# Patient Record
Sex: Female | Born: 1935 | Race: White | Hispanic: No | Marital: Married | Smoking: Never smoker
Health system: Southern US, Community
[De-identification: ages and names within clinical notes are randomized; demographics above are authoritative.]

## PROBLEM LIST (undated history)

## (undated) DIAGNOSIS — R202 Paresthesia of skin: Secondary | ICD-10-CM

## (undated) DIAGNOSIS — Z8719 Personal history of other diseases of the digestive system: Secondary | ICD-10-CM

## (undated) DIAGNOSIS — D500 Iron deficiency anemia secondary to blood loss (chronic): Secondary | ICD-10-CM

## (undated) DIAGNOSIS — I10 Essential (primary) hypertension: Secondary | ICD-10-CM

## (undated) DIAGNOSIS — K31819 Angiodysplasia of stomach and duodenum without bleeding: Secondary | ICD-10-CM

## (undated) DIAGNOSIS — I2510 Atherosclerotic heart disease of native coronary artery without angina pectoris: Secondary | ICD-10-CM

## (undated) DIAGNOSIS — A0472 Enterocolitis due to Clostridium difficile, not specified as recurrent: Secondary | ICD-10-CM

## (undated) DIAGNOSIS — Z860100 Personal history of colon polyps, unspecified: Secondary | ICD-10-CM

## (undated) DIAGNOSIS — L719 Rosacea, unspecified: Secondary | ICD-10-CM

## (undated) DIAGNOSIS — R7989 Other specified abnormal findings of blood chemistry: Secondary | ICD-10-CM

## (undated) DIAGNOSIS — N182 Chronic kidney disease, stage 2 (mild): Secondary | ICD-10-CM

## (undated) DIAGNOSIS — M797 Fibromyalgia: Secondary | ICD-10-CM

## (undated) DIAGNOSIS — G8929 Other chronic pain: Secondary | ICD-10-CM

## (undated) DIAGNOSIS — N6091 Unspecified benign mammary dysplasia of right breast: Secondary | ICD-10-CM

## (undated) DIAGNOSIS — K219 Gastro-esophageal reflux disease without esophagitis: Secondary | ICD-10-CM

## (undated) DIAGNOSIS — N809 Endometriosis, unspecified: Secondary | ICD-10-CM

## (undated) DIAGNOSIS — R0600 Dyspnea, unspecified: Secondary | ICD-10-CM

## (undated) DIAGNOSIS — G4700 Insomnia, unspecified: Secondary | ICD-10-CM

## (undated) DIAGNOSIS — Z9861 Coronary angioplasty status: Secondary | ICD-10-CM

## (undated) DIAGNOSIS — I219 Acute myocardial infarction, unspecified: Secondary | ICD-10-CM

## (undated) DIAGNOSIS — M1990 Unspecified osteoarthritis, unspecified site: Secondary | ICD-10-CM

## (undated) DIAGNOSIS — M3500 Sicca syndrome, unspecified: Secondary | ICD-10-CM

## (undated) DIAGNOSIS — I498 Other specified cardiac arrhythmias: Secondary | ICD-10-CM

## (undated) DIAGNOSIS — R002 Palpitations: Secondary | ICD-10-CM

## (undated) DIAGNOSIS — E785 Hyperlipidemia, unspecified: Secondary | ICD-10-CM

## (undated) DIAGNOSIS — G629 Polyneuropathy, unspecified: Secondary | ICD-10-CM

## (undated) DIAGNOSIS — K5792 Diverticulitis of intestine, part unspecified, without perforation or abscess without bleeding: Secondary | ICD-10-CM

## (undated) DIAGNOSIS — I7300 Raynaud's syndrome without gangrene: Secondary | ICD-10-CM

## (undated) DIAGNOSIS — M549 Dorsalgia, unspecified: Secondary | ICD-10-CM

## (undated) DIAGNOSIS — R9439 Abnormal result of other cardiovascular function study: Secondary | ICD-10-CM

## (undated) DIAGNOSIS — I214 Non-ST elevation (NSTEMI) myocardial infarction: Secondary | ICD-10-CM

## (undated) DIAGNOSIS — E559 Vitamin D deficiency, unspecified: Secondary | ICD-10-CM

## (undated) HISTORY — DX: Atherosclerotic heart disease of native coronary artery without angina pectoris: I25.10

## (undated) HISTORY — DX: Angiodysplasia of stomach and duodenum without bleeding: K31.819

## (undated) HISTORY — PX: APPENDECTOMY: SHX54

## (undated) HISTORY — DX: Unspecified osteoarthritis, unspecified site: M19.90

## (undated) HISTORY — DX: Coronary angioplasty status: Z98.61

## (undated) HISTORY — DX: Endometriosis, unspecified: N80.9

## (undated) HISTORY — PX: HERNIA REPAIR: SHX51

## (undated) HISTORY — PX: EYE SURGERY: SHX253

## (undated) HISTORY — DX: Essential (primary) hypertension: I10

## (undated) HISTORY — DX: Paresthesia of skin: R20.2

## (undated) HISTORY — DX: Unspecified benign mammary dysplasia of right breast: N60.91

## (undated) HISTORY — DX: Non-ST elevation (NSTEMI) myocardial infarction: I21.4

## (undated) HISTORY — DX: Enterocolitis due to Clostridium difficile, not specified as recurrent: A04.72

## (undated) HISTORY — PX: BREAST EXCISIONAL BIOPSY: SUR124

## (undated) HISTORY — DX: Hyperlipidemia, unspecified: E78.5

## (undated) HISTORY — DX: Raynaud's syndrome without gangrene: I73.00

## (undated) HISTORY — DX: Iron deficiency anemia secondary to blood loss (chronic): D50.0

## (undated) HISTORY — DX: Sjogren syndrome, unspecified: M35.00

## (undated) HISTORY — DX: Diverticulitis of intestine, part unspecified, without perforation or abscess without bleeding: K57.92

## (undated) HISTORY — DX: Rosacea, unspecified: L71.9

## (undated) HISTORY — DX: Other specified cardiac arrhythmias: I49.8

## (undated) HISTORY — DX: Personal history of colonic polyps: Z86.010

## (undated) HISTORY — DX: Gastro-esophageal reflux disease without esophagitis: K21.9

## (undated) HISTORY — PX: ABDOMINAL HYSTERECTOMY: SHX81

## (undated) HISTORY — DX: Palpitations: R00.2

## (undated) HISTORY — PX: LAPAROSCOPIC CHOLECYSTECTOMY: SUR755

## (undated) HISTORY — DX: Polyneuropathy, unspecified: G62.9

## (undated) HISTORY — DX: Vitamin D deficiency, unspecified: E55.9

## (undated) HISTORY — DX: Personal history of colon polyps, unspecified: Z86.0100

## (undated) HISTORY — PX: TEAR DUCT PROBING: SHX793

## (undated) HISTORY — DX: Chronic kidney disease, stage 2 (mild): N18.2

---

## 1997-09-26 ENCOUNTER — Ambulatory Visit (HOSPITAL_COMMUNITY): Admission: RE | Admit: 1997-09-26 | Discharge: 1997-09-26 | Payer: Self-pay | Admitting: *Deleted

## 1999-11-21 ENCOUNTER — Encounter: Payer: Self-pay | Admitting: Family Medicine

## 1999-11-21 ENCOUNTER — Ambulatory Visit (HOSPITAL_COMMUNITY): Admission: RE | Admit: 1999-11-21 | Discharge: 1999-11-21 | Payer: Self-pay | Admitting: Family Medicine

## 1999-11-27 ENCOUNTER — Encounter: Payer: Self-pay | Admitting: Family Medicine

## 1999-11-27 ENCOUNTER — Encounter: Admission: RE | Admit: 1999-11-27 | Discharge: 1999-11-27 | Payer: Self-pay | Admitting: Family Medicine

## 2000-04-21 ENCOUNTER — Ambulatory Visit (HOSPITAL_COMMUNITY): Admission: RE | Admit: 2000-04-21 | Discharge: 2000-04-21 | Payer: Self-pay | Admitting: Family Medicine

## 2000-04-21 ENCOUNTER — Encounter: Payer: Self-pay | Admitting: Family Medicine

## 2000-10-07 ENCOUNTER — Encounter: Payer: Self-pay | Admitting: Family Medicine

## 2000-10-07 ENCOUNTER — Encounter: Admission: RE | Admit: 2000-10-07 | Discharge: 2000-10-07 | Payer: Self-pay | Admitting: Family Medicine

## 2000-10-29 ENCOUNTER — Encounter: Admission: RE | Admit: 2000-10-29 | Discharge: 2000-10-29 | Payer: Self-pay | Admitting: Family Medicine

## 2000-10-29 ENCOUNTER — Encounter: Payer: Self-pay | Admitting: Family Medicine

## 2001-05-19 ENCOUNTER — Encounter: Admission: RE | Admit: 2001-05-19 | Discharge: 2001-05-19 | Payer: Self-pay

## 2003-01-06 ENCOUNTER — Ambulatory Visit (HOSPITAL_COMMUNITY): Admission: RE | Admit: 2003-01-06 | Discharge: 2003-01-06 | Payer: Self-pay | Admitting: Gastroenterology

## 2003-03-18 HISTORY — PX: HIATAL HERNIA REPAIR: SHX195

## 2003-05-29 ENCOUNTER — Encounter: Admission: RE | Admit: 2003-05-29 | Discharge: 2003-05-29 | Payer: Self-pay | Admitting: Gastroenterology

## 2003-09-26 ENCOUNTER — Other Ambulatory Visit: Admission: RE | Admit: 2003-09-26 | Discharge: 2003-09-26 | Payer: Self-pay | Admitting: Family Medicine

## 2003-11-07 ENCOUNTER — Ambulatory Visit (HOSPITAL_COMMUNITY): Admission: RE | Admit: 2003-11-07 | Discharge: 2003-11-07 | Payer: Self-pay | Admitting: Internal Medicine

## 2004-03-17 HISTORY — PX: CARDIAC CATHETERIZATION: SHX172

## 2004-11-13 ENCOUNTER — Ambulatory Visit: Payer: Self-pay | Admitting: *Deleted

## 2004-11-21 ENCOUNTER — Ambulatory Visit: Payer: Self-pay

## 2004-11-22 ENCOUNTER — Ambulatory Visit (HOSPITAL_COMMUNITY): Admission: RE | Admit: 2004-11-22 | Discharge: 2004-11-22 | Payer: Self-pay | Admitting: Family Medicine

## 2004-12-10 ENCOUNTER — Ambulatory Visit: Payer: Self-pay | Admitting: Cardiology

## 2004-12-13 ENCOUNTER — Ambulatory Visit: Payer: Self-pay | Admitting: Cardiology

## 2004-12-26 ENCOUNTER — Ambulatory Visit (HOSPITAL_COMMUNITY): Admission: RE | Admit: 2004-12-26 | Discharge: 2004-12-26 | Payer: Self-pay | Admitting: Cardiology

## 2005-01-07 ENCOUNTER — Ambulatory Visit: Payer: Self-pay | Admitting: Cardiology

## 2005-01-07 ENCOUNTER — Ambulatory Visit (HOSPITAL_COMMUNITY): Admission: RE | Admit: 2005-01-07 | Discharge: 2005-01-07 | Payer: Self-pay | Admitting: Cardiology

## 2005-01-14 ENCOUNTER — Ambulatory Visit: Payer: Self-pay | Admitting: Cardiology

## 2005-02-03 ENCOUNTER — Ambulatory Visit: Payer: Self-pay | Admitting: Cardiology

## 2005-03-03 ENCOUNTER — Ambulatory Visit: Payer: Self-pay | Admitting: Cardiology

## 2005-03-26 ENCOUNTER — Ambulatory Visit: Payer: Self-pay | Admitting: Cardiology

## 2005-04-14 ENCOUNTER — Encounter (INDEPENDENT_AMBULATORY_CARE_PROVIDER_SITE_OTHER): Payer: Self-pay | Admitting: *Deleted

## 2005-04-14 ENCOUNTER — Ambulatory Visit (HOSPITAL_COMMUNITY): Admission: RE | Admit: 2005-04-14 | Discharge: 2005-04-15 | Payer: Self-pay | Admitting: Surgery

## 2005-05-07 ENCOUNTER — Ambulatory Visit: Payer: Self-pay | Admitting: Cardiology

## 2005-09-15 ENCOUNTER — Ambulatory Visit: Payer: Self-pay | Admitting: Cardiology

## 2005-11-12 ENCOUNTER — Ambulatory Visit: Payer: Self-pay | Admitting: Cardiology

## 2005-11-20 ENCOUNTER — Ambulatory Visit: Payer: Self-pay | Admitting: Cardiology

## 2005-12-11 ENCOUNTER — Ambulatory Visit: Payer: Self-pay | Admitting: Cardiology

## 2005-12-13 ENCOUNTER — Ambulatory Visit: Payer: Self-pay | Admitting: Cardiology

## 2005-12-13 ENCOUNTER — Inpatient Hospital Stay (HOSPITAL_COMMUNITY): Admission: EM | Admit: 2005-12-13 | Discharge: 2005-12-14 | Payer: Self-pay | Admitting: Emergency Medicine

## 2006-02-13 ENCOUNTER — Ambulatory Visit: Payer: Self-pay | Admitting: Cardiology

## 2006-04-17 ENCOUNTER — Ambulatory Visit: Payer: Self-pay | Admitting: Cardiology

## 2006-07-21 ENCOUNTER — Ambulatory Visit: Payer: Self-pay | Admitting: Cardiology

## 2006-07-21 LAB — CONVERTED CEMR LAB
Basophils Absolute: 0 10*3/uL (ref 0.0–0.1)
Basophils Relative: 0 % (ref 0.0–1.0)
Eosinophils Absolute: 0.3 10*3/uL (ref 0.0–0.6)
Eosinophils Relative: 4.1 % (ref 0.0–5.0)
HCT: 30.9 % — ABNORMAL LOW (ref 36.0–46.0)
Hemoglobin: 10.4 g/dL — ABNORMAL LOW (ref 12.0–15.0)
Lymphocytes Relative: 30.3 % (ref 12.0–46.0)
MCHC: 33.5 g/dL (ref 30.0–36.0)
MCV: 77.6 fL — ABNORMAL LOW (ref 78.0–100.0)
Monocytes Absolute: 0.3 10*3/uL (ref 0.2–0.7)
Monocytes Relative: 4.3 % (ref 3.0–11.0)
Neutro Abs: 4 10*3/uL (ref 1.4–7.7)
Neutrophils Relative %: 61.3 % (ref 43.0–77.0)
Platelets: 230 10*3/uL (ref 150–400)
Pro B Natriuretic peptide (BNP): 45 pg/mL (ref 0.0–100.0)
RBC: 3.98 M/uL (ref 3.87–5.11)
RDW: 15.6 % — ABNORMAL HIGH (ref 11.5–14.6)
WBC: 6.6 10*3/uL (ref 4.5–10.5)

## 2006-08-11 ENCOUNTER — Ambulatory Visit: Payer: Self-pay

## 2006-09-10 ENCOUNTER — Ambulatory Visit: Payer: Self-pay | Admitting: Cardiology

## 2006-09-22 ENCOUNTER — Encounter: Admission: RE | Admit: 2006-09-22 | Discharge: 2006-09-22 | Payer: Self-pay | Admitting: Family Medicine

## 2006-11-02 ENCOUNTER — Ambulatory Visit: Payer: Self-pay | Admitting: Cardiology

## 2006-11-09 ENCOUNTER — Ambulatory Visit: Payer: Self-pay | Admitting: Internal Medicine

## 2006-11-09 LAB — CONVERTED CEMR LAB
Basophils Absolute: 0.1 10*3/uL (ref 0.0–0.1)
Basophils Relative: 0.9 % (ref 0.0–1.0)
Eosinophils Absolute: 0.2 10*3/uL (ref 0.0–0.6)
Eosinophils Relative: 4.2 % (ref 0.0–5.0)
Ferritin: 13.7 ng/mL (ref 10.0–291.0)
HCT: 34.8 % — ABNORMAL LOW (ref 36.0–46.0)
Hemoglobin: 11.3 g/dL — ABNORMAL LOW (ref 12.0–15.0)
IgA: 186 mg/dL (ref 68–378)
Lymphocytes Relative: 22 % (ref 12.0–46.0)
MCHC: 32.5 g/dL (ref 30.0–36.0)
MCV: 79.1 fL (ref 78.0–100.0)
Monocytes Absolute: 0.6 10*3/uL (ref 0.2–0.7)
Monocytes Relative: 9.9 % (ref 3.0–11.0)
Neutro Abs: 3.7 10*3/uL (ref 1.4–7.7)
Neutrophils Relative %: 63 % (ref 43.0–77.0)
Platelets: 214 10*3/uL (ref 150–400)
RBC: 4.39 M/uL (ref 3.87–5.11)
RDW: 16.2 % — ABNORMAL HIGH (ref 11.5–14.6)
Tissue Transglutaminase Ab, IgA: 0.1 units (ref <7)
WBC: 5.9 10*3/uL (ref 4.5–10.5)

## 2006-12-17 ENCOUNTER — Encounter: Payer: Self-pay | Admitting: Internal Medicine

## 2006-12-17 ENCOUNTER — Ambulatory Visit: Payer: Self-pay | Admitting: Internal Medicine

## 2006-12-17 HISTORY — PX: COLONOSCOPY W/ BIOPSIES AND POLYPECTOMY: SHX1376

## 2007-02-02 ENCOUNTER — Ambulatory Visit: Payer: Self-pay | Admitting: Internal Medicine

## 2007-02-02 LAB — CONVERTED CEMR LAB
Basophils Absolute: 0 10*3/uL (ref 0.0–0.1)
Basophils Relative: 0.8 % (ref 0.0–1.0)
Eosinophils Absolute: 0.2 10*3/uL (ref 0.0–0.6)
Eosinophils Relative: 2.9 % (ref 0.0–5.0)
Ferritin: 15.9 ng/mL (ref 10.0–291.0)
HCT: 33 % — ABNORMAL LOW (ref 36.0–46.0)
Hemoglobin: 10.7 g/dL — ABNORMAL LOW (ref 12.0–15.0)
Lymphocytes Relative: 23.3 % (ref 12.0–46.0)
MCHC: 32.5 g/dL (ref 30.0–36.0)
MCV: 82.3 fL (ref 78.0–100.0)
Monocytes Absolute: 0.7 10*3/uL (ref 0.2–0.7)
Monocytes Relative: 12 % — ABNORMAL HIGH (ref 3.0–11.0)
Neutro Abs: 3.3 10*3/uL (ref 1.4–7.7)
Neutrophils Relative %: 61 % (ref 43.0–77.0)
Platelets: 212 10*3/uL (ref 150–400)
RBC: 4.01 M/uL (ref 3.87–5.11)
RDW: 15.2 % — ABNORMAL HIGH (ref 11.5–14.6)
WBC: 5.5 10*3/uL (ref 4.5–10.5)

## 2007-02-23 ENCOUNTER — Ambulatory Visit: Payer: Self-pay | Admitting: Cardiology

## 2007-04-20 ENCOUNTER — Ambulatory Visit: Payer: Self-pay | Admitting: Internal Medicine

## 2007-04-20 DIAGNOSIS — M1990 Unspecified osteoarthritis, unspecified site: Secondary | ICD-10-CM | POA: Insufficient documentation

## 2007-04-20 DIAGNOSIS — Z860100 Personal history of colon polyps, unspecified: Secondary | ICD-10-CM | POA: Insufficient documentation

## 2007-04-20 DIAGNOSIS — K219 Gastro-esophageal reflux disease without esophagitis: Secondary | ICD-10-CM | POA: Insufficient documentation

## 2007-04-20 DIAGNOSIS — K31819 Angiodysplasia of stomach and duodenum without bleeding: Secondary | ICD-10-CM | POA: Insufficient documentation

## 2007-04-20 DIAGNOSIS — D649 Anemia, unspecified: Secondary | ICD-10-CM

## 2007-04-20 DIAGNOSIS — L719 Rosacea, unspecified: Secondary | ICD-10-CM

## 2007-04-20 DIAGNOSIS — D509 Iron deficiency anemia, unspecified: Secondary | ICD-10-CM | POA: Insufficient documentation

## 2007-04-20 DIAGNOSIS — E785 Hyperlipidemia, unspecified: Secondary | ICD-10-CM

## 2007-04-20 DIAGNOSIS — Z9861 Coronary angioplasty status: Secondary | ICD-10-CM | POA: Insufficient documentation

## 2007-04-20 DIAGNOSIS — Z9189 Other specified personal risk factors, not elsewhere classified: Secondary | ICD-10-CM | POA: Insufficient documentation

## 2007-04-20 DIAGNOSIS — I4510 Unspecified right bundle-branch block: Secondary | ICD-10-CM | POA: Insufficient documentation

## 2007-04-20 DIAGNOSIS — I2510 Atherosclerotic heart disease of native coronary artery without angina pectoris: Secondary | ICD-10-CM

## 2007-04-20 DIAGNOSIS — I7300 Raynaud's syndrome without gangrene: Secondary | ICD-10-CM | POA: Insufficient documentation

## 2007-04-20 DIAGNOSIS — N809 Endometriosis, unspecified: Secondary | ICD-10-CM | POA: Insufficient documentation

## 2007-04-20 HISTORY — DX: Atherosclerotic heart disease of native coronary artery without angina pectoris: I25.10

## 2007-04-20 HISTORY — DX: Atherosclerotic heart disease of native coronary artery without angina pectoris: Z98.61

## 2007-04-20 LAB — CONVERTED CEMR LAB
Basophils Absolute: 0.1 10*3/uL (ref 0.0–0.1)
Basophils Relative: 1.1 % — ABNORMAL HIGH (ref 0.0–1.0)
Eosinophils Absolute: 0.2 10*3/uL (ref 0.0–0.6)
Eosinophils Relative: 3 % (ref 0.0–5.0)
Ferritin: 23.7 ng/mL (ref 10.0–291.0)
HCT: 36.5 % (ref 36.0–46.0)
Hemoglobin: 11.9 g/dL — ABNORMAL LOW (ref 12.0–15.0)
Lymphocytes Relative: 22.4 % (ref 12.0–46.0)
MCHC: 32.7 g/dL (ref 30.0–36.0)
MCV: 82.4 fL (ref 78.0–100.0)
Monocytes Absolute: 0.8 10*3/uL — ABNORMAL HIGH (ref 0.2–0.7)
Monocytes Relative: 11.6 % — ABNORMAL HIGH (ref 3.0–11.0)
Neutro Abs: 4.1 10*3/uL (ref 1.4–7.7)
Neutrophils Relative %: 61.9 % (ref 43.0–77.0)
Platelets: 209 10*3/uL (ref 150–400)
RBC: 4.43 M/uL (ref 3.87–5.11)
RDW: 14.8 % — ABNORMAL HIGH (ref 11.5–14.6)
WBC: 6.7 10*3/uL (ref 4.5–10.5)

## 2007-04-29 ENCOUNTER — Ambulatory Visit: Payer: Self-pay | Admitting: Cardiology

## 2007-07-14 ENCOUNTER — Ambulatory Visit: Payer: Self-pay | Admitting: Cardiology

## 2007-08-10 ENCOUNTER — Ambulatory Visit: Payer: Self-pay | Admitting: Cardiology

## 2007-11-02 ENCOUNTER — Encounter: Payer: Self-pay | Admitting: Cardiology

## 2007-11-02 ENCOUNTER — Encounter: Payer: Self-pay | Admitting: Internal Medicine

## 2007-11-10 ENCOUNTER — Ambulatory Visit: Payer: Self-pay | Admitting: Cardiology

## 2007-11-10 LAB — CONVERTED CEMR LAB
Basophils Absolute: 0 10*3/uL (ref 0.0–0.1)
Basophils Relative: 0.7 % (ref 0.0–3.0)
Eosinophils Absolute: 0.1 10*3/uL (ref 0.0–0.7)
Eosinophils Relative: 2.3 % (ref 0.0–5.0)
HCT: 36.8 % (ref 36.0–46.0)
Hemoglobin: 12.2 g/dL (ref 12.0–15.0)
Lymphocytes Relative: 23 % (ref 12.0–46.0)
MCHC: 33.3 g/dL (ref 30.0–36.0)
MCV: 85.5 fL (ref 78.0–100.0)
Monocytes Absolute: 0.7 10*3/uL (ref 0.1–1.0)
Monocytes Relative: 11.2 % (ref 3.0–12.0)
Neutro Abs: 4.2 10*3/uL (ref 1.4–7.7)
Neutrophils Relative %: 62.8 % (ref 43.0–77.0)
Platelets: 237 10*3/uL (ref 150–400)
RBC: 4.31 M/uL (ref 3.87–5.11)
RDW: 16 % — ABNORMAL HIGH (ref 11.5–14.6)
Sed Rate: 13 mm/hr (ref 0–22)
TSH: 1.26 microintl units/mL (ref 0.35–5.50)
WBC: 6.5 10*3/uL (ref 4.5–10.5)

## 2007-12-13 ENCOUNTER — Ambulatory Visit: Payer: Self-pay | Admitting: Cardiology

## 2007-12-22 ENCOUNTER — Encounter: Admission: RE | Admit: 2007-12-22 | Discharge: 2007-12-22 | Payer: Self-pay | Admitting: Family Medicine

## 2008-01-12 ENCOUNTER — Ambulatory Visit: Payer: Self-pay | Admitting: Cardiology

## 2008-02-29 ENCOUNTER — Encounter: Payer: Self-pay | Admitting: Cardiology

## 2008-03-08 ENCOUNTER — Ambulatory Visit: Payer: Self-pay | Admitting: Cardiology

## 2008-05-17 ENCOUNTER — Encounter (HOSPITAL_COMMUNITY): Admission: RE | Admit: 2008-05-17 | Discharge: 2008-08-15 | Payer: Self-pay | Admitting: Cardiology

## 2008-06-24 DIAGNOSIS — I498 Other specified cardiac arrhythmias: Secondary | ICD-10-CM

## 2008-06-27 ENCOUNTER — Encounter: Payer: Self-pay | Admitting: Cardiology

## 2008-06-27 ENCOUNTER — Ambulatory Visit: Payer: Self-pay | Admitting: Cardiology

## 2008-07-04 ENCOUNTER — Encounter: Admission: RE | Admit: 2008-07-04 | Discharge: 2008-07-04 | Payer: Self-pay | Admitting: Family Medicine

## 2008-07-11 ENCOUNTER — Ambulatory Visit: Payer: Self-pay | Admitting: Internal Medicine

## 2008-07-14 ENCOUNTER — Encounter: Payer: Self-pay | Admitting: Cardiology

## 2008-07-19 ENCOUNTER — Encounter: Payer: Self-pay | Admitting: Cardiology

## 2008-07-23 ENCOUNTER — Encounter: Payer: Self-pay | Admitting: Cardiology

## 2008-07-25 ENCOUNTER — Encounter: Payer: Self-pay | Admitting: Cardiology

## 2008-11-28 ENCOUNTER — Ambulatory Visit: Payer: Self-pay | Admitting: Cardiology

## 2008-12-04 ENCOUNTER — Ambulatory Visit: Payer: Self-pay | Admitting: Cardiology

## 2008-12-13 LAB — CONVERTED CEMR LAB: Total CK: 125 units/L (ref 7–177)

## 2009-02-01 ENCOUNTER — Encounter: Admission: RE | Admit: 2009-02-01 | Discharge: 2009-02-01 | Payer: Self-pay | Admitting: Family Medicine

## 2009-02-25 ENCOUNTER — Telehealth: Payer: Self-pay | Admitting: Internal Medicine

## 2009-03-15 ENCOUNTER — Ambulatory Visit: Payer: Self-pay | Admitting: Cardiology

## 2009-03-15 ENCOUNTER — Inpatient Hospital Stay (HOSPITAL_COMMUNITY): Admission: EM | Admit: 2009-03-15 | Discharge: 2009-03-20 | Payer: Self-pay | Admitting: Emergency Medicine

## 2009-03-19 ENCOUNTER — Encounter: Payer: Self-pay | Admitting: Cardiology

## 2009-03-20 ENCOUNTER — Encounter: Payer: Self-pay | Admitting: Cardiology

## 2009-03-26 ENCOUNTER — Telehealth: Payer: Self-pay | Admitting: Cardiology

## 2009-03-26 ENCOUNTER — Ambulatory Visit: Payer: Self-pay | Admitting: Cardiology

## 2009-03-28 ENCOUNTER — Telehealth (INDEPENDENT_AMBULATORY_CARE_PROVIDER_SITE_OTHER): Payer: Self-pay | Admitting: *Deleted

## 2009-03-28 LAB — CONVERTED CEMR LAB
BUN: 14 mg/dL (ref 6–23)
Basophils Absolute: 0 10*3/uL (ref 0.0–0.1)
Basophils Relative: 0.7 % (ref 0.0–3.0)
CO2: 27 meq/L (ref 19–32)
Calcium: 9.3 mg/dL (ref 8.4–10.5)
Chloride: 107 meq/L (ref 96–112)
Creatinine, Ser: 0.9 mg/dL (ref 0.4–1.2)
Eosinophils Absolute: 0.1 10*3/uL (ref 0.0–0.7)
Eosinophils Relative: 2.1 % (ref 0.0–5.0)
GFR calc non Af Amer: 65.58 mL/min (ref >60)
Glucose, Bld: 89 mg/dL (ref 70–99)
HCT: 34.2 % — ABNORMAL LOW (ref 36.0–46.0)
Hemoglobin: 10.9 g/dL — ABNORMAL LOW (ref 12.0–15.0)
Lymphocytes Relative: 23 % (ref 12.0–46.0)
Lymphs Abs: 1.3 10*3/uL (ref 0.7–4.0)
MCHC: 32 g/dL (ref 30.0–36.0)
MCV: 87.5 fL (ref 78.0–100.0)
Monocytes Absolute: 0.8 10*3/uL (ref 0.1–1.0)
Monocytes Relative: 13.6 % — ABNORMAL HIGH (ref 3.0–12.0)
Neutro Abs: 3.4 10*3/uL (ref 1.4–7.7)
Neutrophils Relative %: 60.6 % (ref 43.0–77.0)
Platelets: 231 10*3/uL (ref 150.0–400.0)
Potassium: 4.7 meq/L (ref 3.5–5.1)
RBC: 3.9 M/uL (ref 3.87–5.11)
RDW: 14.7 % — ABNORMAL HIGH (ref 11.5–14.6)
Sodium: 140 meq/L (ref 135–145)
WBC: 5.6 10*3/uL (ref 4.5–10.5)

## 2009-03-29 ENCOUNTER — Ambulatory Visit: Payer: Self-pay | Admitting: Cardiology

## 2009-03-29 ENCOUNTER — Encounter (HOSPITAL_COMMUNITY): Admission: RE | Admit: 2009-03-29 | Discharge: 2009-05-21 | Payer: Self-pay | Admitting: Cardiology

## 2009-03-29 ENCOUNTER — Ambulatory Visit: Payer: Self-pay

## 2009-04-02 ENCOUNTER — Ambulatory Visit: Payer: Self-pay | Admitting: Cardiology

## 2009-04-10 ENCOUNTER — Encounter: Admission: RE | Admit: 2009-04-10 | Discharge: 2009-04-10 | Payer: Self-pay | Admitting: Family Medicine

## 2009-04-16 ENCOUNTER — Encounter (INDEPENDENT_AMBULATORY_CARE_PROVIDER_SITE_OTHER): Payer: Self-pay | Admitting: *Deleted

## 2009-04-16 ENCOUNTER — Ambulatory Visit: Payer: Self-pay | Admitting: Internal Medicine

## 2009-04-16 DIAGNOSIS — M3500 Sicca syndrome, unspecified: Secondary | ICD-10-CM | POA: Insufficient documentation

## 2009-04-24 ENCOUNTER — Ambulatory Visit: Payer: Self-pay | Admitting: Cardiology

## 2009-04-27 ENCOUNTER — Telehealth: Payer: Self-pay | Admitting: Internal Medicine

## 2009-04-30 LAB — CONVERTED CEMR LAB
Basophils Absolute: 0 10*3/uL (ref 0.0–0.1)
Basophils Relative: 0.7 % (ref 0.0–3.0)
Eosinophils Absolute: 0.2 10*3/uL (ref 0.0–0.7)
Eosinophils Relative: 2.6 % (ref 0.0–5.0)
HCT: 33.5 % — ABNORMAL LOW (ref 36.0–46.0)
Hemoglobin: 11 g/dL — ABNORMAL LOW (ref 12.0–15.0)
Lymphocytes Relative: 23 % (ref 12.0–46.0)
Lymphs Abs: 1.3 10*3/uL (ref 0.7–4.0)
MCHC: 32.7 g/dL (ref 30.0–36.0)
MCV: 85.2 fL (ref 78.0–100.0)
Monocytes Absolute: 0.6 10*3/uL (ref 0.1–1.0)
Monocytes Relative: 11 % (ref 3.0–12.0)
Neutro Abs: 3.7 10*3/uL (ref 1.4–7.7)
Neutrophils Relative %: 62.7 % (ref 43.0–77.0)
Platelets: 244 10*3/uL (ref 150.0–400.0)
RBC: 3.94 M/uL (ref 3.87–5.11)
RDW: 14.8 % — ABNORMAL HIGH (ref 11.5–14.6)
WBC: 5.8 10*3/uL (ref 4.5–10.5)

## 2009-05-01 ENCOUNTER — Ambulatory Visit: Payer: Self-pay | Admitting: Internal Medicine

## 2009-05-01 ENCOUNTER — Ambulatory Visit (HOSPITAL_COMMUNITY): Admission: RE | Admit: 2009-05-01 | Discharge: 2009-05-01 | Payer: Self-pay | Admitting: Internal Medicine

## 2009-05-02 ENCOUNTER — Encounter (INDEPENDENT_AMBULATORY_CARE_PROVIDER_SITE_OTHER): Payer: Self-pay

## 2009-05-07 ENCOUNTER — Telehealth: Payer: Self-pay | Admitting: Internal Medicine

## 2009-05-11 ENCOUNTER — Telehealth: Payer: Self-pay | Admitting: Internal Medicine

## 2009-05-24 ENCOUNTER — Ambulatory Visit: Payer: Self-pay | Admitting: Internal Medicine

## 2009-05-29 ENCOUNTER — Ambulatory Visit (HOSPITAL_COMMUNITY): Admission: RE | Admit: 2009-05-29 | Discharge: 2009-05-29 | Payer: Self-pay | Admitting: Internal Medicine

## 2009-05-29 ENCOUNTER — Ambulatory Visit: Payer: Self-pay | Admitting: Internal Medicine

## 2009-05-31 ENCOUNTER — Ambulatory Visit: Payer: Self-pay | Admitting: Internal Medicine

## 2009-05-31 ENCOUNTER — Telehealth: Payer: Self-pay | Admitting: Internal Medicine

## 2009-05-31 LAB — CONVERTED CEMR LAB
Basophils Absolute: 0 10*3/uL (ref 0.0–0.1)
Basophils Relative: 0.6 % (ref 0.0–3.0)
Eosinophils Absolute: 0.1 10*3/uL (ref 0.0–0.7)
Eosinophils Relative: 1.8 % (ref 0.0–5.0)
HCT: 36.1 % (ref 36.0–46.0)
Hemoglobin: 11.7 g/dL — ABNORMAL LOW (ref 12.0–15.0)
Lymphocytes Relative: 18.5 % (ref 12.0–46.0)
Lymphs Abs: 1.5 10*3/uL (ref 0.7–4.0)
MCHC: 32.4 g/dL (ref 30.0–36.0)
MCV: 85.3 fL (ref 78.0–100.0)
Monocytes Absolute: 0.7 10*3/uL (ref 0.1–1.0)
Monocytes Relative: 8.3 % (ref 3.0–12.0)
Neutro Abs: 5.7 10*3/uL (ref 1.4–7.7)
Neutrophils Relative %: 70.8 % (ref 43.0–77.0)
Platelets: 215 10*3/uL (ref 150.0–400.0)
RBC: 4.23 M/uL (ref 3.87–5.11)
RDW: 14 % (ref 11.5–14.6)
WBC: 8 10*3/uL (ref 4.5–10.5)

## 2009-06-13 ENCOUNTER — Ambulatory Visit: Payer: Self-pay | Admitting: Internal Medicine

## 2009-06-25 ENCOUNTER — Ambulatory Visit: Payer: Self-pay | Admitting: Cardiology

## 2009-07-23 ENCOUNTER — Telehealth: Payer: Self-pay | Admitting: Internal Medicine

## 2009-07-30 ENCOUNTER — Ambulatory Visit: Payer: Self-pay | Admitting: Internal Medicine

## 2009-07-31 LAB — CONVERTED CEMR LAB
Basophils Absolute: 0 10*3/uL (ref 0.0–0.1)
Basophils Relative: 0.6 % (ref 0.0–3.0)
Eosinophils Absolute: 0.2 10*3/uL (ref 0.0–0.7)
Eosinophils Relative: 2.5 % (ref 0.0–5.0)
HCT: 36.6 % (ref 36.0–46.0)
Hemoglobin: 12.1 g/dL (ref 12.0–15.0)
Lymphocytes Relative: 23 % (ref 12.0–46.0)
Lymphs Abs: 1.4 10*3/uL (ref 0.7–4.0)
MCHC: 33.2 g/dL (ref 30.0–36.0)
MCV: 83.5 fL (ref 78.0–100.0)
Monocytes Absolute: 0.7 10*3/uL (ref 0.1–1.0)
Monocytes Relative: 11.8 % (ref 3.0–12.0)
Neutro Abs: 3.8 10*3/uL (ref 1.4–7.7)
Neutrophils Relative %: 62.1 % (ref 43.0–77.0)
Platelets: 206 10*3/uL (ref 150.0–400.0)
RBC: 4.38 M/uL (ref 3.87–5.11)
RDW: 16.4 % — ABNORMAL HIGH (ref 11.5–14.6)
WBC: 6.1 10*3/uL (ref 4.5–10.5)

## 2009-08-03 ENCOUNTER — Encounter (INDEPENDENT_AMBULATORY_CARE_PROVIDER_SITE_OTHER): Payer: Self-pay | Admitting: *Deleted

## 2009-08-23 ENCOUNTER — Encounter: Payer: Self-pay | Admitting: Internal Medicine

## 2009-09-20 ENCOUNTER — Telehealth: Payer: Self-pay | Admitting: Cardiology

## 2009-09-21 ENCOUNTER — Encounter: Payer: Self-pay | Admitting: Internal Medicine

## 2009-09-27 ENCOUNTER — Encounter: Payer: Self-pay | Admitting: Internal Medicine

## 2009-11-14 ENCOUNTER — Ambulatory Visit: Payer: Self-pay | Admitting: Cardiology

## 2009-11-27 ENCOUNTER — Encounter: Payer: Self-pay | Admitting: Internal Medicine

## 2009-11-27 HISTORY — PX: ESOPHAGOGASTRODUODENOSCOPY: SHX1529

## 2010-02-04 ENCOUNTER — Encounter: Payer: Self-pay | Admitting: Cardiology

## 2010-02-04 ENCOUNTER — Encounter: Payer: Self-pay | Admitting: Internal Medicine

## 2010-03-14 ENCOUNTER — Telehealth: Payer: Self-pay | Admitting: Internal Medicine

## 2010-03-22 ENCOUNTER — Ambulatory Visit: Admit: 2010-03-22 | Payer: Self-pay | Admitting: Cardiology

## 2010-04-06 ENCOUNTER — Encounter: Payer: Self-pay | Admitting: Family Medicine

## 2010-04-06 ENCOUNTER — Encounter: Payer: Self-pay | Admitting: Gastroenterology

## 2010-04-07 ENCOUNTER — Encounter: Payer: Self-pay | Admitting: Family Medicine

## 2010-04-14 LAB — CONVERTED CEMR LAB
Basophils Absolute: 0 10*3/uL (ref 0.0–0.1)
Basophils Relative: 0.7 % (ref 0.0–3.0)
Eosinophils Absolute: 0.1 10*3/uL (ref 0.0–0.7)
Eosinophils Relative: 1.9 % (ref 0.0–5.0)
HCT: 39 % (ref 36.0–46.0)
Hemoglobin: 12.8 g/dL (ref 12.0–15.0)
Lymphocytes Relative: 25.7 % (ref 12.0–46.0)
Lymphs Abs: 1.4 10*3/uL (ref 0.7–4.0)
MCHC: 32.8 g/dL (ref 30.0–36.0)
MCV: 85.4 fL (ref 78.0–100.0)
Monocytes Absolute: 0.7 10*3/uL (ref 0.1–1.0)
Monocytes Relative: 12 % (ref 3.0–12.0)
Neutro Abs: 3.4 10*3/uL (ref 1.4–7.7)
Neutrophils Relative %: 59.7 % (ref 43.0–77.0)
Platelets: 209 10*3/uL (ref 150.0–400.0)
RBC: 4.57 M/uL (ref 3.87–5.11)
RDW: 14.2 % (ref 11.5–14.6)
WBC: 5.6 10*3/uL (ref 4.5–10.5)

## 2010-04-18 NOTE — Letter (Signed)
Summary: GI/[facility]  GI/[facility]   Imported By: Lester Sanford 09/18/2009 12:17:08  _____________________________________________________________________  External Attachment:    Type:   Image     Comment:   External Document

## 2010-04-18 NOTE — Miscellaneous (Signed)
Summary: schedule EGD  Clinical Lists Changes  Orders: Added new Test order of ZEGD/APC (ZEGD/APC) - Signed

## 2010-04-18 NOTE — Assessment & Plan Note (Signed)
Summary: gi bleed...em   History of Present Illness Visit Type: Follow-up Visit Primary GI MD: Stan Head MD [facility] Primary Provider: Ollen Gross Chief Complaint: GI bleeding History of Present Illness:   ZE75-year-old white woman with chronic recurrent bleeding from the gastrointestinal tract. She has gastric antral vascular ectasia. She was recently admitted to the hospital with chest pain, and initial hemoglobin was 12. It fell into the 9 range and even IE range. She was given heparin and didn't see melena. She was managed conservatively. She is here now to discuss further evaluation and treatment. I have previously recommended ablation of her gastric antral vascular Aphagia. Her cardiologist, Dr. Riley Kill, hasindicated that it intervention with PTCA, would be problematic given her propensity to bleed. She says she is now ready to proceed with ablation of this problem. During the admit she had some visual changes and Dr. Sharene Skeans ? variant migraies. She was recently diagnosed with  Sjogren's by Dr. Tawni Millers. She still has dysphagia, dry mouth, etc.   GI Review of Systems      Denies abdominal pain, acid reflux, belching, bloating, chest pain, dysphagia with liquids, dysphagia with solids, heartburn, loss of appetite, nausea, vomiting, vomiting blood, weight loss, and  weight gain.      Reports constipation, diarrhea, and  rectal bleeding.     Denies anal fissure, black tarry stools, change in bowel habit, diverticulosis, fecal incontinence, heme positive stool, hemorrhoids, irritable bowel syndrome, jaundice, light color stool, liver problems, and  rectal pain.    Current Medications (verified): 1)  Norvasc 5 Mg Tabs (Amlodipine Besylate) .... Take 1 Tablet By Mouth Once A Day 2)  Omeprazole 20 Mg Cpdr (Omeprazole) .... Take One Tablet By Mouth Once Daily. 3)  Glucosamine Chondr 1500 Complx  Caps (Glucosamine-Chondroit-Vit C-Mn) .... Take 1/2 Cap Daily 4)  Mone Vie Juice .[name].. 2 Oz.  Daily 5)  Tylenol Extra Strength 500 Mg Tabs (Acetaminophen) .... As Needed 6)  Temazepam 15 Mg Caps (Temazepam) .... As Needed 7)  Claritin 10 Mg Tabs (Loratadine) .... As Needed 8)  Cvs Iron 325 (65 Fe) Mg Tabs (Ferrous Sulfate) .[name].. 1-2 By Mouth Once Daily 9)  Grape Seed Extract 100 Mg Caps (Grape Seed) .... Take One Tablet By Mouth Once Daily. 10)  Nitrostat 0.4 Mg Subl (Nitroglycerin) .[name].. 1 Tablet Under Tongue At Onset of Chest Pain; You May Repeat Every 5 Minutes For Up To 3 Doses. 11)  Metoprolol Tartrate 25 Mg Tabs (Metoprolol Tartrate) .... Take One Tablet By Mouth Twice A Day 12)  Nystatin 100000 Unit/ml Susp (Nystatin) .... As Needed 13)  Hydrocodone-Homatropine 5-1.5 Mg/13ml Syrp (Hydrocodone-Homatropine) .... Take 1 Teaspoon At Bedtime 14)  Lopressor 50 Mg Tabs (Metoprolol Tartrate) .... Two Times A Day  Allergies (verified): 1)  ! Crestor (Rosuvastatin Calcium) 2)  ! Vytorin (Ezetimibe-Simvastatin) 3)  ! Zetia (Ezetimibe) 4)  ! 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 Cholecystectomy for gallbladder dyskinesia, 2007([facility]) Bilateral oopherectomy Left breast lumpectomy Tear duct surgery Total Abdominal Hysterectomy Paraesophageal and hiatal hernia repair 2005 ([facility])  Family History: Reviewed history from  07/11/2008 and no changes required. Notable  in that both parents are deceased. Her mother died with heart failure. She had diabetes mellitus and renal abnormalities. Her father died from heart problems. She has three brothers and there were four sisters, one sister is diceased secondary tp breast cancer, and another sister has breast cancer status- post mastectomy. No FH of Colon Cancer:  Social History: Reviewed history from 07/11/2008 and no changes required.  Patient does not smoke or drink.  She lives in [place]  with her husband. Illicit Drug Use - no Daily Caffeine Use  soft drink  Review of Systems       cough, sinus problems x 2-3 weeks, PCP Txed with Amoxiciliin (White)  Vital Signs:  Patient profile:   75 year old female Height:      64 inches Weight:      158 pounds BMI:     27.22 Pulse rate:   68 / minute Pulse rhythm:   regular BP sitting:   100 / 62  (left arm) Cuff size:   regular  Vitals Entered By: June McMurray CMA Duncan Dull) (April 16, 2009 4:21 PM)   Impression & Recommendations:  Problem # 1:  IRON DEFICIENCY ANEMIA SECONDARY TO BLOOD LOSS (ICD-280.0) Assessment Deteriorated she had more bleeding when recently on heparin. This is almost certainly from her GAVE. Plan for upper endoscopy and ablation of her GAVE. Risks, benefits,and indications of endoscopic procedure(s) were reviewed with the patient and all questions answered.  Orders: EGD (EGD)  Problem # 2:  GASTRIC ANTRAL VASCULAR ECTASIA (ICD-537.82) Assessment: Deteriorated She recently bled from this, or least I think. Await EGD. I suspect this is in some way related to her autoimmune disease which has now been diagnosed as Sjogren's.  Problem # 3:  ? of ESOPHAGEAL MOTILITY DISORDER (ICD-530.5) Assessment: Comment Only chest pain could come from this  Patient Instructions: 1)  We will see you at your procedure on 05/01/09. 2)  Copy sent to : Drs. Laurann Montana, Tom Stapleton, [place] 3)  The medication list was reviewed and  reconciled.  All changed / newly prescribed medications were explained.  A complete medication list was provided to the patient / caregiver.

## 2010-04-18 NOTE — Miscellaneous (Signed)
Summary: EGD/ablation [facility]  Clinical Lists Changes  Observations: Added new observation of EGD: GAVE ablated with RFA. otherwise normal  to repeat in 2 mos Dr. Harlen Labs (09/21/2009 20:16)      EGD  Procedure date:  09/21/2009  Findings:      GAVE ablated with RFA. otherwise normal  to repeat in 2 mos Dr. Harlen Labs

## 2010-04-18 NOTE — Procedures (Signed)
Summary: Endoscopy / [facility]  Endoscopy / [facility]   Imported By: Lennie Odor 12/17/2009 09:32:24  _____________________________________________________________________  External Attachment:    Type:   Image     Comment:   External Document

## 2010-04-18 NOTE — Progress Notes (Signed)
Summary: triage  Phone Note Call from Patient Call back at Home Phone [phone] Call back at [phone] (cell)   Caller: Patient Call For: Dr. Leone Payor Reason for Call: Talk to Nurse Summary of Call: pt reports black, tarry stools, chest pain, abd cramping after having an ablasion Initial call taken by: Vallarie Mare,  May 31, 2009 11:29 AM  Follow-up for Phone Call        Patient feeling better today. Patient with dark tarry stool yesterday and chest pains.  She is feeling much better today.  Some minimal stomach cramping, but no further chest pains today .  Doesn't feel she can repeat this in 1 month.   She is able to tolerate a diet.  She age bacon and eggs this am.  Denies fever and vomiting, but c/o nausea.  "I don't feel good".  She is going to run some errands and wants a return call on her cell #.  Dr Leone Payor please advise Follow-up by: Darcey Nora RN, CGRN,  May 31, 2009 11:47 AM  Additional Follow-up for Phone Call Additional follow up Details #1::        I think she should be seen and have  hgb checked today, sounds like she is going to be fine I think this is best and we can then schedule an REV next month before scheduling again. I called her and she agrees so please contact her about getting checked today  Additional Follow-up by: Iva Boop MD, Clementeen Graham,  May 31, 2009 12:01 PM    Additional Follow-up for Phone Call Additional follow up Details #2::    Patient  coming in to see Willette Cluster RNP today at 3:30 Follow-up by: Darcey Nora RN, CGRN,  May 31, 2009 1:19 PM

## 2010-04-18 NOTE — Progress Notes (Signed)
Summary: Recall Office Visit  ---- Converted from flag ---- ---- 03/14/2010 11:51 AM, Harlow Mares CMA (AAMA) wrote: look like this patient is going to [facility] for her GI care we sent her a recall office visit letter, do you want to see her still or should she continue her GI follow up at [facility]? ------------------------------  Phone Note Outgoing Call Call back at [facility] Phone [phone]   Call placed by: Harlow Mares CMA Duncan Dull),  March 14, 2010 2:48 PM Call placed to: Patient Summary of Call: she still needs f/u here for anemia - if she has had CBC done elsewhere ok so ask her who is following up the anemia and if she is having any bleeding she may need an REV with me Initial call taken by: Iva Boop MD, [facility]  March 14, 2010 12:42 PM  Follow-up for Phone Call        patient states that Laurann Montana, MD just did a CBC, I advised pt I would call and get a copy of the most recent labs. She denies any bleeding, or any GI complaints and she said she is doing great and does not feel she needs follow up she is still seeing [facility] when needed. I advised her that I will get the labs for Dr. Teresita Madura review and call her back after he reviews if needed. She verablized understanding.  I called Dr. Foye Spurling office and requested a copy of her labs. Medical records will fax labs today. labs n Dr. Teresita Madura desk for review. Follow-up by: Harlow Mares CMA Duncan Dull),  March 14, 2010 2:53 PM  Additional Follow-up for Phone Call Additional follow up Details #1::        ok Additional Follow-up by: Iva Boop MD, Clementeen Graham,  March 14, 2010 4:02 PM

## 2010-04-18 NOTE — Letter (Signed)
Summary: EGD Instructions  [name]  [address] [place], [place] [address]   Phone: [phone]  Fax: [phone]       [name]    [birth year]    MRN: [id number]       Procedure Day [name]: Jake Shark, 05/01/09     Arrival Time: 8:00 AM     Procedure Time: 9:00 AM     Location of Procedure:                    _X_ [facility] ( Outpatient Registration)    PREPARATION FOR ENDOSCOPY   On TUESDAY, 05/01/09, THE DAY OF THE PROCEDURE:  1.   No solid foods, milk or milk products are allowed after midnight the night before your procedure.  2.   Do not drink anything colored red or purple.  Avoid juices with pulp.  No orange juice.  3.  You may drink clear liquids until 5:00AM, which is 4 hours before your procedure.                                                                                                CLEAR LIQUIDS INCLUDE: Water Jello Ice Popsicles Tea (sugar ok, no milk/cream) Powdered fruit flavored drinks Coffee (sugar ok, no milk/cream) Gatorade Juice: apple, white grape, white cranberry  Lemonade Clear bullion, consomm, broth Carbonated beverages (any kind) Strained chicken noodle soup Hard Candy   MEDICATION INSTRUCTIONS  Unless otherwise instructed, you should take regular prescription medications with a small sip of water as early as possible the morning of your procedure.  Additional medication instructions: NONE             OTHER INSTRUCTIONS  You will need a responsible adult at least 73 years of age to accompany you and drive you home.   This person must remain in the waiting room during your procedure.  Wear loose fitting clothing that is easily removed.  Leave jewelry and other valuables at home.  However, you may wish to bring a book to read or an iPod/MP3 player to listen to music as you wait for your procedure to start.  Remove all body piercing jewelry and leave at home.  Total time from sign-in until  discharge is approximately 2-3 hours.  You should go home directly after your procedure and rest.  You can resume normal activities the day after your procedure.  The day of your procedure you should not:   Drive   Make legal decisions   Operate machinery   Drink alcohol   Return to work  You will receive specific instructions about eating, activities and medications before you leave.    The above instructions have been reviewed and explained to me by   _______________________    I fully understand and can verbalize these instructions _____________________________ Date _________

## 2010-04-18 NOTE — Progress Notes (Signed)
Summary: Nuclear Pre-Procedure  Phone Note Outgoing Call   Call placed by: Milana Na, EMT-P,  March 28, 2009 3:22 PM Summary of Call: Left message with information on Myoview Information Sheet (see scanned document for details).      Nuclear Med Background Indications for Stress Test: Evaluation for Ischemia, Post Hospital  Indications Comments: 03/15/09 to 03/20/09 CP/SOB (-) ischemia mild <troponins  History: Echo, Heart Catheterization, Myocardial Perfusion Study  History Comments: '06 Heart Cath N/O CAD EF65% PDA 70% 09/06 MPS (-) ischemia EF 70% '08 ECHO EF 55-60%  Symptoms: Chest Pain, Dizziness, SOB    Nuclear Pre-Procedure Cardiac Risk Factors: Family History - CAD, Hypertension, Lipids Height (in): 64  Nuclear Med Study Referring MD:  T.Stuckey

## 2010-04-18 NOTE — Assessment & Plan Note (Signed)
Summary: Cardiology Nuclear Study  Nuclear Med Background Indications for Stress Test: Evaluation for Ischemia, Post Hospital  Indications Comments: 03/15/09 to 03/20/09 CP/SOB (-) ischemia mild <troponins  History: Echo, Heart Catheterization, Myocardial Perfusion Study  History Comments: '06 Heart Cath N/O CAD EF65% PDA 70% 09/06 MPS (-) ischemia EF 70% '08 ECHO EF 55-60%  Symptoms: Chest Pain, Dizziness, SOB    Nuclear Pre-Procedure Cardiac Risk Factors: Family History - CAD, Hypertension, Lipids Caffeine/Decaff Intake: None NPO After: 8:00 PM Lungs: clear IV 0.9% NS with Angio Cath: 22g     IV Site: (R) AC IV Started by: Irean Hong RN Chest Size (in) 38     Cup Size C     Height (in): 64 Weight (lb): 157 BMI: 27.05  Nuclear Med Study 1 or 2 day study:  1 day     Stress Test Type:  Eugenie Birks Reading MD:  Marca Ancona, MD     Referring MD:  T.Stuckey Resting Radionuclide:  Technetium 49m Tetrofosmin     Resting Radionuclide Dose:  11.0 mCi  Stress Radionuclide:  Technetium 77m Tetrofosmin     Stress Radionuclide Dose:  33.0 mCi   Stress Protocol   Lexiscan: 0.4 mg   Stress Test Technologist:  Milana Na EMT-P     Nuclear Technologist:  Burna Mortimer Deal RT-N  Rest Procedure  Myocardial perfusion imaging was performed at rest 45 minutes following the intravenous administration of Myoview Technetium 4m Tetrofosmin.  Stress Procedure  The patient received IV Lexiscan 0.4 mg over 15-seconds.  Myoview injected at 30-seconds.  There were no significant changes with infusion.  Quantitative spect images were obtained after a 45 minute delay.  QPS Raw Data Images:  Normal; no motion artifact; normal heart/lung ratio. Stress Images:  Mild mid-anterior perfusion defect.  Rest Images:  Mild mid-anterior perfusion defect.  Subtraction (SDS):  Mild mid-anterior perfusion defect, slightly worse with stress.  Transient Ischemic Dilatation:  .93  (Normal <1.22)  Lung/Heart  Ratio:  .29  (Normal <0.45)  Quantitative Gated Spect Images QGS EDV:  62 ml QGS ESV:  14 ml QGS EF:  78 % QGS cine images:  Normal wall motion.    Overall Impression  Exercise Capacity: Lexiscan study.  BP Response: Normal blood pressure response. Clinical Symptoms: Nausea.  ECG Impression: No significant ST segment change suggestive of ischemia. Overall Impression: Mild mid-anterior defect, slightly worse with stress.  Suspect that this is breast attenuation.  Normal systolic function.  Overall low risk study.   Appended Document: Cardiology Nuclear Study Reviewed with patient in detail in the office.  Continue conservative management.  See OV note.

## 2010-04-18 NOTE — Procedures (Signed)
Summary: Endo Report / [facility]  Endo Report / [facility]   Imported By: Lennie Odor 10/01/2009 14:11:42  _____________________________________________________________________  External Attachment:    Type:   Image     Comment:   External Document

## 2010-04-18 NOTE — Assessment & Plan Note (Signed)
Summary: PER CHECK OUT/SAF   Visit Type:  Follow-up Referring Provider:  n/a Primary Provider:  Laurann Montana, MD   CC:  no complaints.  History of Present Illness: Patient went to [facility] and saw Dr. Anna Genre.  She went there for an ablation, and she had a problem at [facility] had an ablation with 14 different ablations.  Was seen by Dr. Lendon Colonel.  Now has a diagnosis of Sjogrens.  No cardiac symptoms.   Current Medications (verified): 1)  Norvasc 5 Mg Tabs (Amlodipine Besylate) .... Take 1 Tablet By Mouth Two Times A Day 2)  Omeprazole 20 Mg Cpdr (Omeprazole) .... Take One Tablet By Mouth Once Daily. 3)  Glucosamine Chondr 1500 Complx  Caps (Glucosamine-Chondroit-Vit C-Mn) .... Take 1/2 Cap Daily 4)  Mone Vie Juice .[name].. 2 Oz. Daily 5)  Tylenol Extra Strength 500 Mg Tabs (Acetaminophen) .... As Needed 6)  Temazepam 15 Mg Caps (Temazepam) .... As Needed 7)  Claritin 10 Mg Tabs (Loratadine) .... As Needed 8)  Cvs Iron 325 (65 Fe) Mg Tabs (Ferrous Sulfate) .... Take 1 Tablet By Mouth Once A Day 9)  Grape Seed Extract 100 Mg Caps (Grape Seed) .... Take One Tablet By Mouth Once Daily. 10)  Nitrostat 0.4 Mg Subl (Nitroglycerin) .[name].. 1 Tablet Under Tongue At Onset of Chest Pain; You May Repeat Every 5 Minutes For Up To 3 Doses. 11)  Metoprolol Tartrate 25 Mg Tabs (Metoprolol Tartrate) .... Take One Tablet By Mouth Twice A Day 12)  Nystatin 100000 Unit/ml Susp (Nystatin) .... As Needed 13)  Zinc 50 Mg Tabs (Zinc) .... Once Daily  Allergies (verified): 1)  ! Crestor (Rosuvastatin Calcium) 2)  ! Vytorin (Ezetimibe-Simvastatin) 3)  ! Zetia (Ezetimibe) 4)  ! Morphine  Vital Signs:  Patient profile:   75 year old female Height:      64 inches Weight:      155 pounds BMI:     26.70 Pulse rate:   57 / minute BP sitting:   126 / 74  (left arm) Cuff size:   regular  Vitals Entered By: Hardin Negus, RMA (November 14, 2009 4:58 PM)  Physical Exam  General:  Well developed, well  nourished, in no acute distress. Head:  normocephalic and atraumatic Eyes:  PERRLA/EOM intact; conjunctiva and lids normal. Lungs:  Clear bilaterally to auscultation and percussion. Heart:  Non-displaced PMI, chest non-tender; regular rate and rhythm, S1, S2 without murmurs, rubs or gallops. Msk:  Back normal, normal gait. Muscle strength and tone normal. Pulses:  pulses normal in all 4 extremities Extremities:  No clubbing or cyanosis. Neurologic:  Alert and oriented x 3.   EKG  Procedure date:  11/14/2009  Findings:      SB.  Otherwise, normal ECG.   Impression & Recommendations:  Problem # 1:  CORONARY ARTERY DISEASE (ICD-414.00) Currently stable.   Her updated medication list for this problem includes:    Norvasc 5 Mg Tabs (Amlodipine besylate) .[name]... Take 1 tablet by mouth two times a day    Nitrostat 0.4 Mg Subl (Nitroglycerin) .[name]... 1 tablet under tongue at onset of chest pain; you may repeat every 5 minutes for up to 3 doses.    Metoprolol Tartrate 25 Mg Tabs (Metoprolol tartrate) .[name]... Take one tablet by mouth twice a day  Problem # 2:  DYSLIPIDEMIA (ICD-272.4) statin intolerant.   Problem # 3:  SJOGREN'S SYNDROME (ICD-710.2) new diagnosis.  They have talked about Plaquenil.    Other Orders: EKG w/ Interpretation (  93000)  Patient Instructions: 1)  Your physician recommends that you schedule a follow-up appointment in: 4 months 2)  Your physician recommends that you continue on your current medications as directed. Please refer to the Current Medication list given to you today.

## 2010-04-18 NOTE — Progress Notes (Signed)
Summary: Set up labs/ nuclear test   Phone Note Call from Patient Call back at [phone]   Caller: Patient Summary of Call: The pt called in on my direct line- per the pt, she states Dr. Riley Kill called her on thursday last week about 9pm and stated she needed to come in tues 1/11 for labs and for a nuclear stress test on thursday 1/13. I explained to the pt that I see no documentation of that in her EMR, but I will try to find out what is going on and call her back. She may come today for labs due to the potential for bad weather later today and tomorrow. The pt states she has had some internal bleeding. I will try to find out what is going on and call her back. Sherri Rad, RN, BSN  March 26, 2009 9:59 AM  I have reviewed Lauren's paperwork and flags and see no documentation on this pt from Dr. Riley Kill. I reviewed the pt's labs from the hospital and she did have an Hgb around 9.6 with heme + stools. She had a llttle bump in her creatinine. We will have the pt come today for a CBC and BMET. I will forward this message to Maryann Alar for Dr. Riley Kill to see if she knows exactlly what Dr. Riley Kill wants for the pt. I will have Lauren call the pt back. The pt is agreeable with this plan. Sherri Rad, RN, BSN  March 26, 2009 10:15 AM   Follow-up for Phone Call        I called the patient and wanted to repeat her CBC, and get a BMET. Also wanted to schedule for a adenosine myoview for Thursday. I will be in town that day. Follow-up by: Ronaldo Miyamoto, MD, [facility],  March 26, 2009 10:52 AM

## 2010-04-18 NOTE — Assessment & Plan Note (Signed)
Summary: DISCUSS EGD/APC/GAVE/SP   History of Present Illness Visit Type: Follow-up Visit Primary GI MD: Stan Head MD [facility] Primary Provider: Laurann [place], MD  Requesting Provider: n/a Chief Complaint: Discuss ablation  History of Present Illness:   ?'s today 1) Is there any pain medication she can take for Sjogren's and osteoarthrits. "I just hurt all the time" - winter has been rough. Mobic was great for that.  Has tried a topical agent for thumbs (Voltaren gel) did not help and NTG ointment was for Raynaud's 2) How many times will I have to do this (EGD)?    GI Review of Systems      Denies abdominal pain, acid reflux, belching, bloating, chest pain, dysphagia with liquids, dysphagia with solids, heartburn, loss of appetite, nausea, vomiting, vomiting blood, weight loss, and  weight gain.        Denies anal fissure, black tarry stools, change in bowel habit, constipation, diarrhea, diverticulosis, fecal incontinence, heme positive stool, hemorrhoids, irritable bowel syndrome, jaundice, light color stool, liver problems, rectal bleeding, and  rectal pain.    Current Medications (verified): 1)  Norvasc 5 Mg Tabs (Amlodipine Besylate) .... Take 1 Tablet By Mouth Two Times A Day 2)  Omeprazole 20 Mg Cpdr (Omeprazole) .... Take One Tablet By Mouth Once Daily. 3)  Glucosamine Chondr 1500 Complx  Caps (Glucosamine-Chondroit-Vit C-Mn) .... Take 1/2 Cap Daily 4)  Mone Vie Juice .[name].. 2 Oz. Daily 5)  Tylenol Extra Strength 500 Mg Tabs (Acetaminophen) .... As Needed 6)  Temazepam 15 Mg Caps (Temazepam) .... As Needed 7)  Claritin 10 Mg Tabs (Loratadine) .... As Needed 8)  Cvs Iron 325 (65 Fe) Mg Tabs (Ferrous Sulfate) .... Take 1 Tablet By Mouth Once A Day 9)  Grape Seed Extract 100 Mg Caps (Grape Seed) .... Take One Tablet By Mouth Once Daily. 10)  Nitrostat 0.4 Mg Subl (Nitroglycerin) .[name].. 1 Tablet Under Tongue At Onset of Chest Pain; You May Repeat Every 5 Minutes For Up To 3  Doses. 11)  Metoprolol Tartrate 25 Mg Tabs (Metoprolol Tartrate) .... Take One Tablet By Mouth Twice A Day 12)  Nystatin 100000 Unit/ml Susp (Nystatin) .... As Needed 13)  Hydrocodone-Homatropine 5-1.5 Mg/31ml Syrp (Hydrocodone-Homatropine) .... As Needed  Allergies (verified): 1)  ! Crestor (Rosuvastatin Calcium) 2)  ! Vytorin (Ezetimibe-Simvastatin) 3)  ! Zetia (Ezetimibe) 4)  ! Morphine  Past History:  Past Medical History: Reviewed history from 04/16/2009 and no changes required. 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 Cholecystectomy for gallbladder dyskinesia, 2007([facility]) Bilateral oopherectomy Left breast lumpectomy Tear duct surgery Total Abdominal Hysterectomy Paraesophageal and hiatal hernia repair 2005 ([facility])  Family History: Reviewed history from 07/11/2008 and no changes required. Notable in that both parents are deceased. Her mother died with heart failure. She had diabetes mellitus and renal abnormalities. Her father died from heart problems. She has three brothers and there were four sisters, one sister is diceased secondary tp breast cancer, and another sister has breast cancer status- post mastectomy. No FH of Colon Cancer:  Social History: Reviewed history from 07/11/2008 and no changes required.  Patient does not smoke  or drink.  She lives in [place]  with her husband. Illicit Drug  Use - no Daily Caffeine Use  soft drink  Vital Signs:  Patient profile:   75 year old female Height:      64 inches Weight:      161 pounds BMI:     27.74 BSA:     1.79 Pulse rate:   60 / minute Pulse rhythm:   regular BP sitting:   120 / 68  (left arm) Cuff size:   large  Vitals Entered By: Ok Anis CMA (May 24, 2009 2:02 PM)  Physical Exam  General:  Well developed, well nourished, in no acute distress.   Impression & Recommendations:  Problem # 1:  GASTRIC ANTRAL VASCULAR ECTASIA (ICD-537.82) Assessment Unchanged for EGD and repeat antral APC ablation March 15. ?'s answered today. She will likley need at least 3 EGD's  Problem # 2:  SJOGREN'S SYNDROME (ICD-710.2) Assessment: Unchanged ? if pain due to this I encouraged her to discuss other analgesia options with Dr. Nickola Major - e.g. ? TCA, ? Ultram She will not be a Celebrex candidate due to CAD issues but may eventually be able to [place] NSAID and PPI once GAVE ablated  20 minutes tie spent with patient and husband discussing her problems.  Patient Instructions: 1)  Will see you at your endoscopy March 15. 2)  Please continue current medications.  3)  The medication list was reviewed and reconciled.  All changed / newly prescribed medications were explained.  A complete medication list was provided to the patient / caregiver. cc: Drs. Zenovia Jordan, Warren Danes [place]

## 2010-04-18 NOTE — Progress Notes (Signed)
Summary: Questions about procedure  Phone Note Call from Patient Call back at Home Phone [phone]   Caller: Patient Call For: Dr. Leone Payor Reason for Call: Talk to Nurse Summary of Call: Pt has some questions about her EGD that is scheduled next Tues. Initial call taken by: Karna Christmas,  April 27, 2009 11:39 AM  Follow-up for Phone Call        all questions answered  Follow-up by: Darcey Nora RN, CGRN,  April 27, 2009 1:07 PM

## 2010-04-18 NOTE — Miscellaneous (Signed)
Summary: Orders Update  Clinical Lists Changes  Problems: Added new problem of CHEST PAIN UNSPECIFIED (ICD-786.50) Orders: Added new Referral order of Nuclear Stress Test (Nuc Stress Test) - Signed

## 2010-04-18 NOTE — Progress Notes (Signed)
Summary: Adenosine myoview  Phone Note Outgoing Call   Call placed by: Sherri Rad, RN, BSN,  March 26, 2009 11:04 AM Summary of Call: I recieved a call from Dr. Riley Kill. He states the pt does need a CBC and BMET and then an adenosine myoview on thursday. I have left the pt a message on the pt's home and cell #'s that we will be contacting her to schedule this for her. I will forward to the PCC's.  Initial call taken by: Sherri Rad, RN, BSN,  March 26, 2009 11:06 AM

## 2010-04-18 NOTE — Assessment & Plan Note (Signed)
Summary: post EGD/ablation for GAVE/abdominal pain   History of Present Illness Visit Type: Follow-up Visit Primary GI MD: Stan Head MD [facility] Primary Provider: Laurann [place], MD  Requesting Provider: n/a Chief Complaint: Pt had EGD with ablation. Pt states that she called c/o black tarry stools and abd cramping but she feels much better and denies any GI complaints  History of Present Illness:   Patient had EGD with ablation yesterday (GAVE). Post-procedure had several hours of intermittent upper abdomen pain radiating into chest. Stools dark yesterday, normal color today. No SOB. We asked her to come in for recheck.   GI Review of Systems    Reports abdominal pain.     Location of  Abdominal pain: upper abdomen.    Denies acid reflux, belching, bloating, chest pain, dysphagia with liquids, dysphagia with solids, heartburn, loss of appetite, nausea, vomiting, vomiting blood, weight loss, and  weight gain.        Denies anal fissure, black tarry stools, change in bowel habit, constipation, diarrhea, diverticulosis, fecal incontinence, heme positive stool, hemorrhoids, irritable bowel syndrome, jaundice, light color stool, liver problems, rectal bleeding, and  rectal pain.    Current Medications (verified): 1)  Norvasc 5 Mg Tabs (Amlodipine Besylate) .... Take 1 Tablet By Mouth Two Times A Day 2)  Omeprazole 20 Mg Cpdr (Omeprazole) .... Take One Tablet By Mouth Once Daily. 3)  Glucosamine Chondr 1500 Complx  Caps (Glucosamine-Chondroit-Vit C-Mn) .... Take 1/2 Cap Daily 4)  Mone Vie Juice .[name].. 2 Oz. Daily 5)  Tylenol Extra Strength 500 Mg Tabs (Acetaminophen) .... As Needed 6)  Temazepam 15 Mg Caps (Temazepam) .... As Needed 7)  Claritin 10 Mg Tabs (Loratadine) .... As Needed 8)  Cvs Iron 325 (65 Fe) Mg Tabs (Ferrous Sulfate) .... Take 1 Tablet By Mouth Once A Day 9)  Grape Seed Extract 100 Mg Caps (Grape Seed) .... Take One Tablet By Mouth Once Daily. 10)  Nitrostat 0.4 Mg Subl  (Nitroglycerin) .[name].. 1 Tablet Under Tongue At Onset of Chest Pain; You May Repeat Every 5 Minutes For Up To 3 Doses. 11)  Metoprolol Tartrate 25 Mg Tabs (Metoprolol Tartrate) .... Take One Tablet By Mouth Twice A Day 12)  Nystatin 100000 Unit/ml Susp (Nystatin) .... As Needed 13)  Hydrocodone-Homatropine 5-1.5 Mg/20ml Syrp (Hydrocodone-Homatropine) .... As Needed  Allergies (verified): 1)  ! Crestor (Rosuvastatin Calcium) 2)  ! Vytorin (Ezetimibe-Simvastatin) 3)  ! Zetia (Ezetimibe) 4)  ! Morphine  Past History:  Past Medical History: Reviewed history from 04/16/2009 and no changes required. 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 Cholecystectomy for gallbladder dyskinesia, 2007([facility]) Bilateral oopherectomy Left breast lumpectomy Tear duct surgery Total Abdominal Hysterectomy Paraesophageal and hiatal hernia repair 2005 ([facility])  Family History: Reviewed history from 07/11/2008 and no changes required. Notable in that both parents are deceased. Her mother died with heart failure. She had diabetes mellitus and renal abnormalities. Her father died from heart problems. She has three brothers and there were four sisters, one sister is diceased secondary tp breast cancer, and another sister has breast cancer status- post mastectomy. No FH of Colon Cancer:  Social  History: Reviewed history from 07/11/2008 and no  changes required.  Patient does not smoke or drink.  She lives in [place]  with her husband. Illicit Drug Use - no Daily Caffeine Use  soft drink  Review of Systems  The patient denies allergy/sinus, anemia, anxiety-new, arthritis/joint pain, back pain, blood in urine, breast changes/lumps, change in vision, confusion, cough, coughing up blood, depression-new, fainting, fatigue, fever, headaches-new, hearing problems, heart murmur, heart rhythm changes, itching, menstrual pain, muscle pains/cramps, night sweats, nosebleeds, pregnancy symptoms, shortness of breath, skin rash, sleeping problems, sore throat, swelling of feet/legs, swollen lymph glands, thirst - excessive , urination - excessive , urination changes/pain, urine leakage, vision changes, and voice change.    Vital Signs:  Patient profile:   75 year old female Height:      64 inches Weight:      162 pounds BMI:     27.91 BSA:     1.79 Pulse rate:   60 / minute Pulse rhythm:   regular BP sitting:   122 / 64  (left arm) Cuff size:   regular  Vitals Entered By: Ok Anis CMA (May 31, 2009 3:21 PM)  Physical Exam  General:  Well developed, well nourished, no acute distress. Eyes:  Conjunctiva pink, no icterus.  Mouth:  No oral lesions. Tongue moist.  Lungs:  Clear throughout to auscultation. Heart:  Regular rate and rhythm Abdomen:  Abdomen soft, nontender, nondistended. No obvious masses or hepatomegaly.Normal bowel sounds.  Neurologic:  Alert and  oriented x4;  grossly normal neurologically. Psych:  Alert and cooperative. Normal mood and affect.   Impression & Recommendations:  Problem # 1:  GASTRIC ANTRAL VASCULAR ECTASIA (ICD-537.82) Assessment Unchanged S/P EGD with ablation yesterday. Post-procedure she had black stools and several hours of upper abdominal pain. No abdominal pain today, stools now normal in color. No dizziness or SOB. No pallor. She looks and feels okay. Will check H&H. She will need  repeat EGD for GAVE, she doesn't want to schedule that yet.  Other Orders: TLB-CBC Platelet - w/Differential ([id number])  Patient Instructions: 1)  Your physician has requested that you have the following labwork done today: 2)  Please go to our basement level.  3)  The medication list was reviewed and reconciled.  All changed / newly prescribed medications were explained.  A complete medication list was provided to the patient / caregiver.

## 2010-04-18 NOTE — Progress Notes (Signed)
Summary: Sooner appt.  Phone Note Call from Patient Call back at Home Phone [phone]   Caller: Patient Call For: Dr. Leone Payor Reason for Call: Talk to Nurse Summary of Call: Pt. was bumped from her 05-22-09 appt. until 06-13-09. She has her ablation on 05-29-09 at [facility]. and would like to be seen before her 3-15 appt. If not then she wants to r/s her appt. for the procedure Initial call taken by: Karna Christmas,  May 11, 2009 3:57 PM  Follow-up for Phone Call        Patient has multiple questions about GAVE and the frequency of repeat EGD with APC.  I had her scheduled for 05/22/09 to discuss prior to her appointment for EGD on 05/29/09, they have bumped your schedule for and EPIC meeting.  Do you want to call her and discuss on the phone or over book your schedule on another day prior to the appointment.  Please advise.  She is aware you are out of the office until next week. Follow-up by: Darcey Nora RN, CGRN,  May 11, 2009 4:03 PM  Additional Follow-up for Phone Call Additional follow up Details #1::        Please accomodate her or I could call her if desired. I do not recall recommending an REV before and unless there are problems should not be absolutely necessary. we can accomodate her wishes, though. Additional Follow-up by: Iva Boop MD, Clementeen Graham,  May 13, 2009 10:00 AM    Additional Follow-up for Phone Call Additional follow up Details #2::    Patient  wants to discuss all the above with Dr Leone Payor prior to proceeding on the 15th.  REV rescheduled to 05-17-09 2:15 Follow-up by: Darcey Nora RN, CGRN,  May 14, 2009 10:01 AM

## 2010-04-18 NOTE — Progress Notes (Signed)
Summary: follow-up call to pt  Phone Note Outgoing Call   Call placed by: Iva Boop MD, Clementeen Graham,  Jul 23, 2009 9:37 AM Summary of Call: left message that I was trying to reach her  Follow-up for Phone Call        I have recommended a referral to [facility] re: ablation of GAVE she also needs a CBC and could come today or next week please order I need to ask Dr. Margaretha Glassing before we set up though Iva Boop MD, [facility]  Jul 27, 2009 11:07 AM   Additional Follow-up for Phone Call Additional follow up Details #1::        labs entered for today, patient will try to come today or Monday. Darcey Nora RN, CGRN  Jul 27, 2009 11:22 AM

## 2010-04-18 NOTE — Assessment & Plan Note (Signed)
Summary: discuss GAVE/ablation therapy scheduled for 05/29/09/sheri   History of Present Illness Visit Type: Follow-up Visit Primary GI MD: Stan Head MD [facility] Primary Provider: Laurann [place], MD  Requesting Provider: n/a Chief Complaint: Discuss GAVE disease  History of Present Illness:   26 o white woman followed for GAVE (gastric antral vascular ectasia). She recently underwent second EGD and antral ablation. She had abdominal pain and some transient melena after that, Hgb was actually better (11.9). "My stomach is better but not feeling great." Not sleeping well. Family stress but does not elaborate. No abdominla pain with eating. She is having great difficult with lack of taste due to Sjogren's.     GI Review of Systems      Denies abdominal pain, acid reflux, belching, bloating, chest pain, dysphagia with liquids, dysphagia with solids, heartburn, loss of appetite, nausea, vomiting, vomiting blood, weight loss, and  weight gain.        Denies anal fissure, black tarry stools, change in bowel habit, constipation, diarrhea, diverticulosis, fecal incontinence, heme positive stool, hemorrhoids, irritable bowel syndrome, jaundice, light color stool, liver problems, rectal bleeding, and  rectal pain. EGD  Procedure date:  05/29/2009  Findings:      1) GAVE (gastric antral vascular ec in the antrum) - ablated     with setting of 60 today 2) Otherwise normal examination     Current Medications (verified): 1)  Norvasc 5 Mg Tabs (Amlodipine Besylate) .... Take 1 Tablet By Mouth Two Times A Day 2)  Omeprazole 20 Mg Cpdr (Omeprazole) .... Take One Tablet By Mouth Once Daily. 3)  Glucosamine Chondr 1500 Complx  Caps (Glucosamine-Chondroit-Vit C-Mn) .... Take 1/2 Cap Daily 4)  Mone Vie Juice .[name].. 2 Oz. Daily 5)  Tylenol Extra Strength 500 Mg Tabs (Acetaminophen) .... As Needed 6)  Temazepam 15 Mg Caps (Temazepam) .... As Needed 7)  Claritin 10 Mg Tabs (Loratadine) .... As  Needed 8)  Cvs Iron 325 (65 Fe) Mg Tabs (Ferrous Sulfate) .... Take 1 Tablet By Mouth Once A Day 9)  Grape Seed Extract 100 Mg Caps (Grape Seed) .... Take One Tablet By Mouth Once Daily. 10)  Nitrostat 0.4 Mg Subl (Nitroglycerin) .[name].. 1 Tablet Under Tongue At Onset of Chest Pain; You May Repeat Every 5 Minutes For Up To 3 Doses. 11)  Metoprolol Tartrate 25 Mg Tabs (Metoprolol Tartrate) .... Take One Tablet By Mouth Twice A Day 12)  Nystatin 100000 Unit/ml Susp (Nystatin) .... As Needed 13)  Hydrocodone-Homatropine 5-1.5 Mg/2ml Syrp (Hydrocodone-Homatropine) .... As Needed  Allergies (verified): 1)  ! Crestor (Rosuvastatin Calcium) 2)  ! Vytorin (Ezetimibe-Simvastatin) 3)  ! Zetia (Ezetimibe) 4)  ! Morphine  Past History:  Past Medical History: SUPRAVENTRICULAR TACHYCARDIA (ICD-427.89) CORONARY ARTERY DISEASE (ICD-414.00) DYSLIPIDEMIA (ICD-272.4)) Hx of ENDOMETRIOSIS (ICD-617.9) GERD (ICD-530.81) COLONIC POLYPS, ADENOMATOUS, HX OF (ICD-V12.72) RIGHT BUNDLE BRANCH BLOCK (ICD-426.4) IRON DEFICIENCY ANEMIA SECONDARY TO BLOOD LOSS (ICD-280.0) OSTEOARTHRITIS (ICD-715.90) RAYNAUD'S SYNDROME (ICD-443.0) GASTRIC ANTRAL VASCULAR ECTASIA  ? 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 Cholecystectomy for gallbladder dyskinesia, 2007([facility]) Bilateral oopherectomy Left breast lumpectomy Tear duct surgery Total Abdominal Hysterectomy Paraesophageal and hiatal hernia repair 2005 ([facility])  Family History: Reviewed history from 07/11/2008 and no changes required. Notable in that both parents are deceased. Her mother died with heart failure. She had diabetes mellitus and renal abnormalities. Her father died from heart problems. She has three brothers  and there were four sisters, one sister is diceased secondary tp breast cancer,  and another sister has breast cancer status- post mastectomy. No FH of Colon Cancer:  Social History: Reviewed history from 07/11/2008 and no changes required.  Patient does not smoke or drink.  She lives in [place]  with her husband. Illicit Drug Use - no Daily Caffeine Use  soft drink  Review of Systems       insomnia, especially last few nights stressed about family  Vital Signs:  Patient profile:   75 year old female Height:      64 inches Weight:      158 pounds BMI:     27.22 BSA:     1.77 Pulse rate:   60 / minute Pulse rhythm:   regular BP sitting:   122 / 68  (left arm) Cuff size:   regular  Vitals Entered By: Ok Anis CMA (June 13, 2009 11:22 AM)  Physical Exam  General:  Well developed, well nourished, no acute distress. Eyes:   no icterus.  Abdomen:  soft and nontender   Impression & Recommendations:  Problem # 1:  IRON DEFICIENCY ANEMIA SECONDARY TO BLOOD LOSS (ICD-280.0) Assessment Improved Continue iron  Problem # 2:  GASTRIC ANTRAL VASCULAR ECTASIA (ICD-537.82) Assessment: Unchanged She had some abdominal pain and transient melena after 3/15 ablation. I had increased the APC setting from 40 to 60 and that may have been the cause. Hopefully that will have acheived a good effect. She will need a reassessment at least but she is a bit gunshy at this time. Plan to at least reassess with EGD in May - will call her about this. Have discussed possible Barryx treatment but would nee to go elsewhere for that.  Patient Instructions: 1)  Dr. Leone Payor (or staff) will call you in about 3 weeks to discuss arranging an upper endoscopy again for May. 2)  Please schedule a follow-up appointment as needed in the inerim. 3)    4)  Please continue current medications.  5)  Copy sent to Drs. Bonnee Quin, Zenovia Jordan and [place] 6)  The medication list was reviewed and reconciled.  All changed / newly prescribed medications were explained.  A complete  medication list was provided to the patient / caregiver.

## 2010-04-18 NOTE — Assessment & Plan Note (Signed)
Summary: 1 MO F/U   Visit Type:  Follow-up Referring Provider:  n/a Primary Provider:  Ollen Gross  CC:  No cardiac complains.  History of Present Illness: Has had head congestion and has one round of antibiotics.  Arthritis is bothering her.  Has had no further chest pain.  Does not feel all that well.  Is about to have repeat endoscopy.    Current Medications (verified): 1)  Norvasc 5 Mg Tabs (Amlodipine Besylate) .... Take 1 Tablet By Mouth Once A Day 2)  Omeprazole 20 Mg Cpdr (Omeprazole) .... Take One Tablet By Mouth Once Daily. 3)  Glucosamine Chondr 1500 Complx  Caps (Glucosamine-Chondroit-Vit C-Mn) .... Take 1/2 Cap Daily 4)  Mone Vie Juice .[name].. 2 Oz. Daily 5)  Tylenol Extra Strength 500 Mg Tabs (Acetaminophen) .... As Needed 6)  Temazepam 15 Mg Caps (Temazepam) .... As Needed 7)  Claritin 10 Mg Tabs (Loratadine) .... As Needed 8)  Cvs Iron 325 (65 Fe) Mg Tabs (Ferrous Sulfate) .... Take 1 Tablet By Mouth Once A Day 9)  Grape Seed Extract 100 Mg Caps (Grape Seed) .... Take One Tablet By Mouth Once Daily. 10)  Nitrostat 0.4 Mg Subl (Nitroglycerin) .[name].. 1 Tablet Under Tongue At Onset of Chest Pain; You May Repeat Every 5 Minutes For Up To 3 Doses. 11)  Metoprolol Tartrate 25 Mg Tabs (Metoprolol Tartrate) .... Take One Tablet By Mouth Twice A Day 12)  Nystatin 100000 Unit/ml Susp (Nystatin) .... As Needed 13)  Hydrocodone-Homatropine 5-1.5 Mg/43ml Syrp (Hydrocodone-Homatropine) .... Take 1 Teaspoon At Bedtime  Allergies: 1)  ! Crestor (Rosuvastatin Calcium) 2)  ! Vytorin (Ezetimibe-Simvastatin) 3)  ! Zetia (Ezetimibe) 4)  ! Morphine  Vital Signs:  Patient profile:   75 year old female Height:      64 inches Weight:      161 pounds BMI:     27.74 Pulse rate:   56 / minute Pulse rhythm:   regular Resp:     18 per minute BP sitting:   104 / 70  (left arm) Cuff size:   large  Vitals Entered By: Vikki Ports (April 24, 2009 9:47 AM)  Physical Exam  General:   Well developed, well nourished, in no acute distress. Head:  normocephalic and atraumatic Eyes:  PERRLA/EOM intact; conjunctiva and lids normal. Ears:  Mild erythem R drum. Nose:  no deformity, discharge, inflammation, or lesions Mouth:  Teeth, gums and palate normal. Oral mucosa normal. Lungs:  Clear bilaterally to auscultation and percussion. Heart:  Non-displaced PMI, chest non-tender; regular rate and rhythm, S1, S2 without murmurs, rubs or gallops. Carotid upstroke normal, no bruit. Normal abdominal aortic size, no bruits. Femorals normal pulses, no bruits. Pedals normal pulses. No edema, no varicosities.   Impression & Recommendations:  Problem # 1:  CORONARY ARTERY DISEASE (ICD-414.00) She remains stable and without chest pain.  Myoview is not high risk.  Currently not an intervetional candidate because of GAVE, with chronic GI bleeding.  She is scheduled for repeat endo with Dr. Leone Payor to address.  Difficult issue at present.   The following medications were removed from the medication list:    Lopressor 50 Mg Tabs (Metoprolol tartrate) .[name]..[name] Two times a day Her updated medication list for this problem includes:    Norvasc 5 Mg Tabs (Amlodipine besylate) .[name]... Take 1 tablet by mouth once a day    Nitrostat 0.4 Mg Subl (Nitroglycerin) .[name]... 1 tablet under tongue at onset of chest pain; you may repeat every  5 minutes for up to 3 doses.    Metoprolol Tartrate 25 Mg Tabs (Metoprolol tartrate) .[name]... Take one tablet by mouth twice a day  Problem # 2:  OSTEOARTHRITIS (ICD-715.90) Very smptomatic.  Wants to consider a NSAID, but not a good candidate for this.  Celebrex would be best option, but she understands risks.  Problem # 3:  IRON DEFICIENCY ANEMIA SECONDARY TO BLOOD LOSS (ICD-280.0)  Has GAVE and endo planned.  Check CBC.  Orders: TLB-CBC Platelet - w/Differential ([id number])  Patient Instructions: 1)  Your physician recommends that you schedule a follow-up appointment in: 2  months with Dr. Riley Kill 2)  Your physician recommends that you getr lab work today. 3)  Your physician recommends that you continue on your current medications as directed. Please refer to the Current Medication list given to you today.

## 2010-04-18 NOTE — Progress Notes (Signed)
Summary: questions re cardiac history  Phone Note Call from Patient   Caller: Patient Reason for Call: Talk to Nurse Summary of Call: pt going to [facility] tomorrow-will ask questions re her cardiac history and she would like to talk to a nurse about what to tell them-pls call [phone] Initial call taken by: Glynda Jaeger,  September 20, 2009 11:55 AM  Follow-up for Phone Call        Left message to call back. Julieta Gutting, RN, BSN  September 20, 2009 3:30 PM  I spoke with the pt and told her she has HTN, non-obstructive CAD and a normal EF.  Follow-up by: Julieta Gutting, RN, BSN,  September 20, 2009 3:54 PM

## 2010-04-18 NOTE — Letter (Signed)
Summary: [facility]  [facility]   Imported By: Marylou Mccoy 03/14/2010 16:58:27  _____________________________________________________________________  External Attachment:    Type:   Image     Comment:   External Document

## 2010-04-18 NOTE — Progress Notes (Signed)
Summary: ablation questions  Phone Note Call from Patient Call back at Home Phone [phone] Call back at [phone] (cell)   Caller: Patient Call For: Dr. Leone Payor Reason for Call: Talk to Nurse Summary of Call: would like to speak with Dr. Leone Payor before hospital ablation 05/29/2009... doesnt feel like she "knows what's going on" Initial call taken by: Vallarie Mare,  May 07, 2009 10:04 AM  Follow-up for Phone Call        Patient  wants to discuss GAVE syndrome and future care prior to her procedure on 05/29/09.  Patient is scheduled for rev with Dr Leone Payor for 05/22/09 8:45 Follow-up by: Darcey Nora RN, CGRN,  May 07, 2009 11:19 AM

## 2010-04-18 NOTE — Letter (Signed)
Summary: [facility]  [facility]   Imported By: Marylou Mccoy 03/13/2010 14:54:08  _____________________________________________________________________  External Attachment:    Type:   Image     Comment:   External Document

## 2010-04-18 NOTE — Procedures (Signed)
Summary: Instructions for procedure/[facility] (out pt)  Instructions for procedure/[facility] (out pt)   Imported By: Sherian Rein 04/19/2009 07:07:34  _____________________________________________________________________  External Attachment:    Type:   Image     Comment:   External Document

## 2010-04-18 NOTE — Assessment & Plan Note (Signed)
Summary: EPH   Visit Type:  Follow-up Referring Provider:  n/a Primary Provider:  Ollen Gross   History of Present Illness: No recurrent  chest pain.  No further symptoms.  Nuclear study reviewed.  Hospitalization reviewed in detail.  Her left eye gets unusual.  She had pain over the left eye.  BP was low.  Left eye becomes very blurred.  She cannot stand up when this happens.  Then it goes away.  She had mild back pain.  The eye lasted less than two minutes.  Currently feels fine and has no further symptoms.    Current Medications (verified): 1)  Norvasc 5 Mg Tabs (Amlodipine Besylate) .... Take 1 Tablet By Mouth Once A Day 2)  Omeprazole 20 Mg Cpdr (Omeprazole) .... Take One Tablet By Mouth Once Daily. 3)  Glucosamine Chondr 1500 Complx  Caps (Glucosamine-Chondroit-Vit C-Mn) .... Take 1/2 Cap Daily 4)  Mone Vie Juice .[name].. 2 Oz. Daily 5)  Tylenol Extra Strength 500 Mg Tabs (Acetaminophen) .... As Needed 6)  Temazepam 15 Mg Caps (Temazepam) .... As Needed 7)  Claritin 10 Mg Tabs (Loratadine) .... As Needed 8)  Cvs Iron 325 (65 Fe) Mg Tabs (Ferrous Sulfate) .[name].. 1-2 By Mouth Once Daily 9)  Aspirin 81 Mg Tbec (Aspirin) .... Take One Tablet By Mouth Daily 10)  Grape Seed Extract 100 Mg Caps (Grape Seed) .... Take One Tablet By Mouth Once Daily. 11)  Nitrostat 0.4 Mg Subl (Nitroglycerin) .[name].. 1 Tablet Under Tongue At Onset of Chest Pain; You May Repeat Every 5 Minutes For Up To 3 Doses. 12)  Metoprolol Tartrate 25 Mg Tabs (Metoprolol Tartrate) .... Take One Tablet By Mouth Twice A Day  Allergies (verified): 1)  ! Crestor (Rosuvastatin Calcium) 2)  ! Vytorin (Ezetimibe-Simvastatin) 3)  ! Zetia (Ezetimibe) 4)  ! Morphine  Past History:  Past Medical History: Last updated: 07/11/2008 SUPRAVENTRICULAR TACHYCARDIA (ICD-427.89) CORONARY ARTERY DISEASE (ICD-414.00) DYSLIPIDEMIA (ICD-272.4)) Hx of ENDOMETRIOSIS (ICD-617.9) GERD (ICD-530.81) COLONIC POLYPS, ADENOMATOUS, HX OF  (ICD-V12.72) RIGHT BUNDLE BRANCH BLOCK (ICD-426.4) IRON DEFICIENCY ANEMIA SECONDARY TO BLOOD LOSS (ICD-280.0) OSTEOARTHRITIS (ICD-715.90) RAYNAUD'S SYNDROME (ICD-443.0) GASTRIC ANTRAL VASCULAR ECTASIA (SUSPECTED) ? 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 Head:  normocephalic and atraumatic Lungs:  Clear bilaterally to auscultation and percussion. Heart:  Non-displaced PMI, chest non-tender; regular rate and rhythm, S1, S2 without murmurs, rubs or gallops. Carotid upstroke normal, no bruit. Normal abdominal aortic size, no bruits. Femorals normal pulses, no bruits. Pedals normal pulses. No edema, no varicosities. Extremities:  No clubbing or cyanosis. Neurologic:  Alert and oriented x 3.   Nuclear ETT  Procedure date:  03/29/2009  Findings:      Overall Impression   Exercise Capacity: Lexiscan study.  BP Response: Normal blood pressure response. Clinical Symptoms: Nausea.  ECG Impression: No significant ST segment change suggestive of ischemia. Overall Impression: Mild mid-anterior defect, slightly worse with stress.  Suspect that this is breast attenuation.  Normal systolic function.  Overall low risk study.  Signed by Marca Ancona, MD on 03/29/2009 at 9:54 PM  Impression & Recommendations:  Problem # 1:  CORONARY ARTERY DISEASE (ICD-414.00) Has known CAD and present with chest pain.  We deferred cath procedure largely because of neurologic symptoms which proved likely to be from migraine per neurology evaluation.  Coronary anatomy reviewed.  During the delay in the hospital, she had no further symptoms.  She was able to ambulate without any difficulty.  Her nuclear scan looks largely unchanged, and certainly is not a high risk scan.  Small defect likely is breast attenuation.  I would favor contiuing to monitor her.  She is not a good candidate for PCI in particular because of her anemia, and  GLAVE.  She would not tolerated clopidogrel and ASA. Her updated medication list for this problem includes:    Norvasc 5 Mg Tabs (Amlodipine besylate) .[name]... Take 1 tablet by mouth once a day    Aspirin 81 Mg Tbec (Aspirin) .[name]... Take one tablet by mouth daily    Nitrostat 0.4 Mg Subl (Nitroglycerin) .[name]... 1 tablet under tongue at onset of chest pain; you may repeat every 5 minutes for up to 3 doses.    Metoprolol Tartrate 25 Mg Tabs (Metoprolol tartrate) .[name]... Take one tablet by mouth twice a day  Problem # 2:  ? of GASTRIC ANTRAL VASCULAR ECTASIA (ICD-537.82) Have encouraged her to follow up with Dr. Leone Payor.    Problem # 3:  DYSLIPIDEMIA (ICD-272.4) Currently on no meds, as poor tolerance of reasonable alternatives.  Problem # 4:  HEADACHE, CHRONIC, HX OF (ICD-V15.9) Thoought to have migraine.  Continue to follow with Dr. Vickey Huger.  Patient Instructions: 1)  Your physician recommends that you schedule a follow-up appointment in: 1 MONTH 2)  Please schedule a follow-up appointment with Dr Vickey Huger 3)  Your physician recommends that you continue on your current medications as directed. Please refer to the Current Medication list given to you today.

## 2010-04-18 NOTE — Procedures (Signed)
Summary: Upper Endoscopy  Patient: Ceriah [name] Note: All result statuses are Final unless otherwise noted.  Tests: (1) Upper Endoscopy (EGD)   EGD Upper Endoscopy       DONE     [facility]     [address] [place], [place]  [address]           ENDOSCOPY PROCEDURE REPORT           PATIENT:  [name], [name]  MR#:  [id number]     BIRTHDATE:  12/05/1935, 73 yrs. old  GENDER:  female           ENDOSCOPIST:  Iva Boop, MD, [facility]           PROCEDURE DATE:  05/01/2009     PROCEDURE:  EGD with lesion ablation     ASA CLASS:  Class III     INDICATIONS:  anemia - she has a chronic blood loss anemia from     known Gastric Antral Vascular Ectasia and this limits ability to     use anti-platelet agents and anti-coagulants with her CAD     For ablation of GAVE           MEDICATIONS:   Fentanyl 60 mcg IV, Versed 6 mg     TOPICAL ANESTHETIC:  Cetacaine Spray           DESCRIPTION OF PROCEDURE:   After the risks benefits and     alternatives of the procedure were thoroughly explained, informed     consent was obtained.  The EG-[id number] ([id number]) endoscope was     introduced through the mouth and advanced to the second portion of     the duodenum, without limitations.  The instrument was slowly     withdrawn as the mucosa was fully examined.     <<PROCEDUREIMAGES>>           GAVE (gastric antral vascular ectasia) in the antrum. Argon plasma     coagulation ablation was performed. setting of 40, 1L flow used     with circumferential probe to cauterize and ablate visible     vascular ectasia  Otherwise the examination was normal.     Retroflexed views revealed no abnormalities.    The scope was then     withdrawn from the patient and the procedure completed.           COMPLICATIONS:  None           ENDOSCOPIC IMPRESSION:     1) GAVE (gastric antral vascular ectasia) in the antrum -     ablated today - first attempt     2) Otherwise normal examination     RECOMMENDATIONS:     We will contact her to schedule another EGD with reassessment     and and repeat ablation in 3-4 weeks. She will stay on her     omeprazole.           REPEAT EXAM:  In 3 - 4 week(s) for EGD.           Iva Boop, MD, [facility]           CC:  Laurann Montana, MD     Herby Abraham, MD     Zenovia Jordan, MD     The Patient           n.     eSIGNED:   Iva Boop at 05/01/2009 09:27 AM  [name], [name], [id number]  Note: An exclamation mark (!) indicates a result that was not dispersed into the flowsheet. Document Creation Date: 05/01/2009 9:28 AM _______________________________________________________________________  (1) Order result status: Final Collection or observation date-time: 05/01/2009 09:19 Requested date-time:  Receipt date-time:  Reported date-time:  Referring Physician:   Ordering Physician: Stan Head [phone]) Specimen Source:  Source: Launa Grill Order Number: [id number] Lab site:   Appended Document: Upper Endoscopy scheduled for 05-29-09 8:30 with patient .  All verbal instructions reviewed with pt  Appended Document: Upper Endoscopy reviewed.  TS

## 2010-04-18 NOTE — Letter (Signed)
Summary: Office Visit Letter  [facility]  [address] [place], [place] [address]   Phone: [phone]  Fax: [phone]      Aug 03, 2009 MRN: [id number]   [name] [address] [place], [place]  [address]   Dear Ms. Yackley,   According to our records, it is time for you to schedule a follow-up office visit with [place].   At your convenience, please call [phone] (option #2)to schedule an office visit. If you have any questions, concerns, or feel that this letter is in error, we would appreciate your call.   Sincerely,  Iva Boop, M.D.  [facility] [phone]

## 2010-04-18 NOTE — Procedures (Signed)
Summary: Upper Endoscopy  Patient: [name] Note: All result statuses are Final unless otherwise noted.  Tests: (1) Upper Endoscopy (EGD)   EGD Upper Endoscopy       DONE     [facility]     [address] [place], [place]  [address]           ENDOSCOPY PROCEDURE REPORT           PATIENT:  [name], [name]  MR#:  [id number]     BIRTHDATE:  Aug 06, 1935, 73 yrs. old  GENDER:  female           ENDOSCOPIST:  Iva Boop, MD, [facility]           PROCEDURE DATE:  05/29/2009     PROCEDURE:  EGD with lesion ablation     ASA CLASS:  Class III     INDICATIONS:  anemia from chronic blood loss due to GAVE - for     repeat ablation of GAVE           MEDICATIONS:   Fentanyl 50 mcg, Versed 6 mg     TOPICAL ANESTHETIC:  Cetacaine Spray           DESCRIPTION OF PROCEDURE:   After the risks benefits and     alternatives of the procedure were thoroughly explained, informed     consent was obtained.  The  endoscope was introduced through the     mouth and advanced to the second portion of the duodenum, without     limitations.  The instrument was slowly withdrawn as the mucosa     was fully examined.     <<PROCEDUREIMAGES>>           GAVE (gastric antral vascular ec in the antrum. Suspect somewhat     less involved than 1 month ago but still with significant     involvement. Argon plasma coagulation ablation was performed.     setting of 60 and 1L flow  Otherwise the examination was normal.     Retroflexed views revealed no abnormalities.    The scope was then     withdrawn from the patient and the procedure completed.           COMPLICATIONS:  None           ENDOSCOPIC IMPRESSION:     1) GAVE (gastric antral vascular ec in the antrum) - ablated     with setting of 60 today     2) Otherwise normal examination     RECOMMENDATIONS:     1) continue current medications     2) continue PPI     repeat ablation in April, my office will call to arrange           REPEAT EXAM:  In 1  month(s) for EGD.           Iva Boop, MD, [facility]           CC:  The Patient, Herby Abraham, MD, Laurann Montana, MD           n.     Rosalie Doctor:   Iva Boop at 05/29/2009 09:36 AM           Pearline Cables, [id number]  Note: An exclamation mark (!) indicates a result that was not dispersed into the flowsheet. Document Creation Date: 05/29/2009 9:37 AM _______________________________________________________________________  (1) Order result status: Final Collection or observation date-time: 05/29/2009 09:13  Requested date-time:  Receipt date-time:  Reported date-time:  Referring Physician:   Ordering Physician: Stan Head [phone]) Specimen Source:  Source: Launa Grill Order Number: [id number] Lab site:

## 2010-04-18 NOTE — Assessment & Plan Note (Signed)
Summary: 2 month rov/sl   Visit Type:  2 months follow up Referring Provider:  n/a Primary Provider:  Laurann Montana, MD    History of Present Illness: She saw Dr. Leone Payor. She had second procedure.  She developed nausea.  He suggested repeat in April, and she is not interested at present in doing it.  Dr. Leone Payor told them she went a little deeper, and she had black stools.  She had a repeated blood count.  She had chest and stomach pain.  That is all improved at this point.    Current Medications (verified): 1)  Norvasc 5 Mg Tabs (Amlodipine Besylate) .... Take 1 Tablet By Mouth Two Times A Day 2)  Omeprazole 20 Mg Cpdr (Omeprazole) .... Take One Tablet By Mouth Once Daily. 3)  Glucosamine Chondr 1500 Complx  Caps (Glucosamine-Chondroit-Vit C-Mn) .... Take 1/2 Cap Daily 4)  Mone Vie Juice .[name].. 2 Oz. Daily 5)  Tylenol Extra Strength 500 Mg Tabs (Acetaminophen) .... As Needed 6)  Temazepam 15 Mg Caps (Temazepam) .... As Needed 7)  Claritin 10 Mg Tabs (Loratadine) .... As Needed 8)  Cvs Iron 325 (65 Fe) Mg Tabs (Ferrous Sulfate) .... Take 1 Tablet By Mouth Once A Day 9)  Grape Seed Extract 100 Mg Caps (Grape Seed) .... Take One Tablet By Mouth Once Daily. 10)  Nitrostat 0.4 Mg Subl (Nitroglycerin) .[name].. 1 Tablet Under Tongue At Onset of Chest Pain; You May Repeat Every 5 Minutes For Up To 3 Doses. 11)  Metoprolol Tartrate 25 Mg Tabs (Metoprolol Tartrate) .... Take One Tablet By Mouth Twice A Day 12)  Nystatin 100000 Unit/ml Susp (Nystatin) .... As Needed 13)  Hydrocodone-Homatropine 5-1.5 Mg/45ml Syrp (Hydrocodone-Homatropine) .... As Needed  Allergies: 1)  ! Crestor (Rosuvastatin Calcium) 2)  ! Vytorin (Ezetimibe-Simvastatin) 3)  ! Zetia (Ezetimibe) 4)  ! Morphine  Past History:  Past Medical History: Last updated: 06/13/2009 SUPRAVENTRICULAR TACHYCARDIA (ICD-427.89) CORONARY ARTERY DISEASE (ICD-414.00) DYSLIPIDEMIA (ICD-272.4)) Hx of ENDOMETRIOSIS (ICD-617.9) GERD  (ICD-530.81) COLONIC POLYPS, ADENOMATOUS, HX OF (ICD-V12.72) RIGHT BUNDLE BRANCH BLOCK (ICD-426.4) IRON DEFICIENCY ANEMIA SECONDARY TO BLOOD LOSS (ICD-280.0) OSTEOARTHRITIS (ICD-715.90) RAYNAUD'S SYNDROME (ICD-443.0) GASTRIC ANTRAL VASCULAR ECTASIA  ? 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 Her mother died with heart failure. She had diabetes mellitus and renal abnormalities. Her father died from heart problems. She has three brothers and there were four sisters, one sister is diceased secondary tp breast cancer, and another sister has breast cancer status- post mastectomy. No FH of Colon Cancer:  Social History: Last updated: 07/11/2008  Patient does not smoke or drink.  She lives in [place]  with her husband. Illicit Drug Use - no Daily Caffeine Use  soft drink  Vital Signs:  Patient profile:   75 year old female Height:      64 inches Weight:      158 pounds BMI:     27.22 Pulse rate:   60 / minute Pulse rhythm:   regular Resp:     18 per minute BP sitting:   112 / 78  (left arm) Cuff size:   large  Vitals Entered By: Vikki Ports (June 25, 2009 10:42 AM)  Physical Exam  General:  Well  developed, well nourished, in no acute distress. Head:  normocephalic and atraumatic Eyes:  PERRLA/EOM intact; conjunctiva and lids normal. Neck:  Neck supple, no JVD. No masses, thyromegaly or abnormal cervical nodes. Lungs:  Clear bilaterally to auscultation and percussion. Heart:  PMI non displaced.  Normal S1 and S2.   Abdomen:  Bowel sounds positive;  abdomen soft and non-tender without masses, organomegaly, or hernias noted. No hepatosplenomegaly. Pulses:  pulses normal in all 4 extremities Extremities:  No clubbing or cyanosis. Neurologic:  Alert and oriented x 3.   EKG  Procedure date:  06/25/2009  Findings:      SB.  WNL.  Impression & Recommendations:  Problem # 1:  CORONARY ARTERY DISEASE (ICD-414.00)  Had chest pain after endo, but better and no further.  Not sure this is ischemic.   Her updated medication list for this problem includes:    Norvasc 5 Mg Tabs (Amlodipine besylate) .[name]... Take 1 tablet by mouth two times a day    Nitrostat 0.4 Mg Subl (Nitroglycerin) .[name]... 1 tablet under tongue at onset of chest pain; you may repeat every 5 minutes for up to 3 doses.    Metoprolol Tartrate 25 Mg Tabs (Metoprolol tartrate) .[name]... Take one tablet by mouth twice a day  Orders: EKG w/ Interpretation (93000)  Problem # 2:  DYSLIPIDEMIA (ICD-272.4) Unable to tolerate  Problem # 3:  IRON DEFICIENCY ANEMIA SECONDARY TO BLOOD LOSS (ICD-280.0) probably secondary to GAVE.  Still following with Dr. Leone Payor.  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  Problem # 5:  GERD (ICD-530.81)  Her updated medication list for this problem includes:    Omeprazole 20 Mg Cpdr (Omeprazole) .[name]... Take one tablet by mouth once daily.  Patient Instructions: 1)  Your physician recommends that you schedule a follow-up appointment in: 3 months 2)  Your physician recommends that you continue on your current medications as directed. Please refer to the Current Medication list given to you today.

## 2010-05-27 ENCOUNTER — Encounter: Payer: Self-pay | Admitting: Cardiology

## 2010-05-27 ENCOUNTER — Ambulatory Visit (INDEPENDENT_AMBULATORY_CARE_PROVIDER_SITE_OTHER): Payer: Medicare Other | Admitting: Cardiology

## 2010-05-27 DIAGNOSIS — I2510 Atherosclerotic heart disease of native coronary artery without angina pectoris: Secondary | ICD-10-CM

## 2010-06-02 LAB — CBC
HCT: 28.7 % — ABNORMAL LOW (ref 36.0–46.0)
HCT: 29.3 % — ABNORMAL LOW (ref 36.0–46.0)
HCT: 29.8 % — ABNORMAL LOW (ref 36.0–46.0)
Hemoglobin: 9.6 g/dL — ABNORMAL LOW (ref 12.0–15.0)
Hemoglobin: 9.8 g/dL — ABNORMAL LOW (ref 12.0–15.0)
Hemoglobin: 9.8 g/dL — ABNORMAL LOW (ref 12.0–15.0)
MCHC: 32.9 g/dL (ref 30.0–36.0)
MCHC: 33.2 g/dL (ref 30.0–36.0)
MCHC: 33.3 g/dL (ref 30.0–36.0)
MCV: 85.7 fL (ref 78.0–100.0)
MCV: 85.7 fL (ref 78.0–100.0)
Platelets: 186 10*3/uL (ref 150–400)
RBC: 3.42 MIL/uL — ABNORMAL LOW (ref 3.87–5.11)
RBC: 3.47 MIL/uL — ABNORMAL LOW (ref 3.87–5.11)
RDW: 15 % (ref 11.5–15.5)
RDW: 15.7 % — ABNORMAL HIGH (ref 11.5–15.5)
WBC: 4.7 10*3/uL (ref 4.0–10.5)
WBC: 5.2 10*3/uL (ref 4.0–10.5)

## 2010-06-02 LAB — BASIC METABOLIC PANEL
CO2: 27 mEq/L (ref 19–32)
GFR calc non Af Amer: 55 mL/min — ABNORMAL LOW (ref >60)
Glucose, Bld: 94 mg/dL (ref 70–99)
Potassium: 4.3 mEq/L (ref 3.5–5.1)
Potassium: 4.7 mEq/L (ref 3.5–5.1)
Sodium: 141 mEq/L (ref 135–145)
Sodium: 142 mEq/L (ref 135–145)

## 2010-06-02 LAB — HEMOCCULT GUIAC POC 1CARD (OFFICE)
Fecal Occult Bld: POSITIVE
Fecal Occult Bld: POSITIVE

## 2010-06-07 ENCOUNTER — Other Ambulatory Visit (HOSPITAL_COMMUNITY): Payer: Self-pay | Admitting: Family Medicine

## 2010-06-07 DIAGNOSIS — I2510 Atherosclerotic heart disease of native coronary artery without angina pectoris: Secondary | ICD-10-CM

## 2010-06-10 ENCOUNTER — Ambulatory Visit (HOSPITAL_COMMUNITY): Payer: Medicare Other | Attending: Family Medicine | Admitting: Radiology

## 2010-06-10 ENCOUNTER — Other Ambulatory Visit (HOSPITAL_COMMUNITY): Payer: Self-pay | Admitting: Cardiology

## 2010-06-10 DIAGNOSIS — I4510 Unspecified right bundle-branch block: Secondary | ICD-10-CM | POA: Insufficient documentation

## 2010-06-10 DIAGNOSIS — I7300 Raynaud's syndrome without gangrene: Secondary | ICD-10-CM | POA: Insufficient documentation

## 2010-06-10 DIAGNOSIS — M3500 Sicca syndrome, unspecified: Secondary | ICD-10-CM | POA: Insufficient documentation

## 2010-06-10 DIAGNOSIS — I080 Rheumatic disorders of both mitral and aortic valves: Secondary | ICD-10-CM | POA: Insufficient documentation

## 2010-06-10 DIAGNOSIS — I2510 Atherosclerotic heart disease of native coronary artery without angina pectoris: Secondary | ICD-10-CM | POA: Insufficient documentation

## 2010-06-10 DIAGNOSIS — I959 Hypotension, unspecified: Secondary | ICD-10-CM | POA: Insufficient documentation

## 2010-06-13 NOTE — Assessment & Plan Note (Signed)
Summary: f89m  r/s from 03-22-10--MD  conflict/lwb   Visit Type:  Follow-up Referring Provider:  n/a Primary Provider:  Laurann Montana, MD   CC:  Low blood pressure.  History of Present Illness: Her meds have been cut back.  She stopped taking her iron.  She continues to remain tired.  Denies chest pain.  Had two or three treatments at [facility].    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 of Esophageal Motility Disorder  (ICD-530.5) 16)  Headache, Chronic, Hx of  (ICD-V15.9) 17)  Acne Rosacea  (ICD-695.3)  Current Medications (verified): 1)  Norvasc 5 Mg Tabs (Amlodipine Besylate) .... Take 1 Tablet By Mouth Once A Day 2)  Omeprazole 20 Mg Cpdr (Omeprazole) .... Take One Tablet By Mouth Once Daily. 3)  Glucosamine Chondr 1500 Complx  Caps (Glucosamine-Chondroit-Vit C-Mn) .... Take 1 Capsule By Mouth Once A Day 4)  Tylenol Extra Strength 500 Mg Tabs (Acetaminophen) .... As Needed 5)  Temazepam 15 Mg Caps (Temazepam) .... As Needed 6)  Claritin 10 Mg Tabs (Loratadine) .... As Needed 7)  Grape Seed Extract 100 Mg Caps (Grape Seed) .... Take One Tablet By Mouth Once Daily. 8)  Nitrostat 0.4 Mg Subl (Nitroglycerin) .[name].. 1 Tablet Under Tongue At Onset of Chest Pain; You May Repeat Every 5 Minutes For Up To 3 Doses. 9)  Metoprolol Tartrate 25 Mg Tabs (Metoprolol Tartrate) .... Take 1/2 Tablet Two Times A Day 10)  Nystatin 100000 Unit/ml Susp  (Nystatin) .... As Needed 11)  Multivitamin Chewbable .... Two Times A Day 12)  Flax Seed Oil 1000 Mg Caps (Flaxseed (Linseed)) .... Take 1 Capsule By Mouth Once A Day  Allergies: 1)  ! Crestor (Rosuvastatin Calcium) 2)  ! Vytorin (Ezetimibe-Simvastatin) 3)  ! Zetia (Ezetimibe) 4)  ! Morphine  Vital Signs:  Patient profile:   75 year old female Height:      153 inches Weight:      153.25 pounds BMI:     4.62 Pulse rate:   62 / minute Pulse rhythm:   regular Resp:     18 per minute BP sitting:   114 / 72  (left arm) Cuff size:   large  Vitals Entered By: Vikki Ports (May 27, 2010 12:51 PM)  Physical Exam  General:  Well developed, well nourished, in no acute distress. Head:  normocephalic and atraumatic Eyes:  PERRLA/EOM intact; conjunctiva and lids normal. Lungs:  Clear bilaterally to auscultation and percussion. Heart:  PMI non displaced.  Normal S1 and S2.  Early diastolic murmur, miinimal.  Abdomen:  Bowel sounds positive; abdomen soft and non-tender without masses, organomegaly, or hernias noted. No hepatosplenomegaly. Pulses:  pulses normal in all 4 extremities Extremities:  No clubbing or cyanosis. Neurologic:  Alert and oriented x 3.   Echocardiogram  Procedure date:  08/11/2006  Findings:      SUMMARY   -  Overall left ventricular systolic function was normal. Left  ventricular ejection fraction was estimated , range being 55         % to 60 %. There was no diagnostic evidence of left         ventricular regional wall motion abnormalities.   -  Aortic valve thickness was mildly increased. There was mild         aortic valvular regurgitation.   -  There was mild mitral valvular regurgitation.   -  The left atrium was mildly dilated.  EKG  Procedure date:  05/27/2010  Findings:      NSR.  WNL.  Impression & Recommendations:  Problem # 1:  CORONARY ARTERY DISEASE (ICD-414.00) appears to be stable at the present time.  No chest pain .  She  seems stable, so in light of fatigue will back away from her beta blocker as a possible culprit.  Should she have any more symptoms she is to let us know.   Her updated medication list for this problem includes:    Norvasc 5 Mg Tabs (Amlodipine besylate) .[name]... Take 1 tablet by mouth once a day    Nitrostat 0.4 Mg Subl (Nitroglycerin) .[name]... 1 tablet under tongue at onset of chest pain; you may repeat every 5 minutes for up to 3 doses.    Metoprolol Tartrate 25 Mg Tabs (Metoprolol tartrate) .[name]... Take 1/2 tablet two times a day  Problem # 2:  DYSLIPIDEMIA (ICD-272.4) Cannot tolerate statins.    Problem # 3:  IRON DEFICIENCY ANEMIA SECONDARY TO BLOOD LOSS (ICD-280.0) Had GAVE and is being followed by GI.    Other Orders: EKG w/ Interpretation (93000) Echocardiogram (Echo)  Patient Instructions: 1)  Decrease Metoprolol to 1/2 tab for 1 week then stop 2)  Your physician has requested that you have an echocardiogram.  Echocardiography is a painless test that uses sound waves to create images of your heart. It provides your doctor with information about the size and shape of your heart and how well your heart's chambers and valves are working.  This procedure takes approximately one hour. There are no restrictions for this procedure. 3)  Your physician wants you to follow-up in:  6 months.  You will receive a reminder letter in the mail two months in advance. If you don't receive a letter, please call our office to schedule the follow-up appointment.

## 2010-06-14 ENCOUNTER — Other Ambulatory Visit: Payer: Self-pay | Admitting: Family Medicine

## 2010-06-14 DIAGNOSIS — N644 Mastodynia: Secondary | ICD-10-CM

## 2010-06-17 LAB — CK TOTAL AND CKMB (NOT AT ARMC)
CK, MB: 2.1 ng/mL (ref 0.3–4.0)
Relative Index: INVALID (ref 0.0–2.5)
Relative Index: INVALID (ref 0.0–2.5)
Total CK: 45 U/L (ref 7–177)

## 2010-06-17 LAB — CBC
HCT: 30.1 % — ABNORMAL LOW (ref 36.0–46.0)
HCT: 31.6 % — ABNORMAL LOW (ref 36.0–46.0)
HCT: 36.1 % (ref 36.0–46.0)
Hemoglobin: 12 g/dL (ref 12.0–15.0)
Hemoglobin: 9.9 g/dL — ABNORMAL LOW (ref 12.0–15.0)
MCHC: 32.5 g/dL (ref 30.0–36.0)
MCHC: 33 g/dL (ref 30.0–36.0)
MCHC: 33.2 g/dL (ref 30.0–36.0)
MCV: 84.8 fL (ref 78.0–100.0)
MCV: 85.1 fL (ref 78.0–100.0)
MCV: 85.6 fL (ref 78.0–100.0)
MCV: 86 fL (ref 78.0–100.0)
Platelets: 187 10*3/uL (ref 150–400)
Platelets: 188 10*3/uL (ref 150–400)
Platelets: 257 10*3/uL (ref 150–400)
RBC: 3.52 MIL/uL — ABNORMAL LOW (ref 3.87–5.11)
RBC: 3.58 MIL/uL — ABNORMAL LOW (ref 3.87–5.11)
RBC: 3.72 MIL/uL — ABNORMAL LOW (ref 3.87–5.11)
RBC: 4.24 MIL/uL (ref 3.87–5.11)
RDW: 15.1 % (ref 11.5–15.5)
RDW: 15.7 % — ABNORMAL HIGH (ref 11.5–15.5)
WBC: 4.8 10*3/uL (ref 4.0–10.5)
WBC: 5.8 10*3/uL (ref 4.0–10.5)
WBC: 6.2 10*3/uL (ref 4.0–10.5)

## 2010-06-17 LAB — URINALYSIS, ROUTINE W REFLEX MICROSCOPIC
Hgb urine dipstick: NEGATIVE
Nitrite: NEGATIVE
Protein, ur: NEGATIVE mg/dL
Specific Gravity, Urine: 1.013 (ref 1.005–1.030)
Urobilinogen, UA: 0.2 mg/dL (ref 0.0–1.0)

## 2010-06-17 LAB — DIFFERENTIAL
Basophils Absolute: 0 10*3/uL (ref 0.0–0.1)
Basophils Absolute: 0 10*3/uL (ref 0.0–0.1)
Basophils Relative: 0 % (ref 0–1)
Basophils Relative: 1 % (ref 0–1)
Eosinophils Absolute: 0.1 10*3/uL (ref 0.0–0.7)
Eosinophils Absolute: 0.1 10*3/uL (ref 0.0–0.7)
Monocytes Absolute: 0.6 10*3/uL (ref 0.1–1.0)
Monocytes Relative: 11 % (ref 3–12)
Monocytes Relative: 8 % (ref 3–12)
Neutro Abs: 3.6 10*3/uL (ref 1.7–7.7)
Neutro Abs: 4.7 10*3/uL (ref 1.7–7.7)
Neutrophils Relative %: 62 % (ref 43–77)
Neutrophils Relative %: 76 % (ref 43–77)

## 2010-06-17 LAB — HEPARIN LEVEL (UNFRACTIONATED): Heparin Unfractionated: 0.45 IU/mL (ref 0.30–0.70)

## 2010-06-17 LAB — TROPONIN I
Troponin I: 0.05 ng/mL (ref 0.00–0.06)
Troponin I: 0.07 ng/mL — ABNORMAL HIGH (ref 0.00–0.06)

## 2010-06-17 LAB — LIPID PANEL
Cholesterol: 172 mg/dL (ref 0–200)
HDL: 59 mg/dL (ref >39)
LDL Cholesterol: 91 mg/dL (ref 0–99)
Total CHOL/HDL Ratio: 2.9 RATIO
Triglycerides: 108 mg/dL (ref <150)
VLDL: 22 mg/dL (ref 0–40)

## 2010-06-17 LAB — COMPREHENSIVE METABOLIC PANEL
ALT: 15 U/L (ref 0–35)
AST: 18 U/L (ref 0–37)
Albumin: 3.1 g/dL — ABNORMAL LOW (ref 3.5–5.2)
Alkaline Phosphatase: 50 U/L (ref 39–117)
BUN: 13 mg/dL (ref 6–23)
CO2: 23 mEq/L (ref 19–32)
Calcium: 8.8 mg/dL (ref 8.4–10.5)
Chloride: 112 mEq/L (ref 96–112)
Creatinine, Ser: 0.82 mg/dL (ref 0.4–1.2)
GFR calc Af Amer: >60 mL/min (ref >60)
GFR calc non Af Amer: >60 mL/min (ref >60)
Glucose, Bld: 88 mg/dL (ref 70–99)
Potassium: 3.9 mEq/L (ref 3.5–5.1)
Sodium: 139 mEq/L (ref 135–145)
Total Bilirubin: 0.3 mg/dL (ref 0.3–1.2)
Total Protein: 5.4 g/dL — ABNORMAL LOW (ref 6.0–8.3)

## 2010-06-17 LAB — BASIC METABOLIC PANEL
BUN: 11 mg/dL (ref 6–23)
CO2: 26 mEq/L (ref 19–32)
Calcium: 9.3 mg/dL (ref 8.4–10.5)
Creatinine, Ser: 0.81 mg/dL (ref 0.4–1.2)
GFR calc Af Amer: >60 mL/min (ref >60)

## 2010-06-17 LAB — URINE MICROSCOPIC-ADD ON

## 2010-06-17 LAB — POCT CARDIAC MARKERS
CKMB, poc: 1.9 ng/mL (ref 1.0–8.0)
Myoglobin, poc: 134 ng/mL (ref 12–200)
Troponin i, poc: <0.05 ng/mL (ref 0.00–0.09)

## 2010-06-17 LAB — TSH: TSH: 1.898 u[IU]/mL (ref 0.350–4.500)

## 2010-06-17 LAB — PROTIME-INR
INR: 1.12 (ref 0.00–1.49)
Prothrombin Time: 14.3 seconds (ref 11.6–15.2)

## 2010-06-18 ENCOUNTER — Ambulatory Visit
Admission: RE | Admit: 2010-06-18 | Discharge: 2010-06-18 | Disposition: A | Discharge status: Home / Self Care | Payer: Medicare Other | Source: Ambulatory Visit | Attending: Family Medicine | Admitting: Family Medicine

## 2010-06-18 ENCOUNTER — Other Ambulatory Visit: Payer: Self-pay | Admitting: Family Medicine

## 2010-06-18 DIAGNOSIS — Z0000 Encounter for general adult medical examination without abnormal findings: Secondary | ICD-10-CM

## 2010-06-18 DIAGNOSIS — N644 Mastodynia: Secondary | ICD-10-CM

## 2010-06-19 ENCOUNTER — Telehealth: Payer: Self-pay | Admitting: Cardiology

## 2010-06-19 NOTE — Telephone Encounter (Signed)
I called patient regarding the results.  She is aware of the findings. Repeat echo in one year was suggested due to the valvular regurgitation.  I have encouraged her to remain active.  TS

## 2010-06-19 NOTE — Telephone Encounter (Signed)
Dr Riley Kill told pt he would call her today and wanted to have him call on her cell

## 2010-07-02 ENCOUNTER — Other Ambulatory Visit: Payer: Self-pay | Admitting: Internal Medicine

## 2010-07-02 DIAGNOSIS — E049 Nontoxic goiter, unspecified: Secondary | ICD-10-CM

## 2010-07-02 DIAGNOSIS — Z8349 Family history of other endocrine, nutritional and metabolic diseases: Secondary | ICD-10-CM

## 2010-07-11 ENCOUNTER — Ambulatory Visit
Admission: RE | Admit: 2010-07-11 | Discharge: 2010-07-11 | Disposition: A | Discharge status: Home / Self Care | Payer: Medicare Other | Source: Ambulatory Visit | Attending: Internal Medicine | Admitting: Internal Medicine

## 2010-07-11 DIAGNOSIS — Z8349 Family history of other endocrine, nutritional and metabolic diseases: Secondary | ICD-10-CM

## 2010-07-11 DIAGNOSIS — E049 Nontoxic goiter, unspecified: Secondary | ICD-10-CM

## 2010-07-30 NOTE — Assessment & Plan Note (Signed)
[name]                         GASTROENTEROLOGY OFFICE NOTE   [name], [name]                        MRN:          [id number]  DATE:02/02/2007                            DOB:          [birth year]    CHIEF COMPLAINT:  Follow-up after colonoscopy and EGD.   [name] had two tubular adenomas and needs a colonoscopy in 5 years.  She has had iron-deficiency anemia due to chronic occult blood loss and  the endoscopic evaluation with biopsies indicates gastric antral  vascular ectasia.  I explained this to her today.  I think she is  chronically leaking blood from this.  She had a borderline ferritin and  a hemoglobin of 11 in August.  She has had some melena in the past.  She  is not on any NSAIDs at this time but she does take an aspirin once a  day.  See my medical history from November 09, 2006, for other past  medical history.   Her medications are listed and reviewed in the chart.   ASSESSMENT:  1. Gastric antral vascular ectasia, which has led to chronic occult      blood loss and some melena at times.  2. Benign tubular adenomas of the colon.   PLAN:  I think it is appropriate to treat this with argon plasma  coagulation ablation.  I explained the rationale and risks and benefits  of this.  It would take 3-5 procedures over a year, I think, hopefully  over 6-8 months keeping the interval 1-2 months.  She is going to think  about this.  I will give her some literature on this.  We will check a  CBC and a ferritin today and she still stay on iron.  She could  potentially need an iron infusion, though we will see how she does with  the supplementation.  Her hemoglobin is not that low.   I did explain that she may maintain on iron alone but my concern is if  she needs anticoagulants in the future or if she needs another  antiplatelet drug or if she has a major GI bleed from this, things could  get much worse.     Iva Boop,  MD,FACG  Electronically Signed    CEG/MedQ  DD: 02/02/2007  DT: 02/02/2007  Job #: [id number]   cc:   Stacie Acres. Cliffton Asters, M.D.  Arturo Morton. Riley Kill, MD, [facility]  Demetria Pore. Coral Spikes, M.D.  Velora Heckler, MD

## 2010-07-30 NOTE — Assessment & Plan Note (Signed)
[facility]                            CARDIOLOGY OFFICE NOTE   JACYNDA, BRUNKE                        MRN:          [id number]  DATE:12/28/2007                            DOB:          [birth year]    Kody is in for followup.  In general, her major complaint has been that  of rather continued fatigue.  She has had a little bit of what she  describes as some vertigo.  She reduced her dose today.  She just feels  fairly wiped out much of the time.  She denies any chest pain and rarely  has any skips.  She does not have any presyncope.   Most recent hemoglobin was 12.2 fortunately.  Her fasting TSH also was  normal at 1.26.   MEDICATIONS:  1. Citracal.  2. Iron 65 mg t.i.d.  3. Norvasc five daily.  4. Prilosec 20 mg one-half tablet daily.  5. Glucosamine half tablet daily.  6. Monavie juice.   PHYSICAL EXAMINATION:  GENERAL:  Today on physical, she appears well.  We had a nice discussion.  VITAL SIGNS:  The weight is 179 pounds, blood pressure 116/80, the pulse  is 64.  LUNGS:  The lung fields are really quite clear.  CARDIAC:  Rhythm is regular.  EXTREMITIES:  Trace edema.   Recent laboratory studies done by Dr. Demetria Pore. Levitin revealed a BUN of  14, creatinine of 1.9, potassium of 4.8, hemoglobin of 12.0, hematocrit  37.  Liver function studies are within normal limits.  Urinalysis was  negative.   I am not quite sure at the present time what is creating her symptoms.  She does have known coronary artery disease at some point, this may need  to be reevaluated.  She was last studied in October 2006.  She denies,  however, current chest pain.  We will continue to monitor Keshawn fairly  closely.     Arturo Morton. Riley Kill, MD, [facility]  Electronically Signed    TDS/MedQ  DD: 12/28/2007  DT: 12/28/2007  Job #: [id number]

## 2010-07-30 NOTE — Assessment & Plan Note (Signed)
[facility]                            CARDIOLOGY OFFICE NOTE   JAILEN, LUNG                        MRN:          [id number]  DATE:03/08/2008                            DOB:          Apr 10, 1935    [name] is here for followup.  She continues to have some shortness of  breath walking up steps.  We have talked about the possibility of  considering cardiac rehabilitation.  In general, she has been relatively  stable, but because of all the activities related to her family, she has  had difficulty going ahead and getting this done.  I had previously  spoken with Dr. Stan Head, but I think the patient has decided not to  have anything done with regard to her GI situation.  Her hemoglobins  have been staying up reasonably well.   CURRENT MEDICATIONS:  1. Norvasc 5 mg daily.  2. Prilosec 20 mg one-half daily.  3. Glucosamine one-half daily.  4. MonaVie juice 20 ounces daily.  5. Citrucel.  6. Vitamin D daily.   PHYSICAL EXAMINATION:  VITAL SIGNS:  Blood pressure 120/70 and pulse 80.  LUNGS:  Lung fields are clear.  CARDIAC:  Rhythm is regular.  EXTREMITIES:  There is no extremity edema.   The patient's last echocardiogram was in May 2008.  At that time, her  overall ejection fraction was 55-60%.  She has had no interval change  since then.  She had mild mitral regurgitation as well.   Last laboratories from Dr. Corky Sing office revealed a hemoglobin of  11.4, hematocrit of 34.  Her BUN and creatinine normal.  Her glucose was  84.  Urinalysis was negative.   I would like to try Derrika on a trial of cardiac rehabilitation to see if  she really is that limited.  She says she has tried some, but a regular  dose of cardiac rehabilitation would help [place] from both a diagnostic as  well as a therapeutic standpoint.  Her last catheterization in 2006  demonstrated some diffuse disease, but none of this was critical.  She  has not been able to take  statins.  We will try to get her into the  rehab program and see how things progress.     Arturo Morton. Riley Kill, MD, [facility]  Electronically Signed    TDS/MedQ  DD: 05/05/2008  DT: 05/06/2008  Job #: [id number]

## 2010-07-30 NOTE — Assessment & Plan Note (Signed)
[facility]                            CARDIOLOGY OFFICE NOTE   [name], [name]                        MRN:          [id number]  DATE:09/10/2006                            DOB:          01/12/1936    HISTORY OF PRESENT ILLNESS:  [name] is in for followup. When we last saw  her, she was complaining of increasing fatigue. She had had a borderline  anemia in the past, but in fact her MCV was normal and she has a history  of Raynaud's. She says that she has been tired all of the time and  recent lab studies that we obtained revealed a significant drop in her  hemoglobin since her last admission with an MCV that is now low. I had  her followup with Dr. Cliffton Asters, and a serum ferritin level has been low.  Her repeat hemoglobin was 10.4 and she has been referred now to Dr.  Madilyn Fireman for followup. She had a stool sample done and that was negative.  She is now on iron supplement. She has not had periods. She continue to  remain somewhat tired and sleepy at time.   PHYSICAL EXAMINATION:  VITAL SIGNS:  Weight 184 pounds. Blood pressure  120/80, pulse 72.  LUNGS:  Fields are clear.  CARDIOVASCULAR:  Rhythm is regular.   PLAN:  The patient is continuing followup with Dr. Cliffton Asters at the present  time. I think she needs continued workup and probably an endoscopy. She  may need to come off of her aspirin, although she does have well defined  underlying coronary disease. We will need to see how this turns out.     Arturo Morton. Riley Kill, MD, [facility]  Electronically Signed    TDS/MedQ  DD: 10/17/2006  DT: 10/17/2006  Job #: [id number]

## 2010-07-30 NOTE — Assessment & Plan Note (Signed)
[name]                            CARDIOLOGY OFFICE NOTE   [name], [name]                        MRN:          [id number]  DATE:02/23/2007                            DOB:          06-08-1935    SUBJECTIVE:  [name] is in for followup.  She has seen Dr. Iva Boop.  In general, she has been noted to have a antral vascular  ectasia with what is thought to be the source of bleeding.  She does  remain on aspirin at the present time and she has been taking iron  supplementation.  She denies any chest pain.  Unfortunately the patient  also cannot take statins.   CURRENT MEDICATIONS:  1. Citrucel.  2. Vitamin D.  3. Glucosamine.  4. Norvasc 5 mg daily.  5. Ferrous sulfate 325 mg daily.  6. Atenolol 25 mg daily.  7. Prilosec 20 mg daily.  8. A Bayer aspirin.   PHYSICAL EXAMINATION:  VITAL SIGNS:  Blood pressure 120/70, pulse 54.  LUNGS:  Fields are clear.  HEART:  Rhythm is regular.   Her electrocardiogram shows sinus bradycardia, otherwise unremarkable.   IMPRESSION:  1. Coronary artery disease.  2. Statin intolerance.  3. Gastric ectasia with microcytic anemia, of significant degree.  4. Dyslipidemia with statin intolerance.  5. Prior hysterectomy.  6. History of other problems, as noted.   PLAN:  We had a long discussion today.  From a cardiac standpoint,  obviously I would like to keep her on aspirin, but this is going to be  impossible.  She has been significantly anemic, and I think we are going  to need to go ahead and stop her aspirin.  She understands this.  We  have talked about it at length.   FOLLOWUP:  1. I will see her back in followup in three months.  2. She will continue followup with Dr. Stacie Acres. [name].     Arturo Morton. Riley Kill, MD, [facility]  Electronically Signed    TDS/MedQ  DD: 02/23/2007  DT: 02/23/2007  Job #: [id number]   cc:   [name] Acres. Cliffton Asters, M.D.  Iva Boop, MD,FACG

## 2010-07-30 NOTE — Assessment & Plan Note (Signed)
[facility]                            CARDIOLOGY OFFICE NOTE   REGANA, KEMPLE                        MRN:          [id number]  DATE:08/10/2007                            DOB:          [birth year]    Ms.  Polzin is in for followup.  She has not had any further surgery.  Dr. Leone Payor has increased her iron to three times a day.  He is going to  go by Dr. Lucilla Lame office and try to get a blood count.  Cardiac-wise,  she says she feels much better.  Importantly, a lot of the stress and  she was under previously has also abated at the same time. Whether or  not her hemoglobin is up based on this is unclear.  She has not had  recurrent chest pain.   PHYSICAL EXAMINATION:  VITAL SIGNS:  On exam today, blood pressure is  126/64. The pulse is 61.  LUNGS:  The lung fields are clear.  CARDIAC:  Rhythm is regular.   She will continue on the same current medical regimen.  I have  encouraged her to get her blood count done.  We will see her back in  three months' time.  Should there be any change in her status, she is to  contact us.     Arturo Morton. Riley Kill, MD, [facility]  Electronically Signed    TDS/MedQ  DD: 08/10/2007  DT: 08/10/2007  Job #: [id number]   cc:   Stacie Acres. Cliffton Asters, M.D.  Iva Boop, MD,FACG

## 2010-07-30 NOTE — Assessment & Plan Note (Signed)
 HEALTHCARE                            CARDIOLOGY OFFICE NOTE   RONIT, CRANFIELD                        MRN:          [id number]  DATE:11/10/2007                            DOB:          June 08, 1935    [name] is in for followup.  In general, she is stable.  However, she does  complaints pretty significant fatigue.  She said she has fatigue all of  the time.  She has had a recent hemoglobin that has been about 12.  Otherwise, she has no major symptoms and she has not had pressure in the  chest.   CURRENT MEDICATIONS:  1. Citrucel daily.  2. Prilosec 20 mg daily.  3. Iron 65 mg.  4. Glucosamine daily.  5. Atenolol 25 mg one half tablet daily.  6. Her Norvasc has been discontinued.   On physical today; the blood pressure is 146/100, higher than this has  been in the past; and pulse is 57.  The lung fields are clear.  The  cardiac rhythm is regular.  There is no extremity edema.  There is no  jugular venous distention.   EKG reveals sinus bradycardia and is otherwise unremarkable.   I spoke with [name] in some detail.  We will get a CBC, sed rate, and we  will also check a TSH, because she has had some weight gain.  In  addition, we will go ahead and have her stop her atenolol since she is  only on 12.5 mg a day and I will have her increase her Norvasc and  resume this at 5 mg daily.  She will return to the clinic in about 2  months in followup and let me know how she is getting along.     Arturo Morton. Riley Kill, MD, [facility]  Electronically Signed    TDS/MedQ  DD: 11/10/2007  DT: 11/11/2007  Job #: [id number]

## 2010-07-30 NOTE — Assessment & Plan Note (Signed)
[facility]                            CARDIOLOGY OFFICE NOTE   BERNISHA, VERMA                        MRN:          [id number]  DATE:04/29/2007                            DOB:          04/11/1935    [name] is in for follow-up.  She came in to talk about a situation with  regard to her most recent findings.  She has seen Dr. Leone Payor, and she  is thought to have a gastric antral vascular ectasia.  He has talked  about the possibility of treating this.  They have talked about various  things.  She has been on iron supplementation.  Her hemoglobin is now up  to 11.9.  Her ferritin level is borderline.  He is told her she could  possibly go back on aspirin.  She has underlying coronary artery disease  of moderate degree, and also has a statin intolerance.  She has not been  on antiplatelet agents recently.   Her current medications include Citrucel with vitamin D daily,  glucosamine daily, Norvasc 5 mg daily, atenolol 25 mg daily, Prilosec 20  mg daily and ferrous sulfate 324 mg b.i.d.   PHYSICAL:  She is alert and oriented in no acute distress.  The blood pressure today is 120/70, the pulse 60.  The lung fields are clear.  The cardiac rhythm is regular.  There is no extremity edema.   IMPRESSION:  1. Scattered coronary artery disease.  2. Statin intolerance.  3. Gastric antral vascular ectasia with microcytic anemia due to      bleeding.  4. Dyslipidemia on statin intolerance.  5. Hysterectomy.   PLAN:  At the present time she is to stay off aspirin.  She wants to  speak with Dr. Leone Payor.  She is asking for guidance.  I am not quite  sure what to tell her with regard to the possibilities with regard to  treatment.  The pictures are obvious.  I have talked asker speak more  with Dr. Leone Payor again and continue to review this.     Arturo Morton. Riley Kill, MD, [facility]  Electronically Signed    TDS/MedQ  DD: 04/29/2007  DT: 04/30/2007  Job #:  [id number]   cc:   Stacie Acres. Cliffton Asters, M.D.  Demetria Pore. Coral Spikes, M.D.  Iva Boop, MD,FACG

## 2010-07-30 NOTE — Assessment & Plan Note (Signed)
[facility]                            CARDIOLOGY OFFICE NOTE   ATHALEE, ESTERLINE                        MRN:          [id number]  DATE:11/02/2006                            DOB:          [birth year]    Sabra is in for a followup visit.  In general,  she is doing reasonably  well, the best she has done in some time.  She has been stressed by the  divorce of her daughter. She is also scheduled to see Dr. Stan Head  later this week.  She was noted to have a microcytic anemia and is iron  deficient.  She apparently had colonoscopy about 2 years ago, and Dr.  Cliffton Asters scheduled her to see Dr. Leone Payor.   Also, she had fasciitis. Apparently they want to put her on a  nonsteroidal, but she is reluctant to take it.  She and I reviewed the  data regarding nonsteroidals and underlying coronary disease.   CURRENT MEDICATIONS:  1. Citrucel 1 daily.  2. Glucosamine daily.  3. Aspirin 81 mg daily.  4. Prilosec 20 mg daily.  5. Amlodipine 5 mg1/2 tablet daily.  6. Atenolol 25 mg 1/2 tablet daily.  7. Gentle Iron.  8. She has been taking some Aleve.  We also talked about this.   IMPRESSION:  1. Underlying coronary artery disease.  2. Hypercholesterolemia with lipid-lowering drug intolerance.  3. Microcytic anemia with iron deficiency, uncertain etiology.  4. History of Raynaud's.  5. Positive ANA.   RECOMMENDATIONS:  1. Follow up with Dr. Leone Payor.  2. Endoscopy.  3. Current medical regimen, possibly discontinue aspirin.  4. I did talk to her about the nonsteroidals and their interaction,      but she has a difficult time with her arthritis.  She is fully      informed as to the potential risks.     Arturo Morton. Riley Kill, MD, [facility]  Electronically Signed    TDS/MedQ  DD: 11/02/2006  DT: 11/02/2006  Job #: [id number]

## 2010-07-30 NOTE — Letter (Signed)
July 14, 2007    [name] Boop, MD, [facility]  [address] [place], [place] [address]   RE:  [name], [name]  MRN:  [id number]  /  DOB:  1935/07/18   RE:  [name]   Dear [name],   I had the pleasure of seeing [name] in the office today in a follow-  up visit.  In general, she has been feeling more fatigued.  She has had  some back discomfort.  She tells me that Dr. Cliffton Asters performed a chest x-  ray and that was fine.  Her hemoglobin is, however, down and apparently  is down to 10.7 despite taking iron replacement therapy.  As you know,  she has deferred this issue with regard to gastric vascular ectasia and  ablation of that.  I am concerned that some of her symptoms may be  mediated by her anemia.  She says she just feels terrible.  She has not  had central pressure, heaviness, but how much represents anemia and how  much might represent underlying coronary disease remains uncertain.  She  has had no obvious EKG changes.  Her last cardiac catheterization was in  October of 2006.  At that time she had an LAD with 25-30% stenosis.  The  first diagonal had 30-40% stenosis.  There is a diffuse 25-35% lesion to  the proximal second circumflex and an ostial PDA of 70%.   Today on examination her blood pressure is 124/80, pulse is 57.  The lung fields are clear.  CARDIAC:  Rhythm is regular.   EKG reveals sinus bradycardia, otherwise within normal limits.   At the present time, I am not sure what to make of her symptoms.  I do  clearly think that on iron replacement therapy with a hemoglobin that is  now rising, this issue probably needs to be readdressed, and she needs  to make a decision with regard to whether or not she is going to have  therapy.  It would be helpful to know what her symptoms are like once  her hemoglobin is up to 13 or 14.  We will keep in close touch with her.  With GI bleeding she would not be a good interventional candidate and,  as I  mentioned, I am not convinced that the symptoms are cardiac in  fact.  She is to call us if there is any big change in her symptoms.    Sincerely,      Arturo Morton. Riley Kill, MD, [facility]  Electronically Signed    TDS/MedQ  DD: 07/14/2007  DT: 07/14/2007  Job #: [id number]

## 2010-07-30 NOTE — Assessment & Plan Note (Signed)
[facility]                         GASTROENTEROLOGY OFFICE NOTE   [name], [name]                        MRN:          [id number]  DATE:11/09/2006                            DOB:          1935-11-16    REASON FOR CONSULTATION:  Iron deficiency anemia.   ASSESSMENT:  A 75 year old white woman with recurrent iron deficiency  anemia over the past 10 years.  She is having problems again without  evidence of bleeding.  She does not eat much red meat but she does eat  green leafy vegetables.  Last hemoglobin was 11.4, iron saturation 17%  with TIBC 368, MCV 78 and RDQ 17%.  She has been on iron for several  months and her hemoglobin has improved.  Her ferritin was 9 in May.   GI HISTORY:  Notable for hiatal hernia/paraesophageal hernia repair.  Colonoscopy October 2004 showing internal hemorrhoids and upper GI  endoscopy showing paraesophageal hernia suspected October 2004 (Dr. Dorena Cookey).   RECOMMENDATIONS AND PLAN:  1. EGD and colonoscopy, appears she has some vague dysphagia as well,      at times some minor impact dysphagia it sounds like.  She might      need a dilation as there could be some stenosis related to her      fundoplication.  Also could have a stricture.  Want to look for      causes of chronic occult blood loss anemia.  She did indicate the      possibility of some melena recently though that has been difficult      to tell because she has chronically dark stools on iron but she      thought they looked darker.  2. Further plans pending these above studies.  She has Raynaud's      phenomenon, she could be at increased risk for AVMs/telangiectasias      but a small bowel capsule endoscopy could help with that.  She      might need to do some further Hemoccults, she tells me the ones she      has done with Dr. Cliffton Asters have been negative.   I have explained the risks, benefits and indications of upper GI  endoscopy, esophageal  dilation and colonoscopy.  She understands and  agrees to proceed.   HISTORY:  This lady has the problems as mentioned above.  She has been  using some Prilosec for heartburn and indigestion which she has had to  do ever since her hernia repair in 2005.  She has a chronic intermittent  pain in the right lower quadrant radiating to the right upper quadrant  which is worse when she has been on her feet all day and is very tired  and improves with rest.  Otherwise as above.   See medical history for full details.   MEDICATIONS:  1. Citracal daily.  2. Glucosamine 2000 mg daily.  3. Aspirin 81 mg daily.  4. Prilosec 20 mg daily.  5. Amlodipine 2.5 mg daily.  6. Atenolol 12.5 mg daily.  7. Gentle iron 25 mg daily.  8. Norvasc 2.5 mg daily.  9. Ambien p.r.n.  10.Tylenol p.r.n.   DRUG ALLERGIES:  CRESTOR, VYTORIN, ZETIA, MORPHINE.   PAST MEDICAL HISTORY:  1. Paraesophageal/hiatal hernia repair 2005, Dr. Gerrit Friends.  2. Cholecystectomy for gallbladder dyskinesia 2007, Dr. Gerrit Friends.  3. Dyslipidemia.  4. Recent prednisone for plantar fasciitis.  5. Prior hysterectomy.  6. Chronic recurrent iron deficiency anemia problems, etiology      unclear.  7. Raynaud's phenomenon.  8. Acne rosacea.  9. Insomnia.  10.Vascular and muscle contraction headaches.  11.Osteoarthritis.  12.Right bundle branch block.  13.Coronary artery disease.  14.Left rotator cuff tear.  15.Gastroesophageal reflux disease.  16.Prior left lumpectomy.  17.She had a bilateral oophorectomy as well endometriosis as a      diagnosis.  18.Tear duct surgery.  19.Presbyesophagus on barium swallow Aug 05, 2005.  20.CT December 2007, right lower quadrant pain, right renal cortical      cyst, otherwise negative post cholecystectomy.  21.Esophagogastroduodenoscopy and colonoscopy October 2004 as noted.  22.Chest pain admission October 2007.   FAMILY HISTORY:  A sister has cirrhosis, unclear etiology.  There is a  strong  combination of breast cancer, diabetes, heart disease as well.   SOCIAL HISTORY:  She is married.  She has been a Veterinary surgeon.  One son, two  daughters.  No alcohol, tobacco or drug use.   REVIEW OF SYSTEMS:  See my medical history form for full details.   PHYSICAL EXAMINATION:  Reveals middle-aged to elderly white woman.  Height 5 foot 4, weight 182 pounds, she is overweight to obese.  Her  blood pressure 118/78, pulse 60 regular.  EYES:  Anicteric.  ENT:  Normal mouth, posterior pharynx.  NECK:  Supple, no thyromegaly.  CHEST:  Clear.  HEART:  S1-S2, no murmurs, rubs or gallops.  ABDOMEN:  Soft, nontender without organomegaly or mass.  Surgical scars  noted.  RECTAL:  Exam deferred at this time.  LYMPHATIC:  No neck or supraclavicular nodes.  EXTREMITIES:  Trace peripheral edema with some varicose veins noted.  SKIN:  No acute rash that I can see.  No telangiectasias.  She is alert and oriented x3 as far as neurologic, exam is grossly  nonfocal.   I appreciate the opportunity to care for this patient.  I have reviewed  the [facility] old records, I reviewed the recent office notes and  laboratory testing and problem list kindly furnished by Dr. Cliffton Asters.  Additional labs:  C-reactive protein normal in July, cholesterol 240,  triglycerides 120, LDL 161.  LFTs are normal.  Creatinine normal.     Iva Boop, MD,FACG  Electronically Signed    CEG/MedQ  DD: 11/09/2006  DT: 11/09/2006  Job #: [id number]   cc:   Stacie Acres. Cliffton Asters, M.D.  Arturo Morton. Riley Kill, MD, [facility]  Demetria Pore. Coral Spikes, M.D.  Velora Heckler, MD

## 2010-07-30 NOTE — Assessment & Plan Note (Signed)
[facility]                         GASTROENTEROLOGY OFFICE NOTE   [name], [name]                        MRN:          [id number]  DATE:04/20/2007                            DOB:          1935-06-01    CHIEF COMPLAINT:  Follow up of chronic occult blood loss anemia from  gastric arteriovascular ectasia.   She has done well and says she feels as well as she has in a long time.  She saw Dr. Coral Spikes in January and had a hemoglobin drawn and was told  she was still a little bit anemic.  She tells me she would like to get  actual lab results when she has studies done.  She saw Dr. Riley Kill in  December and he stopped her aspirin because of the risk of chronic blood  loss.  She is still ambivalent about proceeding with argon plasma  coagulation, we have talked about this in obliteration of her antral  changes at least once before.  She is still thinking about it.  Currently her mother-in-law broke her hip and that is a logistical issue  at any rate.  Her medications are listed and reviewed in her chart and  are unchanged from her previous visit except for her aspirin being  discontinued.  Her past medical history is reviewed and unchanged from  my note of November 09, 2006, as well as my note of February 02, 2007.   Weight 183 pounds, pulse 72, blood pressure 110/64.   Note, we did discuss her chronic recurrent right lower quadrant and  groin pain.  Ultrasound, CT scanning, colonoscopy have not reviewed a  cause.  I do not think further workup is indicated.   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   PLAN:  We will check a CBC and a ferritin level and give her those  results today.  She is still considering ablation of the antrum and  these changes of vascular ectasia.  I think this is an appropriate thing  to do, though admittedly she seems okay at this time.  I do think that  she could take  aspirin at an 81 mg dose and see how she does with that,  if Dr. Riley Kill felt like she needed it.  Even if she does not have this  region of her stomach mucosa ablated.  I do not think there is one right  answer here, and she does not seem to be at risk for hemorrhage in the  face of aspirin.  I would be more concerned about Plavix and Coumadin.  At any rate, we will see what the iron and the CBC results are and go  from there.     Iva Boop, MD,FACG  Electronically Signed    CEG/MedQ  DD: 04/20/2007  DT: 04/20/2007  Job #: [id number]   cc:   Arturo Morton. Riley Kill, MD, [facility]  Stacie Acres. Cliffton Asters, M.D.  Demetria Pore. Coral Spikes, M.D.

## 2010-08-02 NOTE — Assessment & Plan Note (Signed)
[facility]                            CARDIOLOGY OFFICE NOTE   SHANEE, BATCH                        MRN:          [id number]  DATE:02/22/2008                            DOB:          21-Mar-1935    I finally was able to connect with Kathie Rhodes regarding her monitoring  report.  She did not have any specific arrhythmias.  It is important to  know that she developed a supraventricular tachycardia.  These look  predominantly like sinus rhythm and do not have specifically the  appearance of atrial flutter.  I mentioned that to her, we talked  specifically about that.  For the past couple of months, she has been  unable to exercise.  She has been somewhat dizzy and more recently,  apparently her brother-in-law died in an accident in [place].  It has  been a fairly difficult week and she is getting ready to leave and go be  with her sister-in-law.  We talked about the possibility of beginning an  exercise program again to try to improve on her exercise conditioning.  Whether this would improve her symptoms is unclear.  She does get rather  tachycardic with only minimal types of activity.  When she gets back to  town, she will restart some exercise activity and will give me a call.  We plan to see her back in the followup in the next month or two.     Arturo Morton. Riley Kill, MD, [facility]  Electronically Signed    TDS/MedQ  DD: 02/24/2008  DT: 02/25/2008  Job #: [id number]

## 2010-08-02 NOTE — Op Note (Signed)
   NAME:  [name], [name]                           ACCOUNT NO.:  [id number]   MEDICAL RECORD NO.:  [id number]                   PATIENT TYPE:  AMB   LOCATION:  ENDO                                 FACILITY:  [facility]   PHYSICIAN:  John C. Madilyn Fireman, M.D.                 DATE OF BIRTH:  26-Mar-1935   DATE OF PROCEDURE:  01/06/2003  DATE OF DISCHARGE:                                 OPERATIVE REPORT   PROCEDURE PERFORMED:  Colonoscopy with biopsy.   ENDOSCOPIST:  Barrie Folk, M.D.   INDICATIONS FOR PROCEDURE:  Rectal bleeding and heme positive stools.   DESCRIPTION OF PROCEDURE:  The patient was placed in the left lateral  decubitus position and placed on the pulse monitor with continuous low-flow  oxygen delivered by nasal cannula.  The patient was sedated with 30 mcg of  IV fentanyl and 4 mg of IV Versed.  The Olympus video colonoscope was  inserted into the rectum and advanced to the cecum, confirmed by  transillumination of McBurney's point and visualization of the ileocecal  valve and appendiceal orifice.  The prep was excellent.  The cecum,  ascending, transverse, descending and sigmoid colon appeared normal with no  masses, polyps, diverticula or other mucosal abnormalities.  The rectum  likewise appeared normal and retroflex view of the anus revealed only small  internal hemorrhoids.  The scope was then withdrawn and the patient returned  to the recovery room in stable condition.  She tolerated the procedure well.  There were no immediate complications.   IMPRESSION:  Small internal hemorrhoids.  Otherwise normal study.   PLAN:  Next colon screening by sigmoidoscopy in five years.                                                  John C. Madilyn Fireman, M.D.    JCH/MEDQ  D:  01/06/2003  T:  01/06/2003  Job:  [id number]   cc:   Stacie Acres. White, M.D.  [address]  [place]  [place] [address]  Fax: [phone]

## 2010-08-02 NOTE — Op Note (Signed)
[name], [name]                 ACCOUNT NO.:  [id number]   MEDICAL RECORD NO.:  [id number]          PATIENT TYPE:  INP   LOCATION:  5708                         FACILITY:  [facility]   PHYSICIAN:  Velora Heckler, MD      DATE OF BIRTH:  14-Sep-1935   DATE OF PROCEDURE:  04/14/2005  DATE OF DISCHARGE:                                 OPERATIVE REPORT   PREOPERATIVE DIAGNOSES:  1.  Biliary dyskinesia.  2.  Abdominal pain.   POSTOPERATIVE DIAGNOSES:  1.  Biliary dyskinesia.  2.  Abdominal pain.   PROCEDURE:  Laparoscopic cholecystectomy with intraoperative  cholangiography.   SURGEON:  Velora Heckler, MD, FACS   ASSISTANT:  Avel Peace, MD, FACS   ANESTHESIA:  General.   ESTIMATED BLOOD LOSS:  Minimal.   PREPARATION:  Betadine.   COMPLICATIONS:  None.   INDICATIONS:  The patient is a 75 year old white female from [place],  [place].  She is referred by Dr. Laurann [name] for abdominal pain  radiating to the back.  The patient has had previous hiatal hernia repair.  The patient has developed intermittent epigastric and right upper quadrant  abdominal pain.  This is exacerbated by fatty food intake.  Nuclear Medicine  hepatobiliary scanning was obtained which showed a diminished gallbladder  ejection fraction of 19%.  The patient now comes to surgery for  cholecystectomy.   BODY OF REPORT:  Procedure is done in OR #1 at the [facility].  The patient is brought to the operating room and placed in a  supine position on the operating room table.  Following the administration  of general anesthesia, the patient is prepped and draped in usual strict  aseptic fashion.  After ascertaining that an adequate level of anesthesia  had been obtained, an infraumbilical incision is made the midline with a #15  blade.  Dissection is carried down through subcutaneous tissues.  Fascia is  incised in the midline and the peritoneal cavity is entered cautiously.  A  0  Vicryl pursestring suture is placed in the fascia.  An Hasson cannula is  introduced and secured with a pursestring suture.  The abdomen was  insufflated with carbon dioxide.  Laparoscope is introduced and the abdomen  explored.  Operative ports are placed along the right costal margin in the  midline, midclavicular line and anterior axillary line.  Fundus of the  gallbladder is grasped and retracted cephalad.  Dissection is begun at the  neck of the gallbladder.  Cystic duct is dissected out along its length.  Clip is placed at the neck of the gallbladder.  Cystic duct is incised.  Mucoid yellow bile emanates from the cystic duct.  A Cook cholangiography  catheter is introduced through a stab wound in the right upper quadrant and  inserted into the cystic duct.  It is secured with a Ligaclip.  Using C-arm  fluoroscopy, real-time cholangiography is performed.  There is rapid filling  of a normal-caliber common bile duct.  There is free flow distally into the  duodenum without filling defect  or obstruction.  There is reflux of contrast  into the right and left hepatic ductal systems.  Clip is withdrawn and Cook  catheter is removed from the peritoneal cavity.  Cystic duct is triply  clipped and divided.  The cystic artery is dissected out, doubly clipped,  and divided.  Posterior branch of the artery is doubly clipped and divided.  Gallbladder is excised from the gallbladder bed using the hook  electrocautery for hemostasis.  The entire gallbladder is extracted through  the umbilical port without difficulty.  On palpation, it does not appear to  contain gallstones.  The right upper quadrant is irrigated with warm saline,  which is evacuated.  Good hemostasis is noted.  Pneumoperitoneum is released  and ports are removed under direct vision.  Good hemostasis is noted at all  port sites.  Pneumoperitoneum is completely evacuated.  The 0 Vicryl  pursestring suture is tied securely at the  umbilicus.  All port sites were  anesthetized with local anesthetic.  All wounds were closed with interrupted  4-0 Vicryl subcuticular sutures.  Wounds washed and dried and Benzoin and  Steri-Strips are applied.  Sterile dressings were applied.  The patient is  awakened from anesthesia and brought to the recovery room in stable  condition.  The patient tolerated the procedure well.      Velora Heckler, MD  Electronically Signed     TMG/MEDQ  D:  04/14/2005  T:  04/15/2005  Job:  [id number]   cc:   Arturo Morton. Riley Kill, M.D. [facility]  [address]  [address]  [place]  [place] [address]   Stacie Acres. Cliffton Asters, M.D.  Fax: [phone]

## 2010-08-02 NOTE — Cardiovascular Report (Signed)
[name], KNOFF NO.:  [id number]   MEDICAL RECORD NO.:  [id number]          PATIENT TYPE:  OIB   LOCATION:  2899                         FACILITY:  [facility]   PHYSICIAN:  Rollene Rotunda, M.D.   DATE OF BIRTH:  [birth year]   DATE OF PROCEDURE:  01/07/2005  DATE OF DISCHARGE:                              CARDIAC CATHETERIZATION   PRIMARY CARE PHYSICIAN:  Stacie Acres. Cliffton Asters, M.D.   CARDIOLOGIST:  Arturo Morton. Riley Kill, M.D.   PROCEDURE:  Left heart catheterization/coronary arteriography.   INDICATIONS FOR PROCEDURE:  Evaluate patient with chest pain.  She had a  negative stress perfusion study without any evidence of ischemia.  However,  a cardiac CT suggested non-obstructive coronary disease with possible 50 to  70% PDA lesion as the most significant blockage.   DESCRIPTION OF PROCEDURE:  Left heart catheterization performed via the  right femoral artery.  The artery was cannulated using anterior puncture.  A  #6 French arterial sheath was inserted via the modified Seldinger technique.  Pre-formed Judkins and a pigtail catheter were utilized.  The patient  tolerated the procedure well, and left the lab in stable condition.   RESULTS:  1.  Hemodynamics:  LV 179/23, AL 177/91.  2.  Coronaries:  The left main was normal.  The LAD had proximal 25 to 30%      stenosis in a calcified segment.  This was after a small first diagonal.      The first diagonal had ostial 30 to 40% stenosis.  The second diagonal      was moderate size to large and normal.  The circumflex in the AV groove      essentially terminated as a large obtuse marginal.  There were diffuse      25 to 35% lesions throughout the proximal segment.  The mid obtuse      marginal itself had minor luminal irregularities.  The right coronary      artery was a large dominant vessel.  There was long proximal 25%      stenosis.  Mid calcification with diffuse luminal irregularities, long      25% stenosis before  the PDA.  The ostial PDA had a 70% lesion.   LEFT VENTRICULOGRAM:  The left ventriculogram was obtained in the RAO  projection.  The EF was 65% with normal wall motion.   CONCLUSION:  Coronary artery disease as described.  Well-preserved ejection  fraction.   PLAN:  I have reviewed these films with Dr. Samule Ohm.  At this point, the  patient has no objective evidence of ischemia and had no ischemia in the  distribution of the PDA.  She is having multiple issues with her gallbladder  and is having consideration of having this resected.  It might be prudent to  proceed with this management with  aggressive beta blockade.  Then consideration could be given if she had  continued chest pain symptoms to elective revascularization of the PDA.  However, we will review this with Dr. Riley Kill when he is back in town and we  will have the  patient see him later this month on Halloween day.           ______________________________  Rollene Rotunda, M.D.     JH/MEDQ  D:  01/07/2005  T:  01/07/2005  Job:  [id number]   cc:   Arturo Morton. Riley Kill, M.D. [facility]  [address]  [address]  [place]  [place] [address]   Stacie Acres. Cliffton Asters, M.D.  Fax: [phone]

## 2010-08-02 NOTE — Op Note (Signed)
   NAME:  [name], MUHLENKAMP                           ACCOUNT NO.:  [id number]   MEDICAL RECORD NO.:  [id number]                   PATIENT TYPE:  AMB   LOCATION:  ENDO                                 FACILITY:  [facility]   PHYSICIAN:  John C. Madilyn Fireman, M.D.                 DATE OF BIRTH:  11/13/1935   DATE OF PROCEDURE:  01/06/2003  DATE OF DISCHARGE:                                 OPERATIVE REPORT   PROCEDURE PERFORMED:  Esophagogastroduodenoscopy.   ENDOSCOPIST:  Barrie Folk, M.D.   INDICATIONS FOR PROCEDURE:  Poorly controlled reflux symptoms in a patient,  also undergoing colonoscopy for rectal bleeding and heme positive stools.   DESCRIPTION OF PROCEDURE:  The patient was placed in the left lateral  decubitus position and placed on the pulse monitor with continuous low-flow  oxygen delivered by nasal cannula.  She was sedated with 50 mcg IV fentanyl  and 6 mg IV Versed.  The Olympus video endoscope was advanced under direct  vision in the oropharynx and esophagus.  The esophagus was straight and of  normal caliber with the squamocolumnar line at 38 cm.  The level of the  hiatus appeared to be about 2 cm distal to the squamocolumnar line and there  was a side compartment of gastric lumen between the squamocolumnar line and  the diaphragmatic hiatus appreciated both on direct view and retroflex view  consistent with a paraesophageal hernia.  There was no ring, stricture or  any mucosal abnormalities associated with the hernia.  The stomach was  entered and a small amount of liquid secretions was suctioned from the  fundus.  A retroflex view of the cardia again suggested a paraesophageal  hernia as mentioned and was otherwise unremarkable.  The fundus, body,  antrum and pylorus all appeared normal.  The duodenum was entered and both  the bulb and second portion were well inspected and appeared to be within  normal limits.  The scope was then withdrawn and the patient was prepared  for  colonoscopy. The patient tolerated the procedure well.  There were no  immediate complications.   IMPRESSION:  Hiatal hernia possibly paraesophageal.   PLAN:  1. Will obtain barium swallow to confirm or rule out paraesophageal hernia.  2. Will proceed to colonoscopy.                                               John C. Madilyn Fireman, M.D.    JCH/MEDQ  D:  01/06/2003  T:  01/06/2003  Job:  [id number]   cc:   Stacie Acres. White, M.D.  [address]  [place]  [place] [address]  Fax: [phone]

## 2010-08-02 NOTE — Assessment & Plan Note (Signed)
[facility]                            CARDIOLOGY OFFICE NOTE   ARIELY, RIDDELL                        MRN:          [id number]  DATE:04/17/2006                            DOB:          March 25, 1935    [name].  She thinks her rosacea was worse recently.  She was under quite a bit of stress and was sick.  She had a sinus  infection and was taking antibiotics for most of December.  She  subsequently was treated with medication.  All of this subsequently  improved.  She has been more anxious since coming off of the atenolol  and she thinks the atenolol does help her.  She also asks about her  arthritis medicine today.   Today, the blood pressure is 129/86, the pulse is 86, the lung fields  are clear and the cardiac rhythm is regular.   IMPRESSION:  1. Coronary artery disease, on multi-drug therapy.  2. History of Statin intolerance.  3. History of Raynaud's.   PLAN:  1. Return to clinic in 3-4 months.  2. Continue current medical regimen, although we have suggested that      she could take 6.25 mg of atenolol daily.  3. I have encouraged her to talk with Dr. Coral Spikes about her arthritis      medication.     Arturo Morton. Riley Kill, MD, [facility]  Electronically Signed    TDS/MedQ  DD: 04/17/2006  DT: 04/17/2006  Job #: [id number]

## 2010-08-02 NOTE — Assessment & Plan Note (Signed)
[facility]                              CARDIOLOGY OFFICE NOTE   [name], [name]                        MRN:          [id number]  DATE:12/11/2005                            DOB:          1935/12/05    [name] is in for follow up. She has not been having any significant chest  pain. She has been sleeping a lot. She says that she goes through periods  where she gets extreme fatigue for about three weeks. She also notes that  she started taking Aleve at about the time her blood pressure went up. Her  blood pressures over the past few weeks on minimal medicines have been well  controlled.   Today, the blood pressure is 128/80. The pulse is 56. The lung fields are  clear.   Her sheet documents her blood pressures which as I mentioned are reasonably  well controlled. The patient is scheduled to see Dr. Coral Spikes back soon.  Whether she has Raynaud's and whether or not they add a calcium-channel  antagonist, we will leave to him. Her blood pressures at the present time  seem well controlled. We talked about the potential that Aleve might  precipitate some of this. With regard to her hyperlipidemia, she has not  been able to tolerate almost any of the medications, and that has been an  ongoing issue as well. I plan to see her back in followup in about two  months.       [name]. Riley Kill, MD, [facility]     TDS/MedQ  DD:  12/11/2005  DT:  12/13/2005  Job #:  [id number]

## 2010-08-02 NOTE — Op Note (Signed)
[name], [name] NO.:  [id number]   MEDICAL RECORD NO.:  [id number]          PATIENT TYPE:  INP   LOCATION:  1432                         [facility]AC[facility]   PHYSICIAN:  Luis Abed, MD, FACCDATE OF BIRTH:  05-24-1935   DATE OF PROCEDURE:  DATE OF DISCHARGE:                                 OPERATIVE REPORT   [name] is a pleasant 75 year old female with known mild to moderate  coronary disease.  She was admitted to [facility] on  December 13, 2005.  See my dictated H&P.   Heparin had been ordered in the hospital and it was used.  Otherwise, her  home medications were continued.  The patient had no further chest pain.  Her monitor showed no ectopy.  Repeat EKG showed no change.  Troponins were  completely normal.   Patient was monitored and she was stable.  Her hemoglobin on admission was  11.8.  Follow-up was 10.3.  There was no evidence of significant bleeding.  BUN was 15 with a creatinine of 1.1 and her potassium was 4.4.  CPKs were 49  and 68 and troponins were normal at point of care and 0.03, 0.03.   HOSPITAL COURSE:  The patient was stable and with all her studies negative  she is now ready for discharge.   DISCHARGE MEDICATIONS:  Same as her home medicine including Citracal,  glucosamine, aspirin, Atenolol, Pepcid over-the-counter.   FINAL DIAGNOSES:  1. Intolerant to statins.  2. Status post cholecystectomy.  3. History of Raynaud's.  4. Coronary disease with a catheterization in October 2006.  The most      significant lesion was a PDA lesion of 70%.  5. Admission with chest pain.  Because of the coronary disease history,      the patient was admitted to rule out      myocardial infarction.  Enzymes are completely normal.  Therefore, the      patient is stable for discharged home and I will talk to Dr. Riley Kill      about follow-up.  We will contact patient with the follow-up.  Her      nitroglycerin was old and  she will be given a nitroglycerin      prescription.           ______________________________  Luis Abed, MD, [facility]     JDK/MEDQ  D:  12/14/2005  T:  12/15/2005  Job:  [id number]   cc:   Stacie Acres. Cliffton Asters, M.D.  Fax: [phone]   Arturo Morton. Riley Kill, MD, [facility]  [address]  [address]  [place]  [place] [address]

## 2010-08-02 NOTE — Assessment & Plan Note (Signed)
[facility]                              CARDIOLOGY OFFICE NOTE   STEPHANIE, LITTMAN                        MRN:          [id number]  DATE:11/20/2005                            DOB:          1935-05-26    [name] is in for a follow up visit.  In general, she has not had significant  recurrence of her back discomfort.  However, she says she has been sleeping  quite a bit.  She has been sleeping more than 10 hours a day.  We have  recently cut down her atenolol with a question of whether this could be  related actually to the atenolol use and replace this with an ARB.  Importantly, she and I again reviewed her hypercholesterolemia medicine.  She is taking multiple medicines including most of the statins.  Cholesterol  lowering drugs have been tried as well.  She even tried red yeast rice which  she said made her feel even worse.  She has not been having a lot of chest  pain per se.   On her examination today the patient blood pressure is 127/70, the pulse is  55, the lung fields are clear and the cardiac rhythm is regular.   I reviewed her medicine list with her today in detail.  She took her Atacand  for 1 day, and actually felt worse but has resumed it, and seems to be  tolerating it reasonably well.  When we talked today, despite our recent  discussions it did not seem as though she really understood the reasons for  the change in medication.  She seems to be tolerating the Atacand reasonably  well and I think we will continue her on 12.5 mg of atenolol daily.  I am  going to go ahead and get a basic metabolic profile today.  Her blood  pressure is under reasonable control.  I am still concerned about her  hypercholesterolemia, but it is becoming more difficult to treat at the  present time.  I am not convinced that her symptoms are cardiac and there is  not physical evidence to suggest another etiology at the present time, but  we will continue to  monitor her closely and I will see her back in follow up  within a couple of weeks to ensure that she remains stable.                              Arturo Morton. Riley Kill, MD, [facility]    TDS/MedQ  DD:  11/21/2005  DT:  11/21/2005  Job #:  [id number]

## 2010-08-02 NOTE — H&P (Signed)
NAMEADEJA, SARRATT NO.:  [id number]   MEDICAL RECORD NO.:  [id number]          PATIENT TYPE:  INP   LOCATION:  0103                         FACILITY:  [facility]   PHYSICIAN:  Luis Abed, MD, FACCDATE OF BIRTH:  [birth year]   DATE OF ADMISSION:  12/13/2005  DATE OF DISCHARGE:                                HISTORY & PHYSICAL   Ms. Tegethoff is a delightful 75 year old female.  She has mild to moderate  coronary disease by history.  Catheterization done in October of 2006  revealed LAD disease at 25%.  There was a 40% diagonal lesion, 25%  circumflex lesion, a 70% stenosis of the PDA.  A Cardiolite had been done  prior to that, and it is my understanding there was no ischemia in the  distribution of the PDA.  The patient has been followed medically.  She has  done well.  Unfortunately, she does not tolerate Statins.  It is clear in  the records that Dr. Riley Kill has very carefully tried to use these, and the  patient has not tolerated them.   Today, the patient ate and then took some medications.  She was careful in  her eating.  However, not long after that, she had approximately 30 minutes  of chest discomfort.  It was fairly severe.  She had no nausea, vomiting or  diaphoresis.  There was no radiation.  The pain resolved on its own.  She  does have nitroglycerin, although it is quite old.  She came to [facility]  emergency room.  She has been stable here.  Her first point of care troponin  is normal.  EKG reveals no diagnostic abnormalities.  I am here to see her  and fully assess her.   PAST MEDICAL HISTORY:  ALLERGIES:  NO KNOWN DRUG ALLERGIES.   MEDICATIONS:  1. Prilosec OTC.  2. Glucosamine.  3. Citrucel fiber laxative.  4. Aspirin 81.  5. Atenolol 25.  6. Ambien, keep at night on a p.r.n. basis.  7. Doxycycline 100 mg daily.   OTHER MEDICAL PROBLEMS:  See the complete list below.   SOCIAL HISTORY:  Patient does not smoke or drink.  She lives in  [place]  with he husband.   FAMILY HISTORY:  The family history is not contributory at this time.   REVIEW OF SYSTEMS:  The patient has been having no significant problems.  She has a history of Raynaud's.  She has not been bothered by this  excessively.  Other than today after eating, she has not had any major GI  symptoms.  Otherwise, her review of systems is negative.   PHYSICAL EXAMINATION:  VITAL SIGNS:  Her blood pressure initially when she  took it, when she had the pain at home was high.  Blood pressure here is  142/78.  Her pulse is 63.  Respirations are 17.  Temperature is 97.3.  GENERAL:  She appears quite stable.  She has normal body habitus and no  deformities.  HEENT:  Her eye exam reveals no xanthelasma.  She has normal dentition.  NECK:  Reveals no jugular venous distention.  LUNGS:  Clear.  Respiratory effort is not labored.  CARDIAC:  Reveals an S1 with an S2.  There are no clicks or significant  murmurs.  ABDOMEN:  Soft.  There are no masses or bruits.  EXTREMITIES:  Her distal pulses are 2+ bilaterally.  She has no significant  peripheral edema.  BACK:  Reveals no significant kyphosis or scoliosis.  NEUROLOGIC:  She is oriented to person, time and place.  Her affect is  normal, and she is very pleasant.   LABORATORY STUDIES:  So far, reveal a hemoglobin of 11.8.  BUN is 15 with a  creatinine of 1.1 and potassium of 4.4.  Her point of care troponin is less  than 0.05.  Chest film reveals that there is no edema.  There is no active  cardiopulmonary disease present.  EKG revealed an RSR prime in V1.  There is  no acute abnormality.   PROBLEM:  1. History of cholecystectomy.  2. Some type of paraesophageal surgery in the past.  3. Intolerance to Statins.  4. Raynaud's phenomenon that mild.  5. Coronary artery disease, post cath with the data as described above.      Most significant lesion is a PDA lesion of 70% in October of 2006.  6. *Presentation today  with 30 minutes of chest discomfort.  It is      possible that this GI in origin.  However, we must observe her tonight      to be sure that she is stable.  I do not feel that she needs Heparin or      the addition of other significant medications, as she is quiet stable.      If her enzymes are normal and she is stable, I will allow her to go      home tomorrow.  Otherwise, more aggressive workup will be indicated.           ______________________________  Luis Abed, MD, [facility]     JDK/MEDQ  D:  12/13/2005  T:  12/13/2005  Job:  [id number]   cc:   Arturo Morton. Riley Kill, MD, [facility]  [address]  [address]  [place]  [place] [address]

## 2010-08-02 NOTE — Op Note (Signed)
NAME:  [name], [name]                           ACCOUNT NO.:  [id number]   MEDICAL RECORD NO.:  [id number]                   PATIENT TYPE:  AMB   LOCATION:  ENDO                                 FACILITY:  [facility]   PHYSICIAN:  Iva Boop, M.D. [facility]           DATE OF BIRTH:  26-Jul-1935   DATE OF PROCEDURE:  11/07/2003  DATE OF DISCHARGE:  11/07/2003                                 OPERATIVE REPORT   PROCEDURE:  Esophageal manometry.   ENDOSCOPIST:  Iva Boop, M.D.   INDICATIONS:  Preoperative study.  The patient has a paraesophageal hernia.  It was requested by Dr. Darnell Level and Dr. Wenda Low.   DESCRIPTION OF PROCEDURE:  This is a limited study given the patient  paraesophageal hernia, lower esophageal sphincter could not really be  assessed.  It was a technically difficult procedure and fluoroscopy was  utilized by the endoscopy nurse to estimate the lower esophageal sphincter  at 39-40 cm.  There were frequent double swallows with all wet swallows,  essentially have a dry swallow after a wet swallow.   IMPRESSION/FINDINGS:  Review of the data indicates that she has adequate  peristalsis with good amplitude, contractions in the upper and mid and lower  esophagus.  I think based upon this there is no obvious contraindication to  surgery which is indicated anyway.  Upper esophageal sphincter seems to  function normally.                                               Iva Boop, M.D. [facility]    CEG/MEDQ  D:  11/14/2003  T:  11/15/2003  Job:  [id number]   cc:   [place] Daphine Deutscher, M.D.  [address]  [place]  [place] [address]  Fax: [phone]

## 2010-08-02 NOTE — Assessment & Plan Note (Signed)
[facility]                            CARDIOLOGY OFFICE NOTE   TAUNA, MACFARLANE                        MRN:          [id number]  DATE:02/13/2006                            DOB:          [birth year]    [name] is in for a followup visit.  She is stable.  She has not been  having any ongoing major problems, but she has noticed for a while she  has had a facial and neck rash.  She thought that it might be from  atenolol, so she went ahead and stopped it.  The rash went away, and  then when she restarted it, it came back again.  She would like to try  some Norvasc or something other than a beta blocker at this present  time.  Her blood pressure has been under reasonable control.   She does have, on occasion, some episodes of anginal-type chest pain,  but it is sporadic and not reproducible.   Blood pressure today is 124/82 with a pulse of 69.   Her EKG reveals sinus rhythm, and was within normal limits.   She is going to go ahead and try some amlodipine at 2.5 mg daily.  She  will stop her atenolol.  I will see her back in followup in about 4 to 5  weeks and reassess her status.     Arturo Morton. Riley Kill, MD, [facility]  Electronically Signed    TDS/MedQ  DD: 02/13/2006  DT: 02/14/2006  Job #: [id number]

## 2010-08-02 NOTE — Assessment & Plan Note (Signed)
[facility]                              CARDIOLOGY OFFICE NOTE   LERAE, LANGHAM                        MRN:          [id number]  DATE:11/12/2005                            DOB:          October 13, 1935    [name] comes in today to be seen.  She has been feeling fairly tired,  fatigued.  She has also had some left shoulder blade pain that comes and  goes.  It does not apparently have any clear-cut relationship to exertion.  Motion does not necessarily make it worse either, but it occurs right in the  middle of the left shoulder blade.  [name] underwent cardiac  catheterization in October of 2006.  At that time the left main was normal,  and the LAD had a proximal 25% to 30% stenosis and a calcified segment.  The  first diagonal had a 30% to 40% stenosis.  The circumflex and the A-V groove  terminated as a large marginal branch, with diffuse 25% to 35% lesions in  the proximal segment.  The right coronary was a large dominant vessel with a  25% stenosis.  Ostial PDA had a 70% lesion, and the overall left ventricular  function was normal, with an ejection fraction estimated in the range of  65%.  The patient has had hypercholesterolemia, but has been on multiple  medical therapies, and been virtually unable to tolerate almost any of them.   PHYSICAL EXAMINATION:  Today on her examination the weight is 181 pounds,  the blood pressure is 169/85, and the pulse is 58.  LUNGS:  The lung fields are quite clear.  CARDIAC:  The PMI is nondisplaced.  There is a normal first and second heart  sound.  Pulses are equal bilaterally without reduction.  EXTREMITIES:  Reveal no edema.   The electrocardiogram demonstrates normal sinus rhythm/sinus bradycardia  with no ischemia changes.   Some of the fatigue potentially could be related to the beta blockade.  We  will go ahead and cut down her atenolol to 12.5 mg daily.  She previously  had some symptoms with this  as well.  I am also going to go ahead and start  her on candesartan 8 mg daily.  We will have her return to the clinic in one  week for careful followup.  I am concerned about the left shoulder  discomfort, but my suspicion is that it is likely musculoskeletal.  Should  this progress, I have encouraged her to go to the Emergency Room, and she  potentially would require a CT scan to make sure she does not have a  dissection, although that does not appear likely at the present time.                              Arturo Morton. Riley Kill, MD, [facility]    TDS/MedQ  DD:  11/15/2005  DT:  11/16/2005  Job #:  [id number]

## 2010-08-02 NOTE — Letter (Signed)
Jul 21, 2006    Demetria Pore. Coral Spikes, M.D.  [address]  [address]  [place], [place] [address]   RE:  [name], [name]  MRN:  [id number]  /  DOB:  30-Dec-1935   Dear Theron Arista:   I had the pleasure of seeing [name] in the office today in  followup. She says that she has been fatigued a lot. She says that she  can barely go. She wonders whether this could be from the arthritis,  and/or something else that might be wrong. She denies any loss of blood  in her stool. She has been sleeping up to 12 hours a day. She has  actually stopped going to church, and I attend the same church, she says  the stairs are too steep to get up. She is also depressed about friends  who have gone to [place]. She says she hopes she is not trying to be  impossible and wonders whether she should go on slow niacin for her  cholesterol.   Her medications include Citrucel daily, glucosamine daily, aspirin 81 mg  daily, Prilosec 20 mg a day, amlodipine 2.5 mg daily and atenolol 6.25  mg daily.   I might note also that she gets some discomfort in the back. She also  notes that she gets short of breath when lying down at night, and has to  sit up in the chair at times.   In the fall of this year, she did have a chest x-ray that was  unremarkable.   Today, on examination, the weight was 188 pounds. Blood pressure was  106/74, pulse was 65.  The lung fields were entirely clear.  The cardiac rhythm was regular. There was no significant murmur, rub or  gallop.  The carotid upstrokes were brisk bilaterally and there was no  differentiation in the two upstrokes.   Her EKG reveals normal sinus rhythm essentially within normal limits.   We are going to go ahead and get a BNP level. We are also going to get a  CBC as she has been borderline anemic in the past. We will also get a 2D  echocardiogram to assess her left ventricular function in light of her  symptoms. We talked about the possibility of a cardiac  catheterization  in light of her previously known coronary disease, but she says that she  is not ready for that and would prefer to followup at about a month.   We will call her with the results of these and otherwise I will see her  back in a month and I have encouraged her to stay in the water and stay  active. I will communicate with you when I see her back. Should she have  any increasing symptoms, she knows to call us promptly.    Sincerely,      Arturo Morton. Riley Kill, MD, [facility]  Electronically Signed    TDS/MedQ  DD: 07/21/2006  DT: 07/21/2006  Job #: [id number]

## 2010-12-06 ENCOUNTER — Encounter: Payer: Self-pay | Admitting: *Deleted

## 2010-12-06 ENCOUNTER — Encounter: Payer: Self-pay | Admitting: Cardiology

## 2010-12-09 ENCOUNTER — Ambulatory Visit (INDEPENDENT_AMBULATORY_CARE_PROVIDER_SITE_OTHER): Payer: Medicare Other | Admitting: Cardiology

## 2010-12-09 ENCOUNTER — Encounter: Payer: Self-pay | Admitting: Cardiology

## 2010-12-09 VITALS — BP 100/70 | HR 66 | Resp 18 | Ht 64.0 in | Wt 146.8 lb

## 2010-12-09 DIAGNOSIS — K31819 Angiodysplasia of stomach and duodenum without bleeding: Secondary | ICD-10-CM

## 2010-12-09 DIAGNOSIS — E785 Hyperlipidemia, unspecified: Secondary | ICD-10-CM

## 2010-12-09 DIAGNOSIS — I2510 Atherosclerotic heart disease of native coronary artery without angina pectoris: Secondary | ICD-10-CM

## 2010-12-09 NOTE — Progress Notes (Signed)
HPI:  She is doing some better.  No chest pain.  Feeling pretty good.  She thinks her Sjogren's is what causes her most of her fatigue.  Denies progressive symptoms.  She has not had further GI issues.  Does not tolerate statins.   Current Outpatient Prescriptions  Medication Sig Dispense Refill  . amLODipine (NORVASC) 5 MG tablet Take 5 mg by mouth daily.        . Cholecalciferol (VITAMIN D3) 1000 UNITS CAPS Take 1 tablet by mouth daily.        . Multiple Vitamin (MULTIVITAMIN) capsule Take 1 capsule by mouth daily.        [name] omeprazole (PRILOSEC) 20 MG capsule Take 20 mg by mouth daily.          Allergies  Allergen Reactions  . Ezetimibe   . Ezetimibe-Simvastatin   . Morphine   . Rosuvastatin     Past Medical History  Diagnosis Date  . DYSLIPIDEMIA   . IRON DEFICIENCY ANEMIA SECONDARY TO BLOOD LOSS   . CORONARY ARTERY DISEASE   . Right bundle branch block   . SUPRAVENTRICULAR TACHYCARDIA   . Raynaud's syndrome   . EXTERNAL HEMORRHOIDS   . GERD   . GASTRIC ANTRAL VASCULAR ECTASIA   . ENDOMETRIOSIS   . ACNE ROSACEA   . SJOGREN'S SYNDROME   . OSTEOARTHRITIS   . SKIN RASH   . COLONIC POLYPS, ADENOMATOUS, HX OF   . HEADACHE, CHRONIC, HX OF     Past Surgical History  Procedure Date  . Cholecystectomy   . Oophorectomy   . Breast lumpectomy   . Tear duct probing   . Hernia repair     Family History  Problem Relation Age of Onset  . Heart failure    . Breast cancer      History   Social History  . Marital Status: Married    Spouse Name: N/A    Number of Children: N/A  . Years of Education: N/A   Occupational History  . Not on file.   Social History Main Topics  . Smoking status: Never Smoker   . Smokeless tobacco: Not on file  . Alcohol Use: No  . Drug Use: No  . Sexually Active: Not on file   Other Topics Concern  . Not on file   Social History Narrative  . No narrative on file    ROS: Please see the HPI.  All other systems reviewed and  negative.  PHYSICAL EXAM:  BP 100/70  Pulse 66  Resp 18  Ht 5\' 4"  (1.626 m)  Wt 146 lb 12.8 oz (66.588 kg)  BMI 25.20 kg/m2  General: Well developed, well nourished, in no acute distress. Head:  Normocephalic and atraumatic. Neck: no JVD Lungs: Clear to auscultation and percussion. Heart: Normal S1 and S2.  No murmur, rubs or gallops.  Pulses: Pulses normal in all 4 extremities. Extremities: No clubbing or cyanosis. No edema. Neurologic: Alert and oriented x 3.  EKG:  NSR. WNL. ASSESSMENT AND PLAN:

## 2010-12-09 NOTE — Assessment & Plan Note (Signed)
Does not tolerate statins, and says clearly that she will not take nor consider them

## 2010-12-09 NOTE — Assessment & Plan Note (Signed)
Has been followed by Dr. Leone Payor.

## 2010-12-09 NOTE — Assessment & Plan Note (Signed)
No current symptoms.  Rare to occasional chest pain.  Continue medical therapy. Not on ASA due to GI bleeding secondary to GAVE

## 2010-12-09 NOTE — Patient Instructions (Signed)
Your physician wants you to follow-up in: 6 MONTHS.  You will receive a reminder letter in the mail two months in advance. If you don't receive a letter, please call our office to schedule the follow-up appointment.  Your physician recommends that you continue on your current medications as directed. Please refer to the Current Medication list given to you today.  

## 2011-01-17 ENCOUNTER — Other Ambulatory Visit: Payer: Self-pay | Admitting: Internal Medicine

## 2011-01-17 DIAGNOSIS — E042 Nontoxic multinodular goiter: Secondary | ICD-10-CM

## 2011-01-23 ENCOUNTER — Other Ambulatory Visit: Payer: Medicare Other

## 2011-01-28 ENCOUNTER — Ambulatory Visit
Admission: RE | Admit: 2011-01-28 | Discharge: 2011-01-28 | Disposition: A | Discharge status: Home / Self Care | Payer: Medicare Other | Source: Ambulatory Visit | Attending: Internal Medicine | Admitting: Internal Medicine

## 2011-01-28 DIAGNOSIS — E042 Nontoxic multinodular goiter: Secondary | ICD-10-CM

## 2011-01-29 ENCOUNTER — Other Ambulatory Visit: Payer: Medicare Other

## 2011-03-04 ENCOUNTER — Telehealth: Payer: Self-pay | Admitting: Cardiology

## 2011-03-04 NOTE — Telephone Encounter (Signed)
Patient was called,stated her heart has been going in and out of rhythm for appox 2 weeks.States worse at night.No chest pain,no sob,wants to know if she needs appointment with Dr. Riley Kill.

## 2011-03-04 NOTE — Telephone Encounter (Signed)
Pt is having issues with heart being out of rhythm and it comes and goes and more so at night to the point she has to cough sometimes to get any relief and wants to be seen this week

## 2011-03-05 ENCOUNTER — Ambulatory Visit (INDEPENDENT_AMBULATORY_CARE_PROVIDER_SITE_OTHER): Payer: Medicare Other | Admitting: Cardiology

## 2011-03-05 DIAGNOSIS — E785 Hyperlipidemia, unspecified: Secondary | ICD-10-CM

## 2011-03-05 DIAGNOSIS — I2510 Atherosclerotic heart disease of native coronary artery without angina pectoris: Secondary | ICD-10-CM

## 2011-03-05 DIAGNOSIS — R002 Palpitations: Secondary | ICD-10-CM

## 2011-03-05 MED ORDER — NITROGLYCERIN 0.4 MG SL SUBL: 0.4000 mg | SUBLINGUAL_TABLET | SUBLINGUAL | Status: DC | PRN

## 2011-03-05 NOTE — Telephone Encounter (Signed)
Per Dr Riley Kill schedule this pt to be seen this week. I spoke with the pt and scheduled her to see Dr Riley Kill today at 3:30.

## 2011-03-05 NOTE — Patient Instructions (Signed)
Your physician recommends that you have lab work today: CBC  Your physician recommends that you schedule a follow-up appointment in: 6 WEEKS  Your physician recommends that you continue on your current medications as directed. Please refer to the Current Medication list given to you today.

## 2011-03-06 DIAGNOSIS — R002 Palpitations: Secondary | ICD-10-CM | POA: Insufficient documentation

## 2011-03-06 HISTORY — DX: Palpitations: R00.2

## 2011-03-06 LAB — CBC WITH DIFFERENTIAL/PLATELET
Basophils Relative: 0.9 % (ref 0.0–3.0)
Eosinophils Relative: 1.2 % (ref 0.0–5.0)
HCT: 35.1 % — ABNORMAL LOW (ref 36.0–46.0)
Hemoglobin: 11.3 g/dL — ABNORMAL LOW (ref 12.0–15.0)
Lymphs Abs: 1.2 10*3/uL (ref 0.7–4.0)
Monocytes Relative: 9.2 % (ref 3.0–12.0)
Neutro Abs: 3.6 10*3/uL (ref 1.4–7.7)
RBC: 4.26 Mil/uL (ref 3.87–5.11)
WBC: 5.4 10*3/uL (ref 4.5–10.5)

## 2011-03-06 NOTE — Assessment & Plan Note (Signed)
From her description, this could be an atrial arrythmia, such as Afib, but on exam she has some palpitations, and some what sound like ectopic beats, but not strung together.  An event monitor would be of help to identify any problems, and she is agreeable to this.  We will arrange.

## 2011-03-06 NOTE — Assessment & Plan Note (Signed)
She has been stable from a coronary standpoint.  No chest pain, nor progression of symptoms.

## 2011-03-06 NOTE — Assessment & Plan Note (Signed)
Not able to tolerate statins, therefore, levels have not been checked.  Perhaps on the next visit we will arrange for repeat studies.

## 2011-03-06 NOTE — Progress Notes (Signed)
HPI:  [name] is in for follow up.  She called and wanted to be seen.   She has had some pain in the left leg at night.  It feels like it is in the bone.  She had a massage the other day and it really hurt.  She can walk, but it just seems to throb at night.  She denies back pain, she can walk without difficulty, and there is no discoloration of her feet.  For the past two weeks she has also had a number of irregular heart beats.  It occurs just about every day.  It can go on for about three hours, and then returns to normal.  She denies chest pain, and there has been no exertional dyspnea. She says she is overdue to see Dr. Leone Payor.   Current Outpatient Prescriptions  Medication Sig Dispense Refill  . amLODipine (NORVASC) 5 MG tablet Take 5 mg by mouth daily.        . Cholecalciferol (VITAMIN D3) 1000 UNITS CAPS Take 1 tablet by mouth daily.        . Multiple Vitamin (MULTIVITAMIN) capsule Take 1 capsule by mouth daily.        [name] omeprazole (PRILOSEC) 20 MG capsule Take 20 mg by mouth daily.        . nitroGLYCERIN (NITROSTAT) 0.4 MG SL tablet Place 1 tablet (0.4 mg total) under the tongue every 5 (five) minutes as needed for chest pain.  25 tablet  3    Allergies  Allergen Reactions  . Ezetimibe   . Ezetimibe-Simvastatin   . Morphine   . Rosuvastatin     Past Medical History  Diagnosis Date  . DYSLIPIDEMIA   . IRON DEFICIENCY ANEMIA SECONDARY TO BLOOD LOSS   . CORONARY ARTERY DISEASE   . Right bundle branch block   . SUPRAVENTRICULAR TACHYCARDIA   . Raynaud's syndrome   . EXTERNAL HEMORRHOIDS   . GERD   . GASTRIC ANTRAL VASCULAR ECTASIA   . ENDOMETRIOSIS   . ACNE ROSACEA   . SJOGREN'S SYNDROME   . OSTEOARTHRITIS   . SKIN RASH   . COLONIC POLYPS, ADENOMATOUS, HX OF   . HEADACHE, CHRONIC, HX OF     Past Surgical History  Procedure Date  . Cholecystectomy   . Oophorectomy   . Breast lumpectomy   . Tear duct probing   . Hernia repair     Family History  Problem Relation  Age of Onset  . Heart failure    . Breast cancer      History   Social History  . Marital Status: Married    Spouse Name: N/A    Number of Children: N/A  . Years of Education: N/A   Occupational History  . Not on file.   Social History Main Topics  . Smoking status: Never Smoker   . Smokeless tobacco: Not on file  . Alcohol Use: No  . Drug Use: No  . Sexually Active: Not on file   Other Topics Concern  . Not on file   Social History Narrative  . No narrative on file    ROS: Please see the HPI.  All other systems reviewed and negative.  PHYSICAL EXAM:  BP 128/76  Pulse 75  Ht 5\' 4"  (1.626 m)  Wt 66.225 kg (146 lb)  BMI 25.06 kg/m2  General: Well developed, well nourished, in no acute distress. Head:  Normocephalic and atraumatic. Neck: no JVD Lungs: Clear to auscultation and percussion. Heart:  Normal S1 and S2.  No murmur, rubs or gallops.  Abdomen:  Normal bowel sounds; soft; non tender; no organomegaly Pulses: Pulses normal in all 4 extremities.  There is no discoloration of the feet.   Extremities: No clubbing or cyanosis. No edema. Neurologic: Alert and oriented x 3.  EKG: NSR.  WNL. ASSESSMENT AND PLAN:

## 2011-03-25 ENCOUNTER — Ambulatory Visit
Admission: RE | Admit: 2011-03-25 | Discharge: 2011-03-25 | Disposition: A | Discharge status: Home / Self Care | Payer: Medicare Other | Source: Ambulatory Visit | Attending: Family Medicine | Admitting: Family Medicine

## 2011-03-25 ENCOUNTER — Other Ambulatory Visit: Payer: Self-pay | Admitting: Family Medicine

## 2011-03-25 DIAGNOSIS — M546 Pain in thoracic spine: Secondary | ICD-10-CM

## 2011-03-25 DIAGNOSIS — M79609 Pain in unspecified limb: Secondary | ICD-10-CM

## 2011-03-27 ENCOUNTER — Ambulatory Visit (INDEPENDENT_AMBULATORY_CARE_PROVIDER_SITE_OTHER): Payer: Medicare Other | Admitting: Internal Medicine

## 2011-03-27 ENCOUNTER — Other Ambulatory Visit (INDEPENDENT_AMBULATORY_CARE_PROVIDER_SITE_OTHER): Payer: Medicare Other

## 2011-03-27 ENCOUNTER — Encounter: Payer: Self-pay | Admitting: Internal Medicine

## 2011-03-27 VITALS — BP 100/66 | HR 100 | Ht 64.0 in | Wt 148.8 lb

## 2011-03-27 DIAGNOSIS — D500 Iron deficiency anemia secondary to blood loss (chronic): Secondary | ICD-10-CM

## 2011-03-27 DIAGNOSIS — K31819 Angiodysplasia of stomach and duodenum without bleeding: Secondary | ICD-10-CM

## 2011-03-27 LAB — CBC WITH DIFFERENTIAL/PLATELET
HCT: 34.5 % — ABNORMAL LOW (ref 36.0–46.0)
MCV: 82 fl (ref 78.0–100.0)
RDW: 16.7 % — ABNORMAL HIGH (ref 11.5–14.6)

## 2011-03-27 LAB — FERRITIN: Ferritin: 18.8 ng/mL (ref 10.0–291.0)

## 2011-03-27 NOTE — Patient Instructions (Signed)
Please go to the basement floor before leaving today for the following labwork: CBC, Ferritin Your recall colonoscopy will be due in October 2013. We will send you a letter when it gets closer to that time. Please follow up with Dr Leone Payor as needed.

## 2011-03-27 NOTE — Progress Notes (Signed)
Quick Note:  Let her know Hgb still 11, that is ok, slightly low, iron (ferritin is low) she needs to stay on her iron and needs a CBC in 3 months ______

## 2011-03-27 NOTE — Progress Notes (Signed)
Subjective:    Patient ID: [name], female    DOB: 07-Dec-1935, 76 y.o.   MRN: [id number]  HPI this lady has been seen for gastric antral vascular ectasia and chronic occult blood loss anemia in the past. I have not seen her since last year or more. She recently saw her cardiologist, she was having some palpitations. There was some question of whether or not she might even have atrial fibrillation but fortunately the palpitations that stopped and that does not appear to be the case. The concern there was the possibility of needing anticoagulation therapy in the setting of her gastric antral vascular ectasia. She has not noted any bleeding with melena or rectal bleeding or hematochezia. She had stopped her iron on her own and was told to restart by her cardiologist. Her hemoglobin was 11.3 last month, about 3 weeks ago. She is now back on the ferrous sulfate. She has occasional pale at like stools but otherwise has no major abdominal or gastrointestinal complaints.  Allergies  Allergen Reactions  . Ezetimibe   . Ezetimibe-Simvastatin   . Morphine   . Rosuvastatin    Outpatient Prescriptions Prior to Visit  Medication Sig Dispense Refill  . amLODipine (NORVASC) 5 MG tablet Take 5 mg by mouth daily.        . Cholecalciferol (VITAMIN D3) 1000 UNITS CAPS Take 1 tablet by mouth daily.        . Multiple Vitamin (MULTIVITAMIN) capsule Take 1 capsule by mouth daily.        . nitroGLYCERIN (NITROSTAT) 0.4 MG SL tablet Place 1 tablet (0.4 mg total) under the tongue every 5 (five) minutes as needed for chest pain.  25 tablet  3  . omeprazole (PRILOSEC) 20 MG capsule Take 20 mg by mouth daily.         Past Medical History  Diagnosis Date  . DYSLIPIDEMIA   . IRON DEFICIENCY ANEMIA SECONDARY TO BLOOD LOSS   . CORONARY ARTERY DISEASE   . Right bundle branch block   . SUPRAVENTRICULAR TACHYCARDIA   . Raynaud's syndrome   . EXTERNAL HEMORRHOIDS   . GERD   . GASTRIC ANTRAL VASCULAR ECTASIA   .  ENDOMETRIOSIS   . ACNE ROSACEA   . SJOGREN'S SYNDROME   . OSTEOARTHRITIS   . Rosacea, acne   . COLONIC POLYPS, ADENOMATOUS, HX OF   . HEADACHE, CHRONIC, HX OF    Past Surgical History  Procedure Date  . Cholecystectomy   . Bilateral oophorectomy   . Breast lumpectomy     left  . Hiatal hernia repair 2005    and paraesophageal  . Esophagogastroduodenoscopy 11/27/2009    APC tretment of lesions  . Colonoscopy w/ biopsies and polypectomy 12/17/2006    adenomatous polyps, external hemorrhoids  . Tear duct surgery     right  . Total abdominal hysterectomy     Family History  Problem Relation Age of Onset  . Heart failure Mother   . Breast cancer Sister     x 3 sisters  . Diabetes Mother   . Kidney failure Mother   . Heart disease Father   . Breast cancer Daughter   . Colon cancer Neg Hx           Review of Systems As above    Objective:   Physical Exam General:  NAD Eyes:   anicteric Lungs:  clear Heart:  S1S2 no rubs, murmurs or gallops Abdomen:  soft and nontender, BS+  Data Reviewed:  Today's CBC is returned at the time of dictation as well as the ferritin Lab Results  Component Value Date   WBC 5.5 03/27/2011   HGB 11.2* 03/27/2011   HCT 34.5* 03/27/2011   MCV 82.0 03/27/2011   PLT 217.0 03/27/2011     Lab Results  Component Value Date   FERRITIN 18.8 03/27/2011          Assessment & Plan:

## 2011-03-27 NOTE — Assessment & Plan Note (Addendum)
She stopped her iron for a while, hemoglobin was 11.3, not so bad. She is now back on iron supplementation, and explained that she likely needs to take that for ever. We'll recheck CBC  as well as ferritin today.

## 2011-03-27 NOTE — Assessment & Plan Note (Addendum)
This appears to be adequately treated. At least clinically, she seems okay. Her hemoglobin fell that when she stopped the iron. He reviewed the nature of this and house probably related to her Sjogren's. Since her palpitations have stopped, it does not seem like she is going to need any anticoagulation. I would reserve repeat endoscopy if she require anticoagulation or other other issues such as declining hemoglobin and or evidence of bleeding. She does not have those.

## 2011-03-28 ENCOUNTER — Other Ambulatory Visit: Payer: Self-pay

## 2011-03-28 DIAGNOSIS — D649 Anemia, unspecified: Secondary | ICD-10-CM

## 2011-04-18 ENCOUNTER — Encounter: Payer: Self-pay | Admitting: Cardiology

## 2011-04-18 ENCOUNTER — Ambulatory Visit (INDEPENDENT_AMBULATORY_CARE_PROVIDER_SITE_OTHER): Payer: Medicare Other | Admitting: Cardiology

## 2011-04-18 DIAGNOSIS — E785 Hyperlipidemia, unspecified: Secondary | ICD-10-CM

## 2011-04-18 DIAGNOSIS — K31819 Angiodysplasia of stomach and duodenum without bleeding: Secondary | ICD-10-CM

## 2011-04-18 NOTE — Patient Instructions (Signed)
Your physician wants you to follow-up in: 6 MONTHS.  You will receive a reminder letter in the mail two months in advance. If you don't receive a letter, please call our office to schedule the follow-up appointment.  Your physician recommends that you continue on your current medications as directed. Please refer to the Current Medication list given to you today.  

## 2011-05-09 NOTE — Assessment & Plan Note (Signed)
-  Could not tolerate statins

## 2011-05-09 NOTE — Assessment & Plan Note (Signed)
Being followed by Dr. Leone Payor.  Has chronic iron def anemia, and that would influence any therapy if she were to be found to have something like atrial fib.  Her palpitations have stopped.

## 2011-05-09 NOTE — Progress Notes (Signed)
HPI:  Since I last saw [name] all of her palpitations have gone away.  She is better from that standpoint.  She says she is doing ok, but she continues to hurt a lot.  She is being seen by Dr. Katina Degree replacement for Rheumatology.  No current complaints of chest pain.  She also has seen Dr. Leone Payor.   Current Outpatient Prescriptions  Medication Sig Dispense Refill  . amLODipine (NORVASC) 5 MG tablet Take 5 mg by mouth daily.        . Cholecalciferol (VITAMIN D3) 1000 UNITS CAPS Take 1 tablet by mouth daily.        [name] glucosamine-chondroitin 500-400 MG tablet Take 1 tablet by mouth 3 (three) times daily.      . Multiple Vitamin (MULTIVITAMIN) capsule Take 1 capsule by mouth daily.        . nitroGLYCERIN (NITROSTAT) 0.4 MG SL tablet Place 1 tablet (0.4 mg total) under the tongue every 5 (five) minutes as needed for chest pain.  25 tablet  3  . omeprazole (PRILOSEC) 20 MG capsule Take 20 mg by mouth daily.          Allergies  Allergen Reactions  . Ezetimibe   . Ezetimibe-Simvastatin   . Morphine   . Rosuvastatin     Past Medical History  Diagnosis Date  . DYSLIPIDEMIA   . IRON DEFICIENCY ANEMIA SECONDARY TO BLOOD LOSS   . CORONARY ARTERY DISEASE   . Right bundle branch block   . SUPRAVENTRICULAR TACHYCARDIA   . Raynaud's syndrome   . EXTERNAL HEMORRHOIDS   . GERD   . GASTRIC ANTRAL VASCULAR ECTASIA   . ENDOMETRIOSIS   . ACNE ROSACEA   . SJOGREN'S SYNDROME   . OSTEOARTHRITIS   . Rosacea, acne   . COLONIC POLYPS, ADENOMATOUS, HX OF   . HEADACHE, CHRONIC, HX OF     Past Surgical History  Procedure Date  . Cholecystectomy   . Bilateral oophorectomy   . Breast lumpectomy     left  . Hiatal hernia repair 2005    and paraesophageal  . Esophagogastroduodenoscopy 11/27/2009    APC tretment of lesions  . Colonoscopy w/ biopsies and polypectomy 12/17/2006    adenomatous polyps, external hemorrhoids  . Tear duct surgery     right  . Total abdominal hysterectomy     Family  History  Problem Relation Age of Onset  . Heart failure Mother   . Breast cancer Sister     x 3 sisters  . Diabetes Mother   . Kidney failure Mother   . Heart disease Father   . Breast cancer Daughter   . Colon cancer Neg Hx     History   Social History  . Marital Status: Married    Spouse Name: N/A    Number of Children: N/A  . Years of Education: N/A   Occupational History  . Not on file.   Social History Main Topics  . Smoking status: Never Smoker   . Smokeless tobacco: Never Used  . Alcohol Use: Yes     occasional  . Drug Use: No  . Sexually Active: Not on file   Other Topics Concern  . Not on file   Social History Narrative  . No narrative on file    ROS: Please see the HPI.  All other systems reviewed and negative.  PHYSICAL EXAM:  BP 120/68  Pulse 88  Ht 5' 3.5" (1.613 m)  Wt 150 lb 1.9 oz (68.094  kg)  BMI 26.18 kg/m2  General: Well developed, well nourished, in no acute distress. Head:  Normocephalic and atraumatic. Neck: no JVD Lungs: Clear to auscultation and percussion. Heart: Normal S1 and S2.  No murmur, rubs or gallops.  Abdomen:  Normal bowel sounds; soft; non tender; no organomegaly Pulses: Pulses normal in all 4 extremities. Extremities: No clubbing or cyanosis. No edema. Neurologic: Alert and oriented x 3.  EKG:  ASSESSMENT AND PLAN:

## 2011-06-30 ENCOUNTER — Other Ambulatory Visit: Payer: Self-pay | Admitting: Family Medicine

## 2011-06-30 DIAGNOSIS — Z1231 Encounter for screening mammogram for malignant neoplasm of breast: Secondary | ICD-10-CM

## 2011-07-01 ENCOUNTER — Other Ambulatory Visit (INDEPENDENT_AMBULATORY_CARE_PROVIDER_SITE_OTHER): Payer: Medicare Other

## 2011-07-01 DIAGNOSIS — D649 Anemia, unspecified: Secondary | ICD-10-CM

## 2011-07-01 LAB — CBC WITH DIFFERENTIAL/PLATELET
Basophils Absolute: 0 10*3/uL (ref 0.0–0.1)
Eosinophils Absolute: 0.1 10*3/uL (ref 0.0–0.7)
Lymphocytes Relative: 22.3 % (ref 12.0–46.0)
MCHC: 32.1 g/dL (ref 30.0–36.0)
Neutrophils Relative %: 63.2 % (ref 43.0–77.0)
RDW: 15.3 % — ABNORMAL HIGH (ref 11.5–14.6)

## 2011-07-02 ENCOUNTER — Other Ambulatory Visit: Payer: Self-pay

## 2011-07-02 DIAGNOSIS — D649 Anemia, unspecified: Secondary | ICD-10-CM

## 2011-07-02 NOTE — Progress Notes (Signed)
Quick Note:  Hgb about the same - almost normal Stay on ferrous sulfate  Cbc and ferritin in 4 months  Please cc her pcp Dr. Laurann Montana ______

## 2011-07-08 ENCOUNTER — Ambulatory Visit: Payer: Medicare Other

## 2011-07-09 ENCOUNTER — Ambulatory Visit: Payer: Medicare Other

## 2011-07-18 ENCOUNTER — Ambulatory Visit
Admission: RE | Admit: 2011-07-18 | Discharge: 2011-07-18 | Disposition: A | Discharge status: Home / Self Care | Payer: Medicare Other | Source: Ambulatory Visit | Attending: Family Medicine | Admitting: Family Medicine

## 2011-07-18 DIAGNOSIS — Z1231 Encounter for screening mammogram for malignant neoplasm of breast: Secondary | ICD-10-CM

## 2011-07-24 ENCOUNTER — Other Ambulatory Visit: Payer: Self-pay | Admitting: Family Medicine

## 2011-07-24 DIAGNOSIS — N644 Mastodynia: Secondary | ICD-10-CM

## 2011-07-29 ENCOUNTER — Ambulatory Visit
Admission: RE | Admit: 2011-07-29 | Discharge: 2011-07-29 | Disposition: A | Discharge status: Home / Self Care | Payer: Medicare Other | Source: Ambulatory Visit | Attending: Family Medicine | Admitting: Family Medicine

## 2011-07-29 DIAGNOSIS — N644 Mastodynia: Secondary | ICD-10-CM

## 2011-10-16 ENCOUNTER — Encounter: Payer: Self-pay | Admitting: Cardiology

## 2011-10-16 ENCOUNTER — Ambulatory Visit (INDEPENDENT_AMBULATORY_CARE_PROVIDER_SITE_OTHER): Payer: Medicare Other | Admitting: Cardiology

## 2011-10-16 VITALS — BP 119/70 | Ht 64.0 in | Wt 144.8 lb

## 2011-10-16 DIAGNOSIS — D500 Iron deficiency anemia secondary to blood loss (chronic): Secondary | ICD-10-CM

## 2011-10-16 DIAGNOSIS — I2510 Atherosclerotic heart disease of native coronary artery without angina pectoris: Secondary | ICD-10-CM

## 2011-10-16 DIAGNOSIS — M3500 Sicca syndrome, unspecified: Secondary | ICD-10-CM

## 2011-10-16 DIAGNOSIS — E785 Hyperlipidemia, unspecified: Secondary | ICD-10-CM

## 2011-10-16 DIAGNOSIS — I498 Other specified cardiac arrhythmias: Secondary | ICD-10-CM

## 2011-10-16 NOTE — Progress Notes (Signed)
HPI:  The patient is in for followup. She says it from a cardiac standpoint she is doing great. She is bothered by her Sjogren's syndrome. Otherwise, however, she's had no specific cardiac complaints.  Current Outpatient Prescriptions  Medication Sig Dispense Refill  . amLODipine (NORVASC) 5 MG tablet Take 5 mg by mouth daily.        . Cholecalciferol (VITAMIN D3) 1000 UNITS CAPS Take 1 tablet by mouth daily.        . Coenzyme Q10 (COQ-10 PO) Take by mouth daily.      . Flaxseed, Linseed, (FLAX SEEDS PO) Take by mouth daily.      [name] glucosamine-chondroitin 500-400 MG tablet Take 1 tablet by mouth 3 (three) times daily.      . Multiple Vitamin (MULTIVITAMIN) capsule Take 1 capsule by mouth daily.        . nitroGLYCERIN (NITROSTAT) 0.4 MG SL tablet Place 1 tablet (0.4 mg total) under the tongue every 5 (five) minutes as needed for chest pain.  25 tablet  3  . omeprazole (PRILOSEC) 20 MG capsule Take 20 mg by mouth daily.          Allergies  Allergen Reactions  . Ezetimibe   . Ezetimibe-Simvastatin   . Morphine   . Rosuvastatin     Past Medical History  Diagnosis Date  . DYSLIPIDEMIA   . IRON DEFICIENCY ANEMIA SECONDARY TO BLOOD LOSS   . CORONARY ARTERY DISEASE   . Right bundle branch block   . SUPRAVENTRICULAR TACHYCARDIA   . Raynaud's syndrome   . EXTERNAL HEMORRHOIDS   . GERD   . GASTRIC ANTRAL VASCULAR ECTASIA   . ENDOMETRIOSIS   . ACNE ROSACEA   . SJOGREN'S SYNDROME   . OSTEOARTHRITIS   . Rosacea, acne   . COLONIC POLYPS, ADENOMATOUS, HX OF   . HEADACHE, CHRONIC, HX OF     Past Surgical History  Procedure Date  . Cholecystectomy   . Bilateral oophorectomy   . Breast lumpectomy     left  . Hiatal hernia repair 2005    and paraesophageal  . Esophagogastroduodenoscopy 11/27/2009    APC tretment of lesions  . Colonoscopy w/ biopsies and polypectomy 12/17/2006    adenomatous polyps, external hemorrhoids  . Tear duct surgery     right  . Total abdominal  hysterectomy     Family History  Problem Relation Age of Onset  . Heart failure Mother   . Breast cancer Sister     x 3 sisters  . Diabetes Mother   . Kidney failure Mother   . Heart disease Father   . Breast cancer Daughter   . Colon cancer Neg Hx     History   Social History  . Marital Status: Married    Spouse Name: N/A    Number of Children: N/A  . Years of Education: N/A   Occupational History  . Not on file.   Social History Main Topics  . Smoking status: Never Smoker   . Smokeless tobacco: Never Used  . Alcohol Use: Yes     occasional  . Drug Use: No  . Sexually Active: Not on file   Other Topics Concern  . Not on file   Social History Narrative  . No narrative on file    ROS: Please see the HPI.  All other systems reviewed and negative.  PHYSICAL EXAM:  BP 119/70  Ht 5\' 4"  (1.626 m)  Wt 144 lb 12.8 oz (65.681 kg)  BMI 24.85 kg/m2  General: Well developed, well nourished, in no acute distress. Head:  Normocephalic and atraumatic. Neck: no JVD Lungs: Clear to auscultation and percussion. Heart: Normal S1 and S2.  No murmur, rubs or gallops.  Pulses: Pulses normal in all 4 extremities. Extremities: No clubbing or cyanosis. No edema. Neurologic: Alert and oriented x 3.  EKG:  NSR.  IRBBB.    ASSESSMENT AND PLAN:

## 2011-10-16 NOTE — Patient Instructions (Signed)
Your physician wants you to follow-up in: 6 MONTHS.  You will receive a reminder letter in the mail two months in advance. If you don't receive a letter, please call our office to schedule the follow-up appointment.  Your physician recommends that you continue on your current medications as directed. Please refer to the Current Medication list given to you today.  

## 2011-10-19 NOTE — Assessment & Plan Note (Signed)
No clinical recurrence.

## 2011-10-19 NOTE — Assessment & Plan Note (Signed)
Biggest source of symptoms for her.

## 2011-10-19 NOTE — Assessment & Plan Note (Signed)
She seems to be doing very well. Continued medical therapy is warranted.

## 2011-10-19 NOTE — Assessment & Plan Note (Signed)
Last hemoglobin 11.8.  Followed by her primary MD.

## 2011-10-19 NOTE — Assessment & Plan Note (Signed)
Statin intolerant 

## 2011-11-13 ENCOUNTER — Other Ambulatory Visit (INDEPENDENT_AMBULATORY_CARE_PROVIDER_SITE_OTHER): Payer: Medicare Other

## 2011-11-13 DIAGNOSIS — D649 Anemia, unspecified: Secondary | ICD-10-CM

## 2011-11-13 LAB — CBC WITH DIFFERENTIAL/PLATELET
Eosinophils Relative: 1.9 % (ref 0.0–5.0)
HCT: 35.1 % — ABNORMAL LOW (ref 36.0–46.0)
Lymphs Abs: 1.5 10*3/uL (ref 0.7–4.0)
MCV: 84.5 fl (ref 78.0–100.0)
Monocytes Absolute: 0.7 10*3/uL (ref 0.1–1.0)
Platelets: 200 10*3/uL (ref 150.0–400.0)
RDW: 15.9 % — ABNORMAL HIGH (ref 11.5–14.6)
WBC: 6.2 10*3/uL (ref 4.5–10.5)

## 2011-11-13 LAB — FERRITIN: Ferritin: 14.5 ng/mL (ref 10.0–291.0)

## 2011-11-16 NOTE — Progress Notes (Signed)
Quick Note:  Hgb stable as is ferritin - iron, would hope for improvement but at least she is maintaining i would like her to see me by October, non-urgent  Please cc Dr. Aram Beecham white on this ______

## 2011-12-25 ENCOUNTER — Ambulatory Visit (INDEPENDENT_AMBULATORY_CARE_PROVIDER_SITE_OTHER): Payer: Medicare Other | Admitting: Internal Medicine

## 2011-12-25 ENCOUNTER — Encounter: Payer: Self-pay | Admitting: Internal Medicine

## 2011-12-25 VITALS — BP 106/64 | HR 84 | Ht 62.75 in | Wt 144.0 lb

## 2011-12-25 DIAGNOSIS — D500 Iron deficiency anemia secondary to blood loss (chronic): Secondary | ICD-10-CM

## 2011-12-25 DIAGNOSIS — R5383 Other fatigue: Secondary | ICD-10-CM

## 2011-12-25 DIAGNOSIS — R5381 Other malaise: Secondary | ICD-10-CM

## 2011-12-25 MED ORDER — FERROUS SULFATE 325 (65 FE) MG PO TABS: 325.0000 mg | ORAL_TABLET | Freq: Two times a day (BID) | ORAL | Status: DC

## 2011-12-25 NOTE — Progress Notes (Signed)
  Subjective:    Patient ID: [name], female    DOB: 07-Oct-1935, 76 y.o.   MRN: [id number]  HPI Patient returns in followup at my suggestion. She continues with a very mild anemia and a persistently low or low normal ferritin. She has fatigue. She has not noted any bleeding. There is some occasional left upper quadrant pain after she eats. Her other symptoms include dizziness when she stands and she thinks it's her blood pressure medicine. She takes iron occasionally. She admits that she is "not a good patient".  Medications, allergies, past medical history, past surgical history, family history and social history are reviewed and updated in the EMR.  Review of Systems As above    Objective:   Physical Exam Well-developed well-nourished no acute distress   Lab Results  Component Value Date   WBC 6.2 11/13/2011   HGB 11.4* 11/13/2011   HCT 35.1* 11/13/2011   MCV 84.5 11/13/2011   PLT 200.0 11/13/2011   Lab Results  Component Value Date   FERRITIN 14.5 11/13/2011       Assessment & Plan:   1. Iron deficiency anemia due to chronic blood loss - with history of gastric antral vascular ectasia status post ablation   2. Fatigue    1. I've encouraged her to take ferrous sulfate 325 mg twice daily. I told her that studies have shown that her ferritin were greater than 100 there is a chance she would have less fatigued. I cannot promise that but it's worth pursuing. 2. Then we'll recheck CBC and ferritin in 6 months. 3. She could need repeat EGD and ablation of antral vascular ectasia but I don't think we should do that now. 4. I encouraged her to ask her primary care physician about her dizzy spells and her blood pressure medicine. Perhaps she needs a lower dose at this point. I don't have that total history and she should talk to Dr. Cliffton Asters.   CC: Cala Bradford, MD

## 2011-12-25 NOTE — Patient Instructions (Addendum)
We want you to come back in 6 months to have lab work done.  No appointment needed, the lab is open 7:30AM -5:30PM.  That will be April 2014.  Please take Ferrous Sulfate 325mg  twice a day.  This is over the counter.  Thank you for choosing me and [facility].  Iva Boop, M.D., [facility]

## 2012-03-22 ENCOUNTER — Ambulatory Visit (INDEPENDENT_AMBULATORY_CARE_PROVIDER_SITE_OTHER): Payer: Medicare Other | Admitting: Cardiology

## 2012-03-22 ENCOUNTER — Encounter: Payer: Self-pay | Admitting: Cardiology

## 2012-03-22 VITALS — BP 118/76 | HR 70 | Ht 63.0 in | Wt 144.0 lb

## 2012-03-22 DIAGNOSIS — E785 Hyperlipidemia, unspecified: Secondary | ICD-10-CM

## 2012-03-22 DIAGNOSIS — I2510 Atherosclerotic heart disease of native coronary artery without angina pectoris: Secondary | ICD-10-CM

## 2012-03-22 DIAGNOSIS — I7300 Raynaud's syndrome without gangrene: Secondary | ICD-10-CM

## 2012-03-22 DIAGNOSIS — K31819 Angiodysplasia of stomach and duodenum without bleeding: Secondary | ICD-10-CM

## 2012-03-22 NOTE — Patient Instructions (Addendum)
Your physician wants you to follow-up in: 6 MONTHS with Dr Cooper.  You will receive a reminder letter in the mail two months in advance. If you don't receive a letter, please call our office to schedule the follow-up appointment.  Your physician recommends that you continue on your current medications as directed. Please refer to the Current Medication list given to you today.  

## 2012-03-22 NOTE — Assessment & Plan Note (Signed)
Chronic ongoing issue.

## 2012-03-22 NOTE — Assessment & Plan Note (Signed)
The patient's last catheterization procedure was in 2006. She's continued to remain stable on medical regimen.  She's not been on antiplatelet agents, partially because she has a gastrointestinal issue as well as a  chronic iron deficiency anemia.  She will remain on medical treatment however (calcium antagonist)

## 2012-03-22 NOTE — Assessment & Plan Note (Signed)
Followed by her primary.  Poor tolerance to statins.

## 2012-03-22 NOTE — Progress Notes (Signed)
HPI:  Overall the patient is stable. She says she feels reasonably good. Her dose of iron was increased with her ferritin was noted to be low. She's continue to followup with Dr. Leone Payor. She's had no active ongoing cardiac symptoms at present.  Her Sjogren's syndrome has been relatively quiescent. Denies chest pain  Current Outpatient Prescriptions  Medication Sig Dispense Refill  . amLODipine (NORVASC) 5 MG tablet Take 5 mg by mouth daily.        . Cholecalciferol (VITAMIN D3) 1000 UNITS CAPS Take 1 tablet by mouth daily.        . Coenzyme Q10 (COQ-10 PO) Take by mouth daily.      . ferrous sulfate 325 (65 FE) MG tablet Take 1 tablet (325 mg total) by mouth 2 (two) times daily.      . Flaxseed, Linseed, (FLAX SEEDS PO) Take by mouth daily.      [name] glucosamine-chondroitin 500-400 MG tablet Take 1 tablet by mouth daily.       . Multiple Vitamin (MULTIVITAMIN) capsule Take 1 capsule by mouth daily.        [name] omeprazole (PRILOSEC) 20 MG capsule Take 20 mg by mouth daily.        . nitroGLYCERIN (NITROSTAT) 0.4 MG SL tablet Place 1 tablet (0.4 mg total) under the tongue every 5 (five) minutes as needed for chest pain.  25 tablet  3    Allergies  Allergen Reactions  . Ezetimibe   . Ezetimibe-Simvastatin   . Morphine   . Rosuvastatin     Past Medical History  Diagnosis Date  . DYSLIPIDEMIA   . IRON DEFICIENCY ANEMIA SECONDARY TO BLOOD LOSS   . CORONARY ARTERY DISEASE   . Right bundle branch block   . SUPRAVENTRICULAR TACHYCARDIA   . Raynaud's syndrome   . EXTERNAL HEMORRHOIDS   . GERD   . GASTRIC ANTRAL VASCULAR ECTASIA   . ENDOMETRIOSIS   . ACNE ROSACEA   . SJOGREN'S SYNDROME   . OSTEOARTHRITIS   . Rosacea, acne   . COLONIC POLYPS, ADENOMATOUS, HX OF   . HEADACHE, CHRONIC, HX OF     Past Surgical History  Procedure Date  . Cholecystectomy   . Bilateral oophorectomy   . Breast lumpectomy     left  . Hiatal hernia repair 2005    and paraesophageal  .  Esophagogastroduodenoscopy 11/27/2009    APC tretment of lesions  . Colonoscopy w/ biopsies and polypectomy 12/17/2006    adenomatous polyps, external hemorrhoids  . Tear duct surgery     right  . Total abdominal hysterectomy     Family History  Problem Relation Age of Onset  . Heart failure Mother   . Breast cancer Sister     x 3 sisters  . Diabetes Mother   . Kidney failure Mother   . Heart disease Father   . Breast cancer Daughter   . Colon cancer Neg Hx     History   Social History  . Marital Status: Married    Spouse Name: N/A    Number of Children: N/A  . Years of Education: N/A   Occupational History  . Not on file.   Social History Main Topics  . Smoking status: Never Smoker   . Smokeless tobacco: Never Used  . Alcohol Use: Yes     Comment: occasional  . Drug Use: No  . Sexually Active: Not on file   Other Topics Concern  . Not on file  Social History Narrative  . No narrative on file    ROS: Please see the HPI.  All other systems reviewed and negative.  PHYSICAL EXAM:  BP 118/76  Pulse 70  Ht 5\' 3"  (1.6 m)  Wt 144 lb (65.318 kg)  BMI 25.51 kg/m2  SpO2 95%  General: Well developed, well nourished, in no acute distress. Head:  Normocephalic and atraumatic. Neck: no JVD Lungs: Clear to auscultation and percussion. Heart: Normal S1 and S2.  No murmur, rubs or gallops.  Pulses: Pulses normal in all 4 extremities. Extremities: No clubbing or cyanosis. No edema. Neurologic: Alert and oriented x 3.  EKG:  NSR.  WNL.    ASSESSMENT AND PLAN:

## 2012-03-22 NOTE — Assessment & Plan Note (Signed)
Followed by Dr. Leone Payor.  Not on antiplatelet for the above reason.

## 2012-05-04 ENCOUNTER — Encounter: Payer: Self-pay | Admitting: Internal Medicine

## 2012-05-21 ENCOUNTER — Emergency Department (HOSPITAL_COMMUNITY)
Admission: EM | Admit: 2012-05-21 | Discharge: 2012-05-21 | Disposition: A | Discharge status: Home / Self Care | Payer: Medicare Other | Source: Home / Self Care | Attending: Emergency Medicine | Admitting: Emergency Medicine

## 2012-05-21 ENCOUNTER — Telehealth: Payer: Self-pay | Admitting: Nurse Practitioner

## 2012-05-21 ENCOUNTER — Encounter (HOSPITAL_COMMUNITY): Payer: Self-pay | Admitting: Emergency Medicine

## 2012-05-21 DIAGNOSIS — R002 Palpitations: Secondary | ICD-10-CM

## 2012-05-21 DIAGNOSIS — Z0000 Encounter for general adult medical examination without abnormal findings: Secondary | ICD-10-CM

## 2012-05-21 NOTE — ED Notes (Signed)
Pt is here for left shoulder pain since 0100 this am Reports the pain woke her up; felt her heart "racing"; BP was 160/99 and pulse was 91 Sx include: headache, SOB Took nitrostat around 0130 She called her cardiologist and was told to come here to have and EKG done Also reports being sick 4 weeks ago; taking Zpack and cough syrup  She is alert, oriented and talking in complete sentences w/no signs of acute distress

## 2012-05-21 NOTE — ED Provider Notes (Signed)
History     CSN: [id number]  Arrival date & time 05/21/12  1355   First MD Initiated Contact with Patient 05/21/12 1522      Chief Complaint  Patient presents with  . Palpitations    (Consider location/radiation/quality/duration/timing/severity/associated sxs/prior treatment) HPI Comments: At 1am last night pt woke up with L shoulder "discomfort, not really pain" that she also felt in her back.  Also was having rapid heart beat.  Took a nitro and shoulder discomfort improved, rapid heart rate continued for a couple of hours then spontaneously resolved.  Was having rapid heart rate again this morning, called Dr. Rosalyn Charters office (pt's cardiologist) and she was instructed to go to an urgent care to have an ekg. Rapid heart rate is resolved. Pt feels fine at this time.   Patient is a 77 y.o. female presenting with palpitations. The history is provided by the patient.  Palpitations  This is a new problem. The current episode started 12 to 24 hours ago. The problem occurs rarely. The problem has been resolved. On average, each episode lasts 2 hours. Associated symptoms include irregular heartbeat. Pertinent negatives include no fever, no chest pain, no dizziness and no shortness of breath. Treatments tried: nitro. The treatment provided significant relief. Her past medical history is significant for heart disease.    Past Medical History  Diagnosis Date  . DYSLIPIDEMIA   . IRON DEFICIENCY ANEMIA SECONDARY TO BLOOD LOSS   . CORONARY ARTERY DISEASE   . Right bundle branch block   . SUPRAVENTRICULAR TACHYCARDIA   . Raynaud's syndrome   . EXTERNAL HEMORRHOIDS   . GERD   . GASTRIC ANTRAL VASCULAR ECTASIA   . ENDOMETRIOSIS   . ACNE ROSACEA   . SJOGREN'S SYNDROME   . OSTEOARTHRITIS   . Rosacea, acne   . COLONIC POLYPS, ADENOMATOUS, HX OF   . HEADACHE, CHRONIC, HX OF     Past Surgical History  Procedure Laterality Date  . Cholecystectomy    . Bilateral oophorectomy    . Breast  lumpectomy      left  . Hiatal hernia repair  2005    and paraesophageal  . Esophagogastroduodenoscopy  11/27/2009    APC tretment of lesions  . Colonoscopy w/ biopsies and polypectomy  12/17/2006    adenomatous polyps, external hemorrhoids  . Tear duct surgery      right  . Total abdominal hysterectomy      Family History  Problem Relation Age of Onset  . Heart failure Mother   . Breast cancer Sister     x 3 sisters  . Diabetes Mother   . Kidney failure Mother   . Heart disease Father   . Breast cancer Daughter   . Colon cancer Neg Hx     History  Substance Use Topics  . Smoking status: Never Smoker   . Smokeless tobacco: Never Used  . Alcohol Use: Yes     Comment: occasional    OB History   Grav Para Term Preterm Abortions TAB SAB Ect Mult Living                  Review of Systems  Constitutional: Negative for fever and chills.  Respiratory: Negative for shortness of breath.   Cardiovascular: Positive for palpitations. Negative for chest pain.       L shoulder and back discomfort  Neurological: Negative for dizziness and syncope.    Allergies  Ezetimibe; Ezetimibe-simvastatin; Morphine; and Rosuvastatin  Home Medications   Current  Outpatient Rx  Name  Route  Sig  Dispense  Refill  . amLODipine (NORVASC) 5 MG tablet   Oral   Take 5 mg by mouth daily.           [name] omeprazole (PRILOSEC) 20 MG capsule   Oral   Take 20 mg by mouth daily.           . Cholecalciferol (VITAMIN D3) 1000 UNITS CAPS   Oral   Take 1 tablet by mouth daily.           . Coenzyme Q10 (COQ-10 PO)   Oral   Take by mouth daily.         . ferrous sulfate 325 (65 FE) MG tablet   Oral   Take 1 tablet (325 mg total) by mouth 2 (two) times daily.         . Flaxseed, Linseed, (FLAX SEEDS PO)   Oral   Take by mouth daily.         [name] glucosamine-chondroitin 500-400 MG tablet   Oral   Take 1 tablet by mouth daily.          . Multiple Vitamin (MULTIVITAMIN) capsule    Oral   Take 1 capsule by mouth daily.           [name] EXPIRED: nitroGLYCERIN (NITROSTAT) 0.4 MG SL tablet   Sublingual   Place 1 tablet (0.4 mg total) under the tongue every 5 (five) minutes as needed for chest pain.   25 tablet   3     BP 154/80  Pulse 88  Temp(Src) 98.7 F (37.1 C) (Oral)  Resp 18  Physical Exam  Constitutional: She is oriented to person, place, and time. She appears well-developed and well-nourished. No distress.  Cardiovascular: Normal rate and regular rhythm.   Pulmonary/Chest: Effort normal and breath sounds normal.  Neurological: She is alert and oriented to person, place, and time.  Skin: Skin is warm and dry.    ED Course  Procedures (including critical care time)  Labs Reviewed - No data to display No results found.   1. Normal physical exam       MDM  Discussed pt with Ward Givens, NP from [facility] cardiology.  He had spoken with pt this morning and instructed her to get an ekg but his clinic was closed.  Discussed ekg (NSR, 70bpm) with Mr. Brion Aliment. He is pleased with results. Pt ok to go home.         Cathlyn Parsons, NP 05/21/12 1714

## 2012-05-21 NOTE — Telephone Encounter (Signed)
Pt called this AM stating that she awoke @ 1 AM with palpitations, irregular heart rhythm, and left shoulder pain.  The pain eased off relatively quickly but she continued to note palpitations for about 2 more hours with rates in the 90's to low 100's.  When she got up a few hrs ago, she cont to note palpitations, though is currently otherwise asymptomatic.  I advised that she would benefit from having an ecg today.  Unfortunately, our office is closed.  Therefor, I rec that she present to a local urgent care for eval.  She is going to go to [facility] today.

## 2012-05-21 NOTE — ED Provider Notes (Signed)
Medical screening examination/treatment/procedure(s) were performed by non-physician practitioner and as supervising physician I was immediately available for consultation/collaboration.  David Keller, M.D.  David C Keller, MD 05/21/12 2016 

## 2012-05-24 ENCOUNTER — Telehealth: Payer: Self-pay | Admitting: Cardiology

## 2012-05-25 NOTE — Telephone Encounter (Signed)
Left message to call back. At this time the pt's EKG from Urgent Care has not been scanned into the EPIC system.

## 2012-05-25 NOTE — Telephone Encounter (Signed)
I spoke with the pt and made her aware that at this time Urgent Care has not scanned her EKG into the system. I discussed with the pt what happened on Friday morning and she has not had any further episodes since that time.  The pt has been battling bronchitis/pneumonia like symptoms for 3 WEEKS.  The pt has been seen by her PCP and Urgent Care for these symptoms, completed 2 rounds of antibiotics and has not had a chest x-ray.  The pt has used some over the counter decongestants for her symptoms. I instructed the pt to call her PCP and arrange another appointment for evaluation of symptoms.  I made the pt aware that she needs to avoid medications with decongestants.  The pt will contact our office with any other questions or concerns.

## 2012-05-25 NOTE — Telephone Encounter (Signed)
New problem   Patient called on yesterday was advise that nurse and MD were off.   Was seen at [facility]  Urgent care wanted to know has Dr. Riley Kill review her EKG.  Please called to discuss

## 2012-07-15 ENCOUNTER — Encounter: Payer: Self-pay | Admitting: Internal Medicine

## 2012-07-19 ENCOUNTER — Other Ambulatory Visit: Payer: Self-pay

## 2012-07-19 DIAGNOSIS — Z1231 Encounter for screening mammogram for malignant neoplasm of breast: Secondary | ICD-10-CM

## 2012-08-18 ENCOUNTER — Ambulatory Visit
Admission: RE | Admit: 2012-08-18 | Discharge: 2012-08-18 | Disposition: A | Discharge status: Home / Self Care | Payer: Medicare Other | Source: Ambulatory Visit

## 2012-08-18 DIAGNOSIS — Z1231 Encounter for screening mammogram for malignant neoplasm of breast: Secondary | ICD-10-CM

## 2012-10-04 ENCOUNTER — Encounter: Payer: Self-pay | Admitting: Cardiovascular Disease

## 2012-10-04 ENCOUNTER — Ambulatory Visit (INDEPENDENT_AMBULATORY_CARE_PROVIDER_SITE_OTHER): Payer: Medicare Other | Admitting: Cardiovascular Disease

## 2012-10-04 VITALS — BP 108/60 | HR 62 | Ht 63.0 in | Wt 133.0 lb

## 2012-10-04 DIAGNOSIS — I2510 Atherosclerotic heart disease of native coronary artery without angina pectoris: Secondary | ICD-10-CM

## 2012-10-04 NOTE — Progress Notes (Signed)
HPI:  77 year old woman presenting for followup evaluation. She has been previously followed by Dr. Riley Kill. The patient has coronary artery disease and underwent cardiac catheterization in 2006. She had mild to moderate coronary artery disease noted at that time. Medical therapy was recommended. She has preserved left ventricular systolic function. Last echocardiogram in 2012 showed normal LV systolic function with mild to moderate aortic insufficiency and moderate tricuspid regurgitation. She's had chronic iron deficiency anemia and has not been treated with antiplatelet therapy. She's also been intolerant to statin drugs.  She reports no new cardiovascular symptoms. She denies chest pain or pressure, exertional dyspnea, or leg swelling. She's had no palpitations, orthopnea, or PND. She continues to struggle with issues related to her autoimmune disease, including arthritis in the hands and multiple other joints. She is considering low-dose prednisone on chronic basis.  Outpatient Encounter Prescriptions as of 10/04/2012  Medication Sig Dispense Refill  . amLODipine (NORVASC) 5 MG tablet Take 5 mg by mouth daily.        . Cholecalciferol (VITAMIN D3) 1000 UNITS CAPS Take 1 tablet by mouth daily.        . Coenzyme Q10 (COQ-10 PO) Take by mouth daily.      . ferrous sulfate 325 (65 FE) MG tablet Take 1 tablet (325 mg total) by mouth 2 (two) times daily.      . Flaxseed, Linseed, (FLAX SEEDS PO) Take by mouth daily.      [name] glucosamine-chondroitin 500-400 MG tablet Take 1 tablet by mouth daily.       . Multiple Vitamin (MULTIVITAMIN) capsule Take 1 capsule by mouth daily.        . nitroGLYCERIN (NITROSTAT) 0.4 MG SL tablet Place 0.4 mg under the tongue every 5 (five) minutes as needed for chest pain.      [name] omeprazole (PRILOSEC) 20 MG capsule Take 20 mg by mouth daily.        . [DISCONTINUED] nitroGLYCERIN (NITROSTAT) 0.4 MG SL tablet Place 1 tablet (0.4 mg total) under the tongue every 5 (five)  minutes as needed for chest pain.  25 tablet  3   No facility-administered encounter medications on file as of 10/04/2012.    Allergies  Allergen Reactions  . Ezetimibe   . Ezetimibe-Simvastatin   . Morphine   . Rosuvastatin     Past Medical History  Diagnosis Date  . DYSLIPIDEMIA   . IRON DEFICIENCY ANEMIA SECONDARY TO BLOOD LOSS   . CORONARY ARTERY DISEASE   . Right bundle branch block   . SUPRAVENTRICULAR TACHYCARDIA   . Raynaud's syndrome   . EXTERNAL HEMORRHOIDS   . GERD   . GASTRIC ANTRAL VASCULAR ECTASIA   . ENDOMETRIOSIS   . ACNE ROSACEA   . SJOGREN'S SYNDROME   . OSTEOARTHRITIS   . Rosacea, acne   . COLONIC POLYPS, ADENOMATOUS, HX OF   . HEADACHE, CHRONIC, HX OF     ROS: Negative except as per HPI  BP 108/60  Pulse 62  Ht 5\' 3"  (1.6 m)  Wt 133 lb (60.328 kg)  BMI 23.57 kg/m2  PHYSICAL EXAM: Pt is alert and oriented, NAD HEENT: normal Neck: JVP - normal, carotids 2+= without bruits Lungs: CTA bilaterally CV: RRR without murmur or gallop Abd: soft, NT, Positive BS, no hepatomegaly Ext: no C/C/E, distal pulses intact and equal Skin: Changes of Raynaud's with the toes on both feet  ASSESSMENT AND PLAN: CAD, native vessel. The patient has no history of myocardial infarction or angina. She  seems to be doing well on minimal cardiac treatment. She cannot take antiplatelet or lipid-lowering drugs because of intolerances. Will continue a period of observation and see her back in one year for followup.  Tonny Bollman 10/04/2012 12:32 PM

## 2012-10-04 NOTE — Patient Instructions (Signed)
Your physician wants you to follow-up in: 1 YEAR with Dr Cooper.  You will receive a reminder letter in the mail two months in advance. If you don't receive a letter, please call our office to schedule the follow-up appointment.  Your physician recommends that you continue on your current medications as directed. Please refer to the Current Medication list given to you today.  

## 2012-12-16 ENCOUNTER — Other Ambulatory Visit: Payer: Self-pay | Admitting: Family Medicine

## 2012-12-16 ENCOUNTER — Ambulatory Visit
Admission: RE | Admit: 2012-12-16 | Discharge: 2012-12-16 | Disposition: A | Discharge status: Home / Self Care | Payer: Medicare Other | Source: Ambulatory Visit | Attending: Family Medicine | Admitting: Family Medicine

## 2012-12-16 DIAGNOSIS — M26609 Unspecified temporomandibular joint disorder, unspecified side: Secondary | ICD-10-CM

## 2012-12-30 ENCOUNTER — Other Ambulatory Visit (HOSPITAL_COMMUNITY): Payer: Self-pay | Admitting: Otolaryngology

## 2012-12-30 ENCOUNTER — Ambulatory Visit (HOSPITAL_COMMUNITY)
Admission: RE | Admit: 2012-12-30 | Discharge: 2012-12-30 | Disposition: A | Discharge status: Home / Self Care | Payer: Medicare Other | Source: Ambulatory Visit | Attending: Otolaryngology | Admitting: Otolaryngology

## 2012-12-30 DIAGNOSIS — K0820 Unspecified atrophy of edentulous alveolar ridge: Secondary | ICD-10-CM | POA: Insufficient documentation

## 2012-12-30 DIAGNOSIS — K08409 Partial loss of teeth, unspecified cause, unspecified class: Secondary | ICD-10-CM | POA: Insufficient documentation

## 2012-12-30 DIAGNOSIS — K08109 Complete loss of teeth, unspecified cause, unspecified class: Secondary | ICD-10-CM | POA: Insufficient documentation

## 2013-01-21 ENCOUNTER — Encounter: Payer: Self-pay | Admitting: Internal Medicine

## 2013-03-03 ENCOUNTER — Encounter: Payer: Medicare Other | Admitting: Internal Medicine

## 2013-03-24 ENCOUNTER — Ambulatory Visit (AMBULATORY_SURGERY_CENTER): Payer: Medicare Other | Admitting: *Deleted

## 2013-03-24 VITALS — Ht 64.0 in | Wt 131.8 lb

## 2013-03-24 DIAGNOSIS — Z86010 Personal history of colonic polyps: Secondary | ICD-10-CM

## 2013-03-24 MED ORDER — NA SULFATE-K SULFATE-MG SULF 17.5-3.13-1.6 GM/177ML PO SOLN: 1.0000 | Freq: Once | ORAL | Status: DC

## 2013-03-24 NOTE — Progress Notes (Signed)
No allergies to eggs or soy. No problems with anesthesia.  

## 2013-03-25 ENCOUNTER — Encounter: Payer: Self-pay | Admitting: Internal Medicine

## 2013-03-31 ENCOUNTER — Telehealth: Payer: Self-pay | Admitting: Internal Medicine

## 2013-03-31 NOTE — Telephone Encounter (Signed)
Pt will come by [facility] 4th floor and get sample of Suprep

## 2013-04-07 ENCOUNTER — Ambulatory Visit (AMBULATORY_SURGERY_CENTER): Payer: Medicare Other | Admitting: Internal Medicine

## 2013-04-07 ENCOUNTER — Encounter: Payer: Self-pay | Admitting: Internal Medicine

## 2013-04-07 VITALS — BP 111/56 | HR 63 | Temp 96.2°F | Resp 16 | Ht 64.0 in | Wt 131.0 lb

## 2013-04-07 DIAGNOSIS — K5730 Diverticulosis of large intestine without perforation or abscess without bleeding: Secondary | ICD-10-CM

## 2013-04-07 DIAGNOSIS — Z86010 Personal history of colonic polyps: Secondary | ICD-10-CM

## 2013-04-07 MED ORDER — SODIUM CHLORIDE 0.9 % IV SOLN: 500.0000 mL | INTRAVENOUS | Status: DC

## 2013-04-07 NOTE — Progress Notes (Signed)
Report to pacu rn, vss, bbs=clear 

## 2013-04-07 NOTE — Patient Instructions (Addendum)
Congratulations on your last routine colonoscopy! No polyps. You do have a condition called diverticulosis - common and not usually a problem. Please read the handout provided.  It is the time of year to have a vaccination to prevent the flu (influenza virus). Please have this done (if not yet done) through your primary care provider or you can get this done at local pharmacies or the [facility]. It would be very helpful if you notify your primary care provider when and where you had the vaccination given by messaging them in My Chart, leaving a message or faxing the information.  I appreciate the opportunity to care for you. Iva Boop, MD, [facility]  Discharge instructions given with verbal understanding. Handout on diverticulosis. Resume previous medications. YOU HAD AN ENDOSCOPIC PROCEDURE TODAY AT THE [facility]: Refer to the procedure report that was given to you for any specific questions about what was found during the examination.  If the procedure report does not answer your questions, please call your gastroenterologist to clarify.  If you requested that your care partner not be given the details of your procedure findings, then the procedure report has been included in a sealed envelope for you to review at your convenience later.  YOU SHOULD EXPECT: Some feelings of bloating in the abdomen. Passage of more gas than usual.  Walking can help get rid of the air that was put into your GI tract during the procedure and reduce the bloating. If you had a lower endoscopy (such as a colonoscopy or flexible sigmoidoscopy) you may notice spotting of blood in your stool or on the toilet paper. If you underwent a bowel prep for your procedure, then you may not have a normal bowel movement for a few days.  DIET: Your first meal following the procedure should be a light meal and then it is ok to progress to your normal diet.  A half-sandwich or bowl of soup is an example of a good  first meal.  Heavy or fried foods are harder to digest and may make you feel nauseous or bloated.  Likewise meals heavy in dairy and vegetables can cause extra gas to form and this can also increase the bloating.  Drink plenty of fluids but you should avoid alcoholic beverages for 24 hours.  ACTIVITY: Your care partner should take you home directly after the procedure.  You should plan to take it easy, moving slowly for the rest of the day.  You can resume normal activity the day after the procedure however you should NOT DRIVE or use heavy machinery for 24 hours (because of the sedation medicines used during the test).    SYMPTOMS TO REPORT IMMEDIATELY: A gastroenterologist can be reached at any hour.  During normal business hours, 8:30 AM to 5:00 PM Monday through Friday, call [phone].  After hours and on weekends, please call the GI answering service at [phone] who will take a message and have the physician on call contact you.   Following lower endoscopy (colonoscopy or flexible sigmoidoscopy):  Excessive amounts of blood in the stool  Significant tenderness or worsening of abdominal pains  Swelling of the abdomen that is new, acute  Fever of 100F or higher FOLLOW UP: If any biopsies were taken you will be contacted by phone or by letter within the next 1-3 weeks.  Call your gastroenterologist if you have not heard about the biopsies in 3 weeks.  Our staff will call the home number listed  on your records the next business day following your procedure to check on you and address any questions or concerns that you may have at that time regarding the information given to you following your procedure. This is a courtesy call and so if there is no answer at the home number and we have not heard from you through the emergency physician on call, we will assume that you have returned to your regular daily activities without incident.  SIGNATURES/CONFIDENTIALITY: You and/or your care  partner have signed paperwork which will be entered into your electronic medical record.  These signatures attest to the fact that that the information above on your After Visit Summary has been reviewed and is understood.  Full responsibility of the confidentiality of this discharge information lies with you and/or your care-partner.

## 2013-04-07 NOTE — Op Note (Addendum)
[facility] [address] [place], [address]   COLONOSCOPY PROCEDURE REPORT  PATIENT: [name], [name]  MR#: [id number] BIRTHDATE: 1935-09-28 , 77  yrs. old GENDER: Female ENDOSCOPIST: Iva Boop, MD, [facility] PROCEDURE DATE:  04/07/2013 PROCEDURE:   Colonoscopy, surveillance First Screening Colonoscopy - Avg.  risk and is 50 yrs.  old or older - No.  Prior Negative Screening - Now for repeat screening. N/A  History of Adenoma - Now for follow-up colonoscopy & has been > or = to 3 yrs.  Yes hx of adenoma.  Has been 3 or more years since last colonoscopy.  Polyps Removed Today? No.  Recommend repeat exam, <10 yrs? No. ASA CLASS:   Class III INDICATIONS:Patient's personal history of adenomatous colon polyps.  MEDICATIONS: propofol (Diprivan) 250mg  IV, MAC sedation, administered by CRNA, and These medications were titrated to patient response per physician's verbal order  DESCRIPTION OF PROCEDURE:   After the risks benefits and alternatives of the procedure were thoroughly explained, informed consent was obtained.  A digital rectal exam revealed no abnormalities of the rectum.   The LB [id number]  endoscope was introduced through the anus and advanced to the cecum, which was identified by both the appendix and ileocecal valve. No adverse events experienced.   The quality of the prep was excellent using Suprep  The instrument was then slowly withdrawn as the colon was fully examined.      COLON FINDINGS: Mild diverticulosis was noted in the sigmoid colon. The colon mucosa was otherwise normal.   A right colon retroflexion was performed.  Retroflexed views revealed no abnormalities. The time to cecum=3 minutes 07 seconds.  Withdrawal time=8 minutes 33 seconds.  The scope was withdrawn and the procedure completed. COMPLICATIONS: There were no complications.  ENDOSCOPIC IMPRESSION: 1.   Mild diverticulosis was noted in the sigmoid colon 2.   The colon  mucosa was otherwise normal - excellent prep - hx 2 adenomas last 2008  RECOMMENDATIONS: Last routine colonoscopy - follow-up as needed   eSigned:  Iva Boop, MD, [facility] 04/07/2013 8:36 AM Revised: 04/07/2013 8:36 AM  cc: The Patient  and Laurann Montana, MD

## 2013-04-08 ENCOUNTER — Telehealth: Payer: Self-pay

## 2013-04-08 NOTE — Telephone Encounter (Signed)
  Follow up Call-  Call back number 04/07/2013  Post procedure Call Back phone  # [phone]  Permission to leave phone message Yes     Patient questions:  Do you have a fever, pain , or abdominal swelling? no Pain Score  0 *  Have you tolerated food without any problems? yes  Have you been able to return to your normal activities? yes  Do you have any questions about your discharge instructions: Diet   no Medications  no Follow up visit  no  Do you have questions or concerns about your Care? no  Actions: * If pain score is 4 or above: No action needed, pain <4.

## 2013-05-20 ENCOUNTER — Encounter: Payer: Self-pay | Admitting: Neurology

## 2013-05-24 ENCOUNTER — Encounter: Payer: Self-pay | Admitting: Neurology

## 2013-05-24 ENCOUNTER — Ambulatory Visit (INDEPENDENT_AMBULATORY_CARE_PROVIDER_SITE_OTHER): Payer: Medicare Other | Admitting: Neurology

## 2013-05-24 ENCOUNTER — Encounter (INDEPENDENT_AMBULATORY_CARE_PROVIDER_SITE_OTHER): Payer: Self-pay

## 2013-05-24 VITALS — BP 129/66 | HR 80 | Ht 62.6 in | Wt 135.0 lb

## 2013-05-24 DIAGNOSIS — R42 Dizziness and giddiness: Secondary | ICD-10-CM

## 2013-05-24 NOTE — Progress Notes (Signed)
Reason for visit: Vertigo  [name] is a 78 y.o. female  History of present illness:  [name] is a 78 year old right-handed white female with a history of hypertension and Sjogren syndrome. The patient reports a several month history of intermittent vertigo that does not appear to be directly related to any head position. The patient more often notes the dizziness and vertigo when she is driving a car, or focusing on a computer screen. The patient will have true vertigo that may culminate in nausea and vomiting. Episodes may last anywhere from 5-10 minutes. Episodes are not frequent, and are unassociated with headache, numbness or weakness of the face, arms, or legs, or any problems with slurred speech or double vision or loss of vision. The patient notes no confusion, and she has no problems with headaches afterwards. The patient is unable to walk during the events. The patient does have some alteration in hearing in her left ear, and she has the sensation of wavy lines in her vision off to the left. The patient denies any blackout episodes, memory problems, or confusion. The patient does have some slight instability with her walking. The patient is sent to this office for further evaluation. The patient denies any recent changes in medications.  Past Medical History  Diagnosis Date  . DYSLIPIDEMIA   . IRON DEFICIENCY ANEMIA SECONDARY TO BLOOD LOSS   . CORONARY ARTERY DISEASE   . Right bundle branch block   . SUPRAVENTRICULAR TACHYCARDIA   . Raynaud's syndrome   . EXTERNAL HEMORRHOIDS   . GERD   . GASTRIC ANTRAL VASCULAR ECTASIA   . ENDOMETRIOSIS   . ACNE ROSACEA   . SJOGREN'S SYNDROME   . OSTEOARTHRITIS   . Rosacea, acne   . COLONIC POLYPS, ADENOMATOUS, HX OF   . HEADACHE, CHRONIC, HX OF     Past Surgical History  Procedure Laterality Date  . Cholecystectomy    . Bilateral oophorectomy    . Breast lumpectomy      left  . Hiatal hernia repair  2005    and  paraesophageal  . Esophagogastroduodenoscopy  11/27/2009    APC tretment of lesions  . Colonoscopy w/ biopsies and polypectomy  12/17/2006    adenomatous polyps, external hemorrhoids  . Tear duct surgery      right  . Total abdominal hysterectomy      Family History  Problem Relation Age of Onset  . Heart failure Mother   . Diabetes Mother   . Kidney failure Mother   . Hypertension Mother   . Coronary artery disease Mother   . Breast cancer Sister     x 3 sisters  . Heart disease Father   . Hypertension Father   . Coronary artery disease Father   . Breast cancer Daughter   . Colon cancer Neg Hx   . Esophageal cancer Neg Hx   . Stomach cancer Neg Hx   . Rectal cancer Neg Hx   . Coronary artery disease Brother   . Coronary artery disease Brother   . Coronary artery disease Brother   . Breast cancer Sister   . Breast cancer Sister     Social history:  reports that she has never smoked. She has never used smokeless tobacco. She reports that she drinks alcohol. She reports that she does not use illicit drugs.  Medications:  Current Outpatient Prescriptions on File Prior to Visit  Medication Sig Dispense Refill  . amLODipine (NORVASC) 5 MG tablet Take  5 mg by mouth daily.        . calcium citrate-vitamin D (CITRACAL+D) 315-200 MG-UNIT per tablet Take 1 tablet by mouth daily.      . Capsicum, Cayenne, (CAYENNE PO) Take by mouth daily.      . Coenzyme Q10 (COQ-10 PO) Take by mouth daily.      . ferrous sulfate 325 (65 FE) MG tablet Take 1 tablet (325 mg total) by mouth 2 (two) times daily.      . Flaxseed, Linseed, (FLAX SEEDS PO) Take by mouth daily.      [name] glucosamine-chondroitin 500-400 MG tablet Take 1 tablet by mouth daily.       . Multiple Vitamin (MULTIVITAMIN) capsule Take 1 capsule by mouth daily.        . nitroGLYCERIN (NITROSTAT) 0.4 MG SL tablet Place 0.4 mg under the tongue every 5 (five) minutes as needed for chest pain.      . Nutritional Supplements (GRAPESEED  EXTRACT PO) Take by mouth daily.      . TURMERIC CURCUMIN PO Take by mouth daily.       No current facility-administered medications on file prior to visit.      Allergies  Allergen Reactions  . Ezetimibe     Muscle pain  . Ezetimibe-Simvastatin     Muscle pain  . Morphine     nightmares  . Rosuvastatin     Muscle pain  . Ambien [Zolpidem Tartrate] Other (See Comments)    Unknown   . Plaquenil [Hydroxychloroquine Sulfate] Other (See Comments)  . Polytrim [Polymyxin B-Trimethoprim] Other (See Comments)    unknown  . Prevacid [Lansoprazole] Other (See Comments)    dizziness    ROS:  Out of a complete 14 system review of symptoms, the patient complains only of the following symptoms, and all other reviewed systems are negative.  Fatigue Palpitations of the heart Hearing loss, ringing in the ears Eye pain  Blood pressure 129/66, pulse 80, height 5' 2.6" (1.59 m), weight 135 lb (61.236 kg).  Physical Exam  General: The patient is alert and cooperative at the time of the examination.  Eyes: Pupils are equal, round, and reactive to light. Discs are flat bilaterally.  Ears: Tympanic membranes are clear bilaterally.  Neck: The neck is supple, no carotid bruits are noted.  Respiratory: The respiratory examination is clear.  Cardiovascular: The cardiovascular examination reveals a regular rate and rhythm, no obvious murmurs or rubs are noted.  Neuromuscular: The patient lacks about 30 of lateral rotation of the cervical spine to the right, 20 to the left.  Skin: Extremities are without significant edema.  Neurologic Exam  Mental status: The patient is alert and oriented x 3 at the time of the examination. The patient has apparent normal recent and remote memory, with an apparently normal attention span and concentration ability.  Cranial nerves: Facial symmetry is present. There is good sensation of the face to pinprick and soft touch bilaterally. The strength of  the facial muscles and the muscles to head turning and shoulder shrug are normal bilaterally. Speech is well enunciated, no aphasia or dysarthria is noted. Extraocular movements are full. Visual fields are full. The tongue is midline, and the patient has symmetric elevation of the soft palate. No obvious hearing deficits are noted.  Motor: The motor testing reveals 5 over 5 strength of all 4 extremities. Good symmetric motor tone is noted throughout.  Sensory: Sensory testing is intact to pinprick, soft touch, vibration sensation, and position  sense on all 4 extremities. No evidence of extinction is noted.  Coordination: Cerebellar testing reveals good finger-nose-finger and heel-to-shin bilaterally. With the Nyan-Barrany procedure, the patient has no clinical vertigo. In all positions, there appears to be horizontal and rotatory nystagmus when looking to the right, not as prominent when looking to the left.  Gait and station: Gait is normal. Tandem gait is normal. Romberg is negative. No drift is seen.  Reflexes: Deep tendon reflexes are symmetric and normal bilaterally. Toes are downgoing bilaterally.   Assessment/Plan:  1. Episodic vertigo  The patient has brief episodes of true vertigo that are unassociated with other neurologic features. Clinical examination today shows nystagmus that is end-gaze with a horizontal and rotatory component most prominent when looking to the right. There is no positional component to her vertigo. The patient will be sent for MRI evaluation of the brain, and a carotid Doppler study. The patient will followup if needed, but if the events become more frequent, medical therapy may be needed. The clinical examination today otherwise was unremarkable.  Marlan Palau. Keith Claudette Wermuth MD 05/24/2013 7:37 PM  [facility] [address] [place], [place] [id number]  Phone [phone] Fax [phone]

## 2013-05-24 NOTE — Patient Instructions (Signed)
Vertigo Vertigo means you feel like you or your surroundings are moving when they are not. Vertigo can be dangerous if it occurs when you are at work, driving, or performing difficult activities.  CAUSES  Vertigo occurs when there is a conflict of signals sent to your brain from the visual and sensory systems in your body. There are many different causes of vertigo, including:  Infections, especially in the inner ear.  A bad reaction to a drug or misuse of alcohol and medicines.  Withdrawal from drugs or alcohol.  Rapidly changing positions, such as lying down or rolling over in bed.  A migraine headache.  Decreased blood flow to the brain.  Increased pressure in the brain from a head injury, infection, tumor, or bleeding. SYMPTOMS  You may feel as though the world is spinning around or you are falling to the ground. Because your balance is upset, vertigo can cause nausea and vomiting. You may have involuntary eye movements (nystagmus). DIAGNOSIS  Vertigo is usually diagnosed by physical exam. If the cause of your vertigo is unknown, your caregiver may perform imaging tests, such as an MRI scan (magnetic resonance imaging). TREATMENT  Most cases of vertigo resolve on their own, without treatment. Depending on the cause, your caregiver may prescribe certain medicines. If your vertigo is related to body position issues, your caregiver may recommend movements or procedures to correct the problem. In rare cases, if your vertigo is caused by certain inner ear problems, you may need surgery. HOME CARE INSTRUCTIONS   Follow your caregiver's instructions.  Avoid driving.  Avoid operating heavy machinery.  Avoid performing any tasks that would be dangerous to you or others during a vertigo episode.  Tell your caregiver if you notice that certain medicines seem to be causing your vertigo. Some of the medicines used to treat vertigo episodes can actually make them worse in some people. SEEK  IMMEDIATE MEDICAL CARE IF:   Your medicines do not relieve your vertigo or are making it worse.  You develop problems with talking, walking, weakness, or using your arms, hands, or legs.  You develop severe headaches.  Your nausea or vomiting continues or gets worse.  You develop visual changes.  A family member notices behavioral changes.  Your condition gets worse. MAKE SURE YOU:  Understand these instructions.  Will watch your condition.  Will get help right away if you are not doing well or get worse. Document Released: 12/11/2004 Document Revised: 05/26/2011 Document Reviewed: 09/19/2010 Exit[employer] Patient Information 2014 [employer].  

## 2013-05-31 ENCOUNTER — Ambulatory Visit (INDEPENDENT_AMBULATORY_CARE_PROVIDER_SITE_OTHER): Payer: Medicare Other

## 2013-05-31 DIAGNOSIS — R42 Dizziness and giddiness: Secondary | ICD-10-CM

## 2013-06-03 ENCOUNTER — Telehealth: Payer: Self-pay | Admitting: Neurology

## 2013-06-03 NOTE — Telephone Encounter (Signed)
I called patient. A carotid Doppler study done was unremarkable. No significant stenosis seen. The patient will be having the MRI the brain done within the next several days. I'll call her when I have the results.

## 2013-06-07 ENCOUNTER — Ambulatory Visit
Admission: RE | Admit: 2013-06-07 | Discharge: 2013-06-07 | Disposition: A | Discharge status: Home / Self Care | Payer: Medicare Other | Source: Ambulatory Visit | Attending: Neurology | Admitting: Neurology

## 2013-06-07 DIAGNOSIS — R42 Dizziness and giddiness: Secondary | ICD-10-CM

## 2013-06-08 ENCOUNTER — Telehealth: Payer: Self-pay | Admitting: Neurology

## 2013-06-08 NOTE — Telephone Encounter (Signed)
I called patient. The MRI study of the brain shows mild small vessel changes, slightly progressed from 2011. Would not expect that the degree of small vessel changes would result in any significant clinical symptoms. The patient is to contact our office if the symptoms worsen, and we will treat symptomatically.   MRI brain 06/08/2013:  Impression   Abnormal MRI brain (without) demonstrating: 1. Moderate periventricular, subcortical, juxtacortical and cerebellar  chronic small vessel ischemic disease. 2. No acute findings. 3. Mild progression of white matter disease compared to MRI on 03/20/09.

## 2013-08-09 ENCOUNTER — Encounter (HOSPITAL_COMMUNITY): Payer: Self-pay | Admitting: Emergency Medicine

## 2013-08-09 ENCOUNTER — Emergency Department (HOSPITAL_COMMUNITY): Payer: Medicare Other

## 2013-08-09 ENCOUNTER — Observation Stay (HOSPITAL_COMMUNITY)
Admission: EM | Admit: 2013-08-09 | Discharge: 2013-08-09 | Disposition: A | Discharge status: Home / Self Care | Payer: Medicare Other | Attending: Internal Medicine | Admitting: Internal Medicine

## 2013-08-09 DIAGNOSIS — K219 Gastro-esophageal reflux disease without esophagitis: Secondary | ICD-10-CM

## 2013-08-09 DIAGNOSIS — D649 Anemia, unspecified: Secondary | ICD-10-CM

## 2013-08-09 DIAGNOSIS — K31819 Angiodysplasia of stomach and duodenum without bleeding: Secondary | ICD-10-CM

## 2013-08-09 DIAGNOSIS — E785 Hyperlipidemia, unspecified: Secondary | ICD-10-CM | POA: Insufficient documentation

## 2013-08-09 DIAGNOSIS — I2510 Atherosclerotic heart disease of native coronary artery without angina pectoris: Secondary | ICD-10-CM | POA: Insufficient documentation

## 2013-08-09 DIAGNOSIS — M3500 Sicca syndrome, unspecified: Secondary | ICD-10-CM | POA: Insufficient documentation

## 2013-08-09 DIAGNOSIS — M1990 Unspecified osteoarthritis, unspecified site: Secondary | ICD-10-CM | POA: Insufficient documentation

## 2013-08-09 DIAGNOSIS — Z9861 Coronary angioplasty status: Secondary | ICD-10-CM

## 2013-08-09 DIAGNOSIS — D509 Iron deficiency anemia, unspecified: Secondary | ICD-10-CM | POA: Diagnosis present

## 2013-08-09 DIAGNOSIS — I498 Other specified cardiac arrhythmias: Secondary | ICD-10-CM | POA: Insufficient documentation

## 2013-08-09 DIAGNOSIS — I10 Essential (primary) hypertension: Secondary | ICD-10-CM | POA: Insufficient documentation

## 2013-08-09 DIAGNOSIS — I7300 Raynaud's syndrome without gangrene: Secondary | ICD-10-CM | POA: Insufficient documentation

## 2013-08-09 DIAGNOSIS — L719 Rosacea, unspecified: Secondary | ICD-10-CM

## 2013-08-09 DIAGNOSIS — D500 Iron deficiency anemia secondary to blood loss (chronic): Secondary | ICD-10-CM

## 2013-08-09 DIAGNOSIS — R079 Chest pain, unspecified: Principal | ICD-10-CM | POA: Insufficient documentation

## 2013-08-09 DIAGNOSIS — I4510 Unspecified right bundle-branch block: Secondary | ICD-10-CM | POA: Insufficient documentation

## 2013-08-09 DIAGNOSIS — R0789 Other chest pain: Secondary | ICD-10-CM

## 2013-08-09 LAB — CBC WITH DIFFERENTIAL/PLATELET
Basophils Absolute: 0 10*3/uL (ref 0.0–0.1)
Basophils Relative: 0 % (ref 0–1)
EOS ABS: 0.1 10*3/uL (ref 0.0–0.7)
EOS PCT: 1 % (ref 0–5)
HCT: 34.7 % — ABNORMAL LOW (ref 36.0–46.0)
Hemoglobin: 11.7 g/dL — ABNORMAL LOW (ref 12.0–15.0)
LYMPHS ABS: 1.2 10*3/uL (ref 0.7–4.0)
Lymphocytes Relative: 21 % (ref 12–46)
MCH: 28.5 pg (ref 26.0–34.0)
MCHC: 33.7 g/dL (ref 30.0–36.0)
MCV: 84.6 fL (ref 78.0–100.0)
Monocytes Absolute: 0.7 10*3/uL (ref 0.1–1.0)
Monocytes Relative: 11 % (ref 3–12)
Neutro Abs: 3.8 10*3/uL (ref 1.7–7.7)
Neutrophils Relative %: 67 % (ref 43–77)
PLATELETS: 169 10*3/uL (ref 150–400)
RBC: 4.1 MIL/uL (ref 3.87–5.11)
RDW: 15.3 % (ref 11.5–15.5)
WBC: 5.8 10*3/uL (ref 4.0–10.5)

## 2013-08-09 LAB — BASIC METABOLIC PANEL
BUN: 15 mg/dL (ref 6–23)
CALCIUM: 9.9 mg/dL (ref 8.4–10.5)
CO2: 21 meq/L (ref 19–32)
CREATININE: 0.91 mg/dL (ref 0.50–1.10)
Chloride: 103 mEq/L (ref 96–112)
GFR calc Af Amer: 70 mL/min — ABNORMAL LOW (ref >90)
GFR calc non Af Amer: 60 mL/min — ABNORMAL LOW (ref >90)
GLUCOSE: 82 mg/dL (ref 70–99)
Potassium: 4.5 mEq/L (ref 3.7–5.3)
Sodium: 138 mEq/L (ref 137–147)

## 2013-08-09 LAB — TROPONIN I: Troponin I: <0.3 ng/mL (ref <0.30)

## 2013-08-09 LAB — I-STAT TROPONIN, ED: TROPONIN I, POC: 0 ng/mL (ref 0.00–0.08)

## 2013-08-09 MED ORDER — ASPIRIN 81 MG PO CHEW
324.0000 mg | CHEWABLE_TABLET | Freq: Once | ORAL | Status: AC
  Administered 2013-08-09: 324 mg via ORAL
  Filled 2013-08-09: qty 4

## 2013-08-09 MED ORDER — NITROGLYCERIN 0.4 MG SL SUBL: 0.4000 mg | SUBLINGUAL_TABLET | SUBLINGUAL | Status: DC | PRN

## 2013-08-09 NOTE — ED Provider Notes (Signed)
5:13 PM I was asked to come to the bedside as the patient wants to leave AGAINST MEDICAL ADVICE. The patient does not think she can pay for an observation admission. She states if she had nitroglycerin at home she feels like she may not even had to come in. I discussed that there was concern about her chest pain given her CAD and that it would be best to watch her and run more tests. She understands this but still wants to leave. I discussed the risks of leaving, including myocardial infarction, disability, and death. The patient understands these risks and will call her cardiologist nursing in the morning to set up close followup. She feels like her nitroglycerin is expired, I will refill this prescription. I advised her to return to the hospital if any symptoms return or any other concerning symptoms arise. Her husband was present for all of this as well. The nurse was present for this conversation as well. The patient is alert, awake, and has medical decision-making capacity and is fit to leave at this time. She had another troponin drawn while she was waiting for admission and this was also negative.  Audree Camel, MD 08/09/13 [phone]

## 2013-08-09 NOTE — Progress Notes (Signed)
ED CM received call to consult with patient regarding admission status.  Pt presented to [facility] ED with CP PMH CAD, HLD, RBB, SVT, Sjogren's syndrome, GERD, and episodic vertigo here with chest tightness since 11AM today that came on after walking up stairs, was under her breast and moved into left shoulder with sudden total numbness from elbow to shoulder of left arm that frightened her.In ED Vitals normal , POC troponin negative, and EKG unremarkable with anterior lead nonspecific t wave abnormalities.  Plan was for observation for chest pain work up. Symptoms resolved after nitro. Met with patient and husband at bedside. Pt states, that if she cannot afford to pay for this hospitalization if Dr. Dema Severin is not the admitted MD.  Explained the  Observation vs Inpatient status criteria, with [facility] and how Obs are reimbursed at a lower rate than inpatient stays. Pt verbalized understanding and teach back was done. Brochure on "Understanding Observation and Inpatient Status"  Patient decided that she cannot afford another hospital bill, so she will sign out  AMA.  She denies having chest pain presently. Explained that patient will be leaving against medical advice. Patient states, she  is aware and assumes full responsibility. Discussed patient's decision with Dr. Morton Amy, Dr. Candiss Norse and [facility].  Dr. Morton Amy at bedside to speak with patient. No further ED CM needs identified.

## 2013-08-09 NOTE — ED Provider Notes (Signed)
CSN: [id number]     Arrival date & time 08/09/13  1337 History   First MD Initiated Contact with Patient 08/09/13 1402     Chief Complaint  Patient presents with  . Chest Pain    HPI  78 y.o. female with h/o CAD, HLD, RBB, SVT, Sjogren's syndrome, GERD, and episodic vertigo here with chest tightness since 11AM today that came on after walking up stairs, was under her breast and moved into left shoulder with sudden total numbness from elbow to shoulder of left arm that frightened her. She also endorses mild nausea but denies dyspnea, leg swelling, prolonged sitting, fever, chills, abdominal pain, emesis, or diarrhea. She denies change in medications other than starting some vitamins and rose water. She also reports minimal intake of caffeine and alcohol, and no NSAIDs. She has had increase in stress with daughter being diagnosed with cancer and pt very busy at work. Had improvement in chest tightness with nitro and now is asymptomatic. She also reports bilateral leg numbness for "quite a while" that comes and goes.  Saw cardiology 09/2012 for CAD, native vessel. Intolerant to antiplt or lipid lowering drugs with plan to f/u in 1 year.  Past Medical History  Diagnosis Date  . DYSLIPIDEMIA   . IRON DEFICIENCY ANEMIA SECONDARY TO BLOOD LOSS   . CORONARY ARTERY DISEASE   . Right bundle branch block   . SUPRAVENTRICULAR TACHYCARDIA   . Raynaud's syndrome   . EXTERNAL HEMORRHOIDS   . GERD   . GASTRIC ANTRAL VASCULAR ECTASIA   . ENDOMETRIOSIS   . ACNE ROSACEA   . SJOGREN'S SYNDROME   . OSTEOARTHRITIS   . Rosacea, acne   . COLONIC POLYPS, ADENOMATOUS, HX OF   . HEADACHE, CHRONIC, HX OF    Past Surgical History  Procedure Laterality Date  . Cholecystectomy    . Bilateral oophorectomy    . Breast lumpectomy      left  . Hiatal hernia repair  2005    and paraesophageal  . Esophagogastroduodenoscopy  11/27/2009    APC tretment of lesions  . Colonoscopy w/ biopsies and polypectomy   12/17/2006    adenomatous polyps, external hemorrhoids  . Tear duct surgery      right  . Total abdominal hysterectomy     Family History  Problem Relation Age of Onset  . Heart failure Mother   . Diabetes Mother   . Kidney failure Mother   . Hypertension Mother   . Coronary artery disease Mother   . Breast cancer Sister     x 3 sisters  . Heart disease Father   . Hypertension Father   . Coronary artery disease Father   . Breast cancer Daughter   . Colon cancer Neg Hx   . Esophageal cancer Neg Hx   . Stomach cancer Neg Hx   . Rectal cancer Neg Hx   . Coronary artery disease Brother   . Coronary artery disease Brother   . Coronary artery disease Brother   . Breast cancer Sister   . Breast cancer Sister    History  Substance Use Topics  . Smoking status: Never Smoker   . Smokeless tobacco: Never Used  . Alcohol Use: Yes     Comment: occasional// 1 drink a month   OB History   Grav Para Term Preterm Abortions TAB SAB Ect Mult Living                 Review of Systems  All other  systems reviewed and are negative.   Allergies  Ezetimibe; Ezetimibe-simvastatin; Morphine; Rosuvastatin; Ambien; Plaquenil; Polytrim; and Prevacid  Home Medications   Prior to Admission medications   Medication Sig Start Date End Date Taking? Authorizing Provider  amLODipine (NORVASC) 5 MG tablet Take 5 mg by mouth daily.      Historical Provider, MD  calcium citrate-vitamin D (CITRACAL+D) 315-200 MG-UNIT per tablet Take 1 tablet by mouth daily.    Historical Provider, MD  Capsicum, Cayenne, (CAYENNE PO) Take by mouth daily.    Historical Provider, MD  Cholecalciferol (VITAMIN D3) 2000 UNITS TABS Take 1 capsule by mouth daily.    Historical Provider, MD  Coenzyme Q10 (COQ-10 PO) Take by mouth daily.    Historical Provider, MD  ferrous sulfate 325 (65 FE) MG tablet Take 1 tablet (325 mg total) by mouth 2 (two) times daily. 12/25/11   Iva Boop, MD  Flaxseed, Linseed, (FLAX SEEDS PO)  Take by mouth daily.    Historical Provider, MD  glucosamine-chondroitin 500-400 MG tablet Take 1 tablet by mouth daily.     Historical Provider, MD  Multiple Vitamin (MULTIVITAMIN) capsule Take 1 capsule by mouth daily.      Historical Provider, MD  nitroGLYCERIN (NITROSTAT) 0.4 MG SL tablet Place 0.4 mg under the tongue every 5 (five) minutes as needed for chest pain.    Historical Provider, MD  Nutritional Supplements (GRAPESEED EXTRACT PO) Take by mouth daily.    Historical Provider, MD  PATADAY 0.2 % SOLN Place 1 drop into both eyes daily. 05/12/13   Historical Provider, MD  TURMERIC CURCUMIN PO Take by mouth daily.    Historical Provider, MD   BP 113/52  Pulse 67  Temp(Src) 97.7 F (36.5 C) (Oral)  Resp 12  Ht 5\' 3"  (1.6 m)  Wt 133 lb (60.328 kg)  BMI 23.57 kg/m2  SpO2 100% Physical Exam GEN: NAD, pleasant HEENT: Atraumatic, normocephalic, neck supple, EOMI, sclera clear, PERRL  CV: RRR, II/VI systolic murmur with no transmission to carotids, 2+ bilateral PT pulse. PULM: CTAB, normal effort CHEST: Mild left axillary reproduced tightness, no erythema, swelling, rash, or deformity. ABD: Soft, nontender, nondistended, NABS, no organomegaly SKIN: No rash or cyanosis; warm and well-perfused EXTR: No lower extremity edema or calf tenderness, no erythema, 5/5 bilateral UE and LE (ankle dorsiflexion and plantar flexion). No left arm skin abnormalities or deformity. PSYCH: Mood and affect euthymic, normal rate and volume of speech NEURO: Awake, alert, no focal deficits grossly, normal speech, CN 2-12 tested and intact except mild decreased sensation left forehead and cheek compared to right, normal speech; bilateral LE sensation intact.  ED Course  Procedures (including critical care time) Labs Review Labs Reviewed  BASIC METABOLIC PANEL - Abnormal; Notable for the following:    GFR calc non Af Amer 60 (*)    GFR calc Af Amer 70 (*)    All other components within normal limits  CBC  WITH DIFFERENTIAL - Abnormal; Notable for the following:    Hemoglobin 11.7 (*)    HCT 34.7 (*)    All other components within normal limits  TROPONIN I  TROPONIN I  TROPONIN I  I-STAT TROPOININ, ED    Imaging Review Dg Chest Portable 1 View  08/09/2013   CLINICAL DATA:  Chest pain.  EXAM: PORTABLE CHEST - 1 VIEW  COMPARISON:  None.  FINDINGS: Mediastinum hilar structures are normal. Mild basilar atelectasis. No pleural effusion pneumothorax. Borderline cardiomegaly, normal pulmonary vascularity. Degenerative changes both shoulders and  thoracic spine.  IMPRESSION: 1. Borderline cardiomegaly. 2. Mild basilar atelectasis.   Electronically Signed   By: Maisie Fushomas  Register   On: 08/09/2013 14:10     EKG Interpretation   Date/Time:  Tuesday Aug 09 2013 13:45:14 EDT Ventricular Rate:  76 PR Interval:  138 QRS Duration: 79 QT Interval:  399 QTC Calculation: 449 R Axis:   85 Text Interpretation:  Sinus rhythm Consider left atrial enlargement  Borderline right axis deviation Borderline T abnormalities, anterior leads  Confirmed by Bebe ShaggyWICKLINE  MD, Dorinda HillNALD ([id number]) on 08/09/2013 1:57:00 PM     MDM   Final diagnoses:  Chest tightness  Angiodysplasia of stomach and duodenum (without mention of hemorrhage)  Chest pain  Coronary atherosclerosis of unspecified type of vessel, native or graft  Esophageal reflux  Iron deficiency anemia secondary to blood loss (chronic)  Raynaud's syndrome  Rosacea    78 y.o. female with h/o CAD, HLD, RBB, SVT, Sjogren's syndrome, GERD, and episodic vertigo here with chest tightness and left arm numbness after walking up stairs this morning and relieved with nitroglycerin. Vitals normal in ED, POC troponin negative, and EKG unremarkable with anterior lead nonspecific t wave abnormalities. Will plan for admission for chest pain work up. Symptoms resolved after nitro. Dissection unlikely given normal BP and well-appearing.  - BMET pending. - CBC unremarkable - 2  view CXR with borderline cardiomegaly and mild basilar atelectasis - Dosing aspirin 324mg  x 1. - [facility] cardi[facility] team who will consult. - Favor admission for workup. Spoke with admitting team who is accepting pt.  Murmur - per patient, this was there chronically but has not been mentioned in 30 years. - Consider echocardiogram.  Leona SingletonMaria T Isabellarose Kope, MD PGY-2, [facility]   Leona SingletonMaria T Santonio Speakman, MD 08/09/13 [phone]

## 2013-08-09 NOTE — H&P (Signed)
Patient Demographics  [name], is a 78 y.o. female  MRN: [id number]   DOB - [birth year]  Admit Date - 08/09/2013  Outpatient Primary MD for the patient is Cala Bradford, MD   With History of -  Past Medical History  Diagnosis Date  . DYSLIPIDEMIA   . IRON DEFICIENCY ANEMIA SECONDARY TO BLOOD LOSS   . CORONARY ARTERY DISEASE   . Right bundle branch block   . SUPRAVENTRICULAR TACHYCARDIA   . Raynaud's syndrome   . EXTERNAL HEMORRHOIDS   . GERD   . GASTRIC ANTRAL VASCULAR ECTASIA   . ENDOMETRIOSIS   . ACNE ROSACEA   . SJOGREN'S SYNDROME   . OSTEOARTHRITIS   . Rosacea, acne   . COLONIC POLYPS, ADENOMATOUS, HX OF   . HEADACHE, CHRONIC, HX OF       Past Surgical History  Procedure Laterality Date  . Cholecystectomy    . Bilateral oophorectomy    . Breast lumpectomy      left  . Hiatal hernia repair  2005    and paraesophageal  . Esophagogastroduodenoscopy  11/27/2009    APC tretment of lesions  . Colonoscopy w/ biopsies and polypectomy  12/17/2006    adenomatous polyps, external hemorrhoids  . Tear duct surgery      right  . Total abdominal hysterectomy      in for   Chief Complaint  Patient presents with  . Chest Pain     HPI  [name]  is a 78 y.o. female,  with history of CAD with no stents are CABG in the past follows with Dr. Excell Seltzer, hypertension, GERD, Sjogren's syndrome and Raynaud's phenomenon, dyslipidemia who comes in with chief complaints of chest tightness which happened this afternoon while she was driving, she describes this as substernal chest pressure radiating to her left arm associated with mild dizziness and nausea, no aggravating factors but relieved with nitroglycerin which she got when she arrived at primary care physician's office, she was then sent to ER where  initial workup including chest x-ray EKG and lab work was unremarkable. She is now chest pain-free and I was called to admit the patient for chest pain workup.    Review of Systems  currently negative  In addition to the HPI above,   No Fever-chills, No Headache, No changes with Vision or hearing, No problems swallowing food or Liquids, No Chest pain, Cough or Shortness of Breath, No Abdominal pain, No Nausea or Vommitting, Bowel movements are regular, No Blood in stool or Urine, No dysuria, No new skin rashes or bruises, No new joints pains-aches,  No new weakness, tingling, numbness in any extremity, No recent weight gain or loss, No polyuria, polydypsia or polyphagia, No significant Mental Stressors.  A full 10 point Review of Systems was done, except as stated above, all other Review of Systems were negative.   Social History History  Substance Use Topics  . Smoking status: Never Smoker   .  Smokeless tobacco: Never Used  . Alcohol Use: Yes     Comment: occasional// 1 drink a month      Family History Family History  Problem Relation Age of Onset  . Heart failure Mother   . Diabetes Mother   . Kidney failure Mother   . Hypertension Mother   . Coronary artery disease Mother   . Breast cancer Sister     x 3 sisters  . Heart disease Father   . Hypertension Father   . Coronary artery disease Father   . Breast cancer Daughter   . Colon cancer Neg Hx   . Esophageal cancer Neg Hx   . Stomach cancer Neg Hx   . Rectal cancer Neg Hx   . Coronary artery disease Brother   . Coronary artery disease Brother   . Coronary artery disease Brother   . Breast cancer Sister   . Breast cancer Sister       Prior to Admission medications   Medication Sig Start Date End Date Taking? Authorizing Provider  amLODipine (NORVASC) 5 MG tablet Take 5 mg by mouth daily.    Yes Historical Provider, MD  Biotin 10 MG CAPS Take 10 mg by mouth daily.   Yes Historical Provider, MD    calcium citrate-vitamin D (CITRACAL+D) 315-200 MG-UNIT per tablet Take 1 tablet by mouth daily.   Yes Historical Provider, MD  Cholecalciferol (VITAMIN D3) 2000 UNITS TABS Take 1 capsule by mouth daily.   Yes Historical Provider, MD  Evening Primrose Oil 500 MG CAPS Take 500 mg by mouth daily.   Yes Historical Provider, MD  ferrous sulfate 325 (65 FE) MG tablet Take 325 mg by mouth daily with breakfast.   Yes Historical Provider, MD  Flaxseed, Linseed, (FLAX SEEDS PO) Take 1 tablet by mouth daily.    Yes Historical Provider, MD  Ginger, Zingiber officinalis, (GINGER ROOT) 250 MG CAPS Take 250 mg by mouth daily.   Yes Historical Provider, MD  glucosamine-chondroitin 500-400 MG tablet Take 1 tablet by mouth daily.    Yes Historical Provider, MD  ketotifen (REFRESH EYE ITCH RELIEF) 0.025 % ophthalmic solution Place 2 drops into both eyes 2 (two) times daily as needed (for dry eyes).   Yes Historical Provider, MD  Multiple Vitamin (MULTIVITAMIN) capsule Take 1 capsule by mouth daily.    Yes Historical Provider, MD  nitroGLYCERIN (NITROSTAT) 0.4 MG SL tablet Place 0.4 mg under the tongue every 5 (five) minutes as needed for chest pain.   Yes Historical Provider, MD  Nutritional Supplements (GRAPESEED EXTRACT PO) Take 1 tablet by mouth daily.    Yes Historical Provider, MD  PATADAY 0.2 % SOLN Place 1 drop into both eyes daily as needed (for dry eyes).  05/12/13  Yes Historical Provider, MD  TURMERIC CURCUMIN PO Take 1 tablet by mouth daily.    Yes Historical Provider, MD  Capsicum, Cayenne, (CAYENNE PO) Take 1 capsule by mouth daily as needed (for stomach).     Historical Provider, MD  Coenzyme Q10 (COQ-10 PO) Take 1 tablet by mouth daily as needed (for vitamin).     Historical Provider, MD    Allergies  Allergen Reactions  . Ezetimibe     Muscle pain= zetia  . Ezetimibe-Simvastatin     Muscle pain= vytorin  . Morphine     nightmares  . Rosuvastatin     Muscle pain  . Ambien [Zolpidem Tartrate]  Other (See Comments)    Fell down  . Plaquenil [Hydroxychloroquine  Sulfate] Other (See Comments)  . Polytrim [Polymyxin B-Trimethoprim] Other (See Comments)    unknown  . Prevacid [Lansoprazole] Other (See Comments)    dizziness    Physical Exam  Vitals  Blood pressure 113/52, pulse 67, temperature 97.7 F (36.5 C), temperature source Oral, resp. rate 12, height 5\' 3"  (1.6 m), weight 60.328 kg (133 lb), SpO2 100.00%.   1. General elderly white female lying in bed in NAD,     2. Normal affect and insight, Not Suicidal or Homicidal, Awake Alert, Oriented X 3.  3. No F.N deficits, ALL C.Nerves Intact, Strength 5/5 all 4 extremities, Sensation intact all 4 extremities, Plantars down going.  4. Ears and Eyes appear Normal, Conjunctivae clear, PERRLA. Moist Oral Mucosa.  5. Supple Neck, No JVD, No cervical lymphadenopathy appriciated, No Carotid Bruits.  6. Symmetrical Chest wall movement, Good air movement bilaterally, CTAB.  7. RRR, No Gallops, Rubs or Murmurs, No Parasternal Heave.  8. Positive Bowel Sounds, Abdomen Soft, No tenderness, No organomegaly appriciated,No rebound -guarding or rigidity.  9.  No Cyanosis, Normal Skin Turgor, No Skin Rash or Bruise.  10. Good muscle tone,  joints appear normal , no effusions, Normal ROM.  11. No Palpable Lymph Nodes in Neck or Axillae     Data Review  CBC  Recent Labs Lab 08/09/13 1348  WBC 5.8  HGB 11.7*  HCT 34.7*  PLT 169  MCV 84.6  MCH 28.5  MCHC 33.7  RDW 15.3  LYMPHSABS 1.2  MONOABS 0.7  EOSABS 0.1  BASOSABS 0.0   ------------------------------------------------------------------------------------------------------------------  Chemistries   Recent Labs Lab 08/09/13 1348  NA 138  K 4.5  CL 103  CO2 21  GLUCOSE 82  BUN 15  CREATININE 0.91  CALCIUM 9.9   ------------------------------------------------------------------------------------------------------------------ estimated creatinine  clearance is 44.2 ml/min (by C-G formula based on Cr of 0.91). ------------------------------------------------------------------------------------------------------------------ No results found for this basename: TSH, T4TOTAL, FREET3, T3FREE, THYROIDAB,  in the last 72 hours   Coagulation profile No results found for this basename: INR, PROTIME,  in the last 168 hours ------------------------------------------------------------------------------------------------------------------- No results found for this basename: DDIMER,  in the last 72 hours -------------------------------------------------------------------------------------------------------------------  Cardiac Enzymes No results found for this basename: CK, CKMB, TROPONINI, MYOGLOBIN,  in the last 168 hours ------------------------------------------------------------------------------------------------------------------ No components found with this basename: POCBNP,    ---------------------------------------------------------------------------------------------------------------  Urinalysis    Component Value Date/Time   COLORURINE YELLOW 03/15/2009 1800   APPEARANCEUR CLEAR 03/15/2009 1800   LABSPEC 1.013 03/15/2009 1800   PHURINE 6.0 03/15/2009 1800   GLUCOSEU NEGATIVE 03/15/2009 1800   HGBUR NEGATIVE 03/15/2009 1800   BILIRUBINUR NEGATIVE 03/15/2009 1800   KETONESUR NEGATIVE 03/15/2009 1800   PROTEINUR NEGATIVE 03/15/2009 1800   UROBILINOGEN 0.2 03/15/2009 1800   NITRITE NEGATIVE 03/15/2009 1800   LEUKOCYTESUR SMALL* 03/15/2009 1800    ----------------------------------------------------------------------------------------------------------------  Imaging results:   Dg Chest Portable 1 View  08/09/2013   CLINICAL DATA:  Chest pain.  EXAM: PORTABLE CHEST - 1 VIEW  COMPARISON:  None.  FINDINGS: Mediastinum hilar structures are normal. Mild basilar atelectasis. No pleural effusion pneumothorax. Borderline  cardiomegaly, normal pulmonary vascularity. Degenerative changes both shoulders and thoracic spine.  IMPRESSION: 1. Borderline cardiomegaly. 2. Mild basilar atelectasis.   Electronically Signed   By: Maisie Fus  Register   On: 08/09/2013 14:10    My personal review of EKG: Rhythm NSR, Rate 76 /min, non specific ST changes    Assessment & Plan   1. Chest pain in a patient with history of  CAD. He is currently symptom-free, we'll keep her on a telemetry bed for 23 hour observation, place her on aspirin, nitroglycerin as needed, cycle troponins, n.p.o. after midnight. May need stress test in the morning, will inform cardiology.    2. GERD. No acute issues we'll give her as needed Maalox .    3. Sjogren's disease and Raynaud's syndrome. No acute issues symptom-free outpatient followup with PCP.    4. Hypertension  - stable continue on Norvasc.      DVT Prophylaxis Heparin   AM Labs Ordered, also please review Full Orders  Family Communication: Admission, patients condition and plan of care including tests being ordered have been discussed with the patient   who indicates understanding and agree with the plan and Code Status.  Code Status full  Likely DC to home  Condition Fair  Time spent in minutes 35    Leroy SeaPrashant K Redith Drach M.D on 08/09/2013 at 3:57 PM  Between 7am to 7pm - Pager - [phone]  After 7pm go to [email or web address] - password [email or web address]  And look for the night coverage person covering me after hours  [facility] Office  [phone]   **Disclaimer: This note may have been dictated with voice recognition software. Similar sounding words can inadvertently be transcribed and this note may contain transcription errors which may not have been corrected upon publication of note.**

## 2013-08-09 NOTE — Discharge Summary (Signed)
Patient left AMA from the ER, I was informed of this by the Case manager and EDP Dr Criss AlvineGoldston please see Dr Criss AlvineGoldston note.                                                                                                                                                 5:13 PM I was asked to come to the bedside as the patient wants to leave AGAINST MEDICAL ADVICE. The patient does not think she can pay for an observation admission. She states if she had nitroglycerin at home she feels like she may not even had to come in. I discussed that there was concern about her chest pain given her CAD and that it would be best to watch her and run more tests. She understands this but still wants to leave. I discussed the risks of leaving, including myocardial infarction, disability, and death. The patient understands these risks and will call her cardiologist nursing in the morning to set up close followup. She feels like her nitroglycerin is expired, I will refill this prescription. I advised her to return to the hospital if any symptoms return or any other concerning symptoms arise. Her husband was present for all of this as well. The nurse was present for this conversation as well. The patient is alert, awake, and has medical decision-making capacity and is fit to leave at this time. She had another troponin drawn while she was waiting for admission and this was also negative.    [name] CamelScott T Goldston, MD  08/09/13 1716      My H&P is below                                     Patient Demographics  [name], is a 78 y.o. female  MRN: [id number]   DOB - 10/05/1935  Admit Date - 08/09/2013  Outpatient Primary MD for the patient is Cala BradfordWHITE,CYNTHIA S, MD   With History of -  Past Medical History  Diagnosis Date  . DYSLIPIDEMIA   . IRON DEFICIENCY ANEMIA SECONDARY TO BLOOD LOSS   . CORONARY ARTERY DISEASE   . Right bundle branch block   . SUPRAVENTRICULAR TACHYCARDIA   . Raynaud's syndrome   .  EXTERNAL HEMORRHOIDS   . GERD   . GASTRIC ANTRAL VASCULAR ECTASIA   . ENDOMETRIOSIS   . ACNE ROSACEA   . SJOGREN'S SYNDROME   . OSTEOARTHRITIS   . Rosacea, acne   . COLONIC POLYPS, ADENOMATOUS, HX OF   . HEADACHE, CHRONIC, HX OF       Past Surgical History  Procedure Laterality Date  . Cholecystectomy    . Bilateral oophorectomy    . Breast lumpectomy      left  . Hiatal hernia repair  2005  and paraesophageal  . Esophagogastroduodenoscopy  11/27/2009    APC tretment of lesions  . Colonoscopy w/ biopsies and polypectomy  12/17/2006    adenomatous polyps, external hemorrhoids  . Tear duct surgery      right  . Total abdominal hysterectomy      in for   Chief Complaint  Patient presents with  . Chest Pain     HPI  [name]  is a 78 y.o. female,  with history of CAD with no stents are CABG in the past follows with Dr. Excell Seltzer, hypertension, GERD, Sjogren's syndrome and Raynaud's phenomenon, dyslipidemia who comes in with chief complaints of chest tightness which happened this afternoon while she was driving, she describes this as substernal chest pressure radiating to her left arm associated with mild dizziness and nausea, no aggravating factors but relieved with nitroglycerin which she got when she arrived at primary care physician's office, she was then sent to ER where initial workup including chest x-ray EKG and lab work was unremarkable. She is now chest pain-free and I was called to admit the patient for chest pain workup.    Review of Systems  currently negative  In addition to the HPI above,   No Fever-chills, No Headache, No changes with Vision or hearing, No problems swallowing food or Liquids, No Chest pain, Cough or Shortness of Breath, No Abdominal pain, No Nausea or Vommitting, Bowel movements are regular, No Blood in stool or Urine, No dysuria, No new skin rashes or bruises, No new joints pains-aches,  No new weakness, tingling, numbness in any  extremity, No recent weight gain or loss, No polyuria, polydypsia or polyphagia, No significant Mental Stressors.  A full 10 point Review of Systems was done, except as stated above, all other Review of Systems were negative.   Social History History  Substance Use Topics  . Smoking status: Never Smoker   . Smokeless tobacco: Never Used  . Alcohol Use: Yes     Comment: occasional// 1 drink a month      Family History Family History  Problem Relation Age of Onset  . Heart failure Mother   . Diabetes Mother   . Kidney failure Mother   . Hypertension Mother   . Coronary artery disease Mother   . Breast cancer Sister     x 3 sisters  . Heart disease Father   . Hypertension Father   . Coronary artery disease Father   . Breast cancer Daughter   . Colon cancer Neg Hx   . Esophageal cancer Neg Hx   . Stomach cancer Neg Hx   . Rectal cancer Neg Hx   . Coronary artery disease Brother   . Coronary artery disease Brother   . Coronary artery disease Brother   . Breast cancer Sister   . Breast cancer Sister       Prior to Admission medications   Medication Sig Start Date End Date Taking? Authorizing Provider  amLODipine (NORVASC) 5 MG tablet Take 5 mg by mouth daily.    Yes Historical Provider, MD  Biotin 10 MG CAPS Take 10 mg by mouth daily.   Yes Historical Provider, MD  calcium citrate-vitamin D (CITRACAL+D) 315-200 MG-UNIT per tablet Take 1 tablet by mouth daily.   Yes Historical Provider, MD  Cholecalciferol (VITAMIN D3) 2000 UNITS TABS Take 1 capsule by mouth daily.   Yes Historical Provider, MD  Evening Primrose Oil 500 MG CAPS Take 500 mg by mouth daily.   Yes Historical  Provider, MD  ferrous sulfate 325 (65 FE) MG tablet Take 325 mg by mouth daily with breakfast.   Yes Historical Provider, MD  Flaxseed, Linseed, (FLAX SEEDS PO) Take 1 tablet by mouth daily.    Yes Historical Provider, MD  Ginger, Zingiber officinalis, (GINGER ROOT) 250 MG CAPS Take 250 mg by mouth  daily.   Yes Historical Provider, MD  glucosamine-chondroitin 500-400 MG tablet Take 1 tablet by mouth daily.    Yes Historical Provider, MD  ketotifen (REFRESH EYE ITCH RELIEF) 0.025 % ophthalmic solution Place 2 drops into both eyes 2 (two) times daily as needed (for dry eyes).   Yes Historical Provider, MD  Multiple Vitamin (MULTIVITAMIN) capsule Take 1 capsule by mouth daily.    Yes Historical Provider, MD  nitroGLYCERIN (NITROSTAT) 0.4 MG SL tablet Place 0.4 mg under the tongue every 5 (five) minutes as needed for chest pain.   Yes Historical Provider, MD  Nutritional Supplements (GRAPESEED EXTRACT PO) Take 1 tablet by mouth daily.    Yes Historical Provider, MD  PATADAY 0.2 % SOLN Place 1 drop into both eyes daily as needed (for dry eyes).  05/12/13  Yes Historical Provider, MD  TURMERIC CURCUMIN PO Take 1 tablet by mouth daily.    Yes Historical Provider, MD  Capsicum, Cayenne, (CAYENNE PO) Take 1 capsule by mouth daily as needed (for stomach).     Historical Provider, MD  Coenzyme Q10 (COQ-10 PO) Take 1 tablet by mouth daily as needed (for vitamin).     Historical Provider, MD    Allergies  Allergen Reactions  . Ezetimibe     Muscle pain= zetia  . Ezetimibe-Simvastatin     Muscle pain= vytorin  . Morphine     nightmares  . Rosuvastatin     Muscle pain  . Ambien [Zolpidem Tartrate] Other (See Comments)    Fell down  . Plaquenil [Hydroxychloroquine Sulfate] Other (See Comments)  . Polytrim [Polymyxin B-Trimethoprim] Other (See Comments)    unknown  . Prevacid [Lansoprazole] Other (See Comments)    dizziness    Physical Exam  Vitals  Blood pressure 122/63, pulse 70, temperature 97.6 F (36.4 C), temperature source Oral, resp. rate 11, height 5\' 3"  (1.6 m), weight 60.328 kg (133 lb), SpO2 100.00%.   1. General elderly white female lying in bed in NAD,     2. Normal affect and insight, Not Suicidal or Homicidal, Awake Alert, Oriented X 3.  3. No F.N deficits, ALL C.Nerves  Intact, Strength 5/5 all 4 extremities, Sensation intact all 4 extremities, Plantars down going.  4. Ears and Eyes appear Normal, Conjunctivae clear, PERRLA. Moist Oral Mucosa.  5. Supple Neck, No JVD, No cervical lymphadenopathy appriciated, No Carotid Bruits.  6. Symmetrical Chest wall movement, Good air movement bilaterally, CTAB.  7. RRR, No Gallops, Rubs or Murmurs, No Parasternal Heave.  8. Positive Bowel Sounds, Abdomen Soft, No tenderness, No organomegaly appriciated,No rebound -guarding or rigidity.  9.  No Cyanosis, Normal Skin Turgor, No Skin Rash or Bruise.  10. Good muscle tone,  joints appear normal , no effusions, Normal ROM.  11. No Palpable Lymph Nodes in Neck or Axillae     Data Review  CBC  Recent Labs Lab 08/09/13 1348  WBC 5.8  HGB 11.7*  HCT 34.7*  PLT 169  MCV 84.6  MCH 28.5  MCHC 33.7  RDW 15.3  LYMPHSABS 1.2  MONOABS 0.7  EOSABS 0.1  BASOSABS 0.0   ------------------------------------------------------------------------------------------------------------------  Chemistries   Recent Labs  Lab 08/09/13 1348  NA 138  K 4.5  CL 103  CO2 21  GLUCOSE 82  BUN 15  CREATININE 0.91  CALCIUM 9.9   ------------------------------------------------------------------------------------------------------------------ estimated creatinine clearance is 44.2 ml/min (by C-G formula based on Cr of 0.91). ------------------------------------------------------------------------------------------------------------------ No results found for this basename: TSH, T4TOTAL, FREET3, T3FREE, THYROIDAB,  in the last 72 hours   Coagulation profile No results found for this basename: INR, PROTIME,  in the last 168 hours ------------------------------------------------------------------------------------------------------------------- No results found for this basename: DDIMER,  in the last 72  hours -------------------------------------------------------------------------------------------------------------------  Cardiac Enzymes  Recent Labs Lab 08/09/13 1547  TROPONINI <0.30   ------------------------------------------------------------------------------------------------------------------ No components found with this basename: POCBNP,    ---------------------------------------------------------------------------------------------------------------  Urinalysis    Component Value Date/Time   COLORURINE YELLOW 03/15/2009 1800   APPEARANCEUR CLEAR 03/15/2009 1800   LABSPEC 1.013 03/15/2009 1800   PHURINE 6.0 03/15/2009 1800   GLUCOSEU NEGATIVE 03/15/2009 1800   HGBUR NEGATIVE 03/15/2009 1800   BILIRUBINUR NEGATIVE 03/15/2009 1800   KETONESUR NEGATIVE 03/15/2009 1800   PROTEINUR NEGATIVE 03/15/2009 1800   UROBILINOGEN 0.2 03/15/2009 1800   NITRITE NEGATIVE 03/15/2009 1800   LEUKOCYTESUR SMALL* 03/15/2009 1800    ----------------------------------------------------------------------------------------------------------------  Imaging results:   Dg Chest Portable 1 View  08/09/2013   CLINICAL DATA:  Chest pain.  EXAM: PORTABLE CHEST - 1 VIEW  COMPARISON:  None.  FINDINGS: Mediastinum hilar structures are normal. Mild basilar atelectasis. No pleural effusion pneumothorax. Borderline cardiomegaly, normal pulmonary vascularity. Degenerative changes both shoulders and thoracic spine.  IMPRESSION: 1. Borderline cardiomegaly. 2. Mild basilar atelectasis.   Electronically Signed   By: Maisie Fus  Register   On: 08/09/2013 14:10    My personal review of EKG: Rhythm NSR, Rate 76 /min, non specific ST changes    Assessment & Plan   1. Chest pain in a patient with history of CAD. He is currently symptom-free, we'll keep her on a telemetry bed for 23 hour observation, place her on aspirin, nitroglycerin as needed, cycle troponins, n.p.o. after midnight. May need stress test in  the morning, will inform cardiology.    2. GERD. No acute issues we'll give her as needed Maalox .    3. Sjogren's disease and Raynaud's syndrome. No acute issues symptom-free outpatient followup with PCP.    4. Hypertension  - stable continue on Norvasc.      DVT Prophylaxis Heparin   AM Labs Ordered, also please review Full Orders  Family Communication: Admission, patients condition and plan of care including tests being ordered have been discussed with the patient   who indicates understanding and agree with the plan and Code Status.  Code Status full  Likely DC to home  Condition Fair  Time spent in minutes 35    Leroy Sea M.D on 08/09/2013 at 7:39 PM  Between 7am to 7pm - Pager - [phone]  After 7pm go to [email or web address] - password [email or web address]  And look for the night coverage person covering me after hours  [facility] Office  [phone]   **Disclaimer: This note may have been dictated with voice recognition software. Similar sounding words can inadvertently be transcribed and this note may contain transcription errors which may not have been corrected upon publication of note.**

## 2013-08-09 NOTE — ED Notes (Signed)
Pt to department via EMS- pt reports that she started having chest pain about 2 hours ago. States that she went to the [facility], and transferred here for further work up. Pt reports that the pain started in the left chest and down into her left arm. Also reports some numbness and tingling. Reports increased stress. 4mg  Zofran 18g RAC Bp-129/55 Hr-74 RR-16 1 SL nitro with EMS

## 2013-08-09 NOTE — ED Provider Notes (Signed)
Patient seen/examined in the Emergency Department in conjunction with Resident Physician Provider  Patient reports chest pain earlier today, now resolved Exam : awake/alert, no distress, maex4, distal pulses equalx4, no murmurs noted Plan: will admit ASA ordered    Joya Gaskins, MD 08/09/13 [phone]

## 2013-08-10 ENCOUNTER — Telehealth: Payer: Self-pay | Admitting: Cardiovascular Disease

## 2013-08-10 ENCOUNTER — Encounter: Payer: Self-pay | Admitting: Cardiovascular Disease

## 2013-08-10 ENCOUNTER — Ambulatory Visit (INDEPENDENT_AMBULATORY_CARE_PROVIDER_SITE_OTHER): Payer: Medicare Other | Admitting: Cardiovascular Disease

## 2013-08-10 VITALS — BP 120/62 | HR 76 | Ht 63.0 in | Wt 132.0 lb

## 2013-08-10 DIAGNOSIS — R079 Chest pain, unspecified: Secondary | ICD-10-CM

## 2013-08-10 NOTE — Patient Instructions (Signed)
Your physician recommends that you continue on your current medications as directed. Please refer to the Current Medication list given to you today.  Your physician has requested that you have a lexiscan myoview. For further information please visit [email or web address]. Please follow instruction sheet, as given.   Your physician wants you to follow-up in: 1 year You will receive a reminder letter in the mail two months in advance. If you don't receive a letter, please call our office to schedule the follow-up appointment.   

## 2013-08-10 NOTE — Progress Notes (Signed)
HPI:  78 year old woman presenting for evaluation of chest pain. The patient has coronary artery disease and underwent cardiac catheterization in 2006. She had mild to moderate coronary artery disease noted at that time. Medical therapy was recommended. She has preserved left ventricular systolic function. Last echocardiogram in 2012 showed normal LV systolic function with mild to moderate aortic insufficiency and moderate tricuspid regurgitation. She's had chronic iron deficiency anemia and has not been treated with antiplatelet therapy. She's also been intolerant to statin drugs.  She was evaluated in the emergency department yesterday for chest pain. She described pressure in the chest radiating to the left arm and shoulder. Symptoms occurred at rest and lasted approximately one hour. Serial cardiac enzymes were negative. Overnight observation was recommended but the patient declined because of concern about insurance coverage. She called in this morning and we added her onto my schedule.  She's had no chest pain today. She does admit to periodic chest pressure, unrelated to physical exertion. She denies shortness of breath, edema, or palpitations. However, she states "I feel exhausted." She also complains of numbness throughout her left side and also involving the right foot. Also has a history of mild gait unsteadiness but no acute change. No other complaints today.   Outpatient Encounter Prescriptions as of 08/10/2013  Medication Sig  . amLODipine (NORVASC) 5 MG tablet Take 5 mg by mouth daily.   . Biotin 10 MG CAPS Take 10 mg by mouth daily.  . calcium citrate-vitamin D (CITRACAL+D) 315-200 MG-UNIT per tablet Take 1 tablet by mouth daily.  . Capsicum, Cayenne, (CAYENNE PO) Take 1 capsule by mouth daily as needed (for stomach).   . Cholecalciferol (VITAMIN D3) 2000 UNITS TABS Take 1 capsule by mouth daily.  . Coenzyme Q10 (COQ-10 PO) Take 1 tablet by mouth daily as needed (for vitamin).   .  Evening Primrose Oil 500 MG CAPS Take 500 mg by mouth daily.  . ferrous sulfate 325 (65 FE) MG tablet Take 325 mg by mouth daily with breakfast.  . Flaxseed, Linseed, (FLAX SEEDS PO) Take 1 tablet by mouth daily.   . Ginger, Zingiber officinalis, (GINGER ROOT) 250 MG CAPS Take 250 mg by mouth daily.  [name]. glucosamine-chondroitin 500-400 MG tablet Take 1 tablet by mouth daily.   [name]. ketotifen (REFRESH EYE ITCH RELIEF) 0.025 % ophthalmic solution Place 2 drops into both eyes 2 (two) times daily as needed (for dry eyes).  . Multiple Vitamin (MULTIVITAMIN) capsule Take 1 capsule by mouth daily.   . nitroGLYCERIN (NITROSTAT) 0.4 MG SL tablet Place 1 tablet (0.4 mg total) under the tongue every 5 (five) minutes as needed for chest pain.  . Nutritional Supplements (GRAPESEED EXTRACT PO) Take 1 tablet by mouth daily.   [name]. PATADAY 0.2 % SOLN Place 1 drop into both eyes daily as needed (for dry eyes).   . temazepam (RESTORIL) 15 MG capsule Take 15 mg by mouth at bedtime as needed. SLEEP AID  . TURMERIC CURCUMIN PO Take 1 tablet by mouth daily.     Allergies  Allergen Reactions  . Ezetimibe     Muscle pain= zetia  . Ezetimibe-Simvastatin     Muscle pain= vytorin  . Morphine     nightmares  . Rosuvastatin     Muscle pain  . Ambien [Zolpidem Tartrate] Other (See Comments)    Fell down  . Plaquenil [Hydroxychloroquine Sulfate] Other (See Comments)  . Polytrim [Polymyxin B-Trimethoprim] Other (See Comments)    unknown  . Prevacid [Lansoprazole]  Other (See Comments)    dizziness    Past Medical History  Diagnosis Date  . DYSLIPIDEMIA   . IRON DEFICIENCY ANEMIA SECONDARY TO BLOOD LOSS   . CORONARY ARTERY DISEASE   . Right bundle branch block   . SUPRAVENTRICULAR TACHYCARDIA   . Raynaud's syndrome   . EXTERNAL HEMORRHOIDS   . GERD   . GASTRIC ANTRAL VASCULAR ECTASIA   . ENDOMETRIOSIS   . ACNE ROSACEA   . SJOGREN'S SYNDROME   . OSTEOARTHRITIS   . Rosacea, acne   . COLONIC POLYPS, ADENOMATOUS,  HX OF   . HEADACHE, CHRONIC, HX OF    ROS: Negative except as per HPI  BP 120/62  Pulse 76  Ht 5\' 3"  (1.6 m)  Wt 59.875 kg (132 lb)  BMI 23.39 kg/m2  PHYSICAL EXAM: Pt is alert and oriented, NAD HEENT: normal Neck: JVP - normal, carotids 2+= without bruits Lungs: CTA bilaterally CV: RRR with grade 2/6 systolic ejection murmur at the upper sternal border Abd: soft, NT, Positive BS, no hepatomegaly Ext: no C/C/E, distal pulses intact and equal Skin: warm/dry no rash  EKG:  Reviewed from 08/09/2013: Normal sinus rhythm 76 beats per minute, nonspecific ST abnormality.  ASSESSMENT AND PLAN: 1. Chest pain with typical and atypical features. The patient has known CAD with nonobstructive disease noted about 10 years ago by cardiac cath. I have recommended a Lexiscan stress Myoview for further risk stratification. She will otherwise continue on her current medical program. I reviewed all the data obtained in the emergency department yesterday. I will see her back in one year unless her stress test shows significant abnormality. The patient has been unable to take antiplatelet Rx because of chronic anemia.   2. HTN - well-controlled on amlodipine.  Tonny Bollman 08/10/2013 5:17 PM

## 2013-08-10 NOTE — Telephone Encounter (Signed)
I spoke with the pt and today she is complaining of fatigue.  The pt denies chest pain today.  The pt has had issues with left sided numbness from her head to her toes and this has been an on going issue. Yesterday the pt developed a new symptom of left shoulder pain and numbness and this alarmed her so she went to the hospital for evaluation.  I asked the pt if they felt like her symptoms were related to a TIA/CVA and she said no because the pt also had right foot numbness. The pt did leave AMA after her test started to come back normal. The pt said she promised to follow-up with Cardiology and that is why she contacted the office.  I have scheduled the pt to see Dr Excell Seltzer this afternoon for further evaluation.

## 2013-08-10 NOTE — Telephone Encounter (Signed)
New problem   Pt need to speak to nurse concerning she went to the hospital and they wanted to admit her for observation but she left hospital and declined. She had arm numbness yesterday. But declined having any chest pain. She stated she does have little arm numbness at present. Please call pt.

## 2013-08-10 NOTE — ED Provider Notes (Signed)
I have personally seen and examined the patient.  I have discussed the plan of care with the resident.  I have reviewed the documentation on PMH/FH/Soc. History.  I have reviewed the documentation of the resident and agree.  I advised admission due to h/o CAD with history of chest pain.  Pt comfortable/stable and chest pain free during my evaluation.  Pt agreeable with plan  Joya Gaskins, MD 08/10/13 [phone]

## 2013-08-22 ENCOUNTER — Encounter: Payer: Self-pay | Admitting: Cardiology

## 2013-08-29 ENCOUNTER — Ambulatory Visit (HOSPITAL_COMMUNITY): Payer: Medicare Other | Attending: Cardiology | Admitting: Radiology

## 2013-08-29 VITALS — BP 128/70 | Ht 63.0 in | Wt 132.0 lb

## 2013-08-29 DIAGNOSIS — R0602 Shortness of breath: Secondary | ICD-10-CM

## 2013-08-29 DIAGNOSIS — R110 Nausea: Secondary | ICD-10-CM

## 2013-08-29 DIAGNOSIS — R0789 Other chest pain: Secondary | ICD-10-CM

## 2013-08-29 DIAGNOSIS — Z8249 Family history of ischemic heart disease and other diseases of the circulatory system: Secondary | ICD-10-CM | POA: Insufficient documentation

## 2013-08-29 DIAGNOSIS — I2510 Atherosclerotic heart disease of native coronary artery without angina pectoris: Secondary | ICD-10-CM | POA: Insufficient documentation

## 2013-08-29 DIAGNOSIS — R002 Palpitations: Secondary | ICD-10-CM | POA: Insufficient documentation

## 2013-08-29 DIAGNOSIS — I10 Essential (primary) hypertension: Secondary | ICD-10-CM | POA: Insufficient documentation

## 2013-08-29 DIAGNOSIS — R079 Chest pain, unspecified: Secondary | ICD-10-CM | POA: Insufficient documentation

## 2013-08-29 DIAGNOSIS — I4510 Unspecified right bundle-branch block: Secondary | ICD-10-CM | POA: Insufficient documentation

## 2013-08-29 DIAGNOSIS — I498 Other specified cardiac arrhythmias: Secondary | ICD-10-CM | POA: Insufficient documentation

## 2013-08-29 MED ORDER — REGADENOSON 0.4 MG/5ML IV SOLN
0.4000 mg | Freq: Once | INTRAVENOUS | Status: AC
  Administered 2013-08-29: 0.4 mg via INTRAVENOUS

## 2013-08-29 MED ORDER — TECHNETIUM TC 99M SESTAMIBI GENERIC - CARDIOLITE
33.0000 | Freq: Once | INTRAVENOUS | Status: AC | PRN
  Administered 2013-08-29: 33 via INTRAVENOUS

## 2013-08-29 MED ORDER — AMINOPHYLLINE 25 MG/ML IV SOLN
75.0000 mg | Freq: Two times a day (BID) | INTRAVENOUS | Status: DC | PRN
Start: 2013-08-29 — End: 2013-08-29
  Administered 2013-08-29: 75 mg via INTRAVENOUS

## 2013-08-29 MED ORDER — TECHNETIUM TC 99M SESTAMIBI GENERIC - CARDIOLITE
10.8000 | Freq: Once | INTRAVENOUS | Status: AC | PRN
  Administered 2013-08-29: 11 via INTRAVENOUS

## 2013-08-29 NOTE — Progress Notes (Signed)
[facility] SITE 3 NUCLEAR MED [address] [place], [place] [address] [phone]    Cardiology Nuclear Med Study  [name] is a 78 y.o. female     MRN : [id number]     DOB: 1935-04-03  Procedure Date: 08/29/2013  Nuclear Med Background Indication for Stress Test:  Evaluation for Ischemia, and Patient seen in hospital on 08-09-2013 for Chest pain,  Enzymes negative History:  CAD-CATH-N/O CAD mild to moderate, SVT, 03/29/2009 MPI: EF: 78% mild anterior defect; breast attenuation, 2012 ECHO: EF: 55-60% mild to moderate AI  Cardiac Risk Factors: Family History - CAD, Hypertension, Lipids and RBBB  Symptoms:  Chest Pain, Chest Pressure, Fatigue and Palpitations   Nuclear Pre-Procedure Caffeine/Decaff Intake:  None> 12 hrs NPO After: 8:00pm   Lungs:  clear O2 Sat: 98% on room air. IV 0.9% NS with Angio Cath:  22g  IV Site: R Antecubital x 1, tolerated well IV Started by:  Irean Hong, RN  Chest Size (in):  38 Cup Size: D  Height: 5\' 3"  (1.6 m)  Weight:  132 lb (59.875 kg)  BMI:  Body mass index is 23.39 kg/(m^2). Tech Comments:  Patient took Norvasc this am. Irean Hong, RN. Aminophylline given for symptoms. All symptoms were resolved before the patient left. S.Williams EMTP    Nuclear Med Study 1 or 2 day study: 1 day  Stress Test Type:  Eugenie Birks  Reading MD: N/A  Order Authorizing Provider:  Tonny Bollman, MD  Resting Radionuclide: Technetium 82m Sestamibi  Resting Radionuclide Dose: 11.0 mCi   Stress Radionuclide:  Technetium 46m Sestamibi  Stress Radionuclide Dose: 33.0 mCi           Stress Protocol Rest HR: 61 Stress HR: 99  Rest BP: 128/70 Stress BP: 131/63  Exercise Time (min): n/a METS: n/a   Predicted Max HR: 145 bpm % Max HR: 68.28 bpm Rate Pressure Product: 43838   Dose of Adenosine (mg):  n/a Dose of Lexiscan: 0.4 mg  Dose of Atropine (mg): n/a Dose of Dobutamine: n/a mcg/kg/min (at max HR)  Stress Test Technologist: Milana Na,  EMT-P  Nuclear Technologist:  Domenic Polite, CNMT     Rest Procedure:  Myocardial perfusion imaging was performed at rest 45 minutes following the intravenous administration of Technetium 63m Sestamibi. Rest ECG: NSR - Normal EKG  Stress Procedure:  The patient received IV Lexiscan 0.4 mg over 15-seconds.  Technetium 35m Sestamibi injected at 30-seconds. This patient had sob, nausea, and was lt. Headed with the Lexiscan injection. Quantitative spect images were obtained after a 45 minute delay. Stress ECG: No significant change from baseline ECG  QPS Raw Data Images:  Normal; no motion artifact; normal heart/lung ratio. Stress Images:  Normal homogeneous uptake in all areas of the myocardium. Rest Images:  Normal homogeneous uptake in all areas of the myocardium. Subtraction (SDS):  No evidence of ischemia. Transient Ischemic Dilatation (Normal <1.22):  0.83 Lung/Heart Ratio (Normal <0.45):  0.22  Quantitative Gated Spect Images QGS EDV:  73 ml QGS ESV:  23 ml  Impression Exercise Capacity:  Lexiscan with no exercise. BP Response:  Normal blood pressure response. Clinical Symptoms:  No chest pain. ECG Impression:  No significant ST segment change suggestive of ischemia. Comparison with Prior Nuclear Study: No significant change from previous study  Overall Impression:  Normal stress nuclear study.  LV Ejection Fraction: 69%.  LV Wall Motion:  NL LV Function; NL Wall Motion  Cassell Clement MD

## 2013-08-31 ENCOUNTER — Encounter: Payer: Self-pay | Admitting: Cardiovascular Disease

## 2013-08-31 NOTE — Telephone Encounter (Signed)
New message     Returning [name]'s call to get stress test results

## 2013-08-31 NOTE — Telephone Encounter (Signed)
This encounter was created in error - please disregard.

## 2013-10-11 ENCOUNTER — Ambulatory Visit: Payer: Medicare Other | Admitting: Cardiovascular Disease

## 2013-11-01 ENCOUNTER — Other Ambulatory Visit: Payer: Self-pay

## 2013-11-01 DIAGNOSIS — Z1231 Encounter for screening mammogram for malignant neoplasm of breast: Secondary | ICD-10-CM

## 2013-11-10 ENCOUNTER — Ambulatory Visit
Admission: RE | Admit: 2013-11-10 | Discharge: 2013-11-10 | Disposition: A | Discharge status: Home / Self Care | Payer: Medicare Other | Source: Ambulatory Visit

## 2013-11-10 DIAGNOSIS — Z1231 Encounter for screening mammogram for malignant neoplasm of breast: Secondary | ICD-10-CM

## 2013-11-14 ENCOUNTER — Other Ambulatory Visit: Payer: Self-pay | Admitting: Family Medicine

## 2013-11-14 DIAGNOSIS — R928 Other abnormal and inconclusive findings on diagnostic imaging of breast: Secondary | ICD-10-CM

## 2013-11-22 ENCOUNTER — Ambulatory Visit
Admission: RE | Admit: 2013-11-22 | Discharge: 2013-11-22 | Disposition: A | Discharge status: Home / Self Care | Payer: Medicare Other | Source: Ambulatory Visit | Attending: Family Medicine | Admitting: Family Medicine

## 2013-11-22 DIAGNOSIS — R928 Other abnormal and inconclusive findings on diagnostic imaging of breast: Secondary | ICD-10-CM

## 2013-11-29 ENCOUNTER — Other Ambulatory Visit: Payer: Self-pay | Admitting: Family Medicine

## 2013-11-29 DIAGNOSIS — R921 Mammographic calcification found on diagnostic imaging of breast: Secondary | ICD-10-CM

## 2013-11-29 DIAGNOSIS — N6452 Nipple discharge: Secondary | ICD-10-CM

## 2013-12-02 ENCOUNTER — Other Ambulatory Visit: Payer: Self-pay | Admitting: Family Medicine

## 2013-12-02 ENCOUNTER — Other Ambulatory Visit: Payer: Self-pay

## 2013-12-02 DIAGNOSIS — N6452 Nipple discharge: Secondary | ICD-10-CM

## 2013-12-07 ENCOUNTER — Ambulatory Visit
Admission: RE | Admit: 2013-12-07 | Discharge: 2013-12-07 | Disposition: A | Discharge status: Home / Self Care | Payer: Medicare Other | Source: Ambulatory Visit | Attending: Family Medicine | Admitting: Family Medicine

## 2013-12-07 DIAGNOSIS — N6452 Nipple discharge: Secondary | ICD-10-CM

## 2013-12-09 ENCOUNTER — Ambulatory Visit
Admission: RE | Admit: 2013-12-09 | Discharge: 2013-12-09 | Disposition: A | Discharge status: Home / Self Care | Payer: Medicare Other | Source: Ambulatory Visit | Attending: Family Medicine | Admitting: Family Medicine

## 2013-12-09 DIAGNOSIS — R921 Mammographic calcification found on diagnostic imaging of breast: Secondary | ICD-10-CM

## 2013-12-15 ENCOUNTER — Encounter (INDEPENDENT_AMBULATORY_CARE_PROVIDER_SITE_OTHER): Payer: Self-pay | Admitting: General Surgery

## 2013-12-15 ENCOUNTER — Other Ambulatory Visit (INDEPENDENT_AMBULATORY_CARE_PROVIDER_SITE_OTHER): Payer: Self-pay | Admitting: General Surgery

## 2013-12-15 DIAGNOSIS — N6091 Unspecified benign mammary dysplasia of right breast: Secondary | ICD-10-CM

## 2013-12-15 NOTE — Progress Notes (Signed)
Patient ID: [name], female   DOB: 02/03/1936, 78 y.o.   MRN: [id number] [name] 12/15/2013 11:36 AM Location: [facility] Patient #: [id number] DOB: [birth year] Married / Language: Undefined / Race: Undefined Female History of Present Illness [name] Pollack [name]; 12/15/2013 12:14 PM) Patient words: atypical hyperplasia RIGHT breast.  The patient is a 78 [name] old female who presents with a complaint of breast problems.  Note:She is referred by Dr. Britta [name] because of atypical ductal hyperplasia of the right breast. Abnormal microcalcifications were seen in the upper outer quadrant of the right breast on mammogram. Image guided biopsy was performed and pathology was consistent with atypical ductal hyperplasia. She has a very strong family history of breast cancer involving her sisters and daughter. She thinks one of them they have had genetic testing. She is going to research that. She's had a left breast biopsy performed in the past which was benign.  Other Problems [name]  Allergies [name]; 12/15/2013 11:39 AM) Morphine Derivatives Ambien *HYPNOTICS/SEDATIVES/SLEEP DISORDER AGENTS* Prevacid *ULCER DRUGS* Plaquenil *ANTIMALARIALS* Simvastatin *ANTIHYPERLIPIDEMICS*  Medication History [name]; 12/15/2013 11:41 AM) Temazepam (15MG  Capsule, Oral at bedtime) Active. AmLODIPine Besylate (5MG  Tablet, Oral daily) Active. Biotin (10MG  Capsule, Oral daily) Active. Multivitamin Adults 50+ (Oral daily)  Active. Vitamin D3 High Potency (1000UNIT Capsule, Oral daily) Active. Glucosamine Chondroitin Adv (Oral daily) Active.  Social History [name]; 12/15/2013 11:36 AM) Alcohol use Occasional alcohol use. No caffeine use No drug use Tobacco use Never smoker.  Family History [name]; 12/15/2013 11:36 AM) Arthritis Mother. Breast Cancer Daughter, Sister. Cerebrovascular Accident Brother, Family Members In General. Depression Daughter. Diabetes Mellitus Brother, Mother. Heart Disease Brother, Father, Mother. Heart disease in female family member before age 106 Hypertension Father, Mother. Kidney Disease Mother.  Pregnancy / Birth History [name]; 12/15/2013 11:36 AM) Age at menarche 14 years. Age of menopause <45 Gravida 3 Maternal age 45-20 Para 3     Review of Systems [name]; 12/15/2013 11:36 AM) General Present- Appetite Loss, Fatigue and Weight Loss. Not Present- Chills, Fever, Night Sweats and Weight Gain. Skin Not Present- Change in Wart/Mole, Dryness, Hives, Jaundice, New Lesions, Non-Healing Wounds, Rash and Ulcer. HEENT Present- Hearing Loss, Ringing in the Ears and Wears glasses/contact lenses. Not Present- Earache, Hoarseness, Nose Bleed, Oral Ulcers, Seasonal Allergies, Sinus Pain, Sore Throat, Visual Disturbances and Yellow Eyes. Cardiovascular Present- Leg Cramps, Palpitations, Shortness of Breath and Swelling of Extremities. Not Present- Chest Pain, Difficulty Breathing Lying Down and Rapid Heart Rate. Gastrointestinal Present- Hemorrhoids and Indigestion. Not Present- Abdominal Pain, Bloating, Bloody Stool, Change in Bowel Habits, Chronic diarrhea, Constipation, Difficulty Swallowing, Excessive gas, Gets full quickly at meals, Nausea, Rectal Pain and Vomiting. Female Genitourinary Present- Nocturia. Not Present- Frequency, Painful Urination, Pelvic Pain and Urgency. Musculoskeletal Present- Back Pain, Joint Pain and  Muscle Weakness. Not Present- Joint Stiffness, Muscle Pain and Swelling of Extremities. Neurological Present- Tingling. Not Present- Decreased Memory, Fainting, Headaches, Numbness, Seizures, Tremor, Trouble walking and Weakness. Endocrine Present- Hair Changes. Not Present- Cold Intolerance, Excessive Hunger, Heat Intolerance, Hot flashes and New Diabetes.  Vitals [name]; 12/15/2013 11:38 AM) 12/15/2013 11:37 AM Weight: 132.5 lb  Height: 63in Body Surface Area: 1.63 m Body Mass Index: 23.47 kg/m Temp.: 98.32F(Oral)  Pulse: 64 (Regular)  Resp.: 16 (Unlabored)  BP: 118/62 (Sitting, Left Arm, Standard)     Physical Exam [name]([name]; 12/15/2013 12:16 PM)  The physical exam findings are as follows: Note:General: WDWN in NAD. Pleasant and cooperative.  HEENT: [place]/AT, no facial masses  EYES: EOMI, no icterus  NECK: Supple, no obvious mass or thyroid enlargement.  BREASTS: Symmetrical in size. No dominant masses, nipple discharge or suspicious skin lesions.  ABDOMEN: Soft, nontender, nondistended, no masses, no organomegaly, active bowel sounds, no scars, no hernias.  MUSCULOSKELETAL: FROM, good muscle tone, no edema, fingers are purplish and cool  LYMPHATIC: No palpable cervical, supraclavicular, axillary adenopathy.  NEUROLOGIC: Alert and oriented, answers questions appropriately, normal gait and station.  PSYCHIATRIC: Normal mood, affect , and behavior.    Assessment & Plan [name]([name]; 12/15/2013 12:17 PM)  ATYPICAL DUCTAL HYPERPLASIA OF RIGHT BREAST (610.8  N62) Impression: She has a strong family history of breast cancer. Wider excision of the area has been recommended and I agree with that recommendation and discussed this with her. I will need to talk with Dr. Excell [name], her cardiologist, prior to surgery to make sure she doesn't have significant cardiac risk.  Plan: Right breast lumpectomy after radioactive seed localization or  wire localization. This will be scheduled pending Dr. Earmon [name]'s input regarding cardiac risk. I have explained the procedure, risks, and aftercare to her. Risks include but are not limited to bleeding, infection, wound problems, cosmetic deformity, anesthesia. She seems to understand and agrees with the plan.  Avel Peaceodd Ricca Melgarejo, M.D.

## 2013-12-21 ENCOUNTER — Other Ambulatory Visit (INDEPENDENT_AMBULATORY_CARE_PROVIDER_SITE_OTHER): Payer: Self-pay | Admitting: General Surgery

## 2013-12-26 ENCOUNTER — Other Ambulatory Visit (INDEPENDENT_AMBULATORY_CARE_PROVIDER_SITE_OTHER): Payer: Self-pay | Admitting: General Surgery

## 2013-12-26 DIAGNOSIS — N6091 Unspecified benign mammary dysplasia of right breast: Secondary | ICD-10-CM

## 2014-01-18 ENCOUNTER — Encounter (HOSPITAL_BASED_OUTPATIENT_CLINIC_OR_DEPARTMENT_OTHER): Payer: Self-pay | Admitting: *Deleted

## 2014-01-18 NOTE — Progress Notes (Signed)
Pt to come for labs after seeds-very nervous-does not take any antianxiety meds Had chest pains 6/15-saw dr cooper-stress test done-negative-no more pain Has a torn left rc-shoulder-cannot lay on lt side well

## 2014-01-20 ENCOUNTER — Encounter (HOSPITAL_BASED_OUTPATIENT_CLINIC_OR_DEPARTMENT_OTHER)
Admission: RE | Admit: 2014-01-20 | Discharge: 2014-01-20 | Disposition: A | Discharge status: Home / Self Care | Payer: Medicare Other | Source: Ambulatory Visit | Attending: General Surgery | Admitting: General Surgery

## 2014-01-20 DIAGNOSIS — K649 Unspecified hemorrhoids: Secondary | ICD-10-CM | POA: Diagnosis not present

## 2014-01-20 DIAGNOSIS — Z86010 Personal history of colonic polyps: Secondary | ICD-10-CM | POA: Diagnosis not present

## 2014-01-20 DIAGNOSIS — Z803 Family history of malignant neoplasm of breast: Secondary | ICD-10-CM | POA: Diagnosis not present

## 2014-01-20 DIAGNOSIS — I2510 Atherosclerotic heart disease of native coronary artery without angina pectoris: Secondary | ICD-10-CM | POA: Diagnosis not present

## 2014-01-20 DIAGNOSIS — N6091 Unspecified benign mammary dysplasia of right breast: Secondary | ICD-10-CM | POA: Diagnosis present

## 2014-01-20 DIAGNOSIS — D500 Iron deficiency anemia secondary to blood loss (chronic): Secondary | ICD-10-CM | POA: Diagnosis not present

## 2014-01-20 DIAGNOSIS — M1990 Unspecified osteoarthritis, unspecified site: Secondary | ICD-10-CM | POA: Diagnosis not present

## 2014-01-20 DIAGNOSIS — M3500 Sicca syndrome, unspecified: Secondary | ICD-10-CM | POA: Diagnosis not present

## 2014-01-20 DIAGNOSIS — E785 Hyperlipidemia, unspecified: Secondary | ICD-10-CM | POA: Diagnosis not present

## 2014-01-20 DIAGNOSIS — I7300 Raynaud's syndrome without gangrene: Secondary | ICD-10-CM | POA: Diagnosis not present

## 2014-01-20 DIAGNOSIS — Z888 Allergy status to other drugs, medicaments and biological substances status: Secondary | ICD-10-CM | POA: Diagnosis not present

## 2014-01-20 DIAGNOSIS — I471 Supraventricular tachycardia: Secondary | ICD-10-CM | POA: Diagnosis not present

## 2014-01-20 DIAGNOSIS — Z885 Allergy status to narcotic agent status: Secondary | ICD-10-CM | POA: Diagnosis not present

## 2014-01-20 DIAGNOSIS — I10 Essential (primary) hypertension: Secondary | ICD-10-CM | POA: Diagnosis not present

## 2014-01-20 DIAGNOSIS — K219 Gastro-esophageal reflux disease without esophagitis: Secondary | ICD-10-CM | POA: Diagnosis not present

## 2014-01-20 LAB — COMPREHENSIVE METABOLIC PANEL
ALBUMIN: 4.1 g/dL (ref 3.5–5.2)
ALT: 16 U/L (ref 0–35)
AST: 29 U/L (ref 0–37)
Alkaline Phosphatase: 68 U/L (ref 39–117)
Anion gap: 14 (ref 5–15)
BUN: 19 mg/dL (ref 6–23)
CO2: 25 mEq/L (ref 19–32)
Calcium: 10.1 mg/dL (ref 8.4–10.5)
Chloride: 99 mEq/L (ref 96–112)
Creatinine, Ser: 0.99 mg/dL (ref 0.50–1.10)
GFR calc Af Amer: 63 mL/min — ABNORMAL LOW (ref >90)
GFR calc non Af Amer: 54 mL/min — ABNORMAL LOW (ref >90)
GLUCOSE: 95 mg/dL (ref 70–99)
Potassium: 5 mEq/L (ref 3.7–5.3)
SODIUM: 138 meq/L (ref 137–147)
Total Bilirubin: 0.4 mg/dL (ref 0.3–1.2)
Total Protein: 7.6 g/dL (ref 6.0–8.3)

## 2014-01-20 LAB — PROTIME-INR
INR: 0.98 (ref 0.00–1.49)
Prothrombin Time: 13.1 seconds (ref 11.6–15.2)

## 2014-01-23 ENCOUNTER — Ambulatory Visit
Admission: RE | Admit: 2014-01-23 | Discharge: 2014-01-23 | Disposition: A | Discharge status: Home / Self Care | Payer: Medicare Other | Source: Ambulatory Visit | Attending: General Surgery | Admitting: General Surgery

## 2014-01-23 DIAGNOSIS — N6091 Unspecified benign mammary dysplasia of right breast: Secondary | ICD-10-CM

## 2014-01-24 ENCOUNTER — Ambulatory Visit
Admission: RE | Admit: 2014-01-24 | Discharge: 2014-01-24 | Disposition: A | Discharge status: Home / Self Care | Payer: Medicare Other | Source: Ambulatory Visit | Attending: General Surgery | Admitting: General Surgery

## 2014-01-24 ENCOUNTER — Encounter (HOSPITAL_BASED_OUTPATIENT_CLINIC_OR_DEPARTMENT_OTHER): Payer: Self-pay | Admitting: *Deleted

## 2014-01-24 ENCOUNTER — Ambulatory Visit (HOSPITAL_BASED_OUTPATIENT_CLINIC_OR_DEPARTMENT_OTHER): Payer: Medicare Other | Admitting: Anesthesiology

## 2014-01-24 ENCOUNTER — Encounter (HOSPITAL_BASED_OUTPATIENT_CLINIC_OR_DEPARTMENT_OTHER)
Admission: RE | Disposition: A | Discharge status: Home / Self Care | Payer: Self-pay | Source: Ambulatory Visit | Attending: General Surgery

## 2014-01-24 ENCOUNTER — Ambulatory Visit (HOSPITAL_BASED_OUTPATIENT_CLINIC_OR_DEPARTMENT_OTHER)
Admission: RE | Admit: 2014-01-24 | Discharge: 2014-01-24 | Disposition: A | Discharge status: Home / Self Care | Payer: Medicare Other | Source: Ambulatory Visit | Attending: General Surgery | Admitting: General Surgery

## 2014-01-24 DIAGNOSIS — I10 Essential (primary) hypertension: Secondary | ICD-10-CM | POA: Insufficient documentation

## 2014-01-24 DIAGNOSIS — Z86010 Personal history of colonic polyps: Secondary | ICD-10-CM | POA: Insufficient documentation

## 2014-01-24 DIAGNOSIS — E785 Hyperlipidemia, unspecified: Secondary | ICD-10-CM | POA: Insufficient documentation

## 2014-01-24 DIAGNOSIS — D500 Iron deficiency anemia secondary to blood loss (chronic): Secondary | ICD-10-CM | POA: Insufficient documentation

## 2014-01-24 DIAGNOSIS — M3500 Sicca syndrome, unspecified: Secondary | ICD-10-CM | POA: Insufficient documentation

## 2014-01-24 DIAGNOSIS — N6091 Unspecified benign mammary dysplasia of right breast: Secondary | ICD-10-CM | POA: Insufficient documentation

## 2014-01-24 DIAGNOSIS — I471 Supraventricular tachycardia: Secondary | ICD-10-CM | POA: Insufficient documentation

## 2014-01-24 DIAGNOSIS — M1990 Unspecified osteoarthritis, unspecified site: Secondary | ICD-10-CM | POA: Insufficient documentation

## 2014-01-24 DIAGNOSIS — K649 Unspecified hemorrhoids: Secondary | ICD-10-CM | POA: Diagnosis not present

## 2014-01-24 DIAGNOSIS — Z885 Allergy status to narcotic agent status: Secondary | ICD-10-CM | POA: Insufficient documentation

## 2014-01-24 DIAGNOSIS — I7300 Raynaud's syndrome without gangrene: Secondary | ICD-10-CM | POA: Insufficient documentation

## 2014-01-24 DIAGNOSIS — Z888 Allergy status to other drugs, medicaments and biological substances status: Secondary | ICD-10-CM | POA: Insufficient documentation

## 2014-01-24 DIAGNOSIS — I2510 Atherosclerotic heart disease of native coronary artery without angina pectoris: Secondary | ICD-10-CM | POA: Insufficient documentation

## 2014-01-24 DIAGNOSIS — K219 Gastro-esophageal reflux disease without esophagitis: Secondary | ICD-10-CM | POA: Insufficient documentation

## 2014-01-24 DIAGNOSIS — Z803 Family history of malignant neoplasm of breast: Secondary | ICD-10-CM | POA: Insufficient documentation

## 2014-01-24 HISTORY — PX: BREAST LUMPECTOMY WITH RADIOACTIVE SEED LOCALIZATION: SHX6424

## 2014-01-24 HISTORY — DX: Insomnia, unspecified: G47.00

## 2014-01-24 LAB — POCT HEMOGLOBIN-HEMACUE: Hemoglobin: 13.4 g/dL (ref 12.0–15.0)

## 2014-01-24 SURGERY — BREAST LUMPECTOMY WITH RADIOACTIVE SEED LOCALIZATION
Anesthesia: General | Site: Breast | Laterality: Right

## 2014-01-24 MED ORDER — OXYCODONE HCL 5 MG/5ML PO SOLN: 5.0000 mg | Freq: Once | ORAL | Status: AC | PRN

## 2014-01-24 MED ORDER — PROPOFOL 10 MG/ML IV BOLUS
INTRAVENOUS | Status: DC | PRN
  Administered 2014-01-24: 150 mg via INTRAVENOUS

## 2014-01-24 MED ORDER — HYDROCODONE-ACETAMINOPHEN 5-325 MG PO TABS: 1.0000 | ORAL_TABLET | ORAL | Status: DC | PRN

## 2014-01-24 MED ORDER — MIDAZOLAM HCL 2 MG/2ML IJ SOLN: 1.0000 mg | INTRAMUSCULAR | Status: DC | PRN

## 2014-01-24 MED ORDER — CEFAZOLIN SODIUM-DEXTROSE 2-3 GM-% IV SOLR
2.0000 g | INTRAVENOUS | Status: AC
  Administered 2014-01-24: 2 g via INTRAVENOUS

## 2014-01-24 MED ORDER — LACTATED RINGERS IV SOLN
INTRAVENOUS | Status: DC
  Administered 2014-01-24 (×2): via INTRAVENOUS

## 2014-01-24 MED ORDER — ONDANSETRON HCL 4 MG/2ML IJ SOLN
INTRAMUSCULAR | Status: DC | PRN
  Administered 2014-01-24: 4 mg via INTRAVENOUS

## 2014-01-24 MED ORDER — SODIUM BICARBONATE 4 % IV SOLN
INTRAVENOUS | Status: AC
  Filled 2014-01-24: qty 5

## 2014-01-24 MED ORDER — OXYCODONE HCL 5 MG PO TABS: 5.0000 mg | ORAL_TABLET | ORAL | Status: DC | PRN

## 2014-01-24 MED ORDER — MIDAZOLAM HCL 2 MG/2ML IJ SOLN
INTRAMUSCULAR | Status: AC
  Filled 2014-01-24: qty 2

## 2014-01-24 MED ORDER — OXYCODONE HCL 5 MG PO TABS
ORAL_TABLET | ORAL | Status: AC
  Filled 2014-01-24: qty 1

## 2014-01-24 MED ORDER — FENTANYL CITRATE 0.05 MG/ML IJ SOLN: 25.0000 ug | INTRAMUSCULAR | Status: DC | PRN

## 2014-01-24 MED ORDER — FENTANYL CITRATE 0.05 MG/ML IJ SOLN: 50.0000 ug | INTRAMUSCULAR | Status: DC | PRN

## 2014-01-24 MED ORDER — FENTANYL CITRATE 0.05 MG/ML IJ SOLN
INTRAMUSCULAR | Status: DC | PRN
  Administered 2014-01-24: 100 ug via INTRAVENOUS
  Administered 2014-01-24: 25 ug via INTRAVENOUS

## 2014-01-24 MED ORDER — BUPIVACAINE HCL (PF) 0.5 % IJ SOLN
INTRAMUSCULAR | Status: AC
  Filled 2014-01-24: qty 30

## 2014-01-24 MED ORDER — SODIUM CHLORIDE 0.9 % IJ SOLN: 3.0000 mL | INTRAMUSCULAR | Status: DC | PRN

## 2014-01-24 MED ORDER — BUPIVACAINE HCL (PF) 0.5 % IJ SOLN
INTRAMUSCULAR | Status: DC | PRN
  Administered 2014-01-24: 15 mL

## 2014-01-24 MED ORDER — ONDANSETRON HCL 4 MG/2ML IJ SOLN: 4.0000 mg | Freq: Four times a day (QID) | INTRAMUSCULAR | Status: DC | PRN

## 2014-01-24 MED ORDER — CEFAZOLIN SODIUM-DEXTROSE 2-3 GM-% IV SOLR
INTRAVENOUS | Status: AC
  Filled 2014-01-24: qty 50

## 2014-01-24 MED ORDER — LIDOCAINE HCL (PF) 1 % IJ SOLN
INTRAMUSCULAR | Status: AC
  Filled 2014-01-24: qty 30

## 2014-01-24 MED ORDER — FENTANYL CITRATE 0.05 MG/ML IJ SOLN
INTRAMUSCULAR | Status: AC
  Filled 2014-01-24: qty 4

## 2014-01-24 MED ORDER — OXYCODONE HCL 5 MG PO TABS
5.0000 mg | ORAL_TABLET | Freq: Once | ORAL | Status: AC | PRN
  Administered 2014-01-24: 5 mg via ORAL

## 2014-01-24 MED ORDER — LIDOCAINE HCL (CARDIAC) 20 MG/ML IV SOLN
INTRAVENOUS | Status: DC | PRN
  Administered 2014-01-24: 75 mg via INTRAVENOUS

## 2014-01-24 MED ORDER — SUCCINYLCHOLINE CHLORIDE 20 MG/ML IJ SOLN
INTRAMUSCULAR | Status: AC
  Filled 2014-01-24: qty 1

## 2014-01-24 MED ORDER — FENTANYL CITRATE 0.05 MG/ML IJ SOLN
INTRAMUSCULAR | Status: AC
  Filled 2014-01-24: qty 2

## 2014-01-24 MED ORDER — PROPOFOL 10 MG/ML IV EMUL
INTRAVENOUS | Status: AC
  Filled 2014-01-24: qty 100

## 2014-01-24 MED ORDER — ACETAMINOPHEN 650 MG RE SUPP: 650.0000 mg | RECTAL | Status: DC | PRN

## 2014-01-24 MED ORDER — ACETAMINOPHEN 325 MG PO TABS
650.0000 mg | ORAL_TABLET | ORAL | Status: DC | PRN
Start: 2014-01-24 — End: 2014-01-24

## 2014-01-24 MED ORDER — DEXAMETHASONE SODIUM PHOSPHATE 4 MG/ML IJ SOLN
INTRAMUSCULAR | Status: DC | PRN
  Administered 2014-01-24: 10 mg via INTRAVENOUS

## 2014-01-24 SURGICAL SUPPLY — 45 items
APL SKNCLS STERI-STRIP NONHPOA (GAUZE/BANDAGES/DRESSINGS) ×1
APPLIER CLIP 9.375 MED OPEN (MISCELLANEOUS)
APR CLP MED 9.3 20 MLT OPN (MISCELLANEOUS)
BENZOIN TINCTURE PRP APPL 2/3 (GAUZE/BANDAGES/DRESSINGS) ×3 IMPLANT
BLADE SURG 15 STRL LF DISP TIS (BLADE) ×1 IMPLANT
BLADE SURG 15 STRL SS (BLADE) ×3
CANISTER SUC SOCK COL 7IN (MISCELLANEOUS) IMPLANT
CANISTER SUCT 1200ML W/VALVE (MISCELLANEOUS) ×2 IMPLANT
CHLORAPREP W/TINT 26ML (MISCELLANEOUS) ×3 IMPLANT
CLIP APPLIE 9.375 MED OPEN (MISCELLANEOUS) IMPLANT
CLOSURE WOUND 1/2 X4 (GAUZE/BANDAGES/DRESSINGS) ×1
COVER BACK TABLE 60X90IN (DRAPES) ×3 IMPLANT
COVER MAYO STAND STRL (DRAPES) ×3 IMPLANT
COVER PROBE W GEL 5X96 (DRAPES) ×3 IMPLANT
DECANTER SPIKE VIAL GLASS SM (MISCELLANEOUS) IMPLANT
DEVICE DUBIN W/COMP PLATE 8390 (MISCELLANEOUS) ×2 IMPLANT
DRAPE PED LAPAROTOMY (DRAPES) ×3 IMPLANT
DRAPE UTILITY XL STRL (DRAPES) ×3 IMPLANT
ELECT COATED BLADE 2.86 ST (ELECTRODE) ×3 IMPLANT
ELECT REM PT RETURN 9FT ADLT (ELECTROSURGICAL) ×3
ELECTRODE REM PT RTRN 9FT ADLT (ELECTROSURGICAL) ×1 IMPLANT
GAUZE SPONGE 4X4 12PLY STRL (GAUZE/BANDAGES/DRESSINGS) ×3 IMPLANT
GLOVE BIOGEL PI IND STRL 8.5 (GLOVE) ×1 IMPLANT
GLOVE BIOGEL PI INDICATOR 8.5 (GLOVE) ×2
GLOVE ECLIPSE 8.0 STRL XLNG CF (GLOVE) ×3 IMPLANT
GOWN STRL REUS W/ TWL LRG LVL3 (GOWN DISPOSABLE) ×2 IMPLANT
GOWN STRL REUS W/TWL LRG LVL3 (GOWN DISPOSABLE) ×6
NDL HYPO 25X1 1.5 SAFETY (NEEDLE) ×1 IMPLANT
NEEDLE HYPO 25X1 1.5 SAFETY (NEEDLE) ×3 IMPLANT
NS IRRIG 1000ML POUR BTL (IV SOLUTION) ×3 IMPLANT
PACK BASIN DAY SURGERY FS (CUSTOM PROCEDURE TRAY) ×3 IMPLANT
PENCIL BUTTON HOLSTER BLD 10FT (ELECTRODE) ×3 IMPLANT
SLEEVE SCD COMPRESS KNEE MED (MISCELLANEOUS) ×2 IMPLANT
SPONGE GAUZE 4X4 12PLY STER LF (GAUZE/BANDAGES/DRESSINGS) ×3 IMPLANT
SPONGE LAP 4X18 X RAY DECT (DISPOSABLE) ×3 IMPLANT
STRIP CLOSURE SKIN 1/2X4 (GAUZE/BANDAGES/DRESSINGS) ×2 IMPLANT
SUT MON AB 4-0 PC3 18 (SUTURE) ×3 IMPLANT
SUT SILK 2 0 FS (SUTURE) ×3 IMPLANT
SUT VICRYL 3-0 CR8 SH (SUTURE) ×3 IMPLANT
SYR CONTROL 10ML LL (SYRINGE) ×3 IMPLANT
TOWEL OR 17X24 6PK STRL BLUE (TOWEL DISPOSABLE) ×6 IMPLANT
TOWEL OR NON WOVEN STRL DISP B (DISPOSABLE) ×3 IMPLANT
TUBE CONNECTING 20'X1/4 (TUBING) ×1
TUBE CONNECTING 20X1/4 (TUBING) ×1 IMPLANT
YANKAUER SUCT BULB TIP NO VENT (SUCTIONS) ×2 IMPLANT

## 2014-01-24 NOTE — Transfer of Care (Signed)
Immediate Anesthesia Transfer of Care Note  Patient: [name]  Procedure(s) Performed: Procedure(s): BREAST LUMPECTOMY WITH RADIOACTIVE SEED LOCALIZATION (Right)  Patient Location: PACU  Anesthesia Type:General  Level of Consciousness: awake, alert  and oriented  Airway & Oxygen Therapy: Patient Spontanous Breathing and Patient connected to face mask oxygen  Post-op Assessment: Report given to PACU RN and Post -op Vital signs reviewed and stable  Post vital signs: Reviewed and stable  Complications: No apparent anesthesia complications

## 2014-01-24 NOTE — Interval H&P Note (Signed)
History and Physical Interval Note:  01/24/2014 12:32 PM  [name]  has presented today for surgery, with the diagnosis of right breast ADH  The various methods of treatment have been discussed with the patient and family. After consideration of risks, benefits and other options for treatment, the patient has consented to  Procedure(s): BREAST LUMPECTOMY WITH RADIOACTIVE SEED LOCALIZATION (Right) as a surgical intervention .  The patient's history has been reviewed, patient examined, no change in status, stable for surgery.  I have reviewed the patient's chart and labs.  Questions were answered to the patient's satisfaction.     [name] Shela Commons

## 2014-01-24 NOTE — Op Note (Signed)
Operative Note  [name] female 78 y.o. 01/24/2014  PREOPERATIVE DX:  Atypical ductal hyperplasia right breast  POSTOPERATIVE DX:  Same  PROCEDURE:   Right partial mastectomy after radioactive seed localization         Surgeon: Adolph Pollack   Assistants: none  Anesthesia: General endotracheal anesthesia  Indications:   This is a 78 year old female with a family history of breast cancer. She had an atypical lesion noted in the upper outer quadrant of the right breast. Biopsy was consistent with atypical ductal hyperplasia. She is now here for wider excision. She had successful implantation of a radioactive seed earlier in the week.    Procedure Detail:  She was seen in the holding area. Mapping of the radioactive seed was performed and was positive. The right breast was marked with my initials. She was brought to the operating room placed supine on the operating table and general anesthetic was given. The right breast was sterilely prepped and draped.  Using the neoprobe, the area of increased counts was found in the lateral aspect of the right breast and marked.  Local anesthetic consisting of 1/2% plain Marcaine was infiltrated into the skin and dermis at this site . A curvilinear incision was made in the lateral aspect of the breast and carried through the subcutaneous tissues. Using the neoprobe the area of increased counts was identified.  Using electrocautery, a lumpectomy was performed to keep the area pennies decreased counts in the central portion of the lumpectomy. The anterior aspect was marked with a single suture, the medial aspect with mark with a double suture area specimen mammogram was performed and the clip and radioactive seed as well as the area of concern were in the middle of the specimen. This was verified by the radiologist. Specimen was sent to pathology. The pathologist called and said they had retrieved the seed.  There was no further radioactivity at  the partial mastectomy site. Bleeding was controlled with electrocautery. More local anesthetic was infiltrated into the subcutaneous tissues. Once hemostasis was adequate, the subcutaneous tissue was approximated with interrupted 3-0 Vicryl sutures. The skin was closed with a running 4-0 Monocryl subcuticular stitch. Steri-Strips and sterile dressings were applied.  She tolerated the procedure well without any apparent complications and was taken to the recovery room in satisfactory condition.  Estimated Blood Loss:  100 ml         Specimens: right breast tissue        Complications:  * No complications entered in OR log *         Disposition: PACU - hemodynamically stable.         Condition: stable

## 2014-01-24 NOTE — Anesthesia Preprocedure Evaluation (Addendum)
Anesthesia Evaluation  Patient identified by MRN, date of birth, ID band Patient awake    Reviewed: Allergy & Precautions, H&P , NPO status , Patient's Chart, lab work & pertinent test results  Airway Mallampati: II   Neck ROM: full    Dental  (+) Teeth Intact, Caps, Dental Advisory Given   Pulmonary  breath sounds clear to auscultation        Cardiovascular hypertension, + CAD and + Peripheral Vascular Disease + dysrhythmias Supra Ventricular Tachycardia + Valvular Problems/Murmurs AI Rhythm:Regular Rate:Normal  Cath (2006): mild to mod CAD. Medical management.  08/2014: Normal stress test. EF 76%  TTE shows mild to moderate AI and moderate TR.   Neuro/Psych  Headaches,    GI/Hepatic GERD-  ,  Endo/Other    Renal/GU      Musculoskeletal  (+) Arthritis -,   Abdominal   Peds  Hematology   Anesthesia Other Findings   Reproductive/Obstetrics                           Anesthesia Physical Anesthesia Plan  ASA: III  Anesthesia Plan: General   Post-op Pain Management:    Induction: Intravenous  Airway Management Planned: LMA  Additional Equipment:   Intra-op Plan:   Post-operative Plan:   Informed Consent: I have reviewed the patients History and Physical, chart, labs and discussed the procedure including the risks, benefits and alternatives for the proposed anesthesia with the patient or authorized representative who has indicated his/her understanding and acceptance.     Plan Discussed with: CRNA, Anesthesiologist and Surgeon  Anesthesia Plan Comments:         Anesthesia Quick Evaluation

## 2014-01-24 NOTE — Anesthesia Postprocedure Evaluation (Signed)
  Anesthesia Post-op Note  Patient: [name]  Procedure(s) Performed: Procedure(s): BREAST LUMPECTOMY WITH RADIOACTIVE SEED LOCALIZATION (Right)  Patient Location: PACU  Anesthesia Type:General  Level of Consciousness: awake and alert   Airway and Oxygen Therapy: Patient Spontanous Breathing  Post-op Pain: mild  Post-op Assessment: Post-op Vital signs reviewed  Post-op Vital Signs: stable  Last Vitals:  Filed Vitals:   01/24/14 1415  BP: 137/72  Pulse: 75  Temp:   Resp: 14    Complications: No apparent anesthesia complications

## 2014-01-24 NOTE — Anesthesia Procedure Notes (Signed)
Procedure Name: LMA Insertion Date/Time: 01/24/2014 12:45 PM Performed by: Zenia Resides D Pre-anesthesia Checklist: Patient identified, Emergency Drugs available, Suction available and Patient being monitored Patient Re-evaluated:Patient Re-evaluated prior to inductionOxygen Delivery Method: Circle System Utilized Preoxygenation: Pre-oxygenation with 100% oxygen Intubation Type: IV induction Ventilation: Mask ventilation without difficulty LMA: LMA inserted LMA Size: 4.0 Number of attempts: 1 Airway Equipment and Method: bite block Placement Confirmation: positive ETCO2 Tube secured with: Tape Dental Injury: Teeth and Oropharynx as per pre-operative assessment

## 2014-01-24 NOTE — Discharge Instructions (Addendum)
[facility] Office Phone Number [phone]  BREAST BIOPSY/ LUMPECTOMY: POST OP INSTRUCTIONS  Always review your discharge instruction sheet given to you by the facility where your surgery was performed.  IF YOU HAVE DISABILITY OR FAMILY LEAVE FORMS, YOU MUST BRING THEM TO THE OFFICE FOR PROCESSING.  DO NOT GIVE THEM TO YOUR DOCTOR.  1. A prescription for pain medication may be given to you upon discharge.  Take your pain medication as prescribed, if needed.  If narcotic pain medicine is not needed, then you may take acetaminophen (Tylenol) or ibuprofen (Advil) as needed. 2. Take your usually prescribed medications unless otherwise directed 3. If you need a refill on your pain medication, please contact your pharmacy.  They will contact our office to request authorization.  Prescriptions will not be filled after 5pm or on week-ends. 4. You should eat very light the first 24 hours after surgery, such as soup, crackers, pudding, etc.  Resume your normal diet the day after surgery. 5. Most patients will experience some swelling and bruising in the breast.  Ice packs and a good support bra will help.  Swelling and bruising can take several days to resolve.  6. It is common to experience some constipation if taking pain medication after surgery.  Increasing fluid intake and taking a stool softener will usually help or prevent this problem from occurring.  A mild laxative (Milk of Magnesia or Miralax) should be taken according to package directions if there are no bowel movements after 48 hours. 7. Unless discharge instructions indicate otherwise, you may remove your bandages 48 hours after surgery, and you may shower at that time.  You may have steri-strips (small skin tapes) in place directly over the incision.  These strips should be left on the skin.  If your surgeon used skin glue on the incision, you may shower in 24 hours.  The glue will flake off over the next 2-3 weeks.  Any  sutures or staples will be removed at the office during your follow-up visit. 8. ACTIVITIES:  You may resume regular daily activities (gradually increasing) in 3 days.  Wearing a good support bra or sports bra minimizes pain and swelling.  You may have sexual intercourse when it is comfortable. a. You may drive when you no longer are taking prescription pain medication, you can comfortably wear a seatbelt, and you can safely maneuver your car and apply brakes. b. RETURN TO WORK:  ______________________________________________________________________________________ 9. You should see your doctor in the office for a follow-up appointment approximately two weeks after your surgery.  10. OTHER INSTRUCTIONS: _______________________________________________________________________________________________ _____________________________________________________________________________________________________________________________________ _____________________________________________________________________________________________________________________________________ _____________________________________________________________________________________________________________________________________  WHEN TO CALL YOUR DOCTOR: 1. Fever over 101.0 2. Nausea and/or vomiting. 3. Extreme swelling or bruising. 4. Continued bleeding from incision. 5. Increased pain, redness, or drainage from the incision.  The clinic staff is available to answer your questions during regular business hours.  Please dont hesitate to call and ask to speak to one of the nurses for clinical concerns.  If you have a medical emergency, go to the nearest emergency room or call 911.  A surgeon from [facility] is always on call at the hospital.  For further questions, please visit [email or web address]    Post Anesthesia Home Care Instructions  Activity: Get plenty of rest for the remainder of the day. A  responsible adult should stay with you for 24 hours following the procedure.  For the next 24 hours, DO NOT: -Drive a car -Advertising copywriter -Drink alcoholic beverages -Take  any medication unless instructed by your physician -Make any legal decisions or sign important papers.  Meals: Start with liquid foods such as gelatin or soup. Progress to regular foods as tolerated. Avoid greasy, spicy, heavy foods. If nausea and/or vomiting occur, drink only clear liquids until the nausea and/or vomiting subsides. Call your physician if vomiting continues.  Special Instructions/Symptoms: Your throat may feel dry or sore from the anesthesia or the breathing tube placed in your throat during surgery. If this causes discomfort, gargle with warm salt water. The discomfort should disappear within 24 hours.

## 2014-01-24 NOTE — H&P (Signed)
01/24/14   [name]  The patient is a 78 year old female who presents with a complaint of breast problems.  Note:She is referred by Dr. Britta Mccreedy because of atypical ductal hyperplasia of the right breast. Abnormal microcalcifications were seen in the upper outer quadrant of the right breast on mammogram. Image guided biopsy was performed and pathology was consistent with atypical ductal hyperplasia. She has a very strong family history of breast cancer involving her sisters and daughter. She thinks one of them they have had genetic testing. She is going to research that. She's had a left breast biopsy performed in the past which was benign.  Other Problems Renato Gails; 12/15/2013 11:36 AM) Arthritis Back Pain Hemorrhoids High blood pressure  Past Surgical History Renato Gails; 12/15/2013 11:36 AM) Appendectomy Breast Biopsy Left. Breast Mass; Local Excision Right. Gallbladder Surgery - Laparoscopic Hysterectomy (not due to cancer) - Complete  Diagnostic Studies History Renato Gails; 12/15/2013 11:36 AM) Colonoscopy within last year Mammogram within last year  Allergies Renato Gails; 12/15/2013 11:39 AM) Morphine Derivatives Ambien *HYPNOTICS/SEDATIVES/SLEEP DISORDER AGENTS* Prevacid *ULCER DRUGS* Plaquenil *ANTIMALARIALS* Simvastatin *ANTIHYPERLIPIDEMICS*  Medication History Renato Gails; 12/15/2013 11:41 AM) Temazepam (15MG  Capsule, Oral at bedtime) Active. AmLODIPine Besylate (5MG  Tablet, Oral daily) Active. Biotin (10MG  Capsule, Oral daily) Active. Multivitamin Adults 50+ (Oral daily) Active. Vitamin D3 High Potency (1000UNIT Capsule, Oral daily) Active. Glucosamine Chondroitin Adv (Oral daily) Active.  Social History Renato Gails; 12/15/2013 11:36 AM) Alcohol use Occasional alcohol use. No caffeine use No drug use Tobacco use Never smoker.  Family History Renato Gails; 12/15/2013 11:36 AM) Arthritis  Mother. Breast Cancer Daughter, Sister. Cerebrovascular Accident Brother, Family Members In General. Depression Daughter. Diabetes Mellitus Brother, Mother. Heart Disease Brother, Father, Mother. Heart disease in female family member before age 53 Hypertension Father, Mother. Kidney Disease Mother.  Pregnancy / Birth History Renato Gails; 12/15/2013 11:36 AM) Age at menarche 14 years. Age of menopause <45 Gravida 3 Maternal age 8-20 Para 3     Review of Systems Renato Gails; 12/15/2013 11:36 AM) General Present- Appetite Loss, Fatigue and Weight Loss. Not Present- Chills, Fever, Night Sweats and Weight Gain. Skin Not Present- Change in Wart/Mole, Dryness, Hives, Jaundice, New Lesions, Non-Healing Wounds, Rash and Ulcer. HEENT Present- Hearing Loss, Ringing in the Ears and Wears glasses/contact lenses. Not Present- Earache, Hoarseness, Nose Bleed, Oral Ulcers, Seasonal Allergies, Sinus Pain, Sore Throat, Visual Disturbances and Yellow Eyes. Cardiovascular Present- Leg Cramps, Palpitations, Shortness of Breath and Swelling of Extremities. Not Present- Chest Pain, Difficulty Breathing Lying Down and Rapid Heart Rate. Gastrointestinal Present- Hemorrhoids and Indigestion. Not Present- Abdominal Pain, Bloating, Bloody Stool, Change in Bowel Habits, Chronic diarrhea, Constipation, Difficulty Swallowing, Excessive gas, Gets full quickly at meals, Nausea, Rectal Pain and Vomiting. Female Genitourinary Present- Nocturia. Not Present- Frequency, Painful Urination, Pelvic Pain and Urgency. Musculoskeletal Present- Back Pain, Joint Pain and Muscle Weakness. Not Present- Joint Stiffness, Muscle Pain and Swelling of Extremities. Neurological Present- Tingling. Not Present- Decreased Memory, Fainting, Headaches, Numbness, Seizures, Tremor, Trouble walking and Weakness. Endocrine Present- Hair Changes. Not Present- Cold Intolerance, Excessive Hunger, Heat Intolerance, Hot flashes and  New Diabetes.  Vitals Fleet Contras [place]; 12/15/2013 11:38 AM) 12/15/2013 11:37 AM Weight: 132.5 lb Height: 63in Body Surface Area: 1.63 m Body Mass Index: 23.47 kg/m Temp.: 98.2F(Oral)  Pulse: 64 (Regular)  Resp.: 16 (Unlabored)  BP: 118/62 (Sitting, Left Arm, Standard)     Physical Exam Adolph Pollack MD; 12/15/2013 12:16 PM)  The physical exam findings are as  follows: Note:General: WDWN in NAD. Pleasant and cooperative.  HEENT: [place]/AT, no facial masses  EYES: EOMI, no icterus  NECK: Supple, no obvious mass or thyroid enlargement.  BREASTS: Symmetrical in size. No dominant masses, nipple discharge or suspicious skin lesions.  ABDOMEN: Soft, nontender, nondistended, no masses, no organomegaly, active bowel sounds, no scars, no hernias.  MUSCULOSKELETAL: FROM, good muscle tone, no edema, fingers are purplish and cool  LYMPHATIC: No palpable cervical, supraclavicular, axillary adenopathy.  NEUROLOGIC: Alert and oriented, answers questions appropriately, normal gait and station.  PSYCHIATRIC: Normal mood, affect , and behavior.    Assessment & Plan:  ATYPICAL DUCTAL HYPERPLASIA OF RIGHT BREAST (610.8  N62) Impression: She has a strong family history of breast cancer. Wider excision of the area has been recommended and I agree with that recommendation and discussed this with her. She has had a preop cardiology evaluation and is an acceptable risk to proceed with the surgery.  Plan: Right breast lumpectomy after radioactive seed localization or wire localization.   Avel Peaceodd Jaequan Propes

## 2014-01-25 ENCOUNTER — Encounter (HOSPITAL_BASED_OUTPATIENT_CLINIC_OR_DEPARTMENT_OTHER): Payer: Self-pay | Admitting: General Surgery

## 2014-01-26 ENCOUNTER — Encounter (INDEPENDENT_AMBULATORY_CARE_PROVIDER_SITE_OTHER): Payer: Self-pay | Admitting: General Surgery

## 2014-01-26 NOTE — Progress Notes (Signed)
Patient ID: [name], female   DOB: December 12, 1935, 78 y.o.   MRN: [id number] Pathology (below) discussed with her.  Will talk with her about the high risk clinic at her postop visit.  Diagnosis Breast, lumpectomy, right with radioactive s e e d - ATYPICAL DUCTAL HYPERPLASIA, SEE COMMENT. - PREVIOUS BIOPSY SITE PRESENT. - SURGICAL MARGINS, NEGATIVE FOR ATYPIA OR MALIGNANCY.

## 2014-02-01 ENCOUNTER — Encounter: Payer: Self-pay | Admitting: Neurology

## 2014-02-07 ENCOUNTER — Encounter (INDEPENDENT_AMBULATORY_CARE_PROVIDER_SITE_OTHER): Payer: Self-pay | Admitting: General Surgery

## 2014-02-07 ENCOUNTER — Encounter: Payer: Self-pay | Admitting: Neurology

## 2014-02-07 NOTE — Progress Notes (Unsigned)
Patient ID: [name], female   DOB: 03-22-1935, 78 y.o.   MRN: [id number]  [name] 02/07/2014 1:54 PM Location: [facility] Patient #: [id number] DOB: April 10, 1935 Married / Language: [place] / Race: White Female History of Present Illness Adolph Pollack MD; 02/07/2014 2:19 PM) Patient words: post Op lumpectomy.  The patient is a 78 year old female    Note:She is here for her first postop visit following a right breast lumpectomy after RSL 01/24/14. Pathology demonstrated ADH with no malignancy. Margin were clear. She had minimal pain.  Other Problems Kerrie Buffalo, CMA; 02/07/2014 1:54 PM) Arthritis Back Pain Gastroesophageal Reflux Disease Hemorrhoids High blood pressure Hypercholesterolemia Other disease, cancer, significant illness  Past Surgical History Kerrie Buffalo, CMA; 02/07/2014 1:54 PM) Appendectomy Breast Biopsy Bilateral, Left. Colon Polyp Removal - Colonoscopy Gallbladder Surgery - Open Hysterectomy (not due to cancer) - Partial  Diagnostic Studies History Kerrie Buffalo, CMA; 02/07/2014 1:54 PM) Colonoscopy 1-5 years ago within last year Mammogram within last year  Social History Kerrie Buffalo, CMA; 02/07/2014 1:54 PM) Alcohol use Occasional alcohol use. No caffeine use No drug use Tobacco use Never smoker.  Family History Kerrie Buffalo, [place]; 02/07/2014 1:54 PM) Arthritis Mother. Bleeding disorder Daughter. Breast Cancer Daughter, Sister. Heart Disease Brother, Father, Mother. Heart disease in female family member before age 34 Heart disease in female family member before age 66 Hypertension Father, Mother. Kidney Disease Mother.  Pregnancy / Birth History Kerrie Buffalo, CMA; 02/07/2014 1:54 PM) Age at menarche 14 years. Age of menopause <45 Gravida 3 Maternal age 7-20 Para 3     Review of Systems Kerrie Buffalo CMA; 02/07/2014 1:54 PM) General Present- Appetite  Loss, Chills and Fatigue. Not Present- Fever, Night Sweats, Weight Gain and Weight Loss. Skin Not Present- Change in Wart/Mole, Dryness, Hives, Jaundice, New Lesions, Non-Healing Wounds, Rash and Ulcer. HEENT Present- Hearing Loss, Ringing in the Ears and Visual Disturbances. Not Present- Earache, Hoarseness, Nose Bleed, Oral Ulcers, Seasonal Allergies, Sinus Pain, Sore Throat, Wears glasses/contact lenses and Yellow Eyes. Respiratory Not Present- Bloody sputum, Chronic Cough, Difficulty Breathing, Snoring and Wheezing. Cardiovascular Present- Leg Cramps and Rapid Heart Rate. Not Present- Chest Pain, Difficulty Breathing Lying Down, Palpitations, Shortness of Breath and Swelling of Extremities. Gastrointestinal Present- Hemorrhoids and Indigestion. Not Present- Abdominal Pain, Bloating, Bloody Stool, Change in Bowel Habits, Chronic diarrhea, Constipation, Difficulty Swallowing, Excessive gas, Gets full quickly at meals, Nausea, Rectal Pain and Vomiting. Female Genitourinary Present- Nocturia and Urgency. Not Present- Frequency, Painful Urination and Pelvic Pain. Musculoskeletal Present- Back Pain, Joint Pain, Joint Stiffness, Muscle Pain and Muscle Weakness. Not Present- Swelling of Extremities. Neurological Present- Headaches, Numbness and Tingling. Not Present- Decreased Memory, Fainting, Seizures, Tremor, Trouble walking and Weakness. Endocrine Present- Cold Intolerance and Hair Changes. Not Present- Excessive Hunger, Heat Intolerance, Hot flashes and New Diabetes. Hematology Present- Easy Bruising. Not Present- Excessive bleeding, Gland problems, HIV and Persistent Infections.  Vitals Kerrie Buffalo CMA; 02/07/2014 1:59 PM) 02/07/2014 1:55 PM Weight: 131.13 lb Height: 63in Body Surface Area: 1.63 m Body Mass Index: 23.23 kg/m Temp.: 97.66F  Pulse: 72 (Regular)  BP: 120/52 (Sitting, Left Arm, Standard)     Physical Exam Adolph Pollack MD; 02/07/2014 2:19 PM)  The  physical exam findings are as follows: Note:Right breast incision is clean and intact    Assessment & Plan Adolph Pollack MD; 02/07/2014 2:20 PM)  POSTOPERATIVE STATE (V45.89  Z98.89) Impression: Wound is healing well and pathology is benign but she at increased  risk. She also has a family history of breast cancer.  Plan: I recommended referral to the high risk for breast cancer clinic and she is in agreement with this. We'll make this referral.  [name]

## 2014-02-08 ENCOUNTER — Telehealth: Payer: Self-pay | Admitting: *Deleted

## 2014-02-08 NOTE — Telephone Encounter (Signed)
Received referral from [facility].  Called pt and confirmed 02/22/14 high risk appt.  Mailed calendar, welcoming packet & intake form to pt.  Emailed Scientist, research (medical) at [facility] to make her aware.  Created chart, labs entered, added to spreadsheet and placed in Dr. Darrall Dears box.

## 2014-02-21 ENCOUNTER — Other Ambulatory Visit: Payer: Self-pay | Admitting: *Deleted

## 2014-02-21 ENCOUNTER — Other Ambulatory Visit: Payer: Self-pay | Admitting: Emergency Medicine

## 2014-02-21 DIAGNOSIS — N6091 Unspecified benign mammary dysplasia of right breast: Secondary | ICD-10-CM

## 2014-02-22 ENCOUNTER — Ambulatory Visit: Payer: Medicare Other

## 2014-02-22 ENCOUNTER — Other Ambulatory Visit (HOSPITAL_BASED_OUTPATIENT_CLINIC_OR_DEPARTMENT_OTHER): Payer: Medicare Other

## 2014-02-22 ENCOUNTER — Ambulatory Visit (HOSPITAL_BASED_OUTPATIENT_CLINIC_OR_DEPARTMENT_OTHER): Payer: Medicare Other | Admitting: Oncology

## 2014-02-22 VITALS — BP 155/66 | HR 70 | Temp 97.9°F | Resp 18 | Ht 63.0 in | Wt 129.4 lb

## 2014-02-22 DIAGNOSIS — N62 Hypertrophy of breast: Secondary | ICD-10-CM

## 2014-02-22 DIAGNOSIS — N6091 Unspecified benign mammary dysplasia of right breast: Secondary | ICD-10-CM

## 2014-02-22 DIAGNOSIS — Z803 Family history of malignant neoplasm of breast: Secondary | ICD-10-CM

## 2014-02-22 HISTORY — DX: Unspecified benign mammary dysplasia of right breast: N60.91

## 2014-02-22 LAB — CBC WITH DIFFERENTIAL/PLATELET
BASO%: 0.6 % (ref 0.0–2.0)
Basophils Absolute: 0 10*3/uL (ref 0.0–0.1)
EOS%: 3.3 % (ref 0.0–7.0)
Eosinophils Absolute: 0.2 10*3/uL (ref 0.0–0.5)
HCT: 38.3 % (ref 34.8–46.6)
HGB: 11.8 g/dL (ref 11.6–15.9)
LYMPH%: 22.7 % (ref 14.0–49.7)
MCH: 26.7 pg (ref 25.1–34.0)
MCHC: 30.9 g/dL — ABNORMAL LOW (ref 31.5–36.0)
MCV: 86.7 fL (ref 79.5–101.0)
MONO#: 0.6 10*3/uL (ref 0.1–0.9)
MONO%: 11.8 % (ref 0.0–14.0)
NEUT%: 61.6 % (ref 38.4–76.8)
NEUTROS ABS: 3.2 10*3/uL (ref 1.5–6.5)
Platelets: 205 10*3/uL (ref 145–400)
RBC: 4.42 10*6/uL (ref 3.70–5.45)
RDW: 15.4 % — AB (ref 11.2–14.5)
WBC: 5.2 10*3/uL (ref 3.9–10.3)
lymph#: 1.2 10*3/uL (ref 0.9–3.3)

## 2014-02-22 LAB — COMPREHENSIVE METABOLIC PANEL (CC13)
ALBUMIN: 3.9 g/dL (ref 3.5–5.0)
ALK PHOS: 62 U/L (ref 40–150)
ALT: 15 U/L (ref 0–55)
AST: 21 U/L (ref 5–34)
Anion Gap: 10 mEq/L (ref 3–11)
BUN: 18.1 mg/dL (ref 7.0–26.0)
CO2: 24 mEq/L (ref 22–29)
Calcium: 10 mg/dL (ref 8.4–10.4)
Chloride: 105 mEq/L (ref 98–109)
Creatinine: 1 mg/dL (ref 0.6–1.1)
EGFR: 52 mL/min/{1.73_m2} — ABNORMAL LOW (ref >90)
Glucose: 84 mg/dl (ref 70–140)
Potassium: 4.1 mEq/L (ref 3.5–5.1)
SODIUM: 139 meq/L (ref 136–145)
TOTAL PROTEIN: 6.6 g/dL (ref 6.4–8.3)
Total Bilirubin: 0.4 mg/dL (ref 0.20–1.20)

## 2014-02-22 NOTE — Progress Notes (Signed)
[facility]  [phone] [phone]) [phone]     ID: [name] DOB: Dec 07, 1935  MR#: [id number]  LKG#:[id number]  Patient Care Team: Cala Bradford, MD as PCP - General PCP: Cala Bradford, MD GYN: SU: Avel Peace MD OTHER MD:C. Lesia Sago MD, Stan Head MD  CHIEF COMPLAINT: Atypical ductal hyperplasia  CURRENT TREATMENT: Observation   BREAST CANCER HISTORY: [name] had a bilateral screening mammography with tomography at the breast Center 11/10/2013. In the right breast there were some calcifications which warranted further evaluation. She had a right diagnostic mammogram 11/22/2013 and this showed an approximately 5 mm area with round and punctate calcifications in the upper outer right breast. These were felt to be unchanged as compared to 2009. Six-month follow-up was recommended.  However the patient opted for biopsy, and this was performed 12/09/2013. The pathology from this procedure(SAA [phone]) showed atypical ductal hyperplasia. The patient was then referred to Dr. Suzzanne Cloud for excision, and this was performed 01/24/2014 after radioactive seed placement the day before. The pathology from her right lumpectomy showed (SZA a 15-04/11/1999) again atypical ductal hyperplasia. This was very focal. Margins were negative for any atypia or malignancy.  The patient's subsequent history is as detailed below.  INTERVAL HISTORY: [name] was evaluated in the high risk genetics clinic 02/22/2014  REVIEW OF SYSTEMS: There were no specific symptoms leading to the screening mammogram, which was routinely scheduled. The patient denies unusual headaches, nausea, vomiting, stiff neck, dizziness, or gait imbalance. There has been no cough, phlegm production, or pleurisy, no chest pain or pressure, and no change in bowel or bladder habits. The patient denies fever, rash, bleeding, unexplained fatigue or unexplained weight loss. Very rarely she will see some  squarely lines from her left eye. This has been ongoing and as stated is very intermittent.  A detailed review of systems was otherwise entirely negative.  PAST MEDICAL HISTORY: Past Medical History  Diagnosis Date  . DYSLIPIDEMIA   . IRON DEFICIENCY ANEMIA SECONDARY TO BLOOD LOSS   . CORONARY ARTERY DISEASE   . Right bundle branch block   . SUPRAVENTRICULAR TACHYCARDIA   . Raynaud's syndrome   . EXTERNAL HEMORRHOIDS   . GERD   . GASTRIC ANTRAL VASCULAR ECTASIA   . ENDOMETRIOSIS   . ACNE ROSACEA   . SJOGREN'S SYNDROME   . OSTEOARTHRITIS   . Rosacea, acne   . COLONIC POLYPS, ADENOMATOUS, HX OF   . HEADACHE, CHRONIC, HX OF   . Headache   . Hypertension   . Insomnia     PAST SURGICAL HISTORY: Past Surgical History  Procedure Laterality Date  . Cholecystectomy    . Bilateral oophorectomy    . Hiatal hernia repair  2005    and paraesophageal  . Esophagogastroduodenoscopy  11/27/2009    APC tretment of lesions  . Colonoscopy w/ biopsies and polypectomy  12/17/2006    adenomatous polyps, external hemorrhoids  . Tear duct surgery      right  . Total abdominal hysterectomy    . Breast lumpectomy      left-neg  . Breast lumpectomy with radioactive seed localization Right 01/24/2014    Procedure: BREAST LUMPECTOMY WITH RADIOACTIVE SEED LOCALIZATION;  Surgeon: Avel Peace, MD;  Location: [facility];  Service: General;  Laterality: Right;    FAMILY HISTORY Family History  Problem Relation Age of Onset  . Heart failure Mother   . Diabetes Mother   . Kidney failure Mother   . Hypertension Mother   .  Coronary artery disease Mother   . Breast cancer Sister     x 3 sisters  . Heart disease Father   . Hypertension Father   . Coronary artery disease Father   . Breast cancer Daughter   . Colon cancer Neg Hx   . Esophageal cancer Neg Hx   . Stomach cancer Neg Hx   . Rectal cancer Neg Hx   . Coronary artery disease Brother   . Coronary artery disease Brother    . Coronary artery disease Brother   . Breast cancer Sister   . Breast cancer Sister    patient's father died from a heart attack at age 29. The patient's mother died in the setting of renal failure at the age of 59. They have 3 brothers, 4 sisters. One of her sisters was diagnosed with breast cancer at the age of 61 and died at the age of 24. A second sister was diagnosed with breast cancer at age 41. In addition the patient's daughter, [name], was diagnosed with breast cancer at age 38. She is now age 54, according to [name], and has cancer of the omentum.  GYNECOLOGIC HISTORY:  No LMP recorded. Patient is postmenopausal. Menarche age 71, first live birth age 52. The patient is GX P3. She underwent a simple hysterectomy at age 35 (no salpingo-oophorectomy). She did not take hormone replacement. 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 Her husband [name] retired at age 15. Daughter [name] used to be a Naval architect for [employer]-. Her cancer*he is listed above. Son [name] is a neurologist in [place]. Daughter [name] teaches gifted children in [place]. The patient has 4 grandchildren. She attends [employer].    ADVANCED DIRECTIVES: In place   HEALTH MAINTENANCE: History  Substance Use Topics  . Smoking status: Never Smoker   . Smokeless tobacco: Never Used  . Alcohol Use: Yes     Comment: occasional// 1 drink a month     Colonoscopy: JAN 2015/ no further colonoscopies planned  PAP: s/p hysterectomy  Bone density:  Lipid panel:  Allergies  Allergen Reactions  . Ezetimibe     Muscle pain= zetia  . Ezetimibe-Simvastatin     Muscle pain= vytorin  . Morphine     nightmares  . Rosuvastatin     Muscle pain  . Ambien [Zolpidem Tartrate] Other (See Comments)    Fell down  . Plaquenil [Hydroxychloroquine Sulfate] Other (See Comments)  . Polytrim [Polymyxin  B-Trimethoprim] Other (See Comments)    unknown  . Prevacid [Lansoprazole] Other (See Comments)    dizziness    Current Outpatient Prescriptions  Medication Sig Dispense Refill  . amLODipine (NORVASC) 5 MG tablet Take 5 mg by mouth daily.     . Biotin 10 MG CAPS Take 10 mg by mouth daily.    . calcium citrate-vitamin D (CITRACAL+D) 315-200 MG-UNIT per tablet Take 1 tablet by mouth daily.    . Capsicum, Cayenne, (CAYENNE PO) Take 1 capsule by mouth daily as needed (for stomach).     . Cholecalciferol (VITAMIN D3) 2000 UNITS TABS Take 1 capsule by mouth daily.    . Coenzyme Q10 (COQ-10 PO) Take 1 tablet by mouth daily as needed (for vitamin).     . Evening Primrose Oil 500 MG CAPS Take 500 mg by mouth daily.    . ferrous sulfate 325 (65 FE) MG tablet Take 325 mg by mouth daily with breakfast.    .  Flaxseed, Linseed, (FLAX SEEDS PO) Take 1 tablet by mouth daily.     . Ginger, Zingiber officinalis, (GINGER ROOT) 250 MG CAPS Take 250 mg by mouth daily.    [name] glucosamine-chondroitin 500-400 MG tablet Take 1 tablet by mouth daily.     [name] ketotifen (REFRESH EYE ITCH RELIEF) 0.025 % ophthalmic solution Place 2 drops into both eyes 2 (two) times daily as needed (for dry eyes).    . Multiple Vitamin (MULTIVITAMIN) capsule Take 1 capsule by mouth daily.     . temazepam (RESTORIL) 15 MG capsule Take 15 mg by mouth at bedtime as needed. SLEEP AID    . TURMERIC CURCUMIN PO Take 1 tablet by mouth daily.     . nitroGLYCERIN (NITROSTAT) 0.4 MG SL tablet Place 1 tablet (0.4 mg total) under the tongue every 5 (five) minutes as needed for chest pain. 30 tablet 1  . Nutritional Supplements (GRAPESEED EXTRACT PO) Take 1 tablet by mouth daily.      No current facility-administered medications for this visit.    OBJECTIVE: Older white woman who appears well Filed Vitals:   02/22/14 1620  BP: 155/66  Pulse: 70  Temp: 97.9 F (36.6 C)  Resp: 18     Body mass index is 22.93 kg/(m^2).    ECOG FS:0 -  Asymptomatic  Ocular: Sclerae unicteric, pupils round and equal Ear-nose-throat: Oropharynx clear and moist Lymphatic: No cervical or supraclavicular adenopathy Lungs no rales or rhonchi, good excursion bilaterally Heart regular rate and rhythm, no murmur appreciated Abd soft, nontender, positive bowel sounds MSK no focal spinal tenderness, no joint edema Neuro: non-focal, well-oriented, appropriate affect Breasts: The right breast is status post recent lumpectomy. The incision is healing nicely, with no evidence of dehiscence, swelling, or erythema. The right axilla is benign per the left breast is unremarkable.   LAB RESULTS:  CMP     Component Value Date/Time   NA 139 02/22/2014 1600   NA 138 01/20/2014 1510   K 4.1 02/22/2014 1600   K 5.0 01/20/2014 1510   CL 99 01/20/2014 1510   CO2 24 02/22/2014 1600   CO2 25 01/20/2014 1510   GLUCOSE 84 02/22/2014 1600   GLUCOSE 95 01/20/2014 1510   BUN 18.1 02/22/2014 1600   BUN 19 01/20/2014 1510   CREATININE 1.0 02/22/2014 1600   CREATININE 0.99 01/20/2014 1510   CALCIUM 10.0 02/22/2014 1600   CALCIUM 10.1 01/20/2014 1510   PROT 6.6 02/22/2014 1600   PROT 7.6 01/20/2014 1510   ALBUMIN 3.9 02/22/2014 1600   ALBUMIN 4.1 01/20/2014 1510   AST 21 02/22/2014 1600   AST 29 01/20/2014 1510   ALT 15 02/22/2014 1600   ALT 16 01/20/2014 1510   ALKPHOS 62 02/22/2014 1600   ALKPHOS 68 01/20/2014 1510   BILITOT 0.40 02/22/2014 1600   BILITOT 0.4 01/20/2014 1510   GFRNONAA 54* 01/20/2014 1510   GFRAA 63* 01/20/2014 1510    INo results found for: SPEP, UPEP  Lab Results  Component Value Date   WBC 5.2 02/22/2014   NEUTROABS 3.2 02/22/2014   HGB 11.8 02/22/2014   HCT 38.3 02/22/2014   MCV 86.7 02/22/2014   PLT 205 02/22/2014      Chemistry      Component Value Date/Time   NA 139 02/22/2014 1600   NA 138 01/20/2014 1510   K 4.1 02/22/2014 1600   K 5.0 01/20/2014 1510   CL 99 01/20/2014 1510   CO2 24 02/22/2014 1600   CO2  25 01/20/2014 1510   BUN 18.1 02/22/2014 1600   BUN 19 01/20/2014 1510   CREATININE 1.0 02/22/2014 1600   CREATININE 0.99 01/20/2014 1510      Component Value Date/Time   CALCIUM 10.0 02/22/2014 1600   CALCIUM 10.1 01/20/2014 1510   ALKPHOS 62 02/22/2014 1600   ALKPHOS 68 01/20/2014 1510   AST 21 02/22/2014 1600   AST 29 01/20/2014 1510   ALT 15 02/22/2014 1600   ALT 16 01/20/2014 1510   BILITOT 0.40 02/22/2014 1600   BILITOT 0.4 01/20/2014 1510       No results found for: LABCA2  No components found for: LABCA125  No results for input(s): INR in the last 168 hours.  Urinalysis    Component Value Date/Time   COLORURINE YELLOW 03/15/2009 1800   APPEARANCEUR CLEAR 03/15/2009 1800   LABSPEC 1.013 03/15/2009 1800   PHURINE 6.0 03/15/2009 1800   GLUCOSEU NEGATIVE 03/15/2009 1800   HGBUR NEGATIVE 03/15/2009 1800   BILIRUBINUR NEGATIVE 03/15/2009 1800   KETONESUR NEGATIVE 03/15/2009 1800   PROTEINUR NEGATIVE 03/15/2009 1800   UROBILINOGEN 0.2 03/15/2009 1800   NITRITE NEGATIVE 03/15/2009 1800   LEUKOCYTESUR SMALL* 03/15/2009 1800    STUDIES: Mm Breast Surgical Specimen  01/24/2014   CLINICAL DATA:  Surgical excision for atypical ductal hyperplasia of right breast.  EXAM: SPECIMEN RADIOGRAPH OF THE RIGHT BREAST  COMPARISON:  Previous exam(s)  FINDINGS: Status post excision of the right breast. The radioactive seed and biopsy marker clip are present, completely intact, and are marked for pathology.  IMPRESSION: Specimen radiograph of the right breast.   Electronically Signed   By: Sherian Rein M.D.   On: 01/24/2014 13:19    ASSESSMENT: 78 y.o. [place] woman status post right lumpectomy 01/24/2014 for atypical ductal hyperplasia.  (1) there is a strong family history for breast and omental cancer  PLAN: I spent approximately one hour today with [name] the meaning of atypical ductal hyperplasia. She understands that this is not a type of cancer. It may be a  cancer precursor but is not necessarily that. It does raise her risk of developing breast cancer.  However she has 2 sisters with breast cancer as well as a daughter with breast cancer, 2 of them with breast cancer before the age of 49. Accordingly Mahagony's risk of developing breast cancer from family history alone is sufficiently high that she should consider tamoxifen for chemoprevention.  Accordingly we discussed the possible toxicities, side effects and complications of tamoxifen in detail. She was not able to make up her mind today and will let me know after she goes over the information (which was given to her in writing) with more leisure  More importantly, I have made requested an appointment for her with our genetics counselor. If her daughter who had breast cancer at the age of 81 could come with her, that would be even better. It is important for this family to identify the genetic mutation, if possible, so that other family members can be tested and if positive can take the appropriate precautions  Tentatively I have made that he a return appointment with me in approximately 3 months. She knows to call for any other problems that may develop before her next visit here.  The patient has a good understanding of the overall plan. She agrees with it. She knows the goal of treatment in her case is cure. She will call with any problems that may develop before her next visit here.  Lowella DellMAGRINAT,Dezmen Alcock C, MD   02/22/2014 5:30 PM

## 2014-02-23 ENCOUNTER — Telehealth: Payer: Self-pay | Admitting: Oncology

## 2014-02-23 ENCOUNTER — Telehealth: Payer: Self-pay | Admitting: *Deleted

## 2014-02-23 NOTE — Telephone Encounter (Signed)
Received an email from Dr. Darnelle Catalan to schedule [name] for genetics.  Called and left a message for the [name] to return my call so I can schedule her.

## 2014-03-06 ENCOUNTER — Telehealth: Payer: Self-pay | Admitting: *Deleted

## 2014-03-06 NOTE — Telephone Encounter (Signed)
Left message for pt to return my call so I can schedule her for genetics.

## 2014-03-15 ENCOUNTER — Telehealth: Payer: Self-pay | Admitting: *Deleted

## 2014-03-15 NOTE — Telephone Encounter (Signed)
Received a request from Dr. Darnelle Catalan to schedule pt for genetics.  Called pt and she stated that she does not need the test completed right now and as for the daughter Dr. Darnelle Catalan wanted to have tested as well, she's a pt of Dr. Alver Fisher and the pt(mom) feels that Dr. Darnelle Catalan should have Dr. Clelia Croft talk to her daughter about that test.  Emailed Dr. Darnelle Catalan to make him aware.

## 2014-06-13 ENCOUNTER — Encounter: Payer: Self-pay | Admitting: Cardiovascular Disease

## 2014-06-13 ENCOUNTER — Ambulatory Visit (INDEPENDENT_AMBULATORY_CARE_PROVIDER_SITE_OTHER): Payer: Medicare Other | Admitting: Cardiovascular Disease

## 2014-06-13 VITALS — BP 124/66 | HR 69 | Ht 63.0 in | Wt 130.8 lb

## 2014-06-13 DIAGNOSIS — I2510 Atherosclerotic heart disease of native coronary artery without angina pectoris: Secondary | ICD-10-CM

## 2014-06-13 NOTE — Patient Instructions (Signed)
Your physician recommends that you continue on your current medications as directed. Please refer to the Current Medication list given to you today.  Your physician wants you to follow-up in: 1 year with Dr. Cooper.  You will receive a reminder letter in the mail two months in advance. If you don't receive a letter, please call our office to schedule the follow-up appointment.   

## 2014-06-13 NOTE — Progress Notes (Signed)
Cardiology Office Note   Date:  06/13/2014   ID:  [name], DOB Aug 03, 1935, MRN [id number]  PCP:  Cala Bradford, MD  Cardiologist:  Tonny Bollman, MD    Chief Complaint  Patient presents with  . Fatigue    History of Present Illness: [name] is a 79 y.o. female who presents for follow-up of CAD. Cardiac catheterization in 2006 showed mild-moderate CAD and medical Rx was recommended. She has preserved left ventricular systolic function. Last echocardiogram in 2012 showed normal LV systolic function with mild to moderate aortic insufficiency and moderate tricuspid regurgitation. She's had chronic iron deficiency anemia and has not been treated with antiplatelet therapy. She's also been intolerant to statin drugs. She was evaluated for chest pain in 2015 and a Myoview scan was performed showing no significant ischemia.   She complains of marked fatigue. Also has exercise intolerance related to fatigue and feeling like her heart is racing. She's had a 'slight pain' in the center of her chest described as 'dull' and lasts only a few seconds.  She denies any exertional chest pain or pressure. She denies exertional dyspnea.  She's scheduled for a sleep study to evaluate for obstructive sleep apnea as a potential cause of fatigue.   Past Medical History  Diagnosis Date  . DYSLIPIDEMIA   . IRON DEFICIENCY ANEMIA SECONDARY TO BLOOD LOSS   . CORONARY ARTERY DISEASE   . Right bundle branch block   . SUPRAVENTRICULAR TACHYCARDIA   . Raynaud's syndrome   . EXTERNAL HEMORRHOIDS   . GERD   . GASTRIC ANTRAL VASCULAR ECTASIA   . ENDOMETRIOSIS   . ACNE ROSACEA   . SJOGREN'S SYNDROME   . OSTEOARTHRITIS   . Rosacea, acne   . COLONIC POLYPS, ADENOMATOUS, HX OF   . HEADACHE, CHRONIC, HX OF   . Headache   . Hypertension   . Insomnia     Past Surgical History  Procedure Laterality Date  . Cholecystectomy    . Bilateral oophorectomy    . Hiatal hernia repair  2005    and  paraesophageal  . Esophagogastroduodenoscopy  11/27/2009    APC tretment of lesions  . Colonoscopy w/ biopsies and polypectomy  12/17/2006    adenomatous polyps, external hemorrhoids  . Tear duct surgery      right  . Total abdominal hysterectomy    . Breast lumpectomy      left-neg  . Breast lumpectomy with radioactive seed localization Right 01/24/2014    Procedure: BREAST LUMPECTOMY WITH RADIOACTIVE SEED LOCALIZATION;  Surgeon: Avel Peace, MD;  Location: [facility];  Service: General;  Laterality: Right;    Current Outpatient Prescriptions  Medication Sig Dispense Refill  . amLODipine (NORVASC) 5 MG tablet Take 5 mg by mouth daily.     . Biotin 10 MG CAPS Take 10 mg by mouth daily.    . calcium citrate-vitamin D (CITRACAL+D) 315-200 MG-UNIT per tablet Take 1 tablet by mouth daily.    . Capsicum, Cayenne, (CAYENNE PO) Take 1 capsule by mouth daily as needed (for stomach).     . Cholecalciferol (VITAMIN D3) 2000 UNITS TABS Take 1 capsule by mouth daily.    . Coenzyme Q10 (COQ-10 PO) Take 1 tablet by mouth daily as needed (for vitamin).     . Evening Primrose Oil 500 MG CAPS Take 500 mg by mouth daily.    . ferrous sulfate 325 (65 FE) MG tablet Take 325 mg by mouth daily with breakfast.    .  Flaxseed, Linseed, (FLAX SEEDS PO) Take 1 tablet by mouth daily.     . fluticasone (FLONASE) 50 MCG/ACT nasal spray Place 2 sprays into both nostrils daily.    . Ginger, Zingiber officinalis, (GINGER ROOT) 250 MG CAPS Take 250 mg by mouth daily.    [name] glucosamine-chondroitin 500-400 MG tablet Take 1 tablet by mouth daily.     [name] ketotifen (REFRESH EYE ITCH RELIEF) 0.025 % ophthalmic solution Place 2 drops into both eyes 2 (two) times daily as needed (for dry eyes).    . Multiple Vitamin (MULTIVITAMIN) capsule Take 1 capsule by mouth daily.     . nitroGLYCERIN (NITROSTAT) 0.4 MG SL tablet Place 1 tablet (0.4 mg total) under the tongue every 5 (five) minutes as needed for chest pain.  30 tablet 1  . Nutritional Supplements (GRAPESEED EXTRACT PO) Take 1 tablet by mouth daily.     . temazepam (RESTORIL) 15 MG capsule Take 15 mg by mouth at bedtime as needed for sleep. SLEEP AID     No current facility-administered medications for this visit.    Allergies:   Ezetimibe; Ezetimibe-simvastatin; Morphine; Rosuvastatin; Ambien; Plaquenil; Polytrim; and Prevacid   Social History:  The patient  reports that she has never smoked. She has never used smokeless tobacco. She reports that she drinks alcohol. She reports that she does not use illicit drugs.   Family History:  The patient's family history includes Breast cancer in her daughter, sister, sister, and sister; Coronary artery disease in her brother, brother, brother, father, and mother; Diabetes in her mother; Heart disease in her father; Heart failure in her mother; Hypertension in her father and mother; Kidney failure in her mother. There is no history of Colon cancer, Esophageal cancer, Stomach cancer, or Rectal cancer.   ROS:  Please see the history of present illness.  Otherwise, review of systems is positive for hearing loss, back pain, muscle pain, irregular heart beats, snoring.  All other systems are reviewed and negative.   PHYSICAL EXAM: VS:  BP 124/66 mmHg  Pulse 69  Ht 5\' 3"  (1.6 m)  Wt 130 lb 12.8 oz (59.33 kg)  BMI 23.18 kg/m2 , BMI Body mass index is 23.18 kg/(m^2). GEN: Well nourished, well developed, in no acute distress HEENT: normal Neck: no JVD, no masses. No carotid bruits Cardiac: RRR without murmur or gallop                Respiratory:  clear to auscultation bilaterally, normal work of breathing GI: soft, nontender, nondistended, + BS MS: no deformity or atrophy Ext: no pretibial edema, pedal pulses 2+= bilaterally Skin: warm and dry, no rash Neuro:  Strength and sensation are intact Psych: euthymic mood, full affect  EKG:  EKG is ordered today. The ekg ordered today shows sinus rhythm 69 bpm,  within normal limits.  Recent Labs: 02/22/2014: ALT 15; BUN 18.1; Creatinine 1.0; Hemoglobin 11.8; Platelets 205; Potassium 4.1; Sodium 139   Lipid Panel     Component Value Date/Time   CHOL  03/16/2009 0210    172        ATP III CLASSIFICATION:  <200     mg/dL   Desirable  355-732  mg/dL   Borderline High  >=202    mg/dL   High          TRIG 542 03/16/2009 0210   HDL 59 03/16/2009 0210   CHOLHDL 2.9 03/16/2009 0210   VLDL 22 03/16/2009 0210   LDLCALC  03/16/2009 0210  91        Total Cholesterol/HDL:CHD Risk Coronary Heart Disease Risk Table                     Men   Women  1/2 Average Risk   3.4   3.3  Average Risk       5.0   4.4  2 X Average Risk   9.6   7.1  3 X Average Risk  23.4   11.0        Use the calculated Patient Ratio above and the CHD Risk Table to determine the patient's CHD Risk.        ATP III CLASSIFICATION (LDL):  <100     mg/dL   Optimal  409-811  mg/dL   Near or Above                    Optimal  130-159  mg/dL   Borderline  914-782  mg/dL   High  >956     mg/dL   Very High      Wt Readings from Last 3 Encounters:  06/13/14 130 lb 12.8 oz (59.33 kg)  02/22/14 129 lb 6.4 oz (58.695 kg)  01/24/14 130 lb (58.968 kg)     Cardiac Studies Reviewed: Lexiscan Myoview 08/2013: Overall Impression: Normal stress nuclear study.  LV Ejection Fraction: 69%. LV Wall Motion: NL LV Function; NL Wall Motion  ASSESSMENT AND PLAN: 1.  CAD, native vessel, without symptoms of angina. The patient's nuclear scan from last year was reviewed and this demonstrated no ischemia and normal LV systolic function. I do not think her fatigue is related to heart disease. She will continue her current medical program. I agree that a Sleep Study is indicated to evaluate her fatigue. She admits depression may be playing a role.  2. Hyperlipidemia: The patient is intolerant to statin drugs as well as Zetia. She will continue with lifestyle measures.  3. Essential  hypertension: Blood pressure is well controlled on amlodipine   Current medicines are reviewed with the patient today.  The patient does not have concerns regarding medicines.  The following changes have been made:  no change  Labs/ tests ordered today include:   Orders Placed This Encounter  Procedures  . EKG 12-Lead   Disposition:   FU one year  Signed, Tonny Bollman, MD  06/13/2014 11:57 AM    [facility] [address] [place], [place], [place]  [address] Phone: [phone]; Fax: [phone]

## 2014-06-26 ENCOUNTER — Telehealth: Payer: Self-pay | Admitting: Oncology

## 2014-06-26 NOTE — Telephone Encounter (Signed)
pt daughter cld to CX appt-no r/s @ this time Nicole Cella)

## 2014-06-29 ENCOUNTER — Ambulatory Visit: Payer: Medicare Other | Admitting: Oncology

## 2014-07-05 ENCOUNTER — Telehealth: Payer: Self-pay | Admitting: Oncology

## 2014-07-05 NOTE — Telephone Encounter (Signed)
pt cld to r/s appt-gave pt updated time & date °

## 2014-07-24 ENCOUNTER — Ambulatory Visit (HOSPITAL_BASED_OUTPATIENT_CLINIC_OR_DEPARTMENT_OTHER): Payer: Medicare Other | Admitting: Oncology

## 2014-07-24 ENCOUNTER — Encounter: Payer: Self-pay | Admitting: Oncology

## 2014-07-24 VITALS — BP 117/48 | HR 72 | Temp 98.0°F | Resp 16 | Wt 131.2 lb

## 2014-07-24 DIAGNOSIS — N6091 Unspecified benign mammary dysplasia of right breast: Secondary | ICD-10-CM

## 2014-07-24 DIAGNOSIS — Z803 Family history of malignant neoplasm of breast: Secondary | ICD-10-CM

## 2014-07-24 DIAGNOSIS — N62 Hypertrophy of breast: Secondary | ICD-10-CM

## 2014-07-24 NOTE — Progress Notes (Signed)
[facility]  [phone] [phone]     ID: ANALUZ OH DOB: 01-15-1936  MR#: [id number]  GQB#:[id number]  Patient Care Team: Laurann Montana, MD as PCP - General PCP: Cala Bradford, MD GYN: SU: Avel Peace MD OTHER MD:C. Lesia Sago MD, Stan Head MD  CHIEF COMPLAINT: Atypical ductal hyperplasia  CURRENT TREATMENT: Observation   BREAST CANCER HISTORY: Jasmane had a bilateral screening mammography with tomography at the breast Center 11/10/2013. In the right breast there were some calcifications which warranted further evaluation. She had a right diagnostic mammogram 11/22/2013 and this showed an approximately 5 mm area with round and punctate calcifications in the upper outer right breast. These were felt to be unchanged as compared to 2009. Six-month follow-up was recommended.  However the patient opted for biopsy, and this was performed 12/09/2013. The pathology from this procedure(SAA [phone]) showed atypical ductal hyperplasia. The patient was then referred to Dr. Suzzanne Cloud for excision, and this was performed 01/24/2014 after radioactive seed placement the day before. The pathology from her right lumpectomy showed (SZA a 15-04/11/1999) again atypical ductal hyperplasia. This was very focal. Margins were negative for any atypia or malignancy.  The patient's subsequent history is as detailed below.  INTERVAL HISTORY: Maryke was evaluated in the high risk genetics clinic 02/22/2014. She was given information about Tamoxifen at that time. SHe is not sure why she is here to follow-up today.  REVIEW OF SYSTEMS: The patient feels well overall. Has mild fatigue that does not interfere with her activities. The patient denies unusual headaches, nausea, vomiting, stiff neck, dizziness, or gait imbalance. There has been no cough, phlegm production, or pleurisy, no chest pain or pressure, and no change in bowel or bladder habits. The patient denies  fever, rash, bleeding, unexplained fatigue or unexplained weight loss. No lumps or changes to her breasts. A detailed review of systems was otherwise entirely negative.  PAST MEDICAL HISTORY: Past Medical History  Diagnosis Date  . DYSLIPIDEMIA   . IRON DEFICIENCY ANEMIA SECONDARY TO BLOOD LOSS   . CORONARY ARTERY DISEASE   . Right bundle branch block   . SUPRAVENTRICULAR TACHYCARDIA   . Raynaud's syndrome   . EXTERNAL HEMORRHOIDS   . GERD   . GASTRIC ANTRAL VASCULAR ECTASIA   . ENDOMETRIOSIS   . ACNE ROSACEA   . SJOGREN'S SYNDROME   . OSTEOARTHRITIS   . Rosacea, acne   . COLONIC POLYPS, ADENOMATOUS, HX OF   . HEADACHE, CHRONIC, HX OF   . Headache   . Hypertension   . Insomnia     PAST SURGICAL HISTORY: Past Surgical History  Procedure Laterality Date  . Cholecystectomy    . Bilateral oophorectomy    . Hiatal hernia repair  2005    and paraesophageal  . Esophagogastroduodenoscopy  11/27/2009    APC tretment of lesions  . Colonoscopy w/ biopsies and polypectomy  12/17/2006    adenomatous polyps, external hemorrhoids  . Tear duct surgery      right  . Total abdominal hysterectomy    . Breast lumpectomy      left-neg  . Breast lumpectomy with radioactive seed localization Right 01/24/2014    Procedure: BREAST LUMPECTOMY WITH RADIOACTIVE SEED LOCALIZATION;  Surgeon: Avel Peace, MD;  Location: [facility];  Service: General;  Laterality: Right;    FAMILY HISTORY Family History  Problem Relation Age of Onset  . Heart failure Mother   . Diabetes Mother   . Kidney failure Mother   .  Hypertension Mother   . Coronary artery disease Mother   . Breast cancer Sister     x 3 sisters  . Heart disease Father   . Hypertension Father   . Coronary artery disease Father   . Breast cancer Daughter   . Colon cancer Neg Hx   . Esophageal cancer Neg Hx   . Stomach cancer Neg Hx   . Rectal cancer Neg Hx   . Coronary artery disease Brother   . Coronary artery  disease Brother   . Coronary artery disease Brother   . Breast cancer Sister   . Breast cancer Sister    patient's father died from a heart attack at age 51. The patient's mother died in the setting of renal failure at the age of 68. They have 3 brothers, 4 sisters. One of her sisters was diagnosed with breast cancer at the age of 70 and died at the age of 26. A second sister was diagnosed with breast cancer at age 74. In addition the patient's daughter, Jeralene Peters, was diagnosed with breast cancer at age 36. She is now age 27, according to Joreen, and has cancer of the omentum.  GYNECOLOGIC HISTORY:  No LMP recorded. Patient is postmenopausal. Menarche age 9, first live birth age 63. The patient is GX P3. She underwent a simple hysterectomy at age 30 (no salpingo-oophorectomy). She did not take hormone replacement. 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 Her husband Iantha Fallen retired at age 6. Daughter Wapak[place]eta Blas used to be a Naval architect for [employer]-. Her cancer*he is listed above. Son Anquanette Bahner is a neurologist in [place]. Daughter Gwyndolyn Kaufman teaches gifted children in [place]. The patient has 4 grandchildren. She attends [employer].    ADVANCED DIRECTIVES: In place   HEALTH MAINTENANCE: History  Substance Use Topics  . Smoking status: Never Smoker   . Smokeless tobacco: Never Used  . Alcohol Use: Yes     Comment: occasional// 1 drink a month     Colonoscopy: JAN 2015/ no further colonoscopies planned  PAP: s/p hysterectomy  Bone density:  Lipid panel:  Allergies  Allergen Reactions  . Ezetimibe Other (See Comments)    Muscle pain= zetia  . Ezetimibe-Simvastatin Other (See Comments)    Muscle pain= vytorin  . Morphine Other (See Comments)    nightmares  . Rosuvastatin Other (See Comments)    Muscle pain  . Ambien [Zolpidem Tartrate] Other (See Comments)    Fell down  .  Plaquenil [Hydroxychloroquine Sulfate] Other (See Comments)  . Polytrim [Polymyxin B-Trimethoprim] Other (See Comments)    unknown  . Prevacid [Lansoprazole] Other (See Comments)    dizziness    Current Outpatient Prescriptions  Medication Sig Dispense Refill  . amLODipine (NORVASC) 5 MG tablet Take 5 mg by mouth daily.     . Biotin 10 MG CAPS Take 10 mg by mouth daily.    . calcium citrate-vitamin D (CITRACAL+D) 315-200 MG-UNIT per tablet Take 1 tablet by mouth daily.    . Capsicum, Cayenne, (CAYENNE PO) Take 1 capsule by mouth daily as needed (for stomach).     . Cholecalciferol (VITAMIN D3) 2000 UNITS TABS Take 1 capsule by mouth daily.    . Coenzyme Q10 (COQ-10 PO) Take 1 tablet by mouth daily as needed (for vitamin).     . Evening Primrose Oil 500 MG CAPS Take 500 mg by mouth daily.    . ferrous sulfate  325 (65 FE) MG tablet Take 325 mg by mouth daily with breakfast.    . Flaxseed, Linseed, (FLAX SEEDS PO) Take 1 tablet by mouth daily.     . fluticasone (FLONASE) 50 MCG/ACT nasal spray Place 2 sprays into both nostrils daily.    . Ginger, Zingiber officinalis, (GINGER ROOT) 250 MG CAPS Take 250 mg by mouth daily.    [name] glucosamine-chondroitin 500-400 MG tablet Take 1 tablet by mouth daily.     [name] ketotifen (REFRESH EYE ITCH RELIEF) 0.025 % ophthalmic solution Place 2 drops into both eyes 2 (two) times daily as needed (for dry eyes).    . Multiple Vitamin (MULTIVITAMIN) capsule Take 1 capsule by mouth daily.     . nitroGLYCERIN (NITROSTAT) 0.4 MG SL tablet Place 1 tablet (0.4 mg total) under the tongue every 5 (five) minutes as needed for chest pain. 30 tablet 1  . Nutritional Supplements (GRAPESEED EXTRACT PO) Take 1 tablet by mouth daily.     . temazepam (RESTORIL) 15 MG capsule Take 15 mg by mouth at bedtime as needed for sleep. SLEEP AID     No current facility-administered medications for this visit.    OBJECTIVE: Older white woman who appears well Filed Vitals:   07/24/14 1355   BP: 117/48  Pulse: 72  Temp: 98 F (36.7 C)  Resp: 16     Body mass index is 23.24 kg/(m^2).    ECOG FS:0 - Asymptomatic  Ocular: Sclerae unicteric, pupils round and equal Ear-nose-throat: Oropharynx clear and moist Lymphatic: No cervical or supraclavicular adenopathy Lungs no rales or rhonchi, good excursion bilaterally Heart regular rate and rhythm, no murmur appreciated Abd soft, nontender, positive bowel sounds MSK no focal spinal tenderness, no joint edema Neuro: non-focal, well-oriented, appropriate affect Breasts: Deferred.   LAB RESULTS:  CMP     Component Value Date/Time   NA 139 02/22/2014 1600   NA 138 01/20/2014 1510   K 4.1 02/22/2014 1600   K 5.0 01/20/2014 1510   CL 99 01/20/2014 1510   CO2 24 02/22/2014 1600   CO2 25 01/20/2014 1510   GLUCOSE 84 02/22/2014 1600   GLUCOSE 95 01/20/2014 1510   BUN 18.1 02/22/2014 1600   BUN 19 01/20/2014 1510   CREATININE 1.0 02/22/2014 1600   CREATININE 0.99 01/20/2014 1510   CALCIUM 10.0 02/22/2014 1600   CALCIUM 10.1 01/20/2014 1510   PROT 6.6 02/22/2014 1600   PROT 7.6 01/20/2014 1510   ALBUMIN 3.9 02/22/2014 1600   ALBUMIN 4.1 01/20/2014 1510   AST 21 02/22/2014 1600   AST 29 01/20/2014 1510   ALT 15 02/22/2014 1600   ALT 16 01/20/2014 1510   ALKPHOS 62 02/22/2014 1600   ALKPHOS 68 01/20/2014 1510   BILITOT 0.40 02/22/2014 1600   BILITOT 0.4 01/20/2014 1510   GFRNONAA 54* 01/20/2014 1510   GFRAA 63* 01/20/2014 1510    INo results found for: SPEP, UPEP  Lab Results  Component Value Date   WBC 5.2 02/22/2014   NEUTROABS 3.2 02/22/2014   HGB 11.8 02/22/2014   HCT 38.3 02/22/2014   MCV 86.7 02/22/2014   PLT 205 02/22/2014      Chemistry      Component Value Date/Time   NA 139 02/22/2014 1600   NA 138 01/20/2014 1510   K 4.1 02/22/2014 1600   K 5.0 01/20/2014 1510   CL 99 01/20/2014 1510   CO2 24 02/22/2014 1600   CO2 25 01/20/2014 1510   BUN 18.1 02/22/2014 1600  BUN 19 01/20/2014 1510    CREATININE 1.0 02/22/2014 1600   CREATININE 0.99 01/20/2014 1510      Component Value Date/Time   CALCIUM 10.0 02/22/2014 1600   CALCIUM 10.1 01/20/2014 1510   ALKPHOS 62 02/22/2014 1600   ALKPHOS 68 01/20/2014 1510   AST 21 02/22/2014 1600   AST 29 01/20/2014 1510   ALT 15 02/22/2014 1600   ALT 16 01/20/2014 1510   BILITOT 0.40 02/22/2014 1600   BILITOT 0.4 01/20/2014 1510       No results found for: LABCA2  No components found for: LABCA125  No results for input(s): INR in the last 168 hours.  Urinalysis    Component Value Date/Time   COLORURINE YELLOW 03/15/2009 1800   APPEARANCEUR CLEAR 03/15/2009 1800   LABSPEC 1.013 03/15/2009 1800   PHURINE 6.0 03/15/2009 1800   GLUCOSEU NEGATIVE 03/15/2009 1800   HGBUR NEGATIVE 03/15/2009 1800   BILIRUBINUR NEGATIVE 03/15/2009 1800   KETONESUR NEGATIVE 03/15/2009 1800   PROTEINUR NEGATIVE 03/15/2009 1800   UROBILINOGEN 0.2 03/15/2009 1800   NITRITE NEGATIVE 03/15/2009 1800   LEUKOCYTESUR SMALL* 03/15/2009 1800    STUDIES: No results found.  ASSESSMENT: 79 y.o. [place] woman status post right lumpectomy 01/24/2014 for atypical ductal hyperplasia.  (1) there is a strong family history for breast and omental cancer  PLAN: I have again discussed with Pooja the meaning of atypical ductal hyperplasia. She understands that this is not a type of cancer. It may be a cancer precursor but is not necessarily that. It does raise her risk of developing breast cancer.  However she has 2 sisters with breast cancer as well as a daughter with breast cancer, 2 of them with breast cancer before the age of 87. Accordingly Karia's risk of developing breast cancer from family history alone is sufficiently high that she should consider tamoxifen for chemoprevention.  I have again reviewed the possible toxicities, side effects and complications of tamoxifen in detail. She was not able to make up her mind today. I have given her written patient  information for her review. She wishes to discuss this further with her PCP at her next visit.   She has opted not to schedule a follow-up appointment in our office. She will call us back if she wishes to proceed with Tamoxifen.  The patient has a good understanding of the overall plan. She agrees with it. She knows the goal of treatment in her case is cure. She will call with any problems.  Clenton Pare, NP   07/24/2014 2:30 PM

## 2014-07-25 ENCOUNTER — Telehealth: Payer: Self-pay | Admitting: Cardiovascular Disease

## 2014-07-25 NOTE — Telephone Encounter (Signed)
Pt c/o BP issue: STAT if pt c/o blurred vision, one-sided weakness or slurred speech  1. What are your last 5 BP readings? Last night at 7:15pm BP161/78 P137 in Left arm, 7:19pm BP163/96 P78 in Right arm, 8pm BP150/77 P71 in Right arm, 11:33pm BP157/78 P78 in Left arm, 11:43pm BP130/79 P83 in Right arm, This morning 7:30am BP120/67 in Right arm, 7:30am BP116/74 P72 in Left arm  2. Are you having any other symptoms (ex. Dizziness, headache, blurred vision, passed out)? Numbness on left side from her toes all the way to her shoulder  3. What is your BP issue? Couldn't get a bp reading at home and when she went to the cancer center they weren't able to get a bp reading on her left side either, pt states that she is able to get a bp when taking it on her right side but needs to see someone about this. Please call back and advise.

## 2014-07-25 NOTE — Telephone Encounter (Signed)
Called patient back. She and her son feel she needs to see a doctor today because they were unable to get her BP in her left arm yesterday at her appointment at the cancer center.  Patient has reported BP readings below in both left and right arms, taken at home.  She reports currently she has tingling in her toes, she relates this to sciatica.  Her fingers are also numb this morning, she reports they often are tingling, and are always cold due to Raynauds.  Has usual ROM and no noted significant weakness to the left side.  She would like to see Dr. Excell Seltzer today or her PCP. Discussed with Dr. Excell Seltzer, after reviewing her chart he rec she contact PCP. Pt verbalizes understanding and appreciation for the call back.

## 2014-07-30 ENCOUNTER — Encounter (HOSPITAL_BASED_OUTPATIENT_CLINIC_OR_DEPARTMENT_OTHER): Payer: Self-pay

## 2014-07-31 ENCOUNTER — Other Ambulatory Visit: Payer: Self-pay | Admitting: *Deleted

## 2014-07-31 DIAGNOSIS — I6523 Occlusion and stenosis of bilateral carotid arteries: Secondary | ICD-10-CM

## 2014-08-01 ENCOUNTER — Ambulatory Visit (INDEPENDENT_AMBULATORY_CARE_PROVIDER_SITE_OTHER): Payer: Medicare Other | Admitting: Neurology

## 2014-08-01 ENCOUNTER — Encounter: Payer: Self-pay | Admitting: Neurology

## 2014-08-01 VITALS — BP 120/61 | HR 64 | Ht 63.0 in | Wt 132.4 lb

## 2014-08-01 DIAGNOSIS — R202 Paresthesia of skin: Secondary | ICD-10-CM | POA: Diagnosis not present

## 2014-08-01 HISTORY — DX: Paresthesia of skin: R20.2

## 2014-08-01 NOTE — Progress Notes (Signed)
Reason for visit: Left-sided numbness  Referring physician: Dr. Laurann Montana  [name] is a 79 y.o. female  History of present illness:  [name] is a 79 year old right-handed white female with a history of cerebrovascular disease. The patient has developed some intermittent episodes of left-sided numbness affecting the left leg, and going to the left arm. The patient denies any numbness on the left face. She has some sensation of imbalance with the numbness, but she has not had any falls. The patient denies any other associated symptoms of headache, visual loss, slurred speech, or memory problems. The patient has almost daily episodes that come and go. The patient has had increasing problems over the last 3 months. She is sent to this office for further evaluation. She has started low-dose aspirin at this time.  Past Medical History  Diagnosis Date  . DYSLIPIDEMIA   . IRON DEFICIENCY ANEMIA SECONDARY TO BLOOD LOSS   . CORONARY ARTERY DISEASE   . Right bundle branch block   . SUPRAVENTRICULAR TACHYCARDIA   . Raynaud's syndrome   . EXTERNAL HEMORRHOIDS   . GERD   . GASTRIC ANTRAL VASCULAR ECTASIA   . ENDOMETRIOSIS   . ACNE ROSACEA   . SJOGREN'S SYNDROME   . OSTEOARTHRITIS   . Rosacea, acne   . COLONIC POLYPS, ADENOMATOUS, HX OF   . HEADACHE, CHRONIC, HX OF   . Headache   . Hypertension   . Insomnia   . Sicca syndrome   . Vitamin D deficiency   . Chronic kidney disease   . Neuropathy   . Diverticulitis   . Paresthesia 08/01/2014    Past Surgical History  Procedure Laterality Date  . Cholecystectomy    . Bilateral oophorectomy    . Hiatal hernia repair  2005    and paraesophageal  . Esophagogastroduodenoscopy  11/27/2009    APC tretment of lesions  . Colonoscopy w/ biopsies and polypectomy  12/17/2006    adenomatous polyps, external hemorrhoids  . Tear duct surgery      right  . Total abdominal hysterectomy    . Breast lumpectomy      left-neg  . Breast  lumpectomy with radioactive seed localization Right 01/24/2014    Procedure: BREAST LUMPECTOMY WITH RADIOACTIVE SEED LOCALIZATION;  Surgeon: Avel Peace, MD;  Location: [facility];  Service: General;  Laterality: Right;    Family History  Problem Relation Age of Onset  . Heart failure Mother   . Diabetes Mother   . Kidney failure Mother   . Hypertension Mother   . Coronary artery disease Mother   . Breast cancer Sister     x 3 sisters  . Heart disease Father   . Hypertension Father   . Coronary artery disease Father   . Breast cancer Daughter   . Colon cancer Neg Hx   . Esophageal cancer Neg Hx   . Stomach cancer Neg Hx   . Rectal cancer Neg Hx   . Coronary artery disease Brother   . Coronary artery disease Brother   . Coronary artery disease Brother   . Breast cancer Sister   . Breast cancer Sister     Social history:  reports that she has never smoked. She has never used smokeless tobacco. She reports that she does not drink alcohol or use illicit drugs.  Medications:  Prior to Admission medications   Medication Sig Start Date End Date Taking? Authorizing Provider  amLODipine (NORVASC) 5 MG tablet Take 5  mg by mouth daily.    Yes Historical Provider, MD  Biotin 5000 MCG CAPS Take 1 capsule by mouth daily.   Yes Historical Provider, MD  calcium citrate-vitamin D (CITRACAL+D) 315-200 MG-UNIT per tablet Take 1 tablet by mouth daily.   Yes Historical Provider, MD  Capsicum, Cayenne, (CAYENNE PO) Take 1 capsule by mouth daily as needed (for stomach).    Yes Historical Provider, MD  Cholecalciferol (VITAMIN D3) 2000 UNITS TABS Take 1 capsule by mouth daily.   Yes Historical Provider, MD  Coenzyme Q10 (COQ-10 PO) Take 1 tablet by mouth daily as needed (for vitamin).    Yes Historical Provider, MD  cyclobenzaprine (FLEXERIL) 10 MG tablet Take 10 mg by mouth daily as needed for muscle spasms.   Yes Historical Provider, MD  DULoxetine (CYMBALTA) 20 MG capsule Take  20 mg by mouth daily.   Yes Historical Provider, MD  Evening Primrose Oil 500 MG CAPS Take 500 mg by mouth daily.   Yes Historical Provider, MD  ferrous sulfate 325 (65 FE) MG tablet Take 325 mg by mouth daily with breakfast.   Yes Historical Provider, MD  Flaxseed, Linseed, (FLAX SEEDS PO) Take 1 tablet by mouth daily.    Yes Historical Provider, MD  fluticasone (FLONASE) 50 MCG/ACT nasal spray Place 2 sprays into both nostrils daily.   Yes Historical Provider, MD  Ginger, Zingiber officinalis, (GINGER ROOT) 250 MG CAPS Take 250 mg by mouth daily.   Yes Historical Provider, MD  ibuprofen (ADVIL,MOTRIN) 200 MG tablet Take 200 mg by mouth every 6 (six) hours as needed.   Yes Historical Provider, MD  ketotifen (REFRESH EYE ITCH RELIEF) 0.025 % ophthalmic solution Place 2 drops into both eyes 2 (two) times daily as needed (for dry eyes).   Yes Historical Provider, MD  Multiple Vitamin (MULTIVITAMIN) capsule Take 1 capsule by mouth daily.    Yes Historical Provider, MD  nitroGLYCERIN (NITROSTAT) 0.4 MG SL tablet Place 1 tablet (0.4 mg total) under the tongue every 5 (five) minutes as needed for chest pain. 08/09/13  Yes Pricilla Loveless, MD  Nutritional Supplements (GRAPESEED EXTRACT PO) Take 1 tablet by mouth daily.    Yes Historical Provider, MD  temazepam (RESTORIL) 15 MG capsule Take 15 mg by mouth at bedtime as needed for sleep. SLEEP AID 08/03/13  Yes Historical Provider, MD  Turmeric 500 MG CAPS Take 1 capsule by mouth daily.   Yes Historical Provider, MD      Allergies  Allergen Reactions  . Ezetimibe Other (See Comments)    Muscle pain= zetia  . Ezetimibe-Simvastatin Other (See Comments)    Muscle pain= vytorin  . Morphine Other (See Comments)    nightmares  . Rosuvastatin Other (See Comments)    Muscle pain  . Ambien [Zolpidem Tartrate] Other (See Comments)    Fell down  . Plaquenil [Hydroxychloroquine Sulfate] Other (See Comments)  . Polytrim [Polymyxin B-Trimethoprim] Other (See  Comments)    unknown  . Prevacid [Lansoprazole] Other (See Comments)    dizziness    ROS:  Out of a complete 14 system review of symptoms, the patient complains only of the following symptoms, and all other reviewed systems are negative.  Fatigue Hearing loss Eye redness Cold intolerance Back pain, walking difficulty  Blood pressure 120/61, pulse 64, height  (1.6 m), weight 132 lb 6.4 oz (60.056 kg).  Physical Exam  General: The patient is alert and cooperative at the time of the examination.  Eyes: Pupils are equal, round,  and reactive to light. Discs are flat bilaterally.  Neck: The neck is supple, no carotid bruits are noted.  Respiratory: The respiratory examination is clear.  Cardiovascular: The cardiovascular examination reveals a regular rate and rhythm, no obvious murmurs or rubs are noted.  Skin: Extremities are without significant edema.  Neurologic Exam  Mental status: The patient is alert and oriented x 3 at the time of the examination. The patient has apparent normal recent and remote memory, with an apparently normal attention span and concentration ability.  Cranial nerves: Facial symmetry is present. There is good sensation of the face to pinprick and soft touch bilaterally. The strength of the facial muscles and the muscles to head turning and shoulder shrug are normal bilaterally. Speech is well enunciated, no aphasia or dysarthria is noted. Extraocular movements are full. Visual fields are full. The tongue is midline, and the patient has symmetric elevation of the soft palate. No obvious hearing deficits are noted.  Motor: The motor testing reveals 5 over 5 strength of all 4 extremities. Good symmetric motor tone is noted throughout.  Sensory: Sensory testing is intact to pinprick, soft touch, vibration sensation, and position sense on all 4 extremities. No evidence of extinction is noted.  Coordination: Cerebellar testing reveals good  finger-nose-finger and heel-to-shin bilaterally.  Gait and station: Gait is normal. Tandem gait is normal. Romberg is negative. No drift is seen.  Reflexes: Deep tendon reflexes are symmetric and normal bilaterally. Toes are downgoing bilaterally.   MRI brain 06/08/13:  IMPRESSION:  Abnormal MRI brain (without) demonstrating: 1. Moderate periventricular, subcortical, juxtacortical and cerebellar chronic small vessel ischemic disease. 2. No acute findings. 3. Mild progression of white matter disease compared to MRI on 03/20/09.  * MRI scan images were reviewed online. I agree with the written report.     Assessment/Plan:  1. Subjective paresthesias, left side  The patient has an unremarkable clinical examination. The patient reports daily episodes of left-sided numbness. We will initiate some workup to look for the etiology of the event. The patient is on low-dose aspirin. We will check MRI evaluation of the brain, and check a carotid Doppler study. I will contact the patient concerning the results.  Marlan Palau MD 08/01/2014 8:11 PM  [facility] [address] [place], [place] [address]  Phone [phone] Fax [phone]

## 2014-08-01 NOTE — Patient Instructions (Signed)

## 2014-08-04 ENCOUNTER — Encounter: Payer: Self-pay | Admitting: Surgery

## 2014-08-07 ENCOUNTER — Ambulatory Visit (HOSPITAL_COMMUNITY)
Admission: RE | Admit: 2014-08-07 | Discharge: 2014-08-07 | Disposition: A | Discharge status: Home / Self Care | Payer: Medicare Other | Source: Ambulatory Visit | Attending: Surgery | Admitting: Surgery

## 2014-08-07 ENCOUNTER — Encounter: Payer: Self-pay | Admitting: Surgery

## 2014-08-07 ENCOUNTER — Ambulatory Visit (INDEPENDENT_AMBULATORY_CARE_PROVIDER_SITE_OTHER): Payer: Medicare Other | Admitting: Surgery

## 2014-08-07 VITALS — BP 116/53 | HR 74 | Ht 63.0 in | Wt 129.0 lb

## 2014-08-07 DIAGNOSIS — G458 Other transient cerebral ischemic attacks and related syndromes: Secondary | ICD-10-CM

## 2014-08-07 DIAGNOSIS — I6523 Occlusion and stenosis of bilateral carotid arteries: Secondary | ICD-10-CM | POA: Diagnosis not present

## 2014-08-07 NOTE — Progress Notes (Signed)
Patient name: [name] MRN: [id number] DOB: 1935-07-15 Sex: female   Referred by: Dr. Cliffton Asters  Reason for referral:  Chief Complaint  Patient presents with  . New Evaluation    eval difference in BP in arms - r/o subclavian steal     HISTORY OF PRESENT ILLNESS: This is a 79 year old female who comes in today for possible subclavian steal.  She reports that she has difficulty checking her blood pressure in the left arm on occasion.  A PA also had a similar issue at the hospital.  She does report that she gets occasional left-sided numbness.  This starts in her leg and goes to her arm.  She is being evaluated by Dr. Anne Hahn for this.  She is scheduled for a MRI in the near future.  The patient states that she suffers from Raynaud's phenomenon.  She is also possibly has Sjogren's disease.  She suffers from dyslipidemia.  She is on multiple herbal medications.  She is a nonsmoker.  She has stage II chronic renal insufficiency  Past Medical History  Diagnosis Date  . DYSLIPIDEMIA   . IRON DEFICIENCY ANEMIA SECONDARY TO BLOOD LOSS   . CORONARY ARTERY DISEASE   . Right bundle branch block   . SUPRAVENTRICULAR TACHYCARDIA   . Raynaud's syndrome   . EXTERNAL HEMORRHOIDS   . GERD   . GASTRIC ANTRAL VASCULAR ECTASIA   . ENDOMETRIOSIS   . ACNE ROSACEA   . SJOGREN'S SYNDROME   . OSTEOARTHRITIS   . Rosacea, acne   . COLONIC POLYPS, ADENOMATOUS, HX OF   . HEADACHE, CHRONIC, HX OF   . Headache   . Hypertension   . Insomnia   . Sicca syndrome   . Vitamin D deficiency   . Chronic kidney disease   . Neuropathy   . Diverticulitis   . Paresthesia 08/01/2014  . Raynaud's disease     Past Surgical History  Procedure Laterality Date  . Cholecystectomy    . Bilateral oophorectomy    . Hiatal hernia repair  2005    and paraesophageal  . Esophagogastroduodenoscopy  11/27/2009    APC tretment of lesions  . Colonoscopy w/ biopsies and polypectomy  12/17/2006    adenomatous polyps,  external hemorrhoids  . Tear duct surgery      right  . Total abdominal hysterectomy    . Breast lumpectomy      left-neg  . Breast lumpectomy with radioactive seed localization Right 01/24/2014    Procedure: BREAST LUMPECTOMY WITH RADIOACTIVE SEED LOCALIZATION;  Surgeon: Avel Peace, MD;  Location: [facility];  Service: General;  Laterality: Right;    History   Social History  . Marital Status: Married    Spouse Name: N/A  . Number of Children: 3  . Years of Education: college   Occupational History  . retired    Social History Main Topics  . Smoking status: Never Smoker   . Smokeless tobacco: Never Used  . Alcohol Use: No     Comment: occasional// 1 drink a month  . Drug Use: No  . Sexual Activity: Not on file   Other Topics Concern  . Not on file   Social History Narrative   Patient drinks caffeine occasionally.   Patient is right handed.       Family History  Problem Relation Age of Onset  . Heart failure Mother   . Diabetes Mother   . Kidney failure Mother   . Hypertension Mother   .  Coronary artery disease Mother   . Heart disease Mother   . Hyperlipidemia Mother   . Peripheral vascular disease Mother     amputation  . Breast cancer Sister     x 3 sisters  . Cancer Sister   . Heart disease Father   . Hypertension Father   . Coronary artery disease Father   . Heart attack Father   . Breast cancer Daughter   . Cancer Daughter   . Colon cancer Neg Hx   . Esophageal cancer Neg Hx   . Stomach cancer Neg Hx   . Rectal cancer Neg Hx   . Coronary artery disease Brother   . Heart disease Brother   . Coronary artery disease Brother   . Coronary artery disease Brother   . Breast cancer Sister   . Breast cancer Sister     Allergies as of 08/07/2014 - Review Complete 08/07/2014  Allergen Reaction Noted  . Ezetimibe Other (See Comments) 06/24/2008  . Ezetimibe-simvastatin Other (See Comments) 06/24/2008  . Morphine Other (See  Comments) 06/24/2008  . Rosuvastatin Other (See Comments) 06/24/2008  . Ambien [zolpidem tartrate] Other (See Comments) 05/20/2013  . Plaquenil [hydroxychloroquine sulfate] Other (See Comments) 05/20/2013  . Polytrim [polymyxin b-trimethoprim] Other (See Comments) 05/20/2013  . Prevacid [lansoprazole] Other (See Comments) 05/20/2013    Current Outpatient Prescriptions on File Prior to Visit  Medication Sig Dispense Refill  . amLODipine (NORVASC) 5 MG tablet Take 5 mg by mouth daily.     [name] aspirin 81 MG tablet Take 81 mg by mouth daily.    . Biotin 5000 MCG CAPS Take 1 capsule by mouth daily.    . calcium citrate-vitamin D (CITRACAL+D) 315-200 MG-UNIT per tablet Take 1 tablet by mouth daily.    . Capsicum, Cayenne, (CAYENNE PO) Take 1 capsule by mouth daily as needed (for stomach).     . Cholecalciferol (VITAMIN D3) 2000 UNITS TABS Take 1 capsule by mouth daily.    . Coenzyme Q10 (COQ-10 PO) Take 1 tablet by mouth daily as needed (for vitamin).     . cyclobenzaprine (FLEXERIL) 10 MG tablet Take 10 mg by mouth daily as needed for muscle spasms.    . DULoxetine (CYMBALTA) 20 MG capsule Take 20 mg by mouth daily.    . Evening Primrose Oil 500 MG CAPS Take 500 mg by mouth daily.    . ferrous sulfate 325 (65 FE) MG tablet Take 325 mg by mouth daily with breakfast.    . Flaxseed, Linseed, (FLAX SEEDS PO) Take 1 tablet by mouth daily.     . fluticasone (FLONASE) 50 MCG/ACT nasal spray Place 2 sprays into both nostrils daily.    . Ginger, Zingiber officinalis, (GINGER ROOT) 250 MG CAPS Take 250 mg by mouth daily.    [name] ibuprofen (ADVIL,MOTRIN) 200 MG tablet Take 200 mg by mouth every 6 (six) hours as needed.    [name] ketotifen (REFRESH EYE ITCH RELIEF) 0.025 % ophthalmic solution Place 2 drops into both eyes 2 (two) times daily as needed (for dry eyes).    . Multiple Vitamin (MULTIVITAMIN) capsule Take 1 capsule by mouth daily.     . nitroGLYCERIN (NITROSTAT) 0.4 MG SL tablet Place 1 tablet (0.4 mg  total) under the tongue every 5 (five) minutes as needed for chest pain. 30 tablet 1  . Nutritional Supplements (GRAPESEED EXTRACT PO) Take 1 tablet by mouth daily.     . temazepam (RESTORIL) 15 MG capsule Take 15 mg by mouth at  bedtime as needed for sleep. SLEEP AID    . Turmeric 500 MG CAPS Take 1 capsule by mouth daily.     No current facility-administered medications on file prior to visit.     REVIEW OF SYSTEMS: Cardiovascular: No chest pain, chest pressure, orthopnea,  No claudication or rest pain,  No history of DVT or phlebitis.  Positive for palpitations and shortness of breath with exertion Pulmonary: No productive cough, asthma or wheezing. Neurologic: No weakness, paresthesias, aphasia, or amaurosis. No dizziness. Hematologic: No bleeding problems or clotting disorders. Musculoskeletal: No joint pain or joint swelling. Gastrointestinal: No blood in stool or hematemesis Genitourinary: No dysuria or hematuria. Psychiatric:: No history of major depression. Integumentary: Poison ivy to bilateral upper extremity Constitutional: No fever or chills.  PHYSICAL EXAMINATION:  Filed Vitals:   08/07/14 1332 08/07/14 1339  BP: 110/60 116/53  Pulse: 75 74  Height:  (1.6 m)   Weight: 129 lb (58.514 kg)   SpO2: 100%    Body mass index is 22.86 kg/(m^2). General: The patient appears their stated age.   HEENT:  No gross abnormalities Pulmonary: Respirations are non-labored Musculoskeletal: There are no major deformities.   Neurologic: No focal weakness or paresthesias are detected, Skin: There are no ulcer or rashes noted. Psychiatric: The patient has normal affect. Cardiovascular: There is a regular rate and rhythm without significant murmur appreciated.  Palpable bilateral radial and brachial pulses.  No carotid bruits  Diagnostic Studies: Carotid duplex ultrasound was performed today.  Vertebral arteries are antegrade.  Velocities suggest 1-49 percent bilateral internal  carotid artery stenosis.  No significant stenosis within bilateral external common carotid arteries.    Assessment:  Possible subclavian steal Plan: Based on the patient's Doppler studies which show antegrade vertebral artery blood flow, coupled with palpable brachial and radial pulses bilaterally, I do not think that the patient has subclavian steal syndrome.  In addition in our office today, she had symmetric blood pressures in her right and left arm.  These results were discussed with the patient.  She does not need any further workup from a vascular perspective.  She'll follow up on an as-needed basis.     Jorge Ny, M.D. [facility] Office: [phone] Pager:  [phone]

## 2014-08-10 ENCOUNTER — Encounter: Payer: Self-pay | Admitting: Family Medicine

## 2014-08-11 ENCOUNTER — Ambulatory Visit: Payer: Self-pay | Admitting: Cardiovascular Disease

## 2014-08-16 DIAGNOSIS — I219 Acute myocardial infarction, unspecified: Secondary | ICD-10-CM

## 2014-08-16 HISTORY — DX: Acute myocardial infarction, unspecified: I21.9

## 2014-08-17 ENCOUNTER — Encounter (HOSPITAL_BASED_OUTPATIENT_CLINIC_OR_DEPARTMENT_OTHER): Payer: Self-pay | Admitting: *Deleted

## 2014-08-17 ENCOUNTER — Inpatient Hospital Stay (HOSPITAL_BASED_OUTPATIENT_CLINIC_OR_DEPARTMENT_OTHER)
Admission: EM | Admit: 2014-08-17 | Discharge: 2014-08-22 | DRG: 280 | Disposition: A | Discharge status: Home / Self Care | Payer: Medicare Other | Attending: Cardiology | Admitting: Cardiology

## 2014-08-17 DIAGNOSIS — D500 Iron deficiency anemia secondary to blood loss (chronic): Secondary | ICD-10-CM | POA: Diagnosis present

## 2014-08-17 DIAGNOSIS — I214 Non-ST elevation (NSTEMI) myocardial infarction: Secondary | ICD-10-CM | POA: Diagnosis not present

## 2014-08-17 DIAGNOSIS — M79602 Pain in left arm: Secondary | ICD-10-CM | POA: Diagnosis not present

## 2014-08-17 DIAGNOSIS — I25118 Atherosclerotic heart disease of native coronary artery with other forms of angina pectoris: Secondary | ICD-10-CM | POA: Diagnosis present

## 2014-08-17 DIAGNOSIS — K219 Gastro-esophageal reflux disease without esophagitis: Secondary | ICD-10-CM | POA: Diagnosis present

## 2014-08-17 DIAGNOSIS — E785 Hyperlipidemia, unspecified: Secondary | ICD-10-CM | POA: Diagnosis present

## 2014-08-17 DIAGNOSIS — I2542 Coronary artery dissection: Secondary | ICD-10-CM | POA: Diagnosis not present

## 2014-08-17 DIAGNOSIS — I7300 Raynaud's syndrome without gangrene: Secondary | ICD-10-CM | POA: Diagnosis present

## 2014-08-17 DIAGNOSIS — N809 Endometriosis, unspecified: Secondary | ICD-10-CM | POA: Diagnosis present

## 2014-08-17 DIAGNOSIS — I2584 Coronary atherosclerosis due to calcified coronary lesion: Secondary | ICD-10-CM | POA: Diagnosis present

## 2014-08-17 DIAGNOSIS — K644 Residual hemorrhoidal skin tags: Secondary | ICD-10-CM | POA: Diagnosis present

## 2014-08-17 DIAGNOSIS — M3500 Sicca syndrome, unspecified: Secondary | ICD-10-CM | POA: Diagnosis present

## 2014-08-17 DIAGNOSIS — Z79899 Other long term (current) drug therapy: Secondary | ICD-10-CM

## 2014-08-17 DIAGNOSIS — K31819 Angiodysplasia of stomach and duodenum without bleeding: Secondary | ICD-10-CM | POA: Diagnosis present

## 2014-08-17 DIAGNOSIS — I2510 Atherosclerotic heart disease of native coronary artery without angina pectoris: Secondary | ICD-10-CM

## 2014-08-17 DIAGNOSIS — N189 Chronic kidney disease, unspecified: Secondary | ICD-10-CM | POA: Diagnosis present

## 2014-08-17 DIAGNOSIS — Z7902 Long term (current) use of antithrombotics/antiplatelets: Secondary | ICD-10-CM

## 2014-08-17 DIAGNOSIS — Z833 Family history of diabetes mellitus: Secondary | ICD-10-CM

## 2014-08-17 DIAGNOSIS — G4700 Insomnia, unspecified: Secondary | ICD-10-CM | POA: Diagnosis present

## 2014-08-17 DIAGNOSIS — K5792 Diverticulitis of intestine, part unspecified, without perforation or abscess without bleeding: Secondary | ICD-10-CM | POA: Diagnosis present

## 2014-08-17 DIAGNOSIS — Z9861 Coronary angioplasty status: Secondary | ICD-10-CM

## 2014-08-17 DIAGNOSIS — Z7982 Long term (current) use of aspirin: Secondary | ICD-10-CM

## 2014-08-17 DIAGNOSIS — Z8249 Family history of ischemic heart disease and other diseases of the circulatory system: Secondary | ICD-10-CM

## 2014-08-17 DIAGNOSIS — L719 Rosacea, unspecified: Secondary | ICD-10-CM | POA: Diagnosis present

## 2014-08-17 DIAGNOSIS — I471 Supraventricular tachycardia: Secondary | ICD-10-CM | POA: Diagnosis present

## 2014-08-17 DIAGNOSIS — E559 Vitamin D deficiency, unspecified: Secondary | ICD-10-CM | POA: Diagnosis present

## 2014-08-17 DIAGNOSIS — I4510 Unspecified right bundle-branch block: Secondary | ICD-10-CM | POA: Diagnosis present

## 2014-08-17 DIAGNOSIS — G629 Polyneuropathy, unspecified: Secondary | ICD-10-CM | POA: Diagnosis present

## 2014-08-17 HISTORY — DX: Fibromyalgia: M79.7

## 2014-08-17 HISTORY — DX: Other chronic pain: G89.29

## 2014-08-17 HISTORY — DX: Dorsalgia, unspecified: M54.9

## 2014-08-17 NOTE — ED Notes (Signed)
Pt reports left arm numbness that started 1 month ago.  States that for the first time tonight, she also had pain in her arm.  Denies pain at this time.  Reports heaviness.  No other deficits noted.

## 2014-08-18 ENCOUNTER — Emergency Department (HOSPITAL_BASED_OUTPATIENT_CLINIC_OR_DEPARTMENT_OTHER): Payer: Medicare Other

## 2014-08-18 ENCOUNTER — Inpatient Hospital Stay (HOSPITAL_COMMUNITY): Payer: Medicare Other

## 2014-08-18 ENCOUNTER — Encounter (HOSPITAL_COMMUNITY)
Admission: EM | Disposition: A | Discharge status: Home / Self Care | Payer: Medicare Other | Source: Home / Self Care | Attending: Cardiology

## 2014-08-18 DIAGNOSIS — I214 Non-ST elevation (NSTEMI) myocardial infarction: Secondary | ICD-10-CM

## 2014-08-18 DIAGNOSIS — R079 Chest pain, unspecified: Secondary | ICD-10-CM

## 2014-08-18 DIAGNOSIS — Z7982 Long term (current) use of aspirin: Secondary | ICD-10-CM | POA: Diagnosis not present

## 2014-08-18 DIAGNOSIS — M79602 Pain in left arm: Secondary | ICD-10-CM | POA: Diagnosis present

## 2014-08-18 DIAGNOSIS — Z79899 Other long term (current) drug therapy: Secondary | ICD-10-CM | POA: Diagnosis not present

## 2014-08-18 DIAGNOSIS — I471 Supraventricular tachycardia: Secondary | ICD-10-CM | POA: Diagnosis present

## 2014-08-18 DIAGNOSIS — I25118 Atherosclerotic heart disease of native coronary artery with other forms of angina pectoris: Secondary | ICD-10-CM | POA: Diagnosis present

## 2014-08-18 DIAGNOSIS — K31819 Angiodysplasia of stomach and duodenum without bleeding: Secondary | ICD-10-CM | POA: Diagnosis present

## 2014-08-18 DIAGNOSIS — I2542 Coronary artery dissection: Secondary | ICD-10-CM | POA: Diagnosis not present

## 2014-08-18 DIAGNOSIS — D500 Iron deficiency anemia secondary to blood loss (chronic): Secondary | ICD-10-CM | POA: Diagnosis present

## 2014-08-18 DIAGNOSIS — Z8249 Family history of ischemic heart disease and other diseases of the circulatory system: Secondary | ICD-10-CM | POA: Diagnosis not present

## 2014-08-18 DIAGNOSIS — Z889 Allergy status to unspecified drugs, medicaments and biological substances status: Secondary | ICD-10-CM | POA: Diagnosis not present

## 2014-08-18 DIAGNOSIS — I2510 Atherosclerotic heart disease of native coronary artery without angina pectoris: Secondary | ICD-10-CM | POA: Diagnosis not present

## 2014-08-18 DIAGNOSIS — I4510 Unspecified right bundle-branch block: Secondary | ICD-10-CM | POA: Diagnosis present

## 2014-08-18 DIAGNOSIS — K644 Residual hemorrhoidal skin tags: Secondary | ICD-10-CM | POA: Diagnosis present

## 2014-08-18 DIAGNOSIS — K219 Gastro-esophageal reflux disease without esophagitis: Secondary | ICD-10-CM | POA: Diagnosis present

## 2014-08-18 DIAGNOSIS — E559 Vitamin D deficiency, unspecified: Secondary | ICD-10-CM | POA: Diagnosis present

## 2014-08-18 DIAGNOSIS — E785 Hyperlipidemia, unspecified: Secondary | ICD-10-CM | POA: Diagnosis present

## 2014-08-18 DIAGNOSIS — G629 Polyneuropathy, unspecified: Secondary | ICD-10-CM | POA: Diagnosis not present

## 2014-08-18 DIAGNOSIS — Z7902 Long term (current) use of antithrombotics/antiplatelets: Secondary | ICD-10-CM | POA: Diagnosis not present

## 2014-08-18 DIAGNOSIS — I2584 Coronary atherosclerosis due to calcified coronary lesion: Secondary | ICD-10-CM | POA: Diagnosis present

## 2014-08-18 DIAGNOSIS — N809 Endometriosis, unspecified: Secondary | ICD-10-CM | POA: Diagnosis present

## 2014-08-18 DIAGNOSIS — Z833 Family history of diabetes mellitus: Secondary | ICD-10-CM | POA: Diagnosis not present

## 2014-08-18 DIAGNOSIS — L719 Rosacea, unspecified: Secondary | ICD-10-CM | POA: Diagnosis present

## 2014-08-18 DIAGNOSIS — I7300 Raynaud's syndrome without gangrene: Secondary | ICD-10-CM | POA: Diagnosis present

## 2014-08-18 DIAGNOSIS — M3500 Sicca syndrome, unspecified: Secondary | ICD-10-CM | POA: Diagnosis present

## 2014-08-18 DIAGNOSIS — N189 Chronic kidney disease, unspecified: Secondary | ICD-10-CM | POA: Diagnosis present

## 2014-08-18 DIAGNOSIS — K5792 Diverticulitis of intestine, part unspecified, without perforation or abscess without bleeding: Secondary | ICD-10-CM | POA: Diagnosis not present

## 2014-08-18 DIAGNOSIS — G4700 Insomnia, unspecified: Secondary | ICD-10-CM | POA: Diagnosis present

## 2014-08-18 HISTORY — PX: CARDIAC CATHETERIZATION: SHX172

## 2014-08-18 HISTORY — DX: Non-ST elevation (NSTEMI) myocardial infarction: I21.4

## 2014-08-18 LAB — CBC WITH DIFFERENTIAL/PLATELET
BASOS ABS: 0 10*3/uL (ref 0.0–0.1)
Basophils Relative: 0 % (ref 0–1)
EOS ABS: 0.2 10*3/uL (ref 0.0–0.7)
Eosinophils Relative: 3 % (ref 0–5)
HCT: 34.4 % — ABNORMAL LOW (ref 36.0–46.0)
HEMOGLOBIN: 11.3 g/dL — AB (ref 12.0–15.0)
LYMPHS PCT: 22 % (ref 12–46)
Lymphs Abs: 1.3 10*3/uL (ref 0.7–4.0)
MCH: 27.4 pg (ref 26.0–34.0)
MCHC: 32.8 g/dL (ref 30.0–36.0)
MCV: 83.5 fL (ref 78.0–100.0)
Monocytes Absolute: 0.8 10*3/uL (ref 0.1–1.0)
Monocytes Relative: 14 % — ABNORMAL HIGH (ref 3–12)
Neutro Abs: 3.7 10*3/uL (ref 1.7–7.7)
Neutrophils Relative %: 61 % (ref 43–77)
PLATELETS: 193 10*3/uL (ref 150–400)
RBC: 4.12 MIL/uL (ref 3.87–5.11)
RDW: 14.5 % (ref 11.5–15.5)
WBC: 6.1 10*3/uL (ref 4.0–10.5)

## 2014-08-18 LAB — TROPONIN I
TROPONIN I: 0.29 ng/mL — AB (ref <0.031)
TROPONIN I: 0.43 ng/mL — AB (ref <0.031)
Troponin I: 0.44 ng/mL — ABNORMAL HIGH (ref <0.031)
Troponin I: 0.31 ng/mL — ABNORMAL HIGH (ref <0.031)

## 2014-08-18 LAB — BASIC METABOLIC PANEL
Anion gap: 7 (ref 5–15)
BUN: 22 mg/dL — ABNORMAL HIGH (ref 6–20)
CALCIUM: 9.6 mg/dL (ref 8.9–10.3)
CO2: 26 mmol/L (ref 22–32)
Chloride: 101 mmol/L (ref 101–111)
Creatinine, Ser: 1.23 mg/dL — ABNORMAL HIGH (ref 0.44–1.00)
GFR calc non Af Amer: 42 mL/min — ABNORMAL LOW (ref >60)
GFR, EST AFRICAN AMERICAN: 48 mL/min — AB (ref >60)
GLUCOSE: 98 mg/dL (ref 65–99)
Potassium: 4.6 mmol/L (ref 3.5–5.1)
Sodium: 134 mmol/L — ABNORMAL LOW (ref 135–145)

## 2014-08-18 LAB — TSH: TSH: 1.629 u[IU]/mL (ref 0.350–4.500)

## 2014-08-18 LAB — POCT ACTIVATED CLOTTING TIME: Activated Clotting Time: 620 seconds

## 2014-08-18 LAB — HEPARIN LEVEL (UNFRACTIONATED): Heparin Unfractionated: 0.21 IU/mL — ABNORMAL LOW (ref 0.30–0.70)

## 2014-08-18 SURGERY — LEFT HEART CATH AND CORONARY ANGIOGRAPHY
Anesthesia: LOCAL

## 2014-08-18 MED ORDER — ASPIRIN 81 MG PO CHEW
324.0000 mg | CHEWABLE_TABLET | Freq: Once | ORAL | Status: DC
  Filled 2014-08-18: qty 4

## 2014-08-18 MED ORDER — TICAGRELOR 90 MG PO TABS
ORAL_TABLET | ORAL | Status: DC | PRN
  Administered 2014-08-18: 180 mg via ORAL

## 2014-08-18 MED ORDER — HEPARIN (PORCINE) IN NACL 100-0.45 UNIT/ML-% IJ SOLN
750.0000 [IU]/h | INTRAMUSCULAR | Status: DC
  Administered 2014-08-18: 700 [IU]/h via INTRAVENOUS
  Filled 2014-08-18: qty 250

## 2014-08-18 MED ORDER — METOPROLOL SUCCINATE ER 25 MG PO TB24
12.5000 mg | ORAL_TABLET | Freq: Every day | ORAL | Status: DC
  Administered 2014-08-18 – 2014-08-22 (×4): 12.5 mg via ORAL
  Filled 2014-08-18 (×5): qty 1

## 2014-08-18 MED ORDER — NITROGLYCERIN 1 MG/10 ML FOR IR/CATH LAB
INTRA_ARTERIAL | Status: AC
  Filled 2014-08-18: qty 10

## 2014-08-18 MED ORDER — BIVALIRUDIN BOLUS VIA INFUSION - CUPID
INTRAVENOUS | Status: DC | PRN
  Administered 2014-08-18: 43.95 mg via INTRAVENOUS

## 2014-08-18 MED ORDER — BIVALIRUDIN 250 MG IV SOLR
INTRAVENOUS | Status: AC
  Filled 2014-08-18: qty 250

## 2014-08-18 MED ORDER — TIROFIBAN (AGGRASTAT) BOLUS VIA INFUSION
INTRAVENOUS | Status: DC | PRN
  Administered 2014-08-18: 1465 ug via INTRAVENOUS

## 2014-08-18 MED ORDER — FAMOTIDINE IN NACL 20-0.9 MG/50ML-% IV SOLN
INTRAVENOUS | Status: AC
  Filled 2014-08-18: qty 50

## 2014-08-18 MED ORDER — TICAGRELOR 90 MG PO TABS
ORAL_TABLET | ORAL | Status: AC
  Filled 2014-08-18: qty 2

## 2014-08-18 MED ORDER — VERAPAMIL HCL 2.5 MG/ML IV SOLN
INTRAVENOUS | Status: DC | PRN
  Administered 2014-08-18: 19:00:00 via INTRA_ARTERIAL

## 2014-08-18 MED ORDER — LIDOCAINE HCL (PF) 1 % IJ SOLN
INTRAMUSCULAR | Status: DC | PRN
  Administered 2014-08-18: 2 mL via SUBCUTANEOUS

## 2014-08-18 MED ORDER — VERAPAMIL HCL 2.5 MG/ML IV SOLN
INTRAVENOUS | Status: AC
  Filled 2014-08-18: qty 2

## 2014-08-18 MED ORDER — HEPARIN (PORCINE) IN NACL 100-0.45 UNIT/ML-% IJ SOLN
12.0000 [IU]/kg/h | INTRAMUSCULAR | Status: DC
  Administered 2014-08-18: 12 [IU]/kg/h via INTRAVENOUS
  Filled 2014-08-18: qty 250

## 2014-08-18 MED ORDER — IOHEXOL 350 MG/ML SOLN
INTRAVENOUS | Status: DC | PRN
  Administered 2014-08-18: 140 mL via INTRACARDIAC

## 2014-08-18 MED ORDER — SODIUM CHLORIDE 0.9 % IV SOLN: 250.0000 mL | INTRAVENOUS | Status: DC | PRN

## 2014-08-18 MED ORDER — MIDAZOLAM HCL 2 MG/2ML IJ SOLN
INTRAMUSCULAR | Status: AC
  Filled 2014-08-18: qty 2

## 2014-08-18 MED ORDER — HEPARIN (PORCINE) IN NACL 2-0.9 UNIT/ML-% IJ SOLN
INTRAMUSCULAR | Status: AC
  Filled 2014-08-18: qty 1000

## 2014-08-18 MED ORDER — SODIUM CHLORIDE 0.9 % IJ SOLN: 3.0000 mL | INTRAMUSCULAR | Status: DC | PRN

## 2014-08-18 MED ORDER — DULOXETINE HCL 20 MG PO CPEP
20.0000 mg | ORAL_CAPSULE | Freq: Every day | ORAL | Status: DC
  Filled 2014-08-18 (×6): qty 1

## 2014-08-18 MED ORDER — TIROFIBAN HCL IV 12.5 MG/250 ML
0.0750 ug/kg/min | INTRAVENOUS | Status: AC
  Administered 2014-08-18 – 2014-08-19 (×2): 0.075 ug/kg/min via INTRAVENOUS
  Filled 2014-08-18 (×2): qty 250

## 2014-08-18 MED ORDER — SODIUM CHLORIDE 0.9 % IV SOLN
INTRAVENOUS | Status: AC
  Administered 2014-08-18: 22:00:00 via INTRAVENOUS

## 2014-08-18 MED ORDER — SODIUM CHLORIDE 0.9 % IJ SOLN
3.0000 mL | Freq: Two times a day (BID) | INTRAMUSCULAR | Status: DC
  Administered 2014-08-19: 3 mL via INTRAVENOUS

## 2014-08-18 MED ORDER — AMLODIPINE BESYLATE 5 MG PO TABS
5.0000 mg | ORAL_TABLET | Freq: Every day | ORAL | Status: DC
  Administered 2014-08-18 – 2014-08-20 (×3): 5 mg via ORAL
  Filled 2014-08-18 (×5): qty 1

## 2014-08-18 MED ORDER — SODIUM CHLORIDE 0.9 % IV SOLN
INTRAVENOUS | Status: AC
  Administered 2014-08-18: 08:00:00 via INTRAVENOUS

## 2014-08-18 MED ORDER — LIDOCAINE HCL (PF) 1 % IJ SOLN
INTRAMUSCULAR | Status: AC
  Filled 2014-08-18: qty 30

## 2014-08-18 MED ORDER — NITROGLYCERIN 1 MG/10 ML FOR IR/CATH LAB
INTRA_ARTERIAL | Status: DC | PRN
  Administered 2014-08-18: 200 ug via INTRACORONARY

## 2014-08-18 MED ORDER — TIROFIBAN HCL IV 5 MG/100ML
INTRAVENOUS | Status: AC
  Filled 2014-08-18: qty 100

## 2014-08-18 MED ORDER — BIVALIRUDIN 250 MG IV SOLR
250.0000 mg | INTRAVENOUS | Status: DC | PRN
  Administered 2014-08-18: 1.75 mg/kg/h via INTRAVENOUS

## 2014-08-18 MED ORDER — ASPIRIN 81 MG PO CHEW: 324.0000 mg | CHEWABLE_TABLET | Freq: Once | ORAL | Status: DC

## 2014-08-18 MED ORDER — MIDAZOLAM HCL 2 MG/2ML IJ SOLN
INTRAMUSCULAR | Status: DC | PRN
  Administered 2014-08-18: 1 mg via INTRAVENOUS

## 2014-08-18 MED ORDER — HEPARIN SODIUM (PORCINE) 1000 UNIT/ML IJ SOLN
INTRAMUSCULAR | Status: AC
  Filled 2014-08-18: qty 1

## 2014-08-18 MED ORDER — HEPARIN (PORCINE) IN NACL 100-0.45 UNIT/ML-% IJ SOLN
900.0000 [IU]/h | INTRAMUSCULAR | Status: DC
  Administered 2014-08-19: 750 [IU]/h via INTRAVENOUS
  Administered 2014-08-20 – 2014-08-21 (×2): 900 [IU]/h via INTRAVENOUS
  Filled 2014-08-18 (×6): qty 250

## 2014-08-18 MED ORDER — HEPARIN SODIUM (PORCINE) 1000 UNIT/ML IJ SOLN
INTRAMUSCULAR | Status: DC | PRN
  Administered 2014-08-18: 3000 [IU] via INTRAVENOUS

## 2014-08-18 MED ORDER — NITROGLYCERIN IN D5W 200-5 MCG/ML-% IV SOLN
5.0000 ug/min | INTRAVENOUS | Status: DC
  Administered 2014-08-18: 5 ug/min via INTRAVENOUS
  Filled 2014-08-18: qty 250

## 2014-08-18 MED ORDER — FAMOTIDINE IN NACL 20-0.9 MG/50ML-% IV SOLN
INTRAVENOUS | Status: DC | PRN
  Administered 2014-08-18: 20 mg via INTRAVENOUS

## 2014-08-18 MED ORDER — ACETAMINOPHEN 325 MG PO TABS
650.0000 mg | ORAL_TABLET | ORAL | Status: DC | PRN
  Administered 2014-08-22: 650 mg via ORAL
  Filled 2014-08-18: qty 2

## 2014-08-18 MED ORDER — TEMAZEPAM 15 MG PO CAPS
15.0000 mg | ORAL_CAPSULE | Freq: Every evening | ORAL | Status: DC | PRN
  Administered 2014-08-22: 15 mg via ORAL
  Filled 2014-08-18: qty 1

## 2014-08-18 MED ORDER — FENTANYL CITRATE (PF) 100 MCG/2ML IJ SOLN
INTRAMUSCULAR | Status: AC
  Filled 2014-08-18: qty 2

## 2014-08-18 MED ORDER — FENTANYL CITRATE (PF) 100 MCG/2ML IJ SOLN
INTRAMUSCULAR | Status: DC | PRN
  Administered 2014-08-18: 25 ug via INTRAVENOUS

## 2014-08-18 MED ORDER — TIROFIBAN HCL IV 5 MG/100ML
INTRAVENOUS | Status: DC | PRN
  Administered 2014-08-18: 0.075 ug/kg/min via INTRAVENOUS

## 2014-08-18 MED ORDER — NITROGLYCERIN 0.4 MG SL SUBL: 0.4000 mg | SUBLINGUAL_TABLET | SUBLINGUAL | Status: DC | PRN

## 2014-08-18 MED ORDER — ONDANSETRON HCL 4 MG/2ML IJ SOLN
4.0000 mg | Freq: Four times a day (QID) | INTRAMUSCULAR | Status: DC | PRN
  Administered 2014-08-19: 4 mg via INTRAVENOUS
  Filled 2014-08-18: qty 2

## 2014-08-18 MED ORDER — ASPIRIN 81 MG PO CHEW
81.0000 mg | CHEWABLE_TABLET | Freq: Every day | ORAL | Status: DC
  Administered 2014-08-18 – 2014-08-22 (×5): 81 mg via ORAL
  Filled 2014-08-18 (×5): qty 1

## 2014-08-18 MED ORDER — TICAGRELOR 90 MG PO TABS
ORAL_TABLET | ORAL | Status: AC
  Filled 2014-08-18: qty 1

## 2014-08-18 SURGICAL SUPPLY — 20 items
BALLN EMERGE MR 2.5X12 (BALLOONS) ×3
BALLOON EMERGE MR 2.5X12 (BALLOONS) IMPLANT
CATH INFINITI 5FR ANG PIGTAIL (CATHETERS) ×3 IMPLANT
CATH INFINITI 5FR MULTPACK ANG (CATHETERS) IMPLANT
CATH OPTITORQUE TIG 4.0 5F (CATHETERS) ×3 IMPLANT
CATH VISTA GUIDE 6FR JR4 (CATHETERS) ×1 IMPLANT
DEVICE RAD COMP TR BAND LRG (VASCULAR PRODUCTS) ×3 IMPLANT
GLIDESHEATH SLEND A-KIT 6F 22G (SHEATH) ×3 IMPLANT
KIT ENCORE 26 ADVANTAGE (KITS) ×1 IMPLANT
KIT HEART LEFT (KITS) ×3 IMPLANT
PACK CARDIAC CATHETERIZATION (CUSTOM PROCEDURE TRAY) ×3 IMPLANT
SHEATH PINNACLE 5F 10CM (SHEATH) IMPLANT
SYR MEDRAD MARK V 150ML (SYRINGE) ×3 IMPLANT
TRANSDUCER W/STOPCOCK (MISCELLANEOUS) ×3 IMPLANT
TUBING CIL FLEX 10 FLL-RA (TUBING) ×3 IMPLANT
WIRE EMERALD 3MM-J .035X150CM (WIRE) IMPLANT
WIRE LUGE 182CM (WIRE) ×1 IMPLANT
WIRE PT2 MS 185 (WIRE) ×1 IMPLANT
WIRE RUNTHROUGH .014X180CM (WIRE) ×1 IMPLANT
WIRE SAFE-T 1.5MM-J .035X260CM (WIRE) ×3 IMPLANT

## 2014-08-18 NOTE — Progress Notes (Signed)
    Subjective:  No further left arm pain.  Objective:  Vital Signs in the last 24 hours: Temp:  [98.3 F (36.8 C)-98.7 F (37.1 C)] 98.7 F (37.1 C) (06/03 0335) Pulse Rate:  [68-82] 82 (06/03 0335) Resp:  [11-18] 11 (06/03 0230) BP: (129-156)/(59-61) 156/59 mmHg (06/03 0335) SpO2:  [97 %-100 %] 100 % (06/03 0718) FiO2 (%):  [21 %] 21 % (06/03 0718) Weight:  [129 lb 3 oz (58.6 kg)-130 lb (58.968 kg)] 129 lb 3 oz (58.6 kg) (06/03 0335)  Intake/Output from previous day: 06/02 0701 - 06/03 0700 In: 325.1 [P.O.:300; I.V.:25.1] Out: 1125 [Urine:1125]  Physical Exam: Pt is alert and oriented, NAD HEENT: normal Neck: JVP - normal Lungs: CTA bilaterally CV: RRR grade 2/6 diastolic decrescendo murmur heard at the left lower sternal border Abd: soft, NT, Positive BS, no hepatomegaly Ext: no C/C/E, distal pulses intact and equal Skin: warm/dry no rash   Lab Results:  Recent Labs  08/18/14 0011  WBC 6.1  HGB 11.3*  PLT 193    Recent Labs  08/18/14 0011  NA 134*  K 4.6  CL 101  CO2 26  GLUCOSE 98  BUN 22*  CREATININE 1.23*    Recent Labs  08/18/14 0011  TROPONINI 0.43*   Assessment/Plan:  78-year-old woman with moderate coronary artery disease by cardiac catheterization 10 years ago, now presenting with left arm discomfort and elevated troponin. Her symptoms are suggestive of ACS/non-ST elevation infarction. She is on IV heparin. I think cardiac catheterization and possible PCI or indicated. I have reviewed the risks, indications, and alternatives to cardiac catheterization, angioplasty, and stenting with the patient. Risks include but are not limited to bleeding, infection, vascular injury, stroke, myocardial infection, arrhythmia, kidney injury, radiation-related injury in the case of prolonged fluoroscopy use, emergency cardiac surgery, and death. The patient understands the risks of serious complication is low (<1%).    Larua Collier, M.D. 08/18/2014, 9:49  AM     

## 2014-08-18 NOTE — ED Provider Notes (Signed)
This chart was scribed for [name] Maw Essense Bousquet, DO by Elon Spanner, ED Scribe. This patient was seen in room MH10/MH10 and the patient's care was started at 12:08 AM.  CHIEF COMPLAINT:  Chief Complaint  Patient presents with  . Arm Injury     HPI: HPI Comments: [name] is a 79 y.o. female with history of Sjogren's, hypertension, dyslipidemia who presents to the Emergency Department complaining of resolved severe left arm pain onset 8:00 pm tonight as the patient was trying to sleep.  The entire episode last 20 minutes.  She reports no precipitating event and no aggravating or alleviating facts.  Prior to today's complain, the patient has had 1 month of numbness in the entire left arm and leg but no facial numbness.  States this is not why she came to the emergency department. She has follow-up for this tomorrow. Patient has a  history of cardiac catheterization 15-20 years ago that she reports showed nonobstructive coronary artery disease. No history of stents.  She denies chest pain or chest discomfort SOB, n/v, diaphoresis, dizziness. No history of PE or DVT. No lower swelling or pain. No history of injury to this arm.  PCP: Dr. Cliffton Asters Cardiologist: Dr. Excell Seltzer  ROS: See HPI Constitutional: no fever or diaphoresis Eyes: no drainage  ENT: no runny nose   Cardiovascular:  no chest pain  Resp: no SOB  GI: no vomiting or nausea GU: no dysuria Integumentary: no rash  Allergy: no hives  Musculoskeletal: no leg swelling  Neurological: no slurred speech or dizziness ROS otherwise negative  PAST MEDICAL HISTORY/PAST SURGICAL HISTORY:  Past Medical History  Diagnosis Date  . DYSLIPIDEMIA   . IRON DEFICIENCY ANEMIA SECONDARY TO BLOOD LOSS   . CORONARY ARTERY DISEASE   . Right bundle branch block   . SUPRAVENTRICULAR TACHYCARDIA   . Raynaud's syndrome   . EXTERNAL HEMORRHOIDS   . GERD   . GASTRIC ANTRAL VASCULAR ECTASIA   . ENDOMETRIOSIS   . ACNE ROSACEA   . SJOGREN'S SYNDROME   .  OSTEOARTHRITIS   . Rosacea, acne   . COLONIC POLYPS, ADENOMATOUS, HX OF   . HEADACHE, CHRONIC, HX OF   . Headache   . Hypertension   . Insomnia   . Sicca syndrome   . Vitamin D deficiency   . Chronic kidney disease   . Neuropathy   . Diverticulitis   . Paresthesia 08/01/2014  . Raynaud's disease     MEDICATIONS:  Prior to Admission medications   Medication Sig Start Date End Date Taking? Authorizing Provider  amLODipine (NORVASC) 5 MG tablet Take 5 mg by mouth daily.     Historical Provider, MD  aspirin 81 MG tablet Take 81 mg by mouth daily.    Historical Provider, MD  Biotin 5000 MCG CAPS Take 1 capsule by mouth daily.    Historical Provider, MD  calcium citrate-vitamin D (CITRACAL+D) 315-200 MG-UNIT per tablet Take 1 tablet by mouth daily.    Historical Provider, MD  Capsicum, Cayenne, (CAYENNE PO) Take 1 capsule by mouth daily as needed (for stomach).     Historical Provider, MD  Cholecalciferol (VITAMIN D3) 2000 UNITS TABS Take 1 capsule by mouth daily.    Historical Provider, MD  Coenzyme Q10 (COQ-10 PO) Take 1 tablet by mouth daily as needed (for vitamin).     Historical Provider, MD  cyclobenzaprine (FLEXERIL) 10 MG tablet Take 10 mg by mouth daily as needed for muscle spasms.    Historical Provider, MD  DULoxetine (CYMBALTA) 20 MG capsule Take 20 mg by mouth daily.    Historical Provider, MD  Evening Primrose Oil 500 MG CAPS Take 500 mg by mouth daily.    Historical Provider, MD  ferrous sulfate 325 (65 FE) MG tablet Take 325 mg by mouth daily with breakfast.    Historical Provider, MD  Flaxseed, Linseed, (FLAX SEEDS PO) Take 1 tablet by mouth daily.     Historical Provider, MD  fluticasone (FLONASE) 50 MCG/ACT nasal spray Place 2 sprays into both nostrils daily.    Historical Provider, MD  Ginger, Zingiber officinalis, (GINGER ROOT) 250 MG CAPS Take 250 mg by mouth daily.    Historical Provider, MD  ibuprofen (ADVIL,MOTRIN) 200 MG tablet Take 200 mg by mouth every 6 (six)  hours as needed.    Historical Provider, MD  ketotifen (REFRESH EYE ITCH RELIEF) 0.025 % ophthalmic solution Place 2 drops into both eyes 2 (two) times daily as needed (for dry eyes).    Historical Provider, MD  Multiple Vitamin (MULTIVITAMIN) capsule Take 1 capsule by mouth daily.     Historical Provider, MD  nitroGLYCERIN (NITROSTAT) 0.4 MG SL tablet Place 1 tablet (0.4 mg total) under the tongue every 5 (five) minutes as needed for chest pain. 08/09/13   Pricilla Loveless, MD  Nutritional Supplements (GRAPESEED EXTRACT PO) Take 1 tablet by mouth daily.     Historical Provider, MD  temazepam (RESTORIL) 15 MG capsule Take 15 mg by mouth at bedtime as needed for sleep. SLEEP AID 08/03/13   Historical Provider, MD  Turmeric 500 MG CAPS Take 1 capsule by mouth daily.    Historical Provider, MD    ALLERGIES:  Allergies  Allergen Reactions  . Ezetimibe Other (See Comments)    Muscle pain= zetia  . Ezetimibe-Simvastatin Other (See Comments)    Muscle pain= vytorin  . Morphine Other (See Comments)    nightmares  . Rosuvastatin Other (See Comments)    Muscle pain  . Ambien [Zolpidem Tartrate] Other (See Comments)    Fell down  . Plaquenil [Hydroxychloroquine Sulfate] Other (See Comments)  . Polytrim [Polymyxin B-Trimethoprim] Other (See Comments)    unknown  . Prevacid [Lansoprazole] Other (See Comments)    dizziness    SOCIAL HISTORY:  History  Substance Use Topics  . Smoking status: Never Smoker   . Smokeless tobacco: Never Used  . Alcohol Use: No     Comment: occasional// 1 drink a month    FAMILY HISTORY: Family History  Problem Relation Age of Onset  . Heart failure Mother   . Diabetes Mother   . Kidney failure Mother   . Hypertension Mother   . Coronary artery disease Mother   . Heart disease Mother   . Hyperlipidemia Mother   . Peripheral vascular disease Mother     amputation  . Breast cancer Sister     x 3 sisters  . Cancer Sister   . Heart disease Father   .  Hypertension Father   . Coronary artery disease Father   . Heart attack Father   . Breast cancer Daughter   . Cancer Daughter   . Colon cancer Neg Hx   . Esophageal cancer Neg Hx   . Stomach cancer Neg Hx   . Rectal cancer Neg Hx   . Coronary artery disease Brother   . Heart disease Brother   . Coronary artery disease Brother   . Coronary artery disease Brother   . Breast cancer Sister   .  Breast cancer Sister     EXAM: BP 133/61 mmHg  Pulse 73  Temp(Src) 98.3 F (36.8 C) (Oral)  Resp 16  Ht 5\' 3"  (1.6 m)  Wt 130 lb (58.968 kg)  BMI 23.03 kg/m2  SpO2 100% CONSTITUTIONAL: Alert and oriented and responds appropriately to questions. Well-appearing; well-nourished, elderly, pleasant, smiling, in no distress HEAD: Normocephalic EYES: Conjunctivae clear, PERRL ENT: normal nose; no rhinorrhea; moist mucous membranes; pharynx without lesions noted NECK: Supple, no meningismus, no LAD  CARD: RRR; S1 and S2 appreciated; no murmurs, no clicks, no rubs, no gallops RESP: Normal chest excursion without splinting or tachypnea; breath sounds clear and equal bilaterally; no wheezes, no rhonchi, no rales, no hypoxia or respiratory distress, speaking full sentences ABD/GI: Normal bowel sounds; non-distended; soft, non-tender, no rebound, no guarding, no peritoneal signs BACK:  The back appears normal and is non-tender to palpation, there is no CVA tenderness EXT: Normal ROM in all joints; non-tender to palpation; no edema; normal capillary refill; no cyanosis, no calf tenderness or swelling; Left arm is nontender to palpation with no swelling or signs of injury.  Compartments are soft.  2+ radial pulses.  Warm and well-perfused.  No joint effusions. No signs of cellulitis or abscess. SKIN: Normal color for age and race; warm NEURO: Moves all extremities equally, cranial nerves II through XII intact; Decreased sensation in left arm and left leg compared to right side.    PSYCH: The patient's mood  and manner are appropriate. Grooming and personal hygiene are appropriate.    MEDICAL DECISION MAKING: Patient here with left arm pain without any other associated symptoms. Symptoms lasted for 20 minutes and have resolved and she is now completely asymptomatic and hemodynamically stable. Her EKG shows no ischemic changes, interval changes or arrhythmia. Given she does have risk factors for ACS will obtain cardiac labs, chest x-ray. She reports taking aspirin 325 mg at 9 PM.  ED PROGRESS: Patient's troponin is elevated at 0.43 concerning for NSTEMI. Otherwise labs unremarkable. Chest x-ray clear. She is still pain-free. She is still here dynamically stable. We'll start heparin. Discussed with Dr. Onalee Hua who is the cardiology fellow on call for admission to [facility]. He would like the patient admitted to a telemetry bed. He feels telemetry is appropriate given patient is pain-free, hemodynamically stable. Updated patient and her husband.   2:50 AM  Pt transported by [employer]. Still pain-free with normal vital signs.   EKG Interpretation  Date/Time:  Thursday August 17 2014 23:52:28 EDT Ventricular Rate:  64 PR Interval:  132 QRS Duration: 82 QT Interval:  420 QTC Calculation: 433 R Axis:   75 Text Interpretation:  Normal sinus rhythm Normal ECG Confirmed by Seichi Kaufhold,  DO, Lisandro Meggett ([id number]) on 08/18/2014 1:45:44 AM        I personally performed the services described in this documentation, which was scribed in my presence. The recorded information has been reviewed and is accurate.    [name] Maw Jaskirat Schwieger, DO 08/18/14 [phone]

## 2014-08-18 NOTE — Progress Notes (Signed)
Echocardiogram 2D Echocardiogram has been performed.  [name] 08/18/2014, 12:48 PM

## 2014-08-18 NOTE — Progress Notes (Signed)
ANTICOAGULATION CONSULT NOTE - Follow Up Consult  Pharmacy Consult for Heparin and Aggrastat Indication: ACS/STEMI  Allergies  Allergen Reactions  . Ezetimibe Other (See Comments)    Muscle pain= zetia  . Ezetimibe-Simvastatin Other (See Comments)    Muscle pain= vytorin  . Morphine Other (See Comments)    nightmares  . Rosuvastatin Other (See Comments)    Muscle pain  . Ambien [Zolpidem Tartrate] Other (See Comments)    Fell down  . Plaquenil [Hydroxychloroquine Sulfate] Other (See Comments)  . Polytrim [Polymyxin B-Trimethoprim] Other (See Comments)    unknown  . Prevacid [Lansoprazole] Other (See Comments)    dizziness    Patient Measurements: Height: 5\' 3"  (160 cm) Weight: 129 lb 3 oz (58.6 kg) IBW/kg (Calculated) : 52.4 Heparin Dosing Weight: 58.6 kg  Vital Signs: Temp: 98.8 F (37.1 C) (06/03 1457) Temp Source: Oral (06/03 1457) BP: 134/67 mmHg (06/03 1959) Pulse Rate: 73 (06/03 1959)  Labs:  Recent Labs  08/18/14 0011 08/18/14 0910 08/18/14 1148 08/18/14 1415 08/18/14 1918  HGB 11.3*  --   --   --   --   HCT 34.4*  --   --   --   --   PLT 193  --   --   --   --   HEPARINUNFRC  --   --  0.21*  --   --   CREATININE 1.23*  --   --   --   --   TROPONINI 0.43* 0.44*  --  0.31* 0.29*    Estimated Creatinine Clearance: 32.2 mL/min (by C-G formula based on Cr of 1.23).  Assessment:  s/p cardiac cath.  Findings as noted in cath report.  Aggrastat begun in the Cath Lab ~8pm with 25 mcg/kg bolus followed by 0.075 mg/kg/min infusion, appropriate for renal function. To continue Aggrastat x 24hrs. Heparin drip to resume 6 hours after sheath out. TR band deflation to begin ~10pm.  RN reports site is stable.  Plannng back to Cath lab on Monday, 08/21/14, if stable over the weekend.  Goal of Therapy:  Heparin level 0.3-0.5 units/ml appropriate Aggrastat rate for renal function Monitor platelets by anticoagulation protocol: Yes   Plan:   Continue Aggrastat at  0.075 mcg/kg/min x 24 hrs, thru 8pm on 6/4.  Resume heparin drip at 5am on 6/4 at 750 units/hr.  Heparin level ~ 6hrs after drip resumes.  Lower-range heparin level goal while on Aggrastat.  Daily heparin level and CBC while on heparin.  Dennie Fetters, [place] Pager: [phone] 08/18/2014,10:02 PM

## 2014-08-18 NOTE — Interval H&P Note (Signed)
History and Physical Interval Note:  08/18/2014 6:22 PM  [name]  has presented today for surgery, with the diagnosis of NSTEMI. The various methods of treatment have been discussed with the patient and family. After consideration of risks, benefits and other options for treatment, the patient has consented to  Procedure(s): Left Heart Cath and Coronary Angiography (N/A) with possible percutaneous coronary intervention as a surgical intervention .  The patient's history has been reviewed, patient examined, no change in status, stable for surgery.  I have reviewed the patient's chart and labs.  Questions were answered to the patient's satisfaction.    Risks / Complications include, but not limited to: Death, MI, CVA/TIA, VF/VT (with defibrillation), Bradycardia (need for temporary pacer placement), contrast induced nephropathy, bleeding / bruising / hematoma / pseudoaneurysm, vascular or coronary injury (with possible emergent CT or Vascular Surgery), adverse medication reactions, infection.  Additional risks involving the use of radiation with the possibility of radiation burns and cancer were explained in detail.  The patient and familyvoice understanding and agree to proceed.     Dezi Brauner W  Cath Lab Visit (complete for each Cath Lab visit)  Clinical Evaluation Leading to the Procedure:   ACS: Yes.    Non-ACS:    Anginal Classification: CCS IV  Anti-ischemic medical therapy: Maximal Therapy (2 or more classes of medications)  Non-Invasive Test Results: No non-invasive testing performed  Prior CABG: No previous CABG  TIMI Score  Patient Information:  TIMI Score is 5  Revascularization of the presumed culprit artery  A (9)  Indication: 11; Score: 9 TIMI Score  Patient Information:  TIMI Score is 5  Revascularization of multiple coronary arteries when the culprit artery cannot clearly be determined  A (9)  Indication: 12; Score: 9

## 2014-08-18 NOTE — H&P (View-Only) (Signed)
    Subjective:  No further left arm pain.  Objective:  Vital Signs in the last 24 hours: Temp:  [98.3 F (36.8 C)-98.7 F (37.1 C)] 98.7 F (37.1 C) (06/03 0335) Pulse Rate:  [68-82] 82 (06/03 0335) Resp:  [11-18] 11 (06/03 0230) BP: (129-156)/(59-61) 156/59 mmHg (06/03 0335) SpO2:  [97 %-100 %] 100 % (06/03 0718) FiO2 (%):  [21 %] 21 % (06/03 0718) Weight:  [129 lb 3 oz (58.6 kg)-130 lb (58.968 kg)] 129 lb 3 oz (58.6 kg) (06/03 0335)  Intake/Output from previous day: 06/02 0701 - 06/03 0700 In: 325.1 [P.O.:300; I.V.:25.1] Out: 1125 [Urine:1125]  Physical Exam: Pt is alert and oriented, NAD HEENT: normal Neck: JVP - normal Lungs: CTA bilaterally CV: RRR grade 2/6 diastolic decrescendo murmur heard at the left lower sternal border Abd: soft, NT, Positive BS, no hepatomegaly Ext: no C/C/E, distal pulses intact and equal Skin: warm/dry no rash   Lab Results:  Recent Labs  08/18/14 0011  WBC 6.1  HGB 11.3*  PLT 193    Recent Labs  08/18/14 0011  NA 134*  K 4.6  CL 101  CO2 26  GLUCOSE 98  BUN 22*  CREATININE 1.23*    Recent Labs  08/18/14 0011  TROPONINI 0.43*   Assessment/Plan:  79 year old woman with moderate coronary artery disease by cardiac catheterization 10 years ago, now presenting with left arm discomfort and elevated troponin. Her symptoms are suggestive of ACS/non-ST elevation infarction. She is on IV heparin. I think cardiac catheterization and possible PCI or indicated. I have reviewed the risks, indications, and alternatives to cardiac catheterization, angioplasty, and stenting with the patient. Risks include but are not limited to bleeding, infection, vascular injury, stroke, myocardial infection, arrhythmia, kidney injury, radiation-related injury in the case of prolonged fluoroscopy use, emergency cardiac surgery, and death. The patient understands the risks of serious complication is low (<1%).    Tonny Bollman, M.D. 08/18/2014, 9:49  AM

## 2014-08-18 NOTE — H&P (Signed)
HPI: [name] is a pleasant 79 yo woman who follows with Dr. Excell Seltzer who presented to [facility] with cc of left arm pain.  She reports having some issues with numbness of the left arm that has been and continues to be worked up.  However, today she was laying down speaking to a friend over the phone and developed severe left arm pain.  She kept saying "my left arm is killing me".  She describes this as a "dull ache".  Friend advised to call 911 and she did.  Episode resolved on its own and lasted 20 minutes at best.  Work up at [facility] significant for troponin 0.43 which prompted call to cardiology for transfer and admission. She denies any overt symptoms although may have had some SOB and nausea, but minimal.  She also had brief episode of left neck pain. She tells me she checked her BP that normally runs in the 120s and was ~ 170 with HR 80s.     Review of Systems:     Cardiac Review of Systems: {Y] = yes  = no  Chest Pain [    ]  Resting SOB [   ] Exertional SOB  [  ]  Orthopnea [  ]   Pedal Edema [   ]    Palpitations [  ] Syncope  [  ]   Presyncope [   ]  General Review of Systems: [Y] = yes [  ]=no Constitional: recent weight change [  ]; anorexia [  ]; fatigue [  ]; nausea [  ]; night sweats [  ]; fever [  ]; or chills [  ];                                                                      Dental: poor dentition[  ];   Eye : blurred vision [  ]; diplopia [   ]; vision changes [  ];  Amaurosis fugax[  ]; Resp: cough [  ];  wheezing[  ];  hemoptysis[  ]; shortness of breath[  ]; paroxysmal nocturnal dyspnea[  ]; dyspnea on exertion[  ]; or orthopnea[  ];  GI:  gallstones[  ], vomiting[  ];  dysphagia[  ]; melena[  ];  hematochezia [  ]; heartburn[  ];   GU: kidney stones [  ]; hematuria[  ];   dysuria [  ];  nocturia[  ];               Skin: rash [  ], swelling[  ];, hair loss[  ];  peripheral edema[  ];  or itching[  ]; Musculosketetal: myalgias[  ];  joint swelling[  ];  joint erythema[   ];  joint pain[  ];  back pain[  ];  Heme/Lymph: bruising[  ];  bleeding[  ];  anemia[  ];  Neuro: TIA[  ];  headaches[  ];  stroke[  ];  vertigo[  ];  seizures[  ];   paresthesias[  ];  difficulty walking[  ];  Psych:depression[  ]; anxiety[  ];  Endocrine: diabetes[  ];  thyroid dysfunction[  ];  Other:  Past Medical History  Diagnosis Date  . DYSLIPIDEMIA   .  IRON DEFICIENCY ANEMIA SECONDARY TO BLOOD LOSS   . CORONARY ARTERY DISEASE   . Right bundle branch block   . SUPRAVENTRICULAR TACHYCARDIA   . Raynaud's syndrome   . EXTERNAL HEMORRHOIDS   . GERD   . GASTRIC ANTRAL VASCULAR ECTASIA   . ENDOMETRIOSIS   . ACNE ROSACEA   . SJOGREN'S SYNDROME   . OSTEOARTHRITIS   . Rosacea, acne   . COLONIC POLYPS, ADENOMATOUS, HX OF   . HEADACHE, CHRONIC, HX OF   . Headache   . Hypertension   . Insomnia   . Sicca syndrome   . Vitamin D deficiency   . Chronic kidney disease   . Neuropathy   . Diverticulitis   . Paresthesia 08/01/2014  . Raynaud's disease    No current facility-administered medications on file prior to encounter.   Current Outpatient Prescriptions on File Prior to Encounter  Medication Sig Dispense Refill  . amLODipine (NORVASC) 5 MG tablet Take 5 mg by mouth daily.     [name] aspirin 81 MG tablet Take 81 mg by mouth daily.    . Biotin 5000 MCG CAPS Take 1 capsule by mouth daily.    . calcium citrate-vitamin D (CITRACAL+D) 315-200 MG-UNIT per tablet Take 1 tablet by mouth daily.    . Capsicum, Cayenne, (CAYENNE PO) Take 1 capsule by mouth daily as needed (for stomach).     . Cholecalciferol (VITAMIN D3) 2000 UNITS TABS Take 1 capsule by mouth daily.    . Coenzyme Q10 (COQ-10 PO) Take 1 tablet by mouth daily as needed (for vitamin).     . cyclobenzaprine (FLEXERIL) 10 MG tablet Take 10 mg by mouth daily as needed for muscle spasms.    . DULoxetine (CYMBALTA) 20 MG capsule Take 20 mg by mouth daily.    . Evening Primrose Oil 500 MG CAPS Take 500 mg by mouth daily.    .  ferrous sulfate 325 (65 FE) MG tablet Take 325 mg by mouth daily with breakfast.    . Flaxseed, Linseed, (FLAX SEEDS PO) Take 1 tablet by mouth daily.     . fluticasone (FLONASE) 50 MCG/ACT nasal spray Place 2 sprays into both nostrils daily.    . Ginger, Zingiber officinalis, (GINGER ROOT) 250 MG CAPS Take 250 mg by mouth daily.    [name] ibuprofen (ADVIL,MOTRIN) 200 MG tablet Take 200 mg by mouth every 6 (six) hours as needed.    [name] ketotifen (REFRESH EYE ITCH RELIEF) 0.025 % ophthalmic solution Place 2 drops into both eyes 2 (two) times daily as needed (for dry eyes).    . Multiple Vitamin (MULTIVITAMIN) capsule Take 1 capsule by mouth daily.     . nitroGLYCERIN (NITROSTAT) 0.4 MG SL tablet Place 1 tablet (0.4 mg total) under the tongue every 5 (five) minutes as needed for chest pain. 30 tablet 1  . Nutritional Supplements (GRAPESEED EXTRACT PO) Take 1 tablet by mouth daily.     . temazepam (RESTORIL) 15 MG capsule Take 15 mg by mouth at bedtime as needed for sleep. SLEEP AID    . Turmeric 500 MG CAPS Take 1 capsule by mouth daily.      Allergies  Allergen Reactions  . Ezetimibe Other (See Comments)    Muscle pain= zetia  . Ezetimibe-Simvastatin Other (See Comments)    Muscle pain= vytorin  . Morphine Other (See Comments)    nightmares  . Rosuvastatin Other (See Comments)    Muscle pain  . Ambien [Zolpidem Tartrate] Other (See Comments)  Fell down  . Plaquenil [Hydroxychloroquine Sulfate] Other (See Comments)  . Polytrim [Polymyxin B-Trimethoprim] Other (See Comments)    unknown  . Prevacid [Lansoprazole] Other (See Comments)    dizziness    History   Social History  . Marital Status: Married    Spouse Name: N/A  . Number of Children: 3  . Years of Education: college   Occupational History  . retired    Social History Main Topics  . Smoking status: Never Smoker   . Smokeless tobacco: Never Used  . Alcohol Use: No     Comment: occasional// 1 drink a month  . Drug Use: No   . Sexual Activity: Not on file   Other Topics Concern  . Not on file   Social History Narrative   Patient drinks caffeine occasionally.   Patient is right handed.       Family History  Problem Relation Age of Onset  . Heart failure Mother   . Diabetes Mother   . Kidney failure Mother   . Hypertension Mother   . Coronary artery disease Mother   . Heart disease Mother   . Hyperlipidemia Mother   . Peripheral vascular disease Mother     amputation  . Breast cancer Sister     x 3 sisters  . Cancer Sister   . Heart disease Father   . Hypertension Father   . Coronary artery disease Father   . Heart attack Father   . Breast cancer Daughter   . Cancer Daughter   . Colon cancer Neg Hx   . Esophageal cancer Neg Hx   . Stomach cancer Neg Hx   . Rectal cancer Neg Hx   . Coronary artery disease Brother   . Heart disease Brother   . Coronary artery disease Brother   . Coronary artery disease Brother   . Breast cancer Sister   . Breast cancer Sister     PHYSICAL EXAM: Filed Vitals:   08/18/14 0335  BP: 156/59  Pulse: 82  Temp: 98.7 F (37.1 C)  Resp:    General:  Well appearing. No respiratory difficulty HEENT: normal Neck: supple. no JVD. Carotids 2+ bilat; no bruits. No lymphadenopathy or thryomegaly appreciated. Cor: PMI nondisplaced. Regular rate & rhythm. No rubs, gallops or murmurs. Lungs: clear Abdomen: soft, nontender, nondistended. No hepatosplenomegaly. No bruits or masses. Good bowel sounds. Extremities: no cyanosis, clubbing, rash, edema Neuro: alert & oriented x 3, cranial nerves grossly intact. moves all 4 extremities w/o difficulty. Affect pleasant.  ECG: normal  Results for orders placed or performed during the hospital encounter of 08/17/14 (from the past 24 hour(s))  CBC with Differential     Status: Abnormal   Collection Time: 08/18/14 12:11 AM  Result Value Ref Range   WBC 6.1 4.0 - 10.5 K/uL   RBC 4.12 3.87 - 5.11 MIL/uL   Hemoglobin 11.3  (L) 12.0 - 15.0 g/dL   HCT 32.3 (L) 55.7 - 32.2 %   MCV 83.5 78.0 - 100.0 fL   MCH 27.4 26.0 - 34.0 pg   MCHC 32.8 30.0 - 36.0 g/dL   RDW 02.5 42.7 - 06.2 %   Platelets 193 150 - 400 K/uL   Neutrophils Relative % 61 43 - 77 %   Neutro Abs 3.7 1.7 - 7.7 K/uL   Lymphocytes Relative 22 12 - 46 %   Lymphs Abs 1.3 0.7 - 4.0 K/uL   Monocytes Relative 14 (H) 3 - 12 %   Monocytes  Absolute 0.8 0.1 - 1.0 K/uL   Eosinophils Relative 3 0 - 5 %   Eosinophils Absolute 0.2 0.0 - 0.7 K/uL   Basophils Relative 0 0 - 1 %   Basophils Absolute 0.0 0.0 - 0.1 K/uL  Basic metabolic panel     Status: Abnormal   Collection Time: 08/18/14 12:11 AM  Result Value Ref Range   Sodium 134 (L) 135 - 145 mmol/L   Potassium 4.6 3.5 - 5.1 mmol/L   Chloride 101 101 - 111 mmol/L   CO2 26 22 - 32 mmol/L   Glucose, Bld 98 65 - 99 mg/dL   BUN 22 (H) 6 - 20 mg/dL   Creatinine, Ser 0.98 (H) 0.44 - 1.00 mg/dL   Calcium 9.6 8.9 - 11.9 mg/dL   GFR calc non Af Amer 42 (L) >60 mL/min   GFR calc Af Amer 48 (L) >60 mL/min   Anion gap 7 5 - 15  Troponin I     Status: Abnormal   Collection Time: 08/18/14 12:11 AM  Result Value Ref Range   Troponin I 0.43 (H) <0.031 ng/mL   Dg Chest 2 View  08/18/2014   CLINICAL DATA:  Severe left arm pain and numbness.  EXAM: CHEST  2 VIEW  COMPARISON:  08/09/2013  FINDINGS: The cardiomediastinal contours are normal. Mild atherosclerosis of the aorta. Pulmonary vasculature is normal. No consolidation, pleural effusion, or pneumothorax. No acute osseous abnormalities are seen.  IMPRESSION: No acute pulmonary process.   Electronically Signed   By: Rubye Oaks M.D.   On: 08/18/2014 01:26     ASSESSMENT: 79 yo woman with no known cardiac disease who presented with left arm pain at rest that resolved witin 20 minutes found to have a minimally elevated troponin at [facility] raising concern for NSTEMI.   PLAN/DISCUSSION: Admit to tele ASA, heparin drip Cycle troponins A1c, lipid and TSH Will  get echocardiogram at some point tomorrow She reports being intolerant to statins.   Start low dose BB given BP and HR.  If repeat troponin is abnormal, would likely pursue cath in AM.

## 2014-08-18 NOTE — Progress Notes (Signed)
ANTICOAGULATION CONSULT NOTE - Initial Consult  Pharmacy Consult for Heparin Indication: chest pain/ACS  Allergies  Allergen Reactions  . Ezetimibe Other (See Comments)    Muscle pain= zetia  . Ezetimibe-Simvastatin Other (See Comments)    Muscle pain= vytorin  . Morphine Other (See Comments)    nightmares  . Rosuvastatin Other (See Comments)    Muscle pain  . Ambien [Zolpidem Tartrate] Other (See Comments)    Fell down  . Plaquenil [Hydroxychloroquine Sulfate] Other (See Comments)  . Polytrim [Polymyxin B-Trimethoprim] Other (See Comments)    unknown  . Prevacid [Lansoprazole] Other (See Comments)    dizziness    Patient Measurements: Height: 5\' 3"  (160 cm) Weight: 129 lb 3 oz (58.6 kg) IBW/kg (Calculated) : 52.4 Heparin Dosing Weight: 58 kg  Vital Signs: Temp: 98.7 F (37.1 C) (06/03 0335) Temp Source: Oral (06/03 0335) BP: 156/59 mmHg (06/03 0335) Pulse Rate: 82 (06/03 0335)  Labs:  Recent Labs  08/18/14 0011  HGB 11.3*  HCT 34.4*  PLT 193  CREATININE 1.23*  TROPONINI 0.43*    Estimated Creatinine Clearance: 32.2 mL/min (by C-G formula based on Cr of 1.23).   Medical History: Past Medical History  Diagnosis Date  . DYSLIPIDEMIA   . IRON DEFICIENCY ANEMIA SECONDARY TO BLOOD LOSS   . CORONARY ARTERY DISEASE   . Right bundle branch block   . SUPRAVENTRICULAR TACHYCARDIA   . Raynaud's syndrome   . EXTERNAL HEMORRHOIDS   . GERD   . GASTRIC ANTRAL VASCULAR ECTASIA   . ENDOMETRIOSIS   . ACNE ROSACEA   . SJOGREN'S SYNDROME   . OSTEOARTHRITIS   . Rosacea, acne   . COLONIC POLYPS, ADENOMATOUS, HX OF   . HEADACHE, CHRONIC, HX OF   . Headache   . Hypertension   . Insomnia   . Sicca syndrome   . Vitamin D deficiency   . Chronic kidney disease   . Neuropathy   . Diverticulitis   . Paresthesia 08/01/2014  . Raynaud's disease     Medications:  Prescriptions prior to admission  Medication Sig Dispense Refill Last Dose  . amLODipine (NORVASC) 5 MG  tablet Take 5 mg by mouth daily.    Taking  . aspirin 81 MG tablet Take 81 mg by mouth daily.     . Biotin 5000 MCG CAPS Take 1 capsule by mouth daily.   Taking  . calcium citrate-vitamin D (CITRACAL+D) 315-200 MG-UNIT per tablet Take 1 tablet by mouth daily.   Taking  . Capsicum, Cayenne, (CAYENNE PO) Take 1 capsule by mouth daily as needed (for stomach).    Taking  . Cholecalciferol (VITAMIN D3) 2000 UNITS TABS Take 1 capsule by mouth daily.   Taking  . Coenzyme Q10 (COQ-10 PO) Take 1 tablet by mouth daily as needed (for vitamin).    Taking  . cyclobenzaprine (FLEXERIL) 10 MG tablet Take 10 mg by mouth daily as needed for muscle spasms.   Taking  . DULoxetine (CYMBALTA) 20 MG capsule Take 20 mg by mouth daily.   Taking  . Evening Primrose Oil 500 MG CAPS Take 500 mg by mouth daily.   Taking  . ferrous sulfate 325 (65 FE) MG tablet Take 325 mg by mouth daily with breakfast.   Taking  . Flaxseed, Linseed, (FLAX SEEDS PO) Take 1 tablet by mouth daily.    Taking  . fluticasone (FLONASE) 50 MCG/ACT nasal spray Place 2 sprays into both nostrils daily.   Taking  . Ginger, Zingiber officinalis, (GINGER  ROOT) 250 MG CAPS Take 250 mg by mouth daily.   Taking  . ibuprofen (ADVIL,MOTRIN) 200 MG tablet Take 200 mg by mouth every 6 (six) hours as needed.   Taking  . ketotifen (REFRESH EYE ITCH RELIEF) 0.025 % ophthalmic solution Place 2 drops into both eyes 2 (two) times daily as needed (for dry eyes).   Taking  . Multiple Vitamin (MULTIVITAMIN) capsule Take 1 capsule by mouth daily.    Taking  . nitroGLYCERIN (NITROSTAT) 0.4 MG SL tablet Place 1 tablet (0.4 mg total) under the tongue every 5 (five) minutes as needed for chest pain. 30 tablet 1 Taking  . Nutritional Supplements (GRAPESEED EXTRACT PO) Take 1 tablet by mouth daily.    Taking  . temazepam (RESTORIL) 15 MG capsule Take 15 mg by mouth at bedtime as needed for sleep. SLEEP AID   Taking  . Turmeric 500 MG CAPS Take 1 capsule by mouth daily.    Taking    Assessment: 79 y.o. female presented to [facility] with CP. Troponin 0.43. Heparin gtt started at 700 units/hr (12 units/kg/hr) ~0230. Pt transferred to [facility] and orders to continue heparin per pharmacy. CBC stable at baseline.  Goal of Therapy:  Heparin level 0.3-0.7 units/ml Monitor platelets by anticoagulation protocol: Yes   Plan:  Continue heparin at 700 units/hr Will f/u 8 hr heparin level Daily heparin level and CBC  Christoper Fabian, PharmD, BCPS Clinical pharmacist, pager [phone] 08/18/2014,6:27 AM

## 2014-08-18 NOTE — ED Notes (Signed)
Pt request that any and all information be shared with her son  Dr. Forde Dandy.  She doesn't have her cell phone with her but his # starts with [phone]

## 2014-08-18 NOTE — Progress Notes (Signed)
Pt Heparin drip stopped per protocol; pt transported off unit to cath lab for procedure. [name] Tila Millirons RN.

## 2014-08-18 NOTE — Progress Notes (Signed)
Patient in Holding pre cath. Husband in room. Dr. Herbie Baltimore in talking w/patient. Denies discomfort. 0.9NS infusing at 50cc/hr. Alert and oriented.

## 2014-08-18 NOTE — ED Notes (Signed)
Report called to [facility] 2000 at [facility]

## 2014-08-18 NOTE — Progress Notes (Signed)
ANTICOAGULATION CONSULT NOTE - FOLLOW UP Pharmacy Consult for Heparin Indication: chest pain/ACS  Allergies  Allergen Reactions  . Ezetimibe Other (See Comments)    Muscle pain= zetia  . Ezetimibe-Simvastatin Other (See Comments)    Muscle pain= vytorin  . Morphine Other (See Comments)    nightmares  . Rosuvastatin Other (See Comments)    Muscle pain  . Ambien [Zolpidem Tartrate] Other (See Comments)    Fell down  . Plaquenil [Hydroxychloroquine Sulfate] Other (See Comments)  . Polytrim [Polymyxin B-Trimethoprim] Other (See Comments)    unknown  . Prevacid [Lansoprazole] Other (See Comments)    dizziness    Patient Measurements: Height: 5\' 3"  (160 cm) Weight: 129 lb 3 oz (58.6 kg) IBW/kg (Calculated) : 52.4 Heparin Dosing Weight: 58 kg  Vital Signs: Temp: 98.7 F (37.1 C) (06/03 0335) Temp Source: Oral (06/03 0335) BP: 162/60 mmHg (06/03 1027) Pulse Rate: 82 (06/03 0335)  Labs:  Recent Labs  08/18/14 0011 08/18/14 0910 08/18/14 1148  HGB 11.3*  --   --   HCT 34.4*  --   --   PLT 193  --   --   HEPARINUNFRC  --   --  0.21*  CREATININE 1.23*  --   --   TROPONINI 0.43* 0.44*  --     Estimated Creatinine Clearance: 32.2 mL/min (by C-G formula based on Cr of 1.23).  Assessment: 79 y.o. female admitted 08/17/2014  to [facility] with CP.   Coag: ACS, Troponin 0.43. Heparin gtt started at 700 units/hr (12 units/kg/hr) ~0230. Heparin level reported less than goal. CBC stable at baseline. No bleeding noted.  Goal of Therapy:  Heparin level 0.3-0.7 units/ml Monitor platelets by anticoagulation protocol: Yes   Plan:  Increase heparin to 750 units/hr Follow up after cath for ongoing plan.  Thank you for allowing pharmacy to be a part of this patients care team.  Lovenia Kim Pharm.D., BCPS, AQ-Cardiology Clinical Pharmacist 08/18/2014 12:27 PM Pager: [phone] Phone: [phone]

## 2014-08-19 ENCOUNTER — Encounter (HOSPITAL_COMMUNITY): Payer: Self-pay | Admitting: *Deleted

## 2014-08-19 DIAGNOSIS — Z889 Allergy status to unspecified drugs, medicaments and biological substances status: Secondary | ICD-10-CM

## 2014-08-19 LAB — BASIC METABOLIC PANEL
ANION GAP: 10 (ref 5–15)
BUN: 14 mg/dL (ref 6–20)
CALCIUM: 8.7 mg/dL — AB (ref 8.9–10.3)
CO2: 19 mmol/L — ABNORMAL LOW (ref 22–32)
CREATININE: 0.82 mg/dL (ref 0.44–1.00)
Chloride: 106 mmol/L (ref 101–111)
GLUCOSE: 91 mg/dL (ref 65–99)
POTASSIUM: 4.1 mmol/L (ref 3.5–5.1)
Sodium: 135 mmol/L (ref 135–145)

## 2014-08-19 LAB — HEPARIN LEVEL (UNFRACTIONATED)
Heparin Unfractionated: 0.32 IU/mL (ref 0.30–0.70)
Heparin Unfractionated: 0.12 IU/mL — ABNORMAL LOW (ref 0.30–0.70)

## 2014-08-19 LAB — CBC
HCT: 33 % — ABNORMAL LOW (ref 36.0–46.0)
Hemoglobin: 10.8 g/dL — ABNORMAL LOW (ref 12.0–15.0)
MCH: 27.3 pg (ref 26.0–34.0)
MCHC: 32.7 g/dL (ref 30.0–36.0)
MCV: 83.3 fL (ref 78.0–100.0)
Platelets: 202 10*3/uL (ref 150–400)
RBC: 3.96 MIL/uL (ref 3.87–5.11)
RDW: 14.7 % (ref 11.5–15.5)
WBC: 5.5 10*3/uL (ref 4.0–10.5)

## 2014-08-19 LAB — HEMOGLOBIN A1C
HEMOGLOBIN A1C: 5.7 % — AB (ref 4.8–5.6)
Mean Plasma Glucose: 117 mg/dL

## 2014-08-19 LAB — LIPID PANEL
CHOLESTEROL: 162 mg/dL (ref 0–200)
HDL: 54 mg/dL (ref >40)
LDL Cholesterol: 88 mg/dL (ref 0–99)
TRIGLYCERIDES: 98 mg/dL (ref <150)
Total CHOL/HDL Ratio: 3 RATIO
VLDL: 20 mg/dL (ref 0–40)

## 2014-08-19 LAB — MRSA PCR SCREENING: MRSA BY PCR: NEGATIVE

## 2014-08-19 NOTE — Progress Notes (Signed)
Subjective:  No chest pain, radial site normal. Left arm pain was anginal symptom.   Objective:  Vital Signs in the last 24 hours: Temp:  [97.6 F (36.4 C)-98.8 F (37.1 C)] 97.6 F (36.4 C) (06/04 0826) Pulse Rate:  [0-102] 64 (06/04 0826) Resp:  [0-26] 18 (06/04 0826) BP: (86-162)/(32-136) 105/53 mmHg (06/04 0826) SpO2:  [0 %-100 %] 99 % (06/04 0826) Weight:  [125 lb 10.6 oz (57 kg)-130 lb 15.3 oz (59.4 kg)] 130 lb 15.3 oz (59.4 kg) (06/04 0411)  Intake/Output from previous day: 06/03 0701 - 06/04 0700 In: 504.2 [I.V.:504.2] Out: 990 [Urine:990]   Physical Exam: General: Well developed, well nourished, in no acute distress. Head:  Normocephalic and atraumatic. Lungs: Clear to auscultation and percussion. Heart: Normal S1 and S2.  No murmur, rubs or gallops.  Abdomen: soft, non-tender, positive bowel sounds. Extremities: No clubbing or cyanosis. No edema. Radial site normal Neurologic: Alert and oriented x 3.    Lab Results:  Recent Labs  08/18/14 0011 08/19/14 0255  WBC 6.1 5.5  HGB 11.3* 10.8*  PLT 193 202    Recent Labs  08/18/14 0011 08/19/14 0255  NA 134* 135  K 4.6 4.1  CL 101 106  CO2 26 19*  GLUCOSE 98 91  BUN 22* 14  CREATININE 1.23* 0.82    Recent Labs  08/18/14 1415 08/18/14 1918  TROPONINI 0.31* 0.29*   Hepatic Functi  Recent Labs  08/19/14 0255  CHOL 162     Imaging: Dg Chest 2 View  08/18/2014   CLINICAL DATA:  Severe left arm pain and numbness.  EXAM: CHEST  2 VIEW  COMPARISON:  08/09/2013  FINDINGS: The cardiomediastinal contours are normal. Mild atherosclerosis of the aorta. Pulmonary vasculature is normal. No consolidation, pleural effusion, or pneumothorax. No acute osseous abnormalities are seen.  IMPRESSION: No acute pulmonary process.   Electronically Signed   By: Rubye Oaks M.D.   On: 08/18/2014 01:26   Personally viewed.   Telemetry: No adverse rhythms Personally viewed.   EKG:  6/4: TWI inferior leads  (ischemia), prior 6/3 at 2014 minimal ST elevation inf leads, prior 6/2 - normal ecg.   Cardiac Studies:  Cath report (Dr. Herbie Baltimore) 1. Severe focal stenosis of the proximal RPDA with moderate to Severe disease in the proximal LAD. 2. RPDA lesion, 85% stenosed. After attempted PTCA with wire related dissection, there is a 99% residual stenosis post intervention attempt but with TIMI 2 flow and no anginal pain. 3. Unsuccessful Attempted PTCA of rPDA with resulting Dissection but with restored flow after wire removal - TIMI 2 flow. 4. Prox LAD lesion, ~60% stenosed (somewhat hazy shelflike lesion at the takeoff of a First Diagonal with a 60% ostial stenosis and major septal perforator trunk that has a 90% ostial stenosis) 5. Large-caliber ramus intermedius that courses as a large OM branch with moderate diffuse disease. 6. Preserved/low normal EF roughly 50-55% with apical hypo-kinesis/akinesis. 7. Normal LVEDP  Despite presence of the extensive dissection in the PDA with only TIMI 2 flow, the patient remained angina (left arm pain) free and hemodynamically stable throughout the entire procedure despite dissection and poor flow down the PDA.  Recommendations (after discussing with Dr. Daphene Jaeger who was present to review the films, and Dr. Tonny Bollman via telephone):  Transfer to CCU for closer monitoring. Standard radial cath TR band removal. She will stay bedrest.  Initiate Aggrastat infusion, restart heparin infusion 6 hours after TR band removal. Will not  continue Brilinta until we are clear if PCI will be performed  Plan will be to perform a relook angiography on Monday morning if she remains stable over the weekend. At that time we will determine the level of flow in the PDA and also consider evaluating the LAD lesion with FFR versus IVUS. Potential course of action following this evaluation would be reattempted PCI on the PDA, medical management only versus considering possible 2 vessel  CABG.  IV nitroglycerin infusion since the vessel flow "perked up "after intracoronary nitroglycerin injection  Spent roughly 15 minutes discussing the results with the patient and her husband. Then called and talked to her son ([name]) and discussed again with him the initial findings the attempted PTCA with results and potential future plans.  ECHO: 08/18/14 - Left ventricle: There is mild/moderate hypokinesis of the mid/apical inferior segments. The cavity size was normal. Wall thickness was normal. The estimated ejection fraction was 55%. - Aortic valve: Sclerosis without stenosis. There was mild regurgitation. - Right ventricle: The cavity size was normal. Systolic function was normal. - Pulmonary arteries: PA peak pressure: 43 mm Hg (S).  Scheduled Meds: . amLODipine  5 mg Oral Daily  . aspirin  324 mg Oral Once  . aspirin  81 mg Oral Daily  . DULoxetine  20 mg Oral Daily  . metoprolol succinate  12.5 mg Oral Daily  . sodium chloride  3 mL Intravenous Q12H   Continuous Infusions: . heparin 750 Units/hr (08/19/14 0800)  . nitroGLYCERIN Stopped (08/19/14 0200)  . tirofiban 0.075 mcg/kg/min (08/19/14 0800)   PRN Meds:.sodium chloride, acetaminophen, nitroGLYCERIN, ondansetron (ZOFRAN) IV, sodium chloride, temazepam  Assessment/Plan:  Active Problems:   NSTEMI (non-ST elevated myocardial infarction)  NSTEMI  - TWI inferior leads this AM. Yesterday evening post cath subtle ST elevation inferior leads  - No anginal symptoms.   - IV aggrastat for 24 complete this evening (no signs of bleeding)  - IV heparin  - statin intolerance previously. Not on currently. LDL 88  - Bb low dose  - Trop 0.31  Plan is to perform cath on Monday.  (MRI by Dr. Anne Hahn on hold - planned for Monday)  CAD  - as above  - dissection PDA with TIMI 2 flow  - LAD Ca ++ lesion (plan FFR)  Statin intolerance  - as above  - LDL 88    Rockland Kotarski 08/19/2014, 8:47 AM

## 2014-08-19 NOTE — Progress Notes (Signed)
ANTICOAGULATION CONSULT NOTE - Follow Up Consult  Pharmacy Consult for heparin Indication: ACS/STEMI  Allergies  Allergen Reactions  . Ezetimibe Other (See Comments)    Muscle pain= zetia  . Ezetimibe-Simvastatin Other (See Comments)    Muscle pain= vytorin  . Morphine Other (See Comments)    nightmares  . Rosuvastatin Other (See Comments)    Muscle pain  . Ambien [Zolpidem Tartrate] Other (See Comments)    Fell down  . Plaquenil [Hydroxychloroquine Sulfate] Other (See Comments)  . Polytrim [Polymyxin B-Trimethoprim] Other (See Comments)    unknown  . Prevacid [Lansoprazole] Other (See Comments)    dizziness    Patient Measurements: Height: 5\' 3"  (160 cm) Weight: 130 lb 15.3 oz (59.4 kg) IBW/kg (Calculated) : 52.4 Heparin Dosing Weight: 58 kg  Vital Signs: Temp: 98 F (36.7 C) (06/04 2018) Temp Source: Oral (06/04 2018) BP: 97/29 mmHg (06/04 2200) Pulse Rate: 64 (06/04 2200)  Labs:  Recent Labs  08/18/14 0011 08/18/14 0910 08/18/14 1148 08/18/14 1415 08/18/14 1918 08/19/14 0255 08/19/14 1225 08/19/14 2128  HGB 11.3*  --   --   --   --  10.8*  --   --   HCT 34.4*  --   --   --   --  33.0*  --   --   PLT 193  --   --   --   --  202  --   --   HEPARINUNFRC  --   --  0.21*  --   --   --  0.32 0.12*  CREATININE 1.23*  --   --   --   --  0.82  --   --   TROPONINI 0.43* 0.44*  --  0.31* 0.29*  --   --   --     Estimated Creatinine Clearance: 48.3 mL/min (by C-G formula based on Cr of 0.82).   Assessment: 79 yo f s/p cardiac cath 6/3 - dissection during PTCA attempt but stabilized. Heparin restarted post-cath. Plan back to cath on Monday to relook. Heparin level down to 0.12 (subtherapeutic) on 750 units/hr. No issues noted with bleeding or line per RN.   Aggrastat off at 10pm per RN.  Goal of Therapy:  Heparin level 0.3-0.5 units/mL while on Aggrastat Monitor platelets by anticoagulation protocol: Yes   Plan:  Increase heparin to 900 units/hr F/u 8 hr  heparin level Daily heparin level and CBC  Christoper Fabian, PharmD, BCPS Clinical pharmacist, pager [phone] 08/19/2014 10:55 PM

## 2014-08-19 NOTE — Progress Notes (Signed)
ANTICOAGULATION CONSULT NOTE - Follow Up Consult  Pharmacy Consult for heparin Indication: ACS/STEMI  Allergies  Allergen Reactions  . Ezetimibe Other (See Comments)    Muscle pain= zetia  . Ezetimibe-Simvastatin Other (See Comments)    Muscle pain= vytorin  . Morphine Other (See Comments)    nightmares  . Rosuvastatin Other (See Comments)    Muscle pain  . Ambien [Zolpidem Tartrate] Other (See Comments)    Fell down  . Plaquenil [Hydroxychloroquine Sulfate] Other (See Comments)  . Polytrim [Polymyxin B-Trimethoprim] Other (See Comments)    unknown  . Prevacid [Lansoprazole] Other (See Comments)    dizziness    Patient Measurements: Height: 5\' 3"  (160 cm) Weight: 130 lb 15.3 oz (59.4 kg) IBW/kg (Calculated) : 52.4 Heparin Dosing Weight: 58 kg  Vital Signs: Temp: 98.2 F (36.8 C) (06/04 1207) Temp Source: Oral (06/04 1207) BP: 148/54 mmHg (06/04 1218) Pulse Rate: 63 (06/04 1207)  Labs:  Recent Labs  08/18/14 0011 08/18/14 0910 08/18/14 1148 08/18/14 1415 08/18/14 1918 08/19/14 0255 08/19/14 1225  HGB 11.3*  --   --   --   --  10.8*  --   HCT 34.4*  --   --   --   --  33.0*  --   PLT 193  --   --   --   --  202  --   HEPARINUNFRC  --   --  0.21*  --   --   --  0.32  CREATININE 1.23*  --   --   --   --  0.82  --   TROPONINI 0.43* 0.44*  --  0.31* 0.29*  --   --     Estimated Creatinine Clearance: 48.3 mL/min (by C-G formula based on Cr of 0.82).   Assessment: 79 yo f s/p cardiac cath. Patient is on heparin and pharmacy is consulted to dose.  The patient is also on Aggrastat until ~8pm tonight. HL this AM is therapeutic at 0.32 on 750 units/hr. CBC stable. No issues noted.   Goal of Therapy:  Heparin level 0.3-0.5 units/mL while on Aggrastat Monitor platelets by anticoagulation protocol: Yes   Plan:  Continue heparin at 750 units/hr 8-hr HL @ 2130 Daily HL Monitor cbc, s/s of bleeding, clinical course  Cassie L. Roseanne Reno, PharmD Clinical Pharmacy  Resident Pager: [phone] 08/19/2014 1:33 PM

## 2014-08-20 LAB — HEPARIN LEVEL (UNFRACTIONATED)
Heparin Unfractionated: 0.53 IU/mL (ref 0.30–0.70)
Heparin Unfractionated: 0.37 IU/mL (ref 0.30–0.70)

## 2014-08-20 LAB — CBC
HCT: 34.2 % — ABNORMAL LOW (ref 36.0–46.0)
Hemoglobin: 11.2 g/dL — ABNORMAL LOW (ref 12.0–15.0)
MCH: 27.1 pg (ref 26.0–34.0)
MCHC: 32.7 g/dL (ref 30.0–36.0)
MCV: 82.8 fL (ref 78.0–100.0)
PLATELETS: 183 10*3/uL (ref 150–400)
RBC: 4.13 MIL/uL (ref 3.87–5.11)
RDW: 14.7 % (ref 11.5–15.5)
WBC: 5.2 10*3/uL (ref 4.0–10.5)

## 2014-08-20 NOTE — Progress Notes (Signed)
Cards: Dr. Excell Seltzer  Subjective:  No chest pain, radial site normal. Left arm pain was anginal symptom. Wanting to know when she is going back to cath lab.   Objective:  Vital Signs in the last 24 hours: Temp:  [97.5 F (36.4 C)-98.2 F (36.8 C)] 97.5 F (36.4 C) (06/05 0748) Pulse Rate:  [42-77] 57 (06/05 0748) Resp:  [10-21] 18 (06/05 0748) BP: (83-148)/(29-76) 126/47 mmHg (06/05 0748) SpO2:  [89 %-100 %] 100 % (06/05 0748)  Intake/Output from previous day: 06/04 0701 - 06/05 0700 In: 535.9 [P.O.:240; I.V.:295.9] Out: 1300 [Urine:1300]   Physical Exam: General: Well developed, well nourished, in no acute distress. Head:  Normocephalic and atraumatic. Lungs: Clear to auscultation and percussion. Heart: Normal S1 and S2.  No murmur, rubs or gallops.  Abdomen: soft, non-tender, positive bowel sounds. Extremities: No clubbing or cyanosis. No edema. Radial site normal Neurologic: Alert and oriented x 3.    Lab Results:  Recent Labs  08/19/14 0255 08/20/14 0409  WBC 5.5 5.2  HGB 10.8* 11.2*  PLT 202 183    Recent Labs  08/18/14 0011 08/19/14 0255  NA 134* 135  K 4.6 4.1  CL 101 106  CO2 26 19*  GLUCOSE 98 91  BUN 22* 14  CREATININE 1.23* 0.82    Recent Labs  08/18/14 1415 08/18/14 1918  TROPONINI 0.31* 0.29*   Hepatic Functi  Recent Labs  08/19/14 0255  CHOL 162      Telemetry: No adverse rhythms Personally viewed.   EKG:  6/4: TWI inferior leads (ischemia), prior 6/3 at 2014 minimal ST elevation inf leads, prior 6/2 - normal ecg.   Cardiac Studies:  Cath report (Dr. Herbie Baltimore) 1. Severe focal stenosis of the proximal RPDA with moderate to Severe disease in the proximal LAD. 2. RPDA lesion, 85% stenosed. After attempted PTCA with wire related dissection, there is a 99% residual stenosis post intervention attempt but with TIMI 2 flow and no anginal pain. 3. Unsuccessful Attempted PTCA of rPDA with resulting Dissection but with restored flow  after wire removal - TIMI 2 flow. 4. Prox LAD lesion, ~60% stenosed (somewhat hazy shelflike lesion at the takeoff of a First Diagonal with a 60% ostial stenosis and major septal perforator trunk that has a 90% ostial stenosis) 5. Large-caliber ramus intermedius that courses as a large OM branch with moderate diffuse disease. 6. Preserved/low normal EF roughly 50-55% with apical hypo-kinesis/akinesis. 7. Normal LVEDP  Despite presence of the extensive dissection in the PDA with only TIMI 2 flow, the patient remained angina (left arm pain) free and hemodynamically stable throughout the entire procedure despite dissection and poor flow down the PDA.  Recommendations (after discussing with Dr. Daphene Jaeger who was present to review the films, and Dr. Tonny Bollman via telephone):  Transfer to CCU for closer monitoring. Standard radial cath TR band removal. She will stay bedrest.  Initiate Aggrastat infusion, restart heparin infusion 6 hours after TR band removal. Will not continue Brilinta until we are clear if PCI will be performed  Plan will be to perform a relook angiography on Monday morning if she remains stable over the weekend. At that time we will determine the level of flow in the PDA and also consider evaluating the LAD lesion with FFR versus IVUS. Potential course of action following this evaluation would be reattempted PCI on the PDA, medical management only versus considering possible 2 vessel CABG.  IV nitroglycerin infusion since the vessel flow "perked up "after intracoronary nitroglycerin  injection  Spent roughly 15 minutes discussing the results with the patient and her husband. Then called and talked to her son ([name]) and discussed again with him the initial findings the attempted PTCA with results and potential future plans.  ECHO: 08/18/14 - Left ventricle: There is mild/moderate hypokinesis of the mid/apical inferior segments. The cavity size was normal.  Wall thickness was normal. The estimated ejection fraction was 55%. - Aortic valve: Sclerosis without stenosis. There was mild regurgitation. - Right ventricle: The cavity size was normal. Systolic function was normal. - Pulmonary arteries: PA peak pressure: 43 mm Hg (S).  Scheduled Meds: . amLODipine  5 mg Oral Daily  . aspirin  324 mg Oral Once  . aspirin  81 mg Oral Daily  . DULoxetine  20 mg Oral Daily  . metoprolol succinate  12.5 mg Oral Daily  . sodium chloride  3 mL Intravenous Q12H   Continuous Infusions: . heparin 900 Units/hr (08/20/14 0150)  . nitroGLYCERIN Stopped (08/19/14 0200)   PRN Meds:.sodium chloride, acetaminophen, nitroGLYCERIN, ondansetron (ZOFRAN) IV, sodium chloride, temazepam  Assessment/Plan:  Active Problems:   NSTEMI (non-ST elevated myocardial infarction)  NSTEMI  - TWI inferior leads this AM. Yesterday evening post cath subtle ST elevation inferior leads  - No anginal symptoms.   - IV aggrastat for 24 complete this evening (no signs of bleeding)  - IV heparin  - statin intolerance previously. Not on currently. LDL 88  - Bb low dose  - Trop 0.31  Plan is to perform cath on Monday.  (MRI by Dr. Anne Hahn on hold - planned for Monday)  CAD  - as above  - dissection PDA with TIMI 2 flow  - LAD Ca ++ lesion (plan FFR)  Statin intolerance  - as above  - LDL 88    SKAINS, MARK 08/20/2014, 9:24 AM

## 2014-08-20 NOTE — Progress Notes (Signed)
ANTICOAGULATION CONSULT NOTE - Follow Up Consult  Pharmacy Consult for heparin Indication: ACS/STEMI  Allergies  Allergen Reactions  . Ezetimibe Other (See Comments)    Muscle pain= zetia  . Ezetimibe-Simvastatin Other (See Comments)    Muscle pain= vytorin  . Morphine Other (See Comments)    nightmares  . Rosuvastatin Other (See Comments)    Muscle pain  . Ambien [Zolpidem Tartrate] Other (See Comments)    Fell down  . Plaquenil [Hydroxychloroquine Sulfate] Other (See Comments)  . Polytrim [Polymyxin B-Trimethoprim] Other (See Comments)    unknown  . Prevacid [Lansoprazole] Other (See Comments)    dizziness    Patient Measurements: Height: 5\' 3"  (160 cm) Weight: 130 lb 15.3 oz (59.4 kg) IBW/kg (Calculated) : 52.4 Heparin Dosing Weight:   Vital Signs: Temp: 98.1 F (36.7 C) (06/05 1547) Temp Source: Oral (06/05 1547) BP: 91/49 mmHg (06/05 1500) Pulse Rate: 74 (06/05 1600)  Labs:  Recent Labs  08/18/14 0011 08/18/14 0910  08/18/14 1415 08/18/14 1918 08/19/14 0255  08/19/14 2128 08/20/14 0409 08/20/14 0659 08/20/14 1615  HGB 11.3*  --   --   --   --  10.8*  --   --  11.2*  --   --   HCT 34.4*  --   --   --   --  33.0*  --   --  34.2*  --   --   PLT 193  --   --   --   --  202  --   --  183  --   --   HEPARINUNFRC  --   --   < >  --   --   --   < > 0.12*  --  0.53 0.37  CREATININE 1.23*  --   --   --   --  0.82  --   --   --   --   --   TROPONINI 0.43* 0.44*  --  0.31* 0.29*  --   --   --   --   --   --   < > = values in this interval not displayed.  Estimated Creatinine Clearance: 48.3 mL/min (by C-G formula based on Cr of 0.82).   Medications:  Scheduled:  . amLODipine  5 mg Oral Daily  . aspirin  324 mg Oral Once  . aspirin  81 mg Oral Daily  . DULoxetine  20 mg Oral Daily  . metoprolol succinate  12.5 mg Oral Daily  . sodium chloride  3 mL Intravenous Q12H   Infusions:  . heparin 900 Units/hr (08/20/14 0800)  . nitroGLYCERIN Stopped (08/19/14  0200)    Assessment: 79 yo female with ACS/STEMi is currently on therapeutic heparin.  Heparin level is 0.37. Goal of Therapy:  Heparin level 0.3-0.7 units/ml Monitor platelets by anticoagulation protocol: Yes   Plan:  - continue heparin @ 900 units/hr. - heparin level in am  Maevis Mumby, Tsz-Yin 08/20/2014,5:11 PM

## 2014-08-20 NOTE — Progress Notes (Addendum)
ANTICOAGULATION CONSULT NOTE - Follow Up Consult  Pharmacy Consult for heparin Indication: ACS/STEMI  Allergies  Allergen Reactions  . Ezetimibe Other (See Comments)    Muscle pain= zetia  . Ezetimibe-Simvastatin Other (See Comments)    Muscle pain= vytorin  . Morphine Other (See Comments)    nightmares  . Rosuvastatin Other (See Comments)    Muscle pain  . Ambien [Zolpidem Tartrate] Other (See Comments)    Fell down  . Plaquenil [Hydroxychloroquine Sulfate] Other (See Comments)  . Polytrim [Polymyxin B-Trimethoprim] Other (See Comments)    unknown  . Prevacid [Lansoprazole] Other (See Comments)    dizziness    Patient Measurements: Height: 5\' 3"  (160 cm) Weight: 130 lb 15.3 oz (59.4 kg) IBW/kg (Calculated) : 52.4 Heparin Dosing Weight: 58 kg  Vital Signs: Temp: 97.5 F (36.4 C) (06/05 0748) Temp Source: Oral (06/05 0748) BP: 126/47 mmHg (06/05 0748) Pulse Rate: 57 (06/05 0748)  Labs:  Recent Labs  08/18/14 0011 08/18/14 0910  08/18/14 1415 08/18/14 1918 08/19/14 0255 08/19/14 1225 08/19/14 2128 08/20/14 0409 08/20/14 0659  HGB 11.3*  --   --   --   --  10.8*  --   --  11.2*  --   HCT 34.4*  --   --   --   --  33.0*  --   --  34.2*  --   PLT 193  --   --   --   --  202  --   --  183  --   HEPARINUNFRC  --   --   < >  --   --   --  0.32 0.12*  --  0.53  CREATININE 1.23*  --   --   --   --  0.82  --   --   --   --   TROPONINI 0.43* 0.44*  --  0.31* 0.29*  --   --   --   --   --   < > = values in this interval not displayed.  Estimated Creatinine Clearance: 48.3 mL/min (by C-G formula based on Cr of 0.82).   Medications:  Heparin infusion at 900 units/hr  Assessment: 79 yo f s/p cardiac cath. Pharmacy is consulted to dose heparin.  HL this AM is therapeutic at 0.53 after increasing infusion last night. Plan to go back to cath on Monday for relook. No issues noted. CBC stable.  Goal of Therapy:  Heparin level 0.3-0.7 units/ml Monitor platelets by  anticoagulation protocol: Yes   Plan:  Continue heparin at 900 units/hr 8-hr HL to confirm at 1600 Daily HL, CBC Monitor hgb/plts, s/s of bleeding, clinical course  Saphia Vanderford L. Roseanne Reno, PharmD Clinical Pharmacy Resident Pager: [phone] 08/20/2014 9:00 AM

## 2014-08-21 ENCOUNTER — Inpatient Hospital Stay: Admission: RE | Admit: 2014-08-21 | Payer: Medicare Other | Source: Ambulatory Visit

## 2014-08-21 ENCOUNTER — Encounter (HOSPITAL_COMMUNITY): Payer: Self-pay | Admitting: Cardiology

## 2014-08-21 ENCOUNTER — Encounter (HOSPITAL_COMMUNITY)
Admission: EM | Disposition: A | Discharge status: Home / Self Care | Payer: Medicare Other | Source: Home / Self Care | Attending: Cardiology

## 2014-08-21 HISTORY — PX: CARDIAC CATHETERIZATION: SHX172

## 2014-08-21 LAB — HEPARIN LEVEL (UNFRACTIONATED): Heparin Unfractionated: 0.46 IU/mL (ref 0.30–0.70)

## 2014-08-21 LAB — PROTIME-INR
INR: 1.02 (ref 0.00–1.49)
Prothrombin Time: 13.6 seconds (ref 11.6–15.2)

## 2014-08-21 LAB — CBC
HCT: 32.9 % — ABNORMAL LOW (ref 36.0–46.0)
Hemoglobin: 10.8 g/dL — ABNORMAL LOW (ref 12.0–15.0)
MCH: 27.1 pg (ref 26.0–34.0)
MCHC: 32.8 g/dL (ref 30.0–36.0)
MCV: 82.7 fL (ref 78.0–100.0)
Platelets: 166 10*3/uL (ref 150–400)
RBC: 3.98 MIL/uL (ref 3.87–5.11)
RDW: 14.5 % (ref 11.5–15.5)
WBC: 4.7 10*3/uL (ref 4.0–10.5)

## 2014-08-21 SURGERY — LEFT HEART CATH AND CORONARY ANGIOGRAPHY
Anesthesia: LOCAL

## 2014-08-21 MED ORDER — SODIUM CHLORIDE 0.9 % IV SOLN
INTRAVENOUS | Status: DC | PRN
  Administered 2014-08-21: 250 mL via INTRAVENOUS

## 2014-08-21 MED ORDER — SODIUM CHLORIDE 0.9 % WEIGHT BASED INFUSION
3.0000 mL/kg/h | INTRAVENOUS | Status: DC
  Administered 2014-08-21: 3 mL/kg/h via INTRAVENOUS

## 2014-08-21 MED ORDER — SODIUM CHLORIDE 0.9 % IJ SOLN: 3.0000 mL | INTRAMUSCULAR | Status: DC | PRN

## 2014-08-21 MED ORDER — SODIUM CHLORIDE 0.9 % WEIGHT BASED INFUSION
1.0000 mL/kg/h | INTRAVENOUS | Status: DC
  Administered 2014-08-21: 1 mL/kg/h via INTRAVENOUS

## 2014-08-21 MED ORDER — METOPROLOL SUCCINATE ER 25 MG PO TB24: 12.5000 mg | ORAL_TABLET | Freq: Every day | ORAL | Status: DC

## 2014-08-21 MED ORDER — NITROGLYCERIN 1 MG/10 ML FOR IR/CATH LAB
INTRA_ARTERIAL | Status: DC | PRN
  Administered 2014-08-21: 200 ug via INTRACORONARY

## 2014-08-21 MED ORDER — FENTANYL CITRATE (PF) 100 MCG/2ML IJ SOLN
INTRAMUSCULAR | Status: AC
  Filled 2014-08-21: qty 2

## 2014-08-21 MED ORDER — SODIUM CHLORIDE 0.9 % WEIGHT BASED INFUSION
1.0000 mL/kg/h | INTRAVENOUS | Status: AC
  Administered 2014-08-21: 15:00:00 1 mL/kg/h via INTRAVENOUS

## 2014-08-21 MED ORDER — MIDAZOLAM HCL 2 MG/2ML IJ SOLN
INTRAMUSCULAR | Status: DC | PRN
Start: 2014-08-21 — End: 2014-08-21
  Administered 2014-08-21: 1 mg via INTRAVENOUS

## 2014-08-21 MED ORDER — CLOPIDOGREL BISULFATE 75 MG PO TABS: 75.0000 mg | ORAL_TABLET | Freq: Every day | ORAL | Status: DC

## 2014-08-21 MED ORDER — IOHEXOL 300 MG/ML  SOLN
INTRAMUSCULAR | Status: DC | PRN
  Administered 2014-08-21: 50 mL via INTRAVENOUS

## 2014-08-21 MED ORDER — HEART ATTACK BOUNCING BOOK
Freq: Once | Status: AC
  Administered 2014-08-21: 21:00:00
  Filled 2014-08-21: qty 1

## 2014-08-21 MED ORDER — LIDOCAINE HCL (PF) 1 % IJ SOLN
INTRAMUSCULAR | Status: AC
  Filled 2014-08-21: qty 30

## 2014-08-21 MED ORDER — SODIUM CHLORIDE 0.9 % IV SOLN: 250.0000 mL | INTRAVENOUS | Status: DC | PRN

## 2014-08-21 MED ORDER — SODIUM CHLORIDE 0.9 % IJ SOLN
3.0000 mL | Freq: Two times a day (BID) | INTRAMUSCULAR | Status: DC
  Administered 2014-08-22: 11:00:00 3 mL via INTRAVENOUS

## 2014-08-21 MED ORDER — HEPARIN (PORCINE) IN NACL 2-0.9 UNIT/ML-% IJ SOLN
INTRAMUSCULAR | Status: AC
  Filled 2014-08-21: qty 1000

## 2014-08-21 MED ORDER — SODIUM CHLORIDE 0.9 % IJ SOLN: 3.0000 mL | Freq: Two times a day (BID) | INTRAMUSCULAR | Status: DC

## 2014-08-21 MED ORDER — FENTANYL CITRATE (PF) 100 MCG/2ML IJ SOLN
INTRAMUSCULAR | Status: DC | PRN
  Administered 2014-08-21: 25 ug via INTRAVENOUS

## 2014-08-21 MED ORDER — SODIUM CHLORIDE 0.9 % IJ SOLN
3.0000 mL | INTRAMUSCULAR | Status: DC | PRN
Start: 2014-08-21 — End: 2014-08-22

## 2014-08-21 MED ORDER — CLOPIDOGREL BISULFATE 75 MG PO TABS
75.0000 mg | ORAL_TABLET | Freq: Every day | ORAL | Status: DC
  Administered 2014-08-22: 08:00:00 75 mg via ORAL
  Filled 2014-08-21: qty 1

## 2014-08-21 MED ORDER — NITROGLYCERIN 1 MG/10 ML FOR IR/CATH LAB
INTRA_ARTERIAL | Status: AC
  Filled 2014-08-21: qty 10

## 2014-08-21 MED ORDER — NITROGLYCERIN 0.4 MG SL SUBL: 0.4000 mg | SUBLINGUAL_TABLET | SUBLINGUAL | Status: DC | PRN

## 2014-08-21 MED ORDER — MIDAZOLAM HCL 2 MG/2ML IJ SOLN
INTRAMUSCULAR | Status: AC
  Filled 2014-08-21: qty 2

## 2014-08-21 SURGICAL SUPPLY — 7 items
CATH VISTA GUIDE 6FR JR4 (CATHETERS) ×1 IMPLANT
DEVICE WIRE ANGIOSEAL 6FR (Vascular Products) ×1 IMPLANT
KIT HEART LEFT (KITS) ×2 IMPLANT
PACK CARDIAC CATHETERIZATION (CUSTOM PROCEDURE TRAY) ×2 IMPLANT
SHEATH PINNACLE 6F 10CM (SHEATH) ×1 IMPLANT
TRANSDUCER W/STOPCOCK (MISCELLANEOUS) ×2 IMPLANT
WIRE EMERALD 3MM-J .035X150CM (WIRE) IMPLANT

## 2014-08-21 NOTE — Discharge Instructions (Signed)
Radial Site Care °Refer to this sheet in the next few weeks. These instructions provide you with information on caring for yourself after your procedure. Your caregiver may also give you more specific instructions. Your treatment has been planned according to current medical practices, but problems sometimes occur. Call your caregiver if you have any problems or questions after your procedure. °HOME CARE INSTRUCTIONS °· You may shower the day after the procedure. Remove the bandage (dressing) and gently wash the site with plain soap and water. Gently pat the site dry. °· Do not apply powder or lotion to the site. °· Do not submerge the affected site in water for 3 to 5 days. °· Inspect the site at least twice daily. °· Do not flex or bend the affected arm for 24 hours. °· No lifting over 5 pounds (2.3 kg) for 5 days after your procedure. °· Do not drive home if you are discharged the same day of the procedure. Have someone else drive you. °· You may drive 24 hours after the procedure unless otherwise instructed by your caregiver. °· Do not operate machinery or power tools for 24 hours. °· A responsible adult should be with you for the first 24 hours after you arrive home. °What to expect: °· Any bruising will usually fade within 1 to 2 weeks. °· Blood that collects in the tissue (hematoma) may be painful to the touch. It should usually decrease in size and tenderness within 1 to 2 weeks. °SEEK IMMEDIATE MEDICAL CARE IF: °· You have unusual pain at the radial site. °· You have redness, warmth, swelling, or pain at the radial site. °· You have drainage (other than a small amount of blood on the dressing). °· You have chills. °· You have a fever or persistent symptoms for more than 72 hours. °· You have a fever and your symptoms suddenly get worse. °· Your arm becomes pale, cool, tingly, or numb. °· You have heavy bleeding from the site. Hold pressure on the site. °Document Released: 04/05/2010 Document Revised:  05/26/2011 Document Reviewed: 04/05/2010 °Exit[employer]® Patient Information ©2015 [employer]. This information is not intended to replace advice given to you by your health care provider. Make sure you discuss any questions you have with your health care provider. °Coronary Angiogram With Stent, Care After °Refer to this sheet in the next few weeks. These instructions provide you with information on caring for yourself after your procedure. Your health care provider may also give you more specific instructions. Your treatment has been planned according to current medical practices, but problems sometimes occur. Call your health care provider if you have any problems or questions after your procedure.  °WHAT TO EXPECT AFTER THE PROCEDURE  °The insertion site may be tender for a few days after your procedure. °HOME CARE INSTRUCTIONS  °· Take medicines only as directed by your health care provider. Blood thinners may be prescribed after your procedure to improve blood flow through the stent. °· Change any bandages (dressings) as directed by your health care provider.   °· Check your insertion site every day for redness, swelling, or fluid leaking from the insertion.   °· Do not take baths, swim, or use a hot tub until your health care provider approves. You may shower. Pat the insertion area dry. Do not rub the insertion area with a washcloth or towel.   °· Eat a heart-healthy diet. This should include plenty of fresh fruits and vegetables. Meat should be lean cuts. Avoid the following types of food:   °·   Food that is high in salt.   °· Canned or highly processed food.   °· Food that is high in saturated fat or sugar.   °· Fried food.   °· Make any other lifestyle changes recommended by your health care provider. This may include:   °· Not using any tobacco products including cigarettes, chewing tobacco, or electronic cigarettes.  °· Managing your weight.   °· Getting regular exercise.   °· Managing your blood pressure.    °· Limiting your alcohol intake.   °· Managing other health problems, such as diabetes.   °· If you need an MRI after your heart stent was placed, be sure to tell the health care provider who orders the MRI that you have a heart stent.   °· Keep all follow-up visits as directed by your health care provider.   °SEEK IMMEDIATE MEDICAL CARE IF:  °· You develop chest pain, shortness of breath, feel faint, or pass out. °· You have bleeding, swelling larger than a walnut, or drainage from the catheter insertion site. °· You develop pain, discoloration, coldness, or severe bruising in the leg or arm that held the catheter. °· You develop bleeding from any other place such as from the bowels. There may be bright red blood in the urine or stools, or it may appear as black, tarry stools. °· You have a fever or chills. °MAKE SURE YOU: °· Understand these instructions. °· Will watch your condition. °· Will get help right away if you are not doing well or get worse. °Document Released: 09/20/2004 Document Revised: 07/18/2013 Document Reviewed: 08/04/2012 °ExitCare® Patient Information ©2015 [employer]. This information is not intended to replace advice given to you by your health care provider. Make sure you discuss any questions you have with your health care provider. ° °

## 2014-08-21 NOTE — H&P (View-Only) (Signed)
Cards: Dr. Cooper  Subjective:  No chest pain, radial site normal. Left arm pain was anginal symptom. Wanting to know when she is going back to cath lab.   Objective:  Vital Signs in the last 24 hours: Temp:  [97.5 F (36.4 C)-98.2 F (36.8 C)] 97.5 F (36.4 C) (06/05 0748) Pulse Rate:  [42-77] 57 (06/05 0748) Resp:  [10-21] 18 (06/05 0748) BP: (83-148)/(29-76) 126/47 mmHg (06/05 0748) SpO2:  [89 %-100 %] 100 % (06/05 0748)  Intake/Output from previous day: 06/04 0701 - 06/05 0700 In: 535.9 [P.O.:240; I.V.:295.9] Out: 1300 [Urine:1300]   Physical Exam: General: Well developed, well nourished, in no acute distress. Head:  Normocephalic and atraumatic. Lungs: Clear to auscultation and percussion. Heart: Normal S1 and S2.  No murmur, rubs or gallops.  Abdomen: soft, non-tender, positive bowel sounds. Extremities: No clubbing or cyanosis. No edema. Radial site normal Neurologic: Alert and oriented x 3.    Lab Results:  Recent Labs  08/19/14 0255 08/20/14 0409  WBC 5.5 5.2  HGB 10.8* 11.2*  PLT 202 183    Recent Labs  08/18/14 0011 08/19/14 0255  NA 134* 135  K 4.6 4.1  CL 101 106  CO2 26 19*  GLUCOSE 98 91  BUN 22* 14  CREATININE 1.23* 0.82    Recent Labs  08/18/14 1415 08/18/14 1918  TROPONINI 0.31* 0.29*   Hepatic Functi  Recent Labs  08/19/14 0255  CHOL 162      Telemetry: No adverse rhythms Personally viewed.   EKG:  6/4: TWI inferior leads (ischemia), prior 6/3 at 2014 minimal ST elevation inf leads, prior 6/2 - normal ecg.   Cardiac Studies:  Cath report (Dr. Harding) 1. Severe focal stenosis of the proximal RPDA with moderate to Severe disease in the proximal LAD. 2. RPDA lesion, 85% stenosed. After attempted PTCA with wire related dissection, there is a 99% residual stenosis post intervention attempt but with TIMI 2 flow and no anginal pain. 3. Unsuccessful Attempted PTCA of rPDA with resulting Dissection but with restored flow  after wire removal - TIMI 2 flow. 4. Prox LAD lesion, ~60% stenosed (somewhat hazy shelflike lesion at the takeoff of a First Diagonal with a 60% ostial stenosis and major septal perforator trunk that has a 90% ostial stenosis) 5. Large-caliber ramus intermedius that courses as a large OM branch with moderate diffuse disease. 6. Preserved/low normal EF roughly 50-55% with apical hypo-kinesis/akinesis. 7. Normal LVEDP  Despite presence of the extensive dissection in the PDA with only TIMI 2 flow, the patient remained angina (left arm pain) free and hemodynamically stable throughout the entire procedure despite dissection and poor flow down the PDA.  Recommendations (after discussing with Dr. Tom Kelly who was present to review the films, and Dr. Michael Cooper via telephone):  Transfer to CCU for closer monitoring. Standard radial cath TR band removal. She will stay bedrest.  Initiate Aggrastat infusion, restart heparin infusion 6 hours after TR band removal. Will not continue Brilinta until we are clear if PCI will be performed  Plan will be to perform a relook angiography on Monday morning if she remains stable over the weekend. At that time we will determine the level of flow in the PDA and also consider evaluating the LAD lesion with FFR versus IVUS. Potential course of action following this evaluation would be reattempted PCI on the PDA, medical management only versus considering possible 2 vessel CABG.  IV nitroglycerin infusion since the vessel flow "perked up "after intracoronary nitroglycerin   injection  Spent roughly 15 minutes discussing the results with the patient and her husband. Then called and talked to her son (Dr. Tom Duchesneau) and discussed again with him the initial findings the attempted PTCA with results and potential future plans.  ECHO: 08/18/14 - Left ventricle: There is mild/moderate hypokinesis of the mid/apical inferior segments. The cavity size was normal.  Wall thickness was normal. The estimated ejection fraction was 55%. - Aortic valve: Sclerosis without stenosis. There was mild regurgitation. - Right ventricle: The cavity size was normal. Systolic function was normal. - Pulmonary arteries: PA peak pressure: 43 mm Hg (S).  Scheduled Meds: . amLODipine  5 mg Oral Daily  . aspirin  324 mg Oral Once  . aspirin  81 mg Oral Daily  . DULoxetine  20 mg Oral Daily  . metoprolol succinate  12.5 mg Oral Daily  . sodium chloride  3 mL Intravenous Q12H   Continuous Infusions: . heparin 900 Units/hr (08/20/14 0150)  . nitroGLYCERIN Stopped (08/19/14 0200)   PRN Meds:.sodium chloride, acetaminophen, nitroGLYCERIN, ondansetron (ZOFRAN) IV, sodium chloride, temazepam  Assessment/Plan:  Active Problems:   NSTEMI (non-ST elevated myocardial infarction)  NSTEMI  - TWI inferior leads this AM. Yesterday evening post cath subtle ST elevation inferior leads  - No anginal symptoms.   - IV aggrastat for 24 complete this evening (no signs of bleeding)  - IV heparin  - statin intolerance previously. Not on currently. LDL 88  - Bb low dose  - Trop 0.31  Plan is to perform cath on Monday.  (MRI by Dr. Willis on hold - planned for Monday)  CAD  - as above  - dissection PDA with TIMI 2 flow  - LAD Ca ++ lesion (plan FFR)  Statin intolerance  - as above  - LDL 88    [name] 08/20/2014, 9:24 AM     

## 2014-08-21 NOTE — Discharge Summary (Signed)
Patient ID: [name],  MRN: [id number], DOB/AGE: Feb 27, 1936 79 y.o.  Admit date: 08/17/2014 Discharge date: 08/22/2014  Primary Care Provider: Cala Bradford, MD Primary Cardiologist: Dr Excell Seltzer  Discharge Diagnoses Principal Problem:   NSTEMI (non-ST elevated myocardial infarction) Active Problems:   CAD S/P unsuccessful PCI 08/18/14   Dyslipidemia-statin and Zetia intol    Procedures:  Cath, attempted PCI 08/18/14                         Re look cath 08/21/14   Hospital Course:  79 yo woman who follows with Dr. Excell Seltzer who presented to [facility] 08/17/14 with cc of left arm pain. Work up at [facility] significant for troponin 0.43 which prompted call to cardiology for transfer and admission. She underwent cath and attempted PDA PCI on 08/18/14. This was complicated by dissection and the procedure was aborted. She was watched over the weekend in CCU on anticoagulation and ant plt therapy. Re look cath 08/21/14 showed healed dissection in the PDA. She did have an inferior WMA and was symptom free. The plan is for continued medical Rx. Echo showed an EF of 55%. She did have recurrent arm pain on 08/22/14 early in the am (awakened her from sleep). EKG showed no acute changes. We added low dose Imdur and ambulated her 08/22/14. Her B/P was soft and we stopped her Amlodipine. She'll f/u in the office as a TOC pt in 7 days.  Discharge Vitals:  Blood pressure 83/41, pulse 77, temperature 98.3 F (36.8 C), temperature source Oral, resp. rate 14, height 5\' 3"  (1.6 m), weight 127 lb 13.9 oz (58 kg), SpO2 98 %.    Labs: Results for orders placed or performed during the hospital encounter of 08/17/14 (from the past 24 hour(s))  CBC     Status: Abnormal   Collection Time: 08/22/14  4:35 AM  Result Value Ref Range   WBC 4.1 4.0 - 10.5 K/uL   RBC 3.83 (L) 3.87 - 5.11 MIL/uL   Hemoglobin 10.4 (L) 12.0 - 15.0 g/dL   HCT 94.5 (L) 03.8 - 88.2 %   MCV 83.6 78.0 - 100.0 fL   MCH 27.2 26.0 - 34.0 pg   MCHC 32.5 30.0 -  36.0 g/dL   RDW 80.0 34.9 - 17.9 %   Platelets 166 150 - 400 K/uL  Basic metabolic panel     Status: Abnormal   Collection Time: 08/22/14  4:35 AM  Result Value Ref Range   Sodium 138 135 - 145 mmol/L   Potassium 4.1 3.5 - 5.1 mmol/L   Chloride 108 101 - 111 mmol/L   CO2 21 (L) 22 - 32 mmol/L   Glucose, Bld 93 65 - 99 mg/dL   BUN 13 6 - 20 mg/dL   Creatinine, Ser 1.50 0.44 - 1.00 mg/dL   Calcium 9.2 8.9 - 56.9 mg/dL   GFR calc non Af Amer 59 (L) >60 mL/min   GFR calc Af Amer >60 >60 mL/min   Anion gap 9 5 - 15  Troponin I     Status: Abnormal   Collection Time: 08/22/14  8:00 AM  Result Value Ref Range   Troponin I 1.16 (HH) <0.031 ng/mL    Disposition:      Follow-up Information    Follow up with Tonny Bollman, MD.   Specialty:  Cardiology   Why:  office will call    Contact information:   [address] [place] [address] [phone]       Discharge Medications:    Medication List    STOP taking these medications        amLODipine 5 MG tablet  Commonly known as:  NORVASC     Evening Primrose Oil 500 MG Caps     Ginger Root 250 MG Caps     GRAPESEED EXTRACT PO     ibuprofen 200 MG tablet  Commonly known as:  ADVIL,MOTRIN      TAKE these medications        acetaminophen 500 MG tablet  Commonly known as:  TYLENOL  Take 1,000 mg by mouth every 6 (six) hours as needed (pain).     aspirin 81 MG tablet  Take 81 mg by mouth daily.     Biotin 5000 MCG Caps  Take 1 capsule by mouth daily.     clopidogrel 75 MG tablet  Commonly known as:  PLAVIX  Take 1 tablet (75 mg total) by mouth daily with breakfast.     COQ-10 PO  Take 1 tablet by mouth daily as needed (for vitamin).     cyclobenzaprine 10 MG tablet  Commonly known as:  FLEXERIL  Take 10 mg by mouth daily as needed for muscle spasms.     ferrous sulfate 325 (65 FE) MG tablet  Take 325 mg by mouth daily with breakfast.     fluticasone 50 MCG/ACT nasal spray  Commonly  known as:  FLONASE  Place 2 sprays into both nostrils daily.     isosorbide mononitrate 30 MG 24 hr tablet  Commonly known as:  IMDUR  Take 0.5 tablets (15 mg total) by mouth daily.     metoprolol succinate 25 MG 24 hr tablet  Commonly known as:  TOPROL-XL  Take 0.5 tablets (12.5 mg total) by mouth daily.     nitroGLYCERIN 0.4 MG SL tablet  Commonly known as:  NITROSTAT  Place 1 tablet (0.4 mg total) under the tongue every 5 (five) minutes as needed for chest pain.     REFRESH EYE ITCH RELIEF 0.025 % ophthalmic solution  Generic drug:  ketotifen  Place 2 drops into both eyes 2 (two) times daily as needed (for dry eyes).     temazepam 15 MG capsule  Commonly known as:  RESTORIL  Take 15 mg by mouth at bedtime as needed for sleep. SLEEP AID     Vitamin D3 2000 UNITS Tabs  Take 1 capsule by mouth every other day.         Duration of Discharge Encounter: Greater than 30 minutes including physician time.  Jolene Provost PA-C 08/22/2014 1:51 PM

## 2014-08-21 NOTE — Clinical Documentation Improvement (Signed)
  H&P and Progress notes indicate Chronic Kidney Disease. 08/18/14:  BUN= 22; Crea= 1.23; GFR= 42 08/19/14:  BUN= 14; Crea= .82; GFR= > 60  1. Please clarify the stage of the chronic kidney disease:    Stage 1    Stage 2 (mild)   Stage 3 (moderate)   Stage 4 (severe)   Stage 5     Other (please specify) ____________________    Unable to determine    Thank you,  Sharyn Creamer, BSN, RN [place] HIM/Clinical Documentation Specialist [email or web address] [phone]

## 2014-08-21 NOTE — Care Management Note (Signed)
Case Management Note  Patient Details  Name: [name] MRN: [id number] Date of Birth: 15-Apr-1935  Subjective/Objective:      Adm w nstemi              Action/Plan: lives w husband, pcp dr Aram Beecham white   Expected Discharge Date:  08/22/14               Expected Discharge Plan:  Home/Self Care  In-House Referral:     Discharge planning Services     Post Acute Care Choice:    Choice offered to:     DME Arranged:    DME Agency:     HH Arranged:    HH Agency:     Status of Service:     Medicare Important Message Given:    Date Medicare IM Given:    Medicare IM give by:    Date Additional Medicare IM Given:    Additional Medicare Important Message give by:     If discussed at Long Length of Stay Meetings, dates discussed:    Additional Comments: ur ins review  Hanley Hays, RN 08/21/2014, 9:50 AM

## 2014-08-21 NOTE — Interval H&P Note (Signed)
History and Physical Interval Note:  08/21/2014 11:58 AM  [name]  has presented today for surgery, with the diagnosis of CAD- recent Dissection.  The various methods of treatment have been discussed with the patient and family. After consideration of risks, benefits and other options for treatment, the patient has consented to  Procedure(s): Left Heart Cath and Coronary Angiography (N/A) +/- PCI as a surgical intervention.    The patient's history has been reviewed, patient examined, no change in status, stable for surgery.  I have reviewed the patient's chart and labs.  Questions were answered to the patient's satisfaction.     [name]   Cath Lab Visit (complete for each Cath Lab visit)  Clinical Evaluation Leading to the Procedure:   ACS: Yes.    Non-ACS:    Anginal Classification: CCS III  Anti-ischemic medical therapy: Minimal Therapy (1 class of medications)  Non-Invasive Test Results: No non-invasive testing performed  Prior CABG: No previous CABG

## 2014-08-21 NOTE — Progress Notes (Signed)
Originally planned for discharge after relook cath showed stable coronary dissection. However pt too weak to walk tonight and does not feel safe going home. Will keep overnight. Transfer to telemetry bed.   Ramond Dial PA Pager: [phone]

## 2014-08-21 NOTE — Progress Notes (Signed)
ANTICOAGULATION CONSULT NOTE - Follow Up Consult  Pharmacy Consult for Heparin Indication: chest pain/ACS  Allergies  Allergen Reactions  . Ezetimibe Other (See Comments)    Muscle pain= zetia  . Ezetimibe-Simvastatin Other (See Comments)    Muscle pain= vytorin  . Morphine Other (See Comments)    nightmares  . Rosuvastatin Other (See Comments)    Muscle pain  . Ambien [Zolpidem Tartrate] Other (See Comments)    Fell down  . Plaquenil [Hydroxychloroquine Sulfate] Other (See Comments)  . Polytrim [Polymyxin B-Trimethoprim] Other (See Comments)    unknown  . Prevacid [Lansoprazole] Other (See Comments)    dizziness    Patient Measurements: Height: 5\' 3"  (160 cm) Weight: 130 lb 15.3 oz (59.4 kg) IBW/kg (Calculated) : 52.4  Vital Signs: Temp: 97.9 F (36.6 C) (06/06 0759) Temp Source: Axillary (06/06 0759) BP: 113/62 mmHg (06/06 0900) Pulse Rate: 64 (06/06 0900)  Labs:  Recent Labs  08/18/14 1415 08/18/14 1918  08/19/14 0255  08/20/14 0409 08/20/14 0659 08/20/14 1615 08/21/14 0241  HGB  --   --   < > 10.8*  --  11.2*  --   --  10.8*  HCT  --   --   --  33.0*  --  34.2*  --   --  32.9*  PLT  --   --   --  202  --  183  --   --  166  HEPARINUNFRC  --   --   --   --   < >  --  0.53 0.37 0.46  CREATININE  --   --   --  0.82  --   --   --   --   --   TROPONINI 0.31* 0.29*  --   --   --   --   --   --   --   < > = values in this interval not displayed.  Estimated Creatinine Clearance: 48.3 mL/min (by C-G formula based on Cr of 0.82).   Medications:  Heparin @ 900 units/hr  Assessment: 78yof admitted with NSTEMI s/p cath on 6/3 found to have severe stenosis of the proximal rPDA. PTCA was attempted but unsuccessful. Heparin was resumed post-cath (along with aggrastat x 24 hours) with plan for relook cath today. Heparin level is therapeutic. CBC is stable. No bleeding reported.  Goal of Therapy:  Heparin level 0.3-0.7 units/ml Monitor platelets by anticoagulation  protocol: Yes   Plan:  1) Continue heparin at 900 units/hr 2) Follow up after relook cath  Fredrik Rigger 08/21/2014,9:25 AM

## 2014-08-22 ENCOUNTER — Encounter (HOSPITAL_COMMUNITY): Payer: Self-pay | Admitting: General Practice

## 2014-08-22 LAB — BASIC METABOLIC PANEL
Anion gap: 9 (ref 5–15)
BUN: 13 mg/dL (ref 6–20)
CALCIUM: 9.2 mg/dL (ref 8.9–10.3)
CHLORIDE: 108 mmol/L (ref 101–111)
CO2: 21 mmol/L — ABNORMAL LOW (ref 22–32)
CREATININE: 0.92 mg/dL (ref 0.44–1.00)
GFR calc non Af Amer: 59 mL/min — ABNORMAL LOW (ref >60)
Glucose, Bld: 93 mg/dL (ref 65–99)
Potassium: 4.1 mmol/L (ref 3.5–5.1)
SODIUM: 138 mmol/L (ref 135–145)

## 2014-08-22 LAB — CBC
HCT: 32 % — ABNORMAL LOW (ref 36.0–46.0)
HEMOGLOBIN: 10.4 g/dL — AB (ref 12.0–15.0)
MCH: 27.2 pg (ref 26.0–34.0)
MCHC: 32.5 g/dL (ref 30.0–36.0)
MCV: 83.6 fL (ref 78.0–100.0)
Platelets: 166 10*3/uL (ref 150–400)
RBC: 3.83 MIL/uL — AB (ref 3.87–5.11)
RDW: 14.8 % (ref 11.5–15.5)
WBC: 4.1 10*3/uL (ref 4.0–10.5)

## 2014-08-22 LAB — TROPONIN I: Troponin I: 1.16 ng/mL (ref <0.031)

## 2014-08-22 LAB — POCT ACTIVATED CLOTTING TIME: Activated Clotting Time: 140 seconds

## 2014-08-22 MED ORDER — ISOSORBIDE MONONITRATE ER 30 MG PO TB24: 15.0000 mg | ORAL_TABLET | Freq: Every day | ORAL | Status: DC

## 2014-08-22 MED ORDER — TRAMADOL HCL 50 MG PO TABS: 25.0000 mg | ORAL_TABLET | ORAL | Status: DC | PRN

## 2014-08-22 MED ORDER — ISOSORBIDE MONONITRATE ER 30 MG PO TB24
15.0000 mg | ORAL_TABLET | Freq: Every day | ORAL | Status: DC
  Administered 2014-08-22: 08:00:00 15 mg via ORAL
  Filled 2014-08-22: qty 1

## 2014-08-22 MED FILL — Heparin Sodium (Porcine) 2 Unit/ML in Sodium Chloride 0.9%: INTRAMUSCULAR | Qty: 1000 | Status: AC

## 2014-08-22 NOTE — Progress Notes (Signed)
Pt c/o not feeling good & left arm pain. No chest pain. V/S stable. EKG done- no changes. Dr. Tresa Endo informed. Tylenol 650 mg & Restoril 15 mg given. Will continue to monitor pt.

## 2014-08-22 NOTE — Progress Notes (Signed)
CARDIAC REHAB PHASE I   PRE:  Rate/Rhythm: 70 SR  BP:  Sitting: 96/46        SaO2: 97 RA  MODE:  Ambulation: 360 ft   POST:  Rate/Rhythm: 100 SR  BP:  Sitting: 100/62         SaO2: 98 RA  Pt ambulated 360 ft on RA, independent, slow, steady gait, tolerated well.  Pt denies cp, dizziness, DOE, declined rest stop. Pt did c/o ongoing fatigue. Completed MI education. Reviewed anti-platelet therapy, risk factors, activity restrictions, ntg, exercise, heart healthy diet, sodium restrictions, phase 2 cardiac rehab. Pt verbalized understanding. Pt agrees to phase 2 cardiac rehab. Will send referral to [facility].  Pt seems anxious, taking notes during education, asking many questions, states she has been under a lot of stress as her daughter has terminal cancer. Pt states she is concerned about her ongoing fatigue and going home. Will follow up tomorrow if pt not discharged today.   [phone]   Joylene Grapes, RN, BSN 08/22/2014 10:05 AM

## 2014-08-22 NOTE — Progress Notes (Signed)
    Subjective:  Pt was to be discharged last night but felt too weak. Around 2 am she was awakened from a "deep sleep" with Lt arm pain "ache" same as prior to admission. EKG showed no acute changes. She was given Tylenol and Restoril. This am she is pain free.   Objective:  Vital Signs in the last 24 hours: Temp:  [97.5 F (36.4 C)-98.3 F (36.8 C)] 98 F (36.7 C) (06/07 0302) Pulse Rate:  [0-90] 77 (06/07 0302) Resp:  [0-17] 16 (06/07 0302) BP: (83-138)/(33-64) 136/63 mmHg (06/07 0302) SpO2:  [0 %-100 %] 93 % (06/07 0032) Weight:  [127 lb 13.9 oz (58 kg)] 127 lb 13.9 oz (58 kg) (06/07 0032)  Intake/Output from previous day:  Intake/Output Summary (Last 24 hours) at 08/22/14 0746 Last data filed at 08/22/14 0224  Gross per 24 hour  Intake 915.41 ml  Output   1400 ml  Net -484.59 ml    Physical Exam: General appearance: alert, cooperative, no distress and pale Neck: no JVD Lungs: clear to auscultation bilaterally Heart: regular rate and rhythm   Rate: 76  Rhythm: normal sinus rhythm  Lab Results:  Recent Labs  08/21/14 0241 08/22/14 0435  WBC 4.7 4.1  HGB 10.8* 10.4*  PLT 166 166    Recent Labs  08/22/14 0435  NA 138  K 4.1  CL 108  CO2 21*  GLUCOSE 93  BUN 13  CREATININE 0.92   No results for input(s): TROPONINI in the last 72 hours.  Invalid input(s): CK, MB  Recent Labs  08/21/14 1123  INR 1.02    Scheduled Meds: . amLODipine  5 mg Oral Daily  . aspirin  324 mg Oral Once  . aspirin  81 mg Oral Daily  . clopidogrel  75 mg Oral Q breakfast  . DULoxetine  20 mg Oral Daily  . metoprolol succinate  12.5 mg Oral Daily  . sodium chloride  3 mL Intravenous Q12H   Continuous Infusions:  PRN Meds:.sodium chloride, sodium chloride, acetaminophen, nitroGLYCERIN, ondansetron (ZOFRAN) IV, sodium chloride, temazepam, traMADol   Imaging: Imaging results have been reviewed  Assessment/Plan:  79 year old woman with a recent non-STEMI who  underwent cardiac catheterization on Friday, 08/18/2014. She is noted to have a focal calcified lesion in the proximal RPDA. Unfortunately with attempted PTCA/PCI, the patient had a wire related dissection resulting in only TIMI 1 to TIMI 2 flow distally. With flow restored after wire removal, the decision was made to abort attempted PCI with plans to run Aggrastat for 24 hours with IV heparin over weekend. She was monitored in the CCU over the weekend and did not have any recurrence of her anginal pain. Re look cath 08/21/14 showed stable healed dissection in the RPDA with TIMI 3 flow distally. The plan was for medical Rx unless she had more symptoms.    Principal Problem:   NSTEMI (non-ST elevated myocardial infarction) Active Problems:   CAD S/P unsuccessful PCI 08/18/14   Dyslipidemia-statin and Zetia intol   PLAN: The arm pain she had early this am sounds like her presenting symptoms. Will make sure her Troponin is not rising.  Add low dose nitrate and ambulate this am.   [name] Shelter PA-C 08/22/2014, 7:46 AM [phone]  Patient examined no arm pain ambulate if no further pain d/c home latter today.  Exam benign consider stopping Amlodipine if BP stays soft.    Charlton Haws

## 2014-08-22 NOTE — Progress Notes (Signed)
Troponin this am is 1.1   Prior to two caths it was 40-59% LICA stenosis.  F/U duplex in one year Not sure this is helpful as I would expect it to be higher after wire disection And two cath procedures complicated by disection  Charlton Haws

## 2014-08-23 MED FILL — Heparin Sodium (Porcine) 2 Unit/ML in Sodium Chloride 0.9%: INTRAMUSCULAR | Qty: 1000 | Status: AC

## 2014-08-23 MED FILL — Bivalirudin For IV Soln 250 MG: INTRAVENOUS | Qty: 250 | Status: AC

## 2014-08-23 MED FILL — Lidocaine HCl Local Preservative Free (PF) Inj 1%: INTRAMUSCULAR | Qty: 30 | Status: AC

## 2014-08-23 MED FILL — Sodium Chloride IV Soln 0.9%: INTRAVENOUS | Qty: 50 | Status: AC

## 2014-08-24 ENCOUNTER — Telehealth: Payer: Self-pay | Admitting: Cardiovascular Disease

## 2014-08-24 NOTE — Telephone Encounter (Signed)
Unable to leave message

## 2014-08-24 NOTE — Telephone Encounter (Signed)
7 day TOC fu appt with Ward Givens 08-30-14

## 2014-08-24 NOTE — Telephone Encounter (Signed)
Patient contacted regarding discharge from [facility] 6/7  Patient understands to follow up with provider ? Pt had appt on 6/15 @ 1:30 with Ward Givens, NP, pt canceled it earlier today as she didn't want to see an NP/PA as she wants to see cardiology MD that she wants to see one she saw in hospital. Patient understands discharge instructions? Yes  Patient understands medications and regiment? Yes, reviewed medications and what to stop and new start  Patient understands to bring all medications to this visit? Pt will call our office to setup appointment if she decides she wants to be seen.  Pt educated on the purpose and importance of this visit and pt said she has appt with her PCP today and will get back to [place].

## 2014-08-24 NOTE — Telephone Encounter (Signed)
Unable to leave message, will try to call again later.

## 2014-08-30 ENCOUNTER — Encounter: Payer: Medicare Other | Admitting: Nurse Practitioner

## 2014-09-01 ENCOUNTER — Ambulatory Visit (INDEPENDENT_AMBULATORY_CARE_PROVIDER_SITE_OTHER): Payer: Medicare Other | Admitting: Nurse Practitioner

## 2014-09-01 ENCOUNTER — Encounter: Payer: Self-pay | Admitting: Nurse Practitioner

## 2014-09-01 VITALS — BP 116/60 | HR 68 | Resp 20 | Ht 63.0 in | Wt 128.0 lb

## 2014-09-01 DIAGNOSIS — I222 Subsequent non-ST elevation (NSTEMI) myocardial infarction: Secondary | ICD-10-CM

## 2014-09-01 DIAGNOSIS — I214 Non-ST elevation (NSTEMI) myocardial infarction: Secondary | ICD-10-CM

## 2014-09-01 DIAGNOSIS — I2510 Atherosclerotic heart disease of native coronary artery without angina pectoris: Secondary | ICD-10-CM

## 2014-09-01 DIAGNOSIS — E785 Hyperlipidemia, unspecified: Secondary | ICD-10-CM | POA: Diagnosis not present

## 2014-09-01 DIAGNOSIS — I10 Essential (primary) hypertension: Secondary | ICD-10-CM

## 2014-09-01 MED ORDER — ISOSORBIDE MONONITRATE ER 30 MG PO TB24: 30.0000 mg | ORAL_TABLET | Freq: Every day | ORAL | Status: DC

## 2014-09-01 NOTE — Patient Instructions (Signed)
Medication Instructions:  Your physician has recommended you make the following change in your medication:  1- Increase Imdur 30 mg by mouth daily  Labwork: NONE  Testing/Procedures: NONE  Follow-Up: Your physician recommends that you schedule a follow-up appointment in: 1 month with Dr. Excell Seltzer or with NP/PA on day Dr. Excell Seltzer is here.  Any Other Special Instructions Will Be Listed Below (If Applicable).

## 2014-09-01 NOTE — Progress Notes (Signed)
Patient Name: [name] Date of Encounter: 09/01/2014  Primary Care Provider:  Cala Bradford, MD Primary Cardiologist:  Judie Petit. Excell Seltzer, MD   Chief Complaint  79 year old female status post recent non-ST elevation MI who presents for follow-up.  Past Medical History   Past Medical History  Diagnosis Date  . Dyslipidemia     a. statin intolerant.  . IRON DEFICIENCY ANEMIA SECONDARY TO BLOOD LOSS   . CAD (coronary artery disease) 2006, 2016    a. 08/2014 NSTEMI: Attempted PCI of 85% RPDA - unable to wire, complicated by dissection, case aborted;  b. 08/2014 Relook Cath: LM nl, LAD 60p, SP1 90ost (small), D1 65ost, RI 45/50, RCA nl, RPDA 90 - healed dissection;  b. 08/2014 Echo: EF 55%, mild/mon midapical inferior HK, mild AI, PASP .  . Right bundle branch block   . SUPRAVENTRICULAR TACHYCARDIA   . Raynaud's syndrome   . EXTERNAL HEMORRHOIDS   . GERD   . GASTRIC ANTRAL VASCULAR ECTASIA   . ENDOMETRIOSIS   . SJOGREN'S SYNDROME   . Rosacea, acne   . COLONIC POLYPS, ADENOMATOUS, HX OF   . Essential hypertension   . Insomnia   . Sicca syndrome   . Vitamin D deficiency   . CKD (chronic kidney disease), stage II   . Neuropathy   . Diverticulitis   . Paresthesia 08/01/2014  . Osteoarthritis     a. back/hips.  . Chronic back pain   . Fibromyalgia    Past Surgical History  Procedure Laterality Date  . Cholecystectomy    . Hiatal hernia repair  2005    and paraesophageal  . Esophagogastroduodenoscopy  11/27/2009    APC tretment of lesions  . Colonoscopy w/ biopsies and polypectomy  12/17/2006    adenomatous polyps, external hemorrhoids  . Tear duct surgery Right     "no plugs or anything"  . Breast lumpectomy with radioactive seed localization Right 01/24/2014    Procedure: BREAST LUMPECTOMY WITH RADIOACTIVE SEED LOCALIZATION;  Surgeon: Avel Peace, MD;  Location: [facility];  Service: General;  Laterality: Right;  . Hernia repair    . Appendectomy      . Breast biopsy Bilateral     "both were negative"  . Breast lumpectomy Bilateral   . Total abdominal hysterectomy      w/BSO  . Cardiac catheterization N/A 08/18/2014    Procedure: Left Heart Cath and Coronary Angiography;  Surgeon: Marykay Lex, MD;  Location: [facility] INVASIVE CV LAB;  Service: Cardiovascular;  Laterality: N/A;  . Cardiac catheterization  08/18/2014    Procedure: Coronary Balloon Angioplasty;  Surgeon: Marykay Lex, MD;  Location: [facility] INVASIVE CV LAB;  Service: Cardiovascular;;  . Cardiac catheterization N/A 08/21/2014    Procedure: Left Heart Cath and Coronary Angiography;  Surgeon: Marykay Lex, MD;  Location: [facility] INVASIVE CV LAB;  Service: Cardiovascular;  Laterality: N/A;  . Cardiac catheterization  2006  . Eye surgery      Allergies  Allergies  Allergen Reactions  . Ezetimibe Other (See Comments)    Muscle pain= zetia  . Ezetimibe-Simvastatin Other (See Comments)    Muscle pain= vytorin  . Morphine Other (See Comments)    nightmares  . Rosuvastatin Other (See Comments)    Muscle pain  . Ambien [Zolpidem Tartrate] Other (See Comments)    Fell down  . Plaquenil [Hydroxychloroquine Sulfate] Other (See Comments)  . Polytrim [Polymyxin B-Trimethoprim] Other (See Comments)    unknown  . Prevacid [Lansoprazole] Other (  See Comments)    dizziness    HPI  79 year old female with the above problem list. She was recently admitted to [facility] secondary to chest and arm pain. She ruled in for myocardial infarction. She underwent diagnostic catheterization revealing severe disease in the distal posterior descending artery. An attempt was made at wiring this lesion however this resulted in a dissection and the case was aborted. She was maintained on anticoagulative and therapy and subsequently underwent relook diagnostic catheterization 3 days later which continued to show a 90% stenosis in the PDA with a healed area of dissection. Medical therapy was recommended. She  was maintained on aspirin, Plavix, beta blocker, and long-acting nitrate therapy. She is intolerant to statins and Zetia.  Since her discharge from the hospital, she has had intermittent left-sided chest discomfort and also arm discomfort and numbness. She says it is difficult for her to know to what extent symptoms are cardiac in nature as she was having the symptoms prior to her heart attack and has been evaluated by neurology with an MRI pending. She has been walking daily and has not had significant chest discomfort while walking. Though she has not been profoundly dyspneic during walking, she has noted that at the end of a walk she is fairly tired. She denies PND, orthopnea, dizziness, syncope, edema, or early satiety. She has had bruising of the right groin and moving down the right thigh without discomfort.  Home Medications  Prior to Admission medications   Medication Sig Start Date End Date Taking? Authorizing Provider  acetaminophen (TYLENOL) 500 MG tablet Take 1,000 mg by mouth every 6 (six) hours as needed (pain).   Yes Historical Provider, MD  aspirin 81 MG tablet Take 81 mg by mouth daily.   Yes Historical Provider, MD  Biotin 5000 MCG CAPS Take 1 capsule by mouth daily.   Yes Historical Provider, MD  clopidogrel (PLAVIX) 75 MG tablet Take 1 tablet (75 mg total) by mouth daily with breakfast. 08/22/14  Yes Abelino Derrick, PA-C  cyclobenzaprine (FLEXERIL) 10 MG tablet Take 10 mg by mouth daily as needed for muscle spasms.   Yes Historical Provider, MD  ferrous sulfate 325 (65 FE) MG tablet Take 325 mg by mouth daily with breakfast.   Yes Historical Provider, MD  fluticasone (FLONASE) 50 MCG/ACT nasal spray Place 2 sprays into both nostrils daily.   Yes Historical Provider, MD  isosorbide mononitrate (IMDUR) 30 MG 24 hr tablet Take 1 tablet (30 mg total) by mouth daily. 09/01/14  Yes Ok Anis, NP  ketotifen (REFRESH EYE ITCH RELIEF) 0.025 % ophthalmic solution Place 2 drops into  both eyes 2 (two) times daily as needed (for dry eyes).   Yes Historical Provider, MD  metoprolol succinate (TOPROL-XL) 25 MG 24 hr tablet Take 0.5 tablets (12.5 mg total) by mouth daily. 08/21/14  Yes Luke K Kilroy, PA-C  nitroGLYCERIN (NITROSTAT) 0.4 MG SL tablet Place 1 tablet (0.4 mg total) under the tongue every 5 (five) minutes as needed for chest pain. 08/21/14  Yes Luke K Kilroy, PA-C  temazepam (RESTORIL) 15 MG capsule Take 15 mg by mouth at bedtime as needed for sleep. SLEEP AID 08/03/13  Yes Historical Provider, MD    Review of Systems  Ms.Valentina Lucks has been experiencing left chest and arm discomfort as well as left arm numbness. As above, she is not sure what extent this might be cardiac in origin as she had this going on prior to her heart attack. She  denies PND, orthopnea, dizziness, syncope, edema, palpitations, or early satiety.  All other systems reviewed and are otherwise negative except as noted above.  Physical Exam  VS:  BP 116/60 mmHg  Pulse 68  Resp 20  Ht 5\' 3"  (1.6 m)  Wt 128 lb (58.06 kg)  BMI 22.68 kg/m2 , BMI Body mass index is 22.68 kg/(m^2). GEN: Well nourished, well developed, in no acute distress. HEENT: normal. Neck: Supple, no JVD, carotid bruits, or masses. Cardiac: RRR, no murmurs, rubs, or gallops. No clubbing, cyanosis, edema.  Radials/DP/PT 2+ and equal bilaterally. Right groin is mildly ecchymotic with extension of mild, resolving ecchymosis down the anterior and medial right thigh. No bruit or hematoma noted. Respiratory:  Respirations regular and unlabored, clear to auscultation bilaterally. GI: Soft, nontender, nondistended, BS + x 4. MS: no deformity or atrophy. Skin: warm and dry, no rash. Neuro:  Strength and sensation are intact. Psych: Normal affect.  Accessory Clinical Findings  ECG - regular sinus rhythm, 68, no acute ST or T changes.  Assessment & Plan  1.  Non-ST segment elevation myocardial infarction, subsequent episode of care/CAD  status post attempted percutaneous intervention upon the right PDA which was aborted secondary to dissection. She was treated with anticoagulation for 3 days with subsequent catheterization on June 6 revealing healed dissection with residual 90% PDA disease. Medical therapy was recommended. She has had intermittent chest discomfort and arm discomfort as well as left arm numbness. She had been having similar symptoms prior to her myocardial infarction and is currently being evaluated by neurology with MRI pending. She has been walking daily and has noted fatigue by the end of her walk. I will increase her isosorbide mononitrate 30 mg daily to see if this helps to cut down on her chest pain symptoms. She otherwise remains on aspirin, Plavix and beta blocker therapy. She does have short acting nitrates if needed. She is not on a statin or Zetia secondary to prior intolerance with severe fatigue and myalgias. Of note, her LDL during her hospitalization was only 88, thus she is not an ideal candidate for pcsk9 inhibitor therapy.  2. Hyperlipidemia: LDL of 88. She is intolerant to statins and Zetia.  3. Disposition: I will try to arrange for follow-up in one month to reassess how she is doing on a slightly higher dose of nitrate. She would like to see Dr. Excell Seltzer if at all possible.  Nicolasa Ducking, NP 09/01/2014, 4:14 PM

## 2014-09-07 ENCOUNTER — Telehealth (HOSPITAL_COMMUNITY): Payer: Self-pay | Admitting: *Deleted

## 2014-09-07 NOTE — Telephone Encounter (Signed)
Received signed MD order for Cardiac rehab.  Pt called to assess readiness to participate in CR.  Pt with complaints of leg and back pain.  Pt is to have a MRI on July 1.  Pt indicates she is not ready to participate in CR.  Pt given CPT code for insurance verification.  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  Pt also given phone number for Health Information.  Pt wants her medical records for herself.  Rehab staff will call back in 2 weeks for update. Alanson Aly, BSN

## 2014-09-15 ENCOUNTER — Encounter: Payer: Self-pay | Admitting: Cardiovascular Disease

## 2014-09-15 ENCOUNTER — Ambulatory Visit
Admission: RE | Admit: 2014-09-15 | Discharge: 2014-09-15 | Disposition: A | Discharge status: Home / Self Care | Payer: Medicare Other | Source: Ambulatory Visit | Attending: Neurology | Admitting: Neurology

## 2014-09-15 DIAGNOSIS — R202 Paresthesia of skin: Secondary | ICD-10-CM

## 2014-09-15 NOTE — Telephone Encounter (Signed)
Follow up  Pt called back stating that she was advised that lauren wanted her to come in for a sooner appt. i dont see a note as to where lauren wants to put her on Dr. Randolm Idol schedule. Please assist if possible

## 2014-09-15 NOTE — Telephone Encounter (Signed)
Left message to call back  

## 2014-09-18 ENCOUNTER — Telehealth: Payer: Self-pay | Admitting: Neurology

## 2014-09-18 NOTE — Telephone Encounter (Signed)
I called the patient. MRI of the brain shows moderate SVD, minimally changed from study done in 2015. Doppler is pending. ? Need EEG.   MRI brain 09/18/14:  IMPRESSION: This is an abnormal MRI of the brain without contrast showed the following: 1. T2/flair hyperintense foci in the white matter of the cerebellum and both hemispheres as described above. The extent in the hemispheres is moderately advanced. There has been mild progression since 06/07/2013. 2. There are no acute findings.

## 2014-09-19 NOTE — Telephone Encounter (Signed)
Pt calling back re appt with Excell Seltzer that Leotis Shames was giving her this week, pls call [phone]

## 2014-09-19 NOTE — Telephone Encounter (Signed)
This encounter was created in error - please disregard.

## 2014-09-19 NOTE — Telephone Encounter (Signed)
lmtcb

## 2014-09-20 ENCOUNTER — Ambulatory Visit (INDEPENDENT_AMBULATORY_CARE_PROVIDER_SITE_OTHER): Payer: Medicare Other | Admitting: Cardiovascular Disease

## 2014-09-20 ENCOUNTER — Encounter: Payer: Self-pay | Admitting: Cardiovascular Disease

## 2014-09-20 VITALS — BP 128/72 | HR 68 | Ht 63.0 in | Wt 129.0 lb

## 2014-09-20 DIAGNOSIS — I25118 Atherosclerotic heart disease of native coronary artery with other forms of angina pectoris: Secondary | ICD-10-CM

## 2014-09-20 NOTE — Patient Instructions (Signed)
Medication Instructions:  Your physician recommends that you continue on your current medications as directed. Please refer to the Current Medication list given to you today.  Labwork: No new orders.   Testing/Procedures: No new orders.   Follow-Up: Your physician wants you to follow-up in: 6 MONTHS with Dr Excell Seltzer.  You will receive a reminder letter in the mail two months in advance. If you don't receive a letter, please call our office to schedule the follow-up appointment.  You have been referred to Cardiac Rehabilitation   Any Other Special Instructions Will Be Listed Below (If Applicable).

## 2014-09-20 NOTE — Progress Notes (Signed)
Cardiology Office Note Date:  09/20/2014   ID:  SHELVIE SALSBERRY, DOB 10-20-1935, MRN [id number]  PCP:  Cala Bradford, MD  Cardiologist:  Tonny Bollman, MD    Chief Complaint  Patient presents with  . Fatigue   History of Present Illness: [name] is a 79 y.o. female who presents for follow-up of CAD. She was admitted to [facility] last month with chest and arm pain. She ruled in for myocardial infarction. She underwent diagnostic catheterization revealing severe disease in the distal posterior descending artery. An attempt was made at wiring this lesion however this resulted in a dissection and the case was aborted.  She was previously noted to have a 70% stenosis in that location at the time of cardiac catheterization in 2006. She was maintained on anticoagulant therapy and subsequently underwent relook diagnostic catheterization 3 days later which continued to show a 90% stenosis in the PDA with a healed area of dissection. Medical therapy was recommended. She was maintained on aspirin, Plavix, beta blocker, and long-acting nitrate therapy. She is intolerant to statins and Zetia.  The patient doesn't feel well. She complains of marked fatigue. This is somewhat long-standing but worse since her hospitalization. She has not had chest pain. She complains of a constant numbness in her left arm and also going down her left leg. She has undergone evaluation with spinal imaging. Apparently this has been unrevealing. She denies shortness of breath , orthopnea, or PND. She denies lightheadedness, palpitations, or syncope.   Past Medical History  Diagnosis Date  . Dyslipidemia     a. statin intolerant.  . IRON DEFICIENCY ANEMIA SECONDARY TO BLOOD LOSS   . CAD (coronary artery disease) 2006, 2016    a. 08/2014 NSTEMI: Attempted PCI of 85% RPDA - unable to wire, complicated by dissection, case aborted;  b. 08/2014 Relook Cath: LM nl, LAD 60p, SP1 90ost (small), D1 65ost, RI 45/50, RCA nl, RPDA 90  - healed dissection;  b. 08/2014 Echo: EF 55%, mild/mon midapical inferior HK, mild AI, PASP .  . Right bundle branch block   . SUPRAVENTRICULAR TACHYCARDIA   . Raynaud's syndrome   . EXTERNAL HEMORRHOIDS   . GERD   . GASTRIC ANTRAL VASCULAR ECTASIA   . ENDOMETRIOSIS   . SJOGREN'S SYNDROME   . Rosacea, acne   . COLONIC POLYPS, ADENOMATOUS, HX OF   . Essential hypertension   . Insomnia   . Sicca syndrome   . Vitamin D deficiency   . CKD (chronic kidney disease), stage II   . Neuropathy   . Diverticulitis   . Paresthesia 08/01/2014  . Osteoarthritis     a. back/hips.  . Chronic back pain   . Fibromyalgia     Past Surgical History  Procedure Laterality Date  . Cholecystectomy    . Hiatal hernia repair  2005    and paraesophageal  . Esophagogastroduodenoscopy  11/27/2009    APC tretment of lesions  . Colonoscopy w/ biopsies and polypectomy  12/17/2006    adenomatous polyps, external hemorrhoids  . Tear duct surgery Right     "no plugs or anything"  . Breast lumpectomy with radioactive seed localization Right 01/24/2014    Procedure: BREAST LUMPECTOMY WITH RADIOACTIVE SEED LOCALIZATION;  Surgeon: Avel Peace, MD;  Location: [facility];  Service: General;  Laterality: Right;  . Hernia repair    . Appendectomy    . Breast biopsy Bilateral     "both were negative"  . Breast lumpectomy  Bilateral   . Total abdominal hysterectomy      w/BSO  . Cardiac catheterization N/A 08/18/2014    Procedure: Left Heart Cath and Coronary Angiography;  Surgeon: Marykay Lex, MD;  Location: [facility] INVASIVE CV LAB;  Service: Cardiovascular;  Laterality: N/A;  . Cardiac catheterization  08/18/2014    Procedure: Coronary Balloon Angioplasty;  Surgeon: Marykay Lex, MD;  Location: [facility] INVASIVE CV LAB;  Service: Cardiovascular;;  . Cardiac catheterization N/A 08/21/2014    Procedure: Left Heart Cath and Coronary Angiography;  Surgeon: Marykay Lex, MD;  Location: [facility] INVASIVE CV  LAB;  Service: Cardiovascular;  Laterality: N/A;  . Cardiac catheterization  2006  . Eye surgery      Current Outpatient Prescriptions  Medication Sig Dispense Refill  . acetaminophen (TYLENOL) 500 MG tablet Take 1,000 mg by mouth every 6 (six) hours as needed (pain).    [name] aspirin 81 MG tablet Take 81 mg by mouth daily.    . Biotin 5000 MCG CAPS Take 1 capsule by mouth daily.    . clopidogrel (PLAVIX) 75 MG tablet Take 1 tablet (75 mg total) by mouth daily with breakfast. 90 tablet 3  . cyclobenzaprine (FLEXERIL) 10 MG tablet Take 10 mg by mouth daily as needed for muscle spasms.    . ferrous sulfate 325 (65 FE) MG tablet Take 325 mg by mouth daily with breakfast.    . fluticasone (FLONASE) 50 MCG/ACT nasal spray Place 2 sprays into both nostrils daily.    . isosorbide mononitrate (IMDUR) 30 MG 24 hr tablet Take 1 tablet (30 mg total) by mouth daily. 30 tablet 11  . metoprolol succinate (TOPROL-XL) 25 MG 24 hr tablet Take 0.5 tablets (12.5 mg total) by mouth daily. 30 tablet 5  . nitroGLYCERIN (NITROSTAT) 0.4 MG SL tablet Place 1 tablet (0.4 mg total) under the tongue every 5 (five) minutes as needed for chest pain. 25 tablet 2  . temazepam (RESTORIL) 15 MG capsule Take 15 mg by mouth at bedtime as needed for sleep. SLEEP AID     No current facility-administered medications for this visit.   Allergies:   Ezetimibe; Ezetimibe-simvastatin; Morphine; Rosuvastatin; Ambien; Plaquenil; Polytrim; and Prevacid   Social History:  The patient  reports that she has never smoked. She has never used smokeless tobacco. She reports that she drinks alcohol. She reports that she does not use illicit drugs.   Family History:  The patient's  family history includes Breast cancer in her daughter, sister, sister, and sister; Cancer in her daughter and sister; Coronary artery disease in her brother, brother, brother, father, and mother; Diabetes in her mother; Heart attack in her father; Heart disease in her  brother, father, and mother; Heart failure in her mother; Hyperlipidemia in her mother; Hypertension in her father and mother; Kidney failure in her mother; Peripheral vascular disease in her mother. There is no history of Colon cancer, Esophageal cancer, Stomach cancer, or Rectal cancer.   ROS:  Please see the history of present illness.  Otherwise, review of systems is positive for fatigue, weakness.  All other systems are reviewed and negative.   PHYSICAL EXAM: VS:  BP 128/72 mmHg  Pulse 68  Ht 5\' 3"  (1.6 m)  Wt 129 lb (58.514 kg)  BMI 22.86 kg/m2 , BMI Body mass index is 22.86 kg/(m^2). GEN: Well nourished, well developed, in no acute distress HEENT: normal Neck: no JVD, no masses. No carotid bruits Cardiac: RRR without murmur or gallop  Respiratory:  clear to auscultation bilaterally, normal work of breathing GI: soft, nontender, nondistended, + BS MS: no deformity or atrophy Ext: no pretibial edema, pedal pulses 2+= bilaterally Skin: warm and dry, no rash Neuro:  Strength and sensation are intact Psych: euthymic mood, full affect  EKG:  EKG is not ordered today.  Recent Labs: 02/22/2014: ALT 15 08/18/2014: TSH 1.629 08/22/2014: BUN 13; Creatinine, Ser 0.92; Hemoglobin 10.4*; Platelets 166; Potassium 4.1; Sodium 138   Lipid Panel     Component Value Date/Time   CHOL 162 08/19/2014 0255   TRIG 98 08/19/2014 0255   HDL 54 08/19/2014 0255   CHOLHDL 3.0 08/19/2014 0255   VLDL 20 08/19/2014 0255   LDLCALC 88 08/19/2014 0255      Wt Readings from Last 3 Encounters:  09/20/14 129 lb (58.514 kg)  09/01/14 128 lb (58.06 kg)  08/22/14 127 lb 13.9 oz (58 kg)    Cardiac Studies Reviewed: Echo 08/18/2014 Study Conclusions  - Left ventricle: There is mild/moderate hypokinesis of the mid/apical inferior segments. The cavity size was normal. Wall thickness was normal. The estimated ejection fraction was 55%. - Aortic valve: Sclerosis without stenosis. There was  mild regurgitation. - Right ventricle: The cavity size was normal. Systolic function was normal. - Pulmonary arteries: PA peak pressure: 43 mm Hg (S).  Cardiac Cath 08/18/2014: Conclusion    1. Severe focal stenosis of the proximal RPDA with moderate to Severe disease in the proximal LAD. 2. RPDA lesion, 85% stenosed. After attempted PTCA with wire related dissection, there is a 99% residual stenosis post intervention attempt but with TIMI 2 flow and no anginal pain. 3. Unsuccessful Attempted PTCA of rPDA with resulting Dissection but with restored flow after wire removal - TIMI 2 flow. 4. Prox LAD lesion, ~60% stenosed (somewhat hazy shelflike lesion at the takeoff of a First Diagonal with a 60% ostial stenosis and major septal perforator trunk that has a 90% ostial stenosis) 5. Large-caliber ramus intermedius that courses as a large OM branch with moderate diffuse disease. 6. Preserved/low normal EF roughly 50-55% with apical hypo-kinesis/akinesis. 7. Normal LVEDP  Despite presence of the extensive dissection in the PDA with only TIMI 2 flow, the patient remained angina (left arm pain) free and hemodynamically stable throughout the entire procedure despite dissection and poor flow down the PDA.   ASSESSMENT AND PLAN: 1.   CAD, native vessel: we spent a great deal of time discussing her coronary status. She has not had any recurrent ischemic symptoms since her recent attempted PCI. I think cardiac rehabilitation will probably help her more than anything at this point. I have highly recommended that she enroll. Will continue her current medical program. Depending on her progress, will consider an exercise stress Myoview in 6 months to assess her residual LAD stenosis and functional significance of PDA disease.  She will continue on aspirin and Plavix.  2. Hyperlipidemia: Patient with multiple drug intolerances. Will work on continued lifestyle modification.  Current medicines are reviewed  with the patient today.  The patient does not have concerns regarding medicines.  Labs/ tests ordered today include:  No orders of the defined types were placed in this encounter.   Disposition:   FU 6 months  Signed, Tonny Bollman, MD  09/20/2014 5:37 PM    [facility] [address] [place], [place], [place]  [address] Phone: [phone]; Fax: [phone]

## 2014-09-21 ENCOUNTER — Telehealth (HOSPITAL_COMMUNITY): Payer: Self-pay | Admitting: *Deleted

## 2014-09-21 NOTE — Telephone Encounter (Signed)
Received 2nd signed MD order from Dr. Excell Seltzer.  Called and left message for pt to return call for sign up.  Contact information provided. Alanson Aly, BSN

## 2014-10-04 ENCOUNTER — Telehealth: Payer: Self-pay | Admitting: Cardiovascular Disease

## 2014-10-04 NOTE — Telephone Encounter (Signed)
I will forward this message to Dr Excell Seltzer to recommend what exercises the pt can participate in at the [facility] or give limitations.

## 2014-10-04 NOTE — Telephone Encounter (Signed)
New message      Pt cannot afford cardiac rehab.  Ins will make her pay 50.00 per visit and she cannot afford that.  Pt can go to the [facility] but need to be able to tell them what exercises she need to do.  Please call

## 2014-10-04 NOTE — Telephone Encounter (Signed)
Would focus on light aerobic acitivity such as elliptical / treadmill. Also ok to do high-repetition, low weight exercises for strengthening. Shouldn't lift greater than 20 pounds.  thx

## 2014-10-04 NOTE — Telephone Encounter (Signed)
I spoke with the pt and made her aware of Dr Cooper's recommendations.  

## 2014-10-06 ENCOUNTER — Ambulatory Visit: Payer: Medicare Other | Admitting: Nurse Practitioner

## 2014-10-11 ENCOUNTER — Other Ambulatory Visit: Payer: Self-pay | Admitting: Otolaryngology

## 2014-10-11 DIAGNOSIS — Q670 Congenital facial asymmetry: Secondary | ICD-10-CM

## 2014-10-13 ENCOUNTER — Ambulatory Visit
Admission: RE | Admit: 2014-10-13 | Discharge: 2014-10-13 | Disposition: A | Discharge status: Home / Self Care | Payer: Medicare Other | Source: Ambulatory Visit | Attending: Otolaryngology | Admitting: Otolaryngology

## 2014-10-13 DIAGNOSIS — Q670 Congenital facial asymmetry: Secondary | ICD-10-CM

## 2014-10-20 ENCOUNTER — Other Ambulatory Visit (HOSPITAL_COMMUNITY): Payer: Self-pay | Admitting: Family Medicine

## 2014-10-20 DIAGNOSIS — I729 Aneurysm of unspecified site: Secondary | ICD-10-CM

## 2014-10-23 ENCOUNTER — Ambulatory Visit (HOSPITAL_COMMUNITY)
Admission: RE | Admit: 2014-10-23 | Discharge: 2014-10-23 | Disposition: A | Discharge status: Home / Self Care | Payer: Medicare Other | Source: Ambulatory Visit | Attending: Family Medicine | Admitting: Family Medicine

## 2014-10-23 DIAGNOSIS — I729 Aneurysm of unspecified site: Secondary | ICD-10-CM

## 2014-10-23 DIAGNOSIS — M79604 Pain in right leg: Secondary | ICD-10-CM | POA: Diagnosis present

## 2014-10-23 NOTE — Progress Notes (Signed)
VASCULAR LAB PRELIMINARY  PRELIMINARY  PRELIMINARY  PRELIMINARY  Right lower extremity duplex completed.    Preliminary report:  Evaluation for a right pseudoaneurysm post cardiac cath. 08/21/2014. No evidence of a pseudoaneurysm or A-V fistula of the right lower extremity  Dejanee Thibeaux, RVS 10/23/2014, 9:20 AM

## 2014-11-09 ENCOUNTER — Encounter: Payer: Self-pay | Admitting: Cardiology

## 2014-11-10 ENCOUNTER — Other Ambulatory Visit: Payer: Self-pay

## 2014-11-10 MED ORDER — CLOPIDOGREL BISULFATE 75 MG PO TABS: 75.0000 mg | ORAL_TABLET | Freq: Every day | ORAL | Status: DC

## 2014-11-10 MED ORDER — ISOSORBIDE MONONITRATE ER 30 MG PO TB24: 30.0000 mg | ORAL_TABLET | Freq: Every day | ORAL | Status: DC

## 2014-12-27 ENCOUNTER — Ambulatory Visit (HOSPITAL_BASED_OUTPATIENT_CLINIC_OR_DEPARTMENT_OTHER): Payer: Medicare Other | Attending: Family Medicine | Admitting: Radiology

## 2014-12-27 VITALS — Ht 62.0 in | Wt 125.0 lb

## 2014-12-27 DIAGNOSIS — R5383 Other fatigue: Secondary | ICD-10-CM | POA: Insufficient documentation

## 2014-12-27 DIAGNOSIS — G4761 Periodic limb movement disorder: Secondary | ICD-10-CM | POA: Diagnosis not present

## 2014-12-27 DIAGNOSIS — G4719 Other hypersomnia: Secondary | ICD-10-CM | POA: Diagnosis present

## 2014-12-27 DIAGNOSIS — R0683 Snoring: Secondary | ICD-10-CM | POA: Diagnosis not present

## 2014-12-27 DIAGNOSIS — I10 Essential (primary) hypertension: Secondary | ICD-10-CM | POA: Insufficient documentation

## 2015-01-07 DIAGNOSIS — G4719 Other hypersomnia: Secondary | ICD-10-CM | POA: Diagnosis not present

## 2015-01-07 NOTE — Progress Notes (Signed)
  Patient Name: Hailea, Mcclarin Date: 12/27/2014 Gender: Female D.O.B: 12/11/1935 Age (years): 78 Referring Provider: Laurann Montana Height (inches): 62 Interpreting Physician: Jetty Duhamel MD, ABSM Weight (lbs): 125 RPSGT: Ulyess Mort BMI: 23 MRN: [id number] Neck Size: 12.00 CLINICAL INFORMATION Sleep Study Type: NPSG Indication for sleep study: Excessive Daytime Sleepiness, Fatigue, Hypertension Epworth Sleepiness Score: 4  SLEEP STUDY TECHNIQUE As per the AASM Manual for the Scoring of Sleep and Associated Events v2.3 (April 2016) with a hypopnea requiring 4% desaturations. The channels recorded and monitored were frontal, central and occipital EEG, electrooculogram (EOG), submentalis EMG (chin), nasal and oral airflow, thoracic and abdominal wall motion, anterior tibialis EMG, snore microphone, electrocardiogram, and pulse oximetry.  MEDICATIONS Patient's medications include: charted for review Medications self-administered by patient during sleep study : temazepam  SLEEP ARCHITECTURE The study was initiated at 10:15:27 PM and ended at 5:14:47 AM. Sleep onset time was 16.9 minutes and the sleep efficiency was 79.8%. The total sleep time was 334.5 minutes. Stage REM latency was 89.5 minutes. The patient spent 23.02% of the night in stage N1 sleep, 67.86% in stage N2 sleep, 0.00% in stage N3 and 9.12% in REM. Alpha intrusion was absent. Supine sleep was 11.09%.  RESPIRATORY PARAMETERS The overall apnea/hypopnea index (AHI) was 2.0 per hour. There were 3 total apneas, including 0 obstructive, 3 central and 0 mixed apneas. There were 8 hypopneas and 62 RERAs. The AHI during Stage REM sleep was 9.8 per hour. AHI while supine was 4.9 per hour. The mean oxygen saturation was 95.15%. The minimum SpO2 during sleep was 64.00%. Soft snoring was noted during this study. Wake after sleep onset 67.9 minutes  CARDIAC DATA The 2 lead EKG demonstrated sinus rhythm. The mean  heart rate was 70.78 beats per minute. Other EKG findings include: None.  LEG MOVEMENT DATA The total PLMS were 542 with a resulting PLMS index of 97.22. Associated arousal with leg movement index was 5.2/ hr .  IMPRESSIONS - No significant obstructive sleep apnea occurred during this study (AHI = 2.0/h). - No significant central sleep apnea occurred during this study (CAI = 0.5/h). - Oxygen desaturation was noted during this study (Min O2 = 64.00%). The mean oxygen saturation was 95.15% - The patient snored with Soft snoring volume.The mean oxygen saturation was 95.15% - No cardiac abnormalities were noted during this study. - Severe periodic limb movements of sleep occurred during the study. Associated arousals were mild at 5.2/ hr  DIAGNOSIS - Periodic Limb Movement Syndrome (327.51 [G47.61 ICD-10]) - Normal study  RECOMMENDATIONS - Avoid alcohol, sedatives and other CNS depressants that may worsen sleep apnea and disrupt normal sleep architecture. - Sleep hygiene should be reviewed to assess factors that may improve sleep quality. - Weight management and regular exercise should be initiated or continued if appropriate.  Waymon Budge Diplomate, American Board of Sleep Medicine  ELECTRONICALLY SIGNED ON:  01/07/2015, 9:50 AM [facility] PH: [phone]   FX: [phone] ACCREDITED BY THE AMERICAN ACADEMY OF SLEEP MEDICINE

## 2015-01-16 ENCOUNTER — Emergency Department (HOSPITAL_COMMUNITY): Payer: No Typology Code available for payment source

## 2015-01-16 ENCOUNTER — Encounter (HOSPITAL_COMMUNITY): Payer: Self-pay | Admitting: *Deleted

## 2015-01-16 ENCOUNTER — Emergency Department (HOSPITAL_COMMUNITY)
Admission: EM | Admit: 2015-01-16 | Discharge: 2015-01-17 | Disposition: A | Discharge status: Home / Self Care | Payer: No Typology Code available for payment source | Attending: Emergency Medicine | Admitting: Emergency Medicine

## 2015-01-16 DIAGNOSIS — S29001A Unspecified injury of muscle and tendon of front wall of thorax, initial encounter: Secondary | ICD-10-CM | POA: Diagnosis present

## 2015-01-16 DIAGNOSIS — Z8719 Personal history of other diseases of the digestive system: Secondary | ICD-10-CM | POA: Insufficient documentation

## 2015-01-16 DIAGNOSIS — I129 Hypertensive chronic kidney disease with stage 1 through stage 4 chronic kidney disease, or unspecified chronic kidney disease: Secondary | ICD-10-CM | POA: Insufficient documentation

## 2015-01-16 DIAGNOSIS — Y9389 Activity, other specified: Secondary | ICD-10-CM | POA: Diagnosis not present

## 2015-01-16 DIAGNOSIS — N182 Chronic kidney disease, stage 2 (mild): Secondary | ICD-10-CM | POA: Insufficient documentation

## 2015-01-16 DIAGNOSIS — Z79899 Other long term (current) drug therapy: Secondary | ICD-10-CM | POA: Insufficient documentation

## 2015-01-16 DIAGNOSIS — Y998 Other external cause status: Secondary | ICD-10-CM | POA: Insufficient documentation

## 2015-01-16 DIAGNOSIS — G8929 Other chronic pain: Secondary | ICD-10-CM | POA: Diagnosis not present

## 2015-01-16 DIAGNOSIS — I2510 Atherosclerotic heart disease of native coronary artery without angina pectoris: Secondary | ICD-10-CM | POA: Diagnosis not present

## 2015-01-16 DIAGNOSIS — M797 Fibromyalgia: Secondary | ICD-10-CM | POA: Insufficient documentation

## 2015-01-16 DIAGNOSIS — I10 Essential (primary) hypertension: Secondary | ICD-10-CM

## 2015-01-16 DIAGNOSIS — Y92410 Unspecified street and highway as the place of occurrence of the external cause: Secondary | ICD-10-CM | POA: Insufficient documentation

## 2015-01-16 DIAGNOSIS — Z86010 Personal history of colonic polyps: Secondary | ICD-10-CM | POA: Diagnosis not present

## 2015-01-16 DIAGNOSIS — Z7902 Long term (current) use of antithrombotics/antiplatelets: Secondary | ICD-10-CM | POA: Diagnosis not present

## 2015-01-16 DIAGNOSIS — D649 Anemia, unspecified: Secondary | ICD-10-CM | POA: Insufficient documentation

## 2015-01-16 DIAGNOSIS — R079 Chest pain, unspecified: Secondary | ICD-10-CM

## 2015-01-16 DIAGNOSIS — Z7982 Long term (current) use of aspirin: Secondary | ICD-10-CM | POA: Diagnosis not present

## 2015-01-16 LAB — CBC
HCT: 33.1 % — ABNORMAL LOW (ref 36.0–46.0)
HEMOGLOBIN: 11 g/dL — AB (ref 12.0–15.0)
MCH: 27.8 pg (ref 26.0–34.0)
MCHC: 33.2 g/dL (ref 30.0–36.0)
MCV: 83.8 fL (ref 78.0–100.0)
Platelets: 185 10*3/uL (ref 150–400)
RBC: 3.95 MIL/uL (ref 3.87–5.11)
RDW: 15.9 % — ABNORMAL HIGH (ref 11.5–15.5)
WBC: 5.1 10*3/uL (ref 4.0–10.5)

## 2015-01-16 LAB — BASIC METABOLIC PANEL
ANION GAP: 9 (ref 5–15)
BUN: 14 mg/dL (ref 6–20)
CO2: 22 mmol/L (ref 22–32)
CREATININE: 0.9 mg/dL (ref 0.44–1.00)
Calcium: 9.2 mg/dL (ref 8.9–10.3)
Chloride: 97 mmol/L — ABNORMAL LOW (ref 101–111)
GLUCOSE: 86 mg/dL (ref 65–99)
Potassium: 4.2 mmol/L (ref 3.5–5.1)
Sodium: 128 mmol/L — ABNORMAL LOW (ref 135–145)

## 2015-01-16 LAB — I-STAT TROPONIN, ED: Troponin i, poc: 0 ng/mL (ref 0.00–0.08)

## 2015-01-16 MED ORDER — SODIUM CHLORIDE 0.9 % IV BOLUS (SEPSIS)
1000.0000 mL | Freq: Once | INTRAVENOUS | Status: AC
  Administered 2015-01-17: 1000 mL via INTRAVENOUS

## 2015-01-16 NOTE — ED Notes (Signed)
Pt states that she feels her heart pounding. EKG in process. States she had chest pain 30 minutes ago but it has gone.
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
## 2015-01-17 DIAGNOSIS — S29001A Unspecified injury of muscle and tendon of front wall of thorax, initial encounter: Secondary | ICD-10-CM | POA: Diagnosis not present

## 2015-01-17 LAB — I-STAT TROPONIN, ED: TROPONIN I, POC: 0.01 ng/mL (ref 0.00–0.08)

## 2015-01-17 MED ORDER — ASPIRIN 81 MG PO CHEW
324.0000 mg | CHEWABLE_TABLET | Freq: Once | ORAL | Status: AC
  Administered 2015-01-17: 324 mg via ORAL
  Filled 2015-01-17: qty 4

## 2015-01-17 NOTE — ED Notes (Signed)
Discharge instructions reviewed - voiced understanding.  Instructed to call PMD in the morning and talk to him about her visit.

## 2015-01-25 ENCOUNTER — Other Ambulatory Visit: Payer: Self-pay

## 2015-01-25 DIAGNOSIS — Z1231 Encounter for screening mammogram for malignant neoplasm of breast: Secondary | ICD-10-CM

## 2015-02-26 ENCOUNTER — Ambulatory Visit
Admission: RE | Admit: 2015-02-26 | Discharge: 2015-02-26 | Disposition: A | Discharge status: Home / Self Care | Payer: Medicare Other | Source: Ambulatory Visit

## 2015-02-26 DIAGNOSIS — Z1231 Encounter for screening mammogram for malignant neoplasm of breast: Secondary | ICD-10-CM

## 2015-03-01 ENCOUNTER — Other Ambulatory Visit: Payer: Self-pay | Admitting: General Surgery

## 2015-03-01 DIAGNOSIS — R928 Other abnormal and inconclusive findings on diagnostic imaging of breast: Secondary | ICD-10-CM

## 2015-03-05 ENCOUNTER — Other Ambulatory Visit: Payer: Self-pay

## 2015-03-05 ENCOUNTER — Other Ambulatory Visit: Payer: Self-pay | Admitting: General Surgery

## 2015-03-05 DIAGNOSIS — R928 Other abnormal and inconclusive findings on diagnostic imaging of breast: Secondary | ICD-10-CM

## 2015-03-07 ENCOUNTER — Other Ambulatory Visit: Payer: Self-pay | Admitting: General Surgery

## 2015-03-07 ENCOUNTER — Ambulatory Visit
Admission: RE | Admit: 2015-03-07 | Discharge: 2015-03-07 | Disposition: A | Discharge status: Home / Self Care | Payer: Medicare Other | Source: Ambulatory Visit | Attending: General Surgery | Admitting: General Surgery

## 2015-03-07 DIAGNOSIS — R928 Other abnormal and inconclusive findings on diagnostic imaging of breast: Secondary | ICD-10-CM

## 2015-03-15 ENCOUNTER — Telehealth: Payer: Self-pay | Admitting: Cardiovascular Disease

## 2015-03-15 NOTE — Telephone Encounter (Signed)
New Message  Pt stated  c/o of frequent nose bleeds recently. Pt does not know what to do next. Please call back and discuss.

## 2015-03-15 NOTE — Telephone Encounter (Signed)
Left message on machine for pt to contact the office.   

## 2015-03-16 NOTE — Telephone Encounter (Signed)
I spoke with the pt and she complains of a nose bleed that occurs about once a week and has been occuring for a while. The pt said her nose will just start "pouring blood and then it clots".  I spoke with the pt about using a humidifier and saline nasal spray.  The pt will try these things and call the office if she continues to have issues. Pt agreed with plans.

## 2015-03-28 ENCOUNTER — Encounter (HOSPITAL_COMMUNITY): Payer: Self-pay | Admitting: Emergency Medicine

## 2015-03-28 ENCOUNTER — Telehealth: Payer: Self-pay | Admitting: Cardiovascular Disease

## 2015-03-28 ENCOUNTER — Emergency Department (HOSPITAL_COMMUNITY): Payer: Medicare Other

## 2015-03-28 ENCOUNTER — Emergency Department (HOSPITAL_COMMUNITY)
Admission: EM | Admit: 2015-03-28 | Discharge: 2015-03-28 | Disposition: A | Discharge status: Home / Self Care | Payer: Medicare Other | Attending: Emergency Medicine | Admitting: Emergency Medicine

## 2015-03-28 DIAGNOSIS — R079 Chest pain, unspecified: Secondary | ICD-10-CM | POA: Insufficient documentation

## 2015-03-28 DIAGNOSIS — Z872 Personal history of diseases of the skin and subcutaneous tissue: Secondary | ICD-10-CM | POA: Insufficient documentation

## 2015-03-28 DIAGNOSIS — R200 Anesthesia of skin: Secondary | ICD-10-CM | POA: Insufficient documentation

## 2015-03-28 DIAGNOSIS — I129 Hypertensive chronic kidney disease with stage 1 through stage 4 chronic kidney disease, or unspecified chronic kidney disease: Secondary | ICD-10-CM | POA: Diagnosis not present

## 2015-03-28 DIAGNOSIS — M797 Fibromyalgia: Secondary | ICD-10-CM | POA: Insufficient documentation

## 2015-03-28 DIAGNOSIS — Z79899 Other long term (current) drug therapy: Secondary | ICD-10-CM | POA: Diagnosis not present

## 2015-03-28 DIAGNOSIS — Z8639 Personal history of other endocrine, nutritional and metabolic disease: Secondary | ICD-10-CM | POA: Diagnosis not present

## 2015-03-28 DIAGNOSIS — Z8742 Personal history of other diseases of the female genital tract: Secondary | ICD-10-CM | POA: Insufficient documentation

## 2015-03-28 DIAGNOSIS — G8929 Other chronic pain: Secondary | ICD-10-CM | POA: Diagnosis not present

## 2015-03-28 DIAGNOSIS — G4700 Insomnia, unspecified: Secondary | ICD-10-CM | POA: Diagnosis not present

## 2015-03-28 DIAGNOSIS — Z9889 Other specified postprocedural states: Secondary | ICD-10-CM | POA: Insufficient documentation

## 2015-03-28 DIAGNOSIS — M1990 Unspecified osteoarthritis, unspecified site: Secondary | ICD-10-CM | POA: Diagnosis not present

## 2015-03-28 DIAGNOSIS — I471 Supraventricular tachycardia: Secondary | ICD-10-CM | POA: Insufficient documentation

## 2015-03-28 DIAGNOSIS — N182 Chronic kidney disease, stage 2 (mild): Secondary | ICD-10-CM | POA: Insufficient documentation

## 2015-03-28 DIAGNOSIS — Z7982 Long term (current) use of aspirin: Secondary | ICD-10-CM | POA: Insufficient documentation

## 2015-03-28 DIAGNOSIS — I252 Old myocardial infarction: Secondary | ICD-10-CM | POA: Insufficient documentation

## 2015-03-28 DIAGNOSIS — I2510 Atherosclerotic heart disease of native coronary artery without angina pectoris: Secondary | ICD-10-CM | POA: Diagnosis not present

## 2015-03-28 DIAGNOSIS — Z7902 Long term (current) use of antithrombotics/antiplatelets: Secondary | ICD-10-CM | POA: Insufficient documentation

## 2015-03-28 DIAGNOSIS — D509 Iron deficiency anemia, unspecified: Secondary | ICD-10-CM | POA: Insufficient documentation

## 2015-03-28 DIAGNOSIS — Z8719 Personal history of other diseases of the digestive system: Secondary | ICD-10-CM | POA: Diagnosis not present

## 2015-03-28 DIAGNOSIS — Z86010 Personal history of colonic polyps: Secondary | ICD-10-CM | POA: Diagnosis not present

## 2015-03-28 LAB — I-STAT TROPONIN, ED
TROPONIN I, POC: 0.01 ng/mL (ref 0.00–0.08)
Troponin i, poc: 0.01 ng/mL (ref 0.00–0.08)

## 2015-03-28 LAB — CBC
HEMATOCRIT: 36.2 % (ref 36.0–46.0)
HEMOGLOBIN: 11.7 g/dL — AB (ref 12.0–15.0)
MCH: 27.5 pg (ref 26.0–34.0)
MCHC: 32.3 g/dL (ref 30.0–36.0)
MCV: 85 fL (ref 78.0–100.0)
Platelets: 230 10*3/uL (ref 150–400)
RBC: 4.26 MIL/uL (ref 3.87–5.11)
RDW: 15 % (ref 11.5–15.5)
WBC: 6 10*3/uL (ref 4.0–10.5)

## 2015-03-28 LAB — BASIC METABOLIC PANEL
ANION GAP: 10 (ref 5–15)
BUN: 15 mg/dL (ref 6–20)
CALCIUM: 9.7 mg/dL (ref 8.9–10.3)
CO2: 24 mmol/L (ref 22–32)
Chloride: 103 mmol/L (ref 101–111)
Creatinine, Ser: 1.02 mg/dL — ABNORMAL HIGH (ref 0.44–1.00)
GFR calc Af Amer: 60 mL/min — ABNORMAL LOW (ref >60)
GFR calc non Af Amer: 52 mL/min — ABNORMAL LOW (ref >60)
GLUCOSE: 91 mg/dL (ref 65–99)
POTASSIUM: 4.6 mmol/L (ref 3.5–5.1)
Sodium: 137 mmol/L (ref 135–145)

## 2015-03-28 LAB — PROTIME-INR
INR: 1.05 (ref 0.00–1.49)
Prothrombin Time: 13.9 seconds (ref 11.6–15.2)

## 2015-03-28 NOTE — Discharge Instructions (Signed)

## 2015-03-28 NOTE — ED Notes (Signed)
Patient transported to CT 

## 2015-03-28 NOTE — ED Notes (Signed)
MD at bedside. 

## 2015-03-28 NOTE — Telephone Encounter (Signed)
New message      Pt c/o of Chest Pain: STAT if CP now or developed within 24 hours  1. Are you having CP right now? Not now but had it earlier today  2. Are you experiencing any other symptoms (ex. SOB, nausea, vomiting, sweating)?  lft arm is tingling 3. How long have you been experiencing CP?  Woke up last night with rt breast pain 4. Is your CP continuous or coming and going?  Comes and goes  5. Have you taken Nitroglycerin? no?

## 2015-03-28 NOTE — ED Notes (Signed)
Pt states she woke up last night around 4am with sharp left sided chest pain that came and went. Pt states about 2 hours ago she started having a tingling in her left arm and left leg. Pt states she is having no pain at this time, was sen at PCP and told to come to ED. Pt denies any sob. Pt has no neuro deficits. Pt is alert and ox4.

## 2015-03-28 NOTE — ED Provider Notes (Signed)
CSN: [id number]     Arrival date & time 03/28/15  1721 History   First MD Initiated Contact with Patient 03/28/15 2031     Chief Complaint  Patient presents with  . Chest Pain  . Numbness    HPI Pt had an episode of chest pain last night.   They woke her up from sleep but they resolved within minutes and she went back to sleep.  The sx started again at around 4pm.  It was a dull pain that lasted a second or so.  No shortness of breath.  NO nausea.  Since that happened she has had some tingling on the left arm and leg and it feels a little numb.  No trouble with weakness.  No trouble with speech or vision.  Pt has history of MI in June 2016.  At that time she had severe pain in her arm on the left side.  She is not having that now. Past Medical History  Diagnosis Date  . Dyslipidemia     a. statin intolerant.  . IRON DEFICIENCY ANEMIA SECONDARY TO BLOOD LOSS   . CAD (coronary artery disease) 2006, 2016    a. 08/2014 NSTEMI: Attempted PCI of 85% RPDA - unable to wire, complicated by dissection, case aborted;  b. 08/2014 Relook Cath: LM nl, LAD 60p, SP1 90ost (small), D1 65ost, RI 45/50, RCA nl, RPDA 90 - healed dissection;  b. 08/2014 Echo: EF 55%, mild/mon midapical inferior HK, mild AI, PASP .  [name] SUPRAVENTRICULAR TACHYCARDIA   . Raynaud's syndrome   . GERD   . GASTRIC ANTRAL VASCULAR ECTASIA   . ENDOMETRIOSIS   . SJOGREN'S SYNDROME   . Rosacea, acne   . COLONIC POLYPS, ADENOMATOUS, HX OF   . Essential hypertension   . Insomnia   . Sicca syndrome (HCC)   . Vitamin D deficiency   . CKD (chronic kidney disease), stage II   . Neuropathy (HCC)   . Diverticulitis   . Osteoarthritis     a. back/hips.  . Chronic back pain   . Fibromyalgia    Past Surgical History  Procedure Laterality Date  . Cholecystectomy    . Hiatal hernia repair  2005    and paraesophageal  . Esophagogastroduodenoscopy  11/27/2009    APC tretment of lesions  . Colonoscopy w/ biopsies and polypectomy   12/17/2006    adenomatous polyps, external hemorrhoids  . Tear duct surgery Right     "no plugs or anything"  . Breast lumpectomy with radioactive seed localization Right 01/24/2014    Procedure: BREAST LUMPECTOMY WITH RADIOACTIVE SEED LOCALIZATION;  Surgeon: Avel Peace, MD;  Location: [facility];  Service: General;  Laterality: Right;  . Hernia repair    . Appendectomy    . Breast biopsy Bilateral     "both were negative"  . Breast lumpectomy Bilateral   . Total abdominal hysterectomy      w/BSO  . Cardiac catheterization N/A 08/18/2014    Procedure: Left Heart Cath and Coronary Angiography;  Surgeon: Marykay Lex, MD;  Location: [facility] INVASIVE CV LAB;  Service: Cardiovascular;  Laterality: N/A;  . Cardiac catheterization  08/18/2014    Procedure: Coronary Balloon Angioplasty;  Surgeon: Marykay Lex, MD;  Location: [facility] INVASIVE CV LAB;  Service: Cardiovascular;;  . Cardiac catheterization N/A 08/21/2014    Procedure: Left Heart Cath and Coronary Angiography;  Surgeon: Marykay Lex, MD;  Location: [facility] INVASIVE CV LAB;  Service: Cardiovascular;  Laterality: N/A;  .  Cardiac catheterization  2006  . Eye surgery     Family History  Problem Relation Age of Onset  . Heart failure Mother   . Diabetes Mother   . Kidney failure Mother   . Hypertension Mother   . Coronary artery disease Mother   . Heart disease Mother   . Hyperlipidemia Mother   . Peripheral vascular disease Mother     amputation  . Breast cancer Sister     x 3 sisters  . Cancer Sister   . Heart disease Father   . Hypertension Father   . Coronary artery disease Father   . Heart attack Father   . Breast cancer Daughter   . Cancer Daughter   . Colon cancer Neg Hx   . Esophageal cancer Neg Hx   . Stomach cancer Neg Hx   . Rectal cancer Neg Hx   . Coronary artery disease Brother   . Heart disease Brother   . Coronary artery disease Brother   . Coronary artery disease Brother   . Breast cancer  Sister   . Breast cancer Sister    Social History  Substance Use Topics  . Smoking status: Never Smoker   . Smokeless tobacco: Never Used  . Alcohol Use: Yes     Comment: occasional// 1 drink a month   OB History    No data available     Review of Systems  All other systems reviewed and are negative.     Allergies  Ezetimibe; Ezetimibe-simvastatin; Morphine; Rosuvastatin; Ambien; Plaquenil; Polytrim; and Prevacid  Home Medications   Prior to Admission medications   Medication Sig Start Date End Date Taking? Authorizing Provider  acetaminophen (TYLENOL) 500 MG tablet Take 1,000 mg by mouth every 6 (six) hours as needed (pain).   Yes Historical Provider, MD  aspirin 81 MG tablet Take 81 mg by mouth daily.   Yes Historical Provider, MD  clopidogrel (PLAVIX) 75 MG tablet Take 1 tablet (75 mg total) by mouth daily with breakfast. 11/10/14  Yes Tonny Bollman, MD  cyclobenzaprine (FLEXERIL) 10 MG tablet Take 10 mg by mouth daily as needed for muscle spasms.   Yes Historical Provider, MD  ferrous sulfate 325 (65 FE) MG tablet Take 325 mg by mouth daily with breakfast.   Yes Historical Provider, MD  fluticasone (FLONASE) 50 MCG/ACT nasal spray Place 2 sprays into both nostrils daily as needed for allergies.    Yes Historical Provider, MD  isosorbide mononitrate (IMDUR) 30 MG 24 hr tablet Take 1 tablet (30 mg total) by mouth daily. 11/10/14  Yes Tonny Bollman, MD  metoprolol succinate (TOPROL-XL) 25 MG 24 hr tablet Take 0.5 tablets (12.5 mg total) by mouth daily. 08/21/14  Yes Luke K Kilroy, PA-C  nitroGLYCERIN (NITROSTAT) 0.4 MG SL tablet Place 1 tablet (0.4 mg total) under the tongue every 5 (five) minutes as needed for chest pain. 08/21/14  Yes Luke K Kilroy, PA-C  Polyvinyl Alcohol-Povidone (REFRESH OP) Place 1-3 drops into both eyes daily as needed (dry eyes).   Yes Historical Provider, MD  temazepam (RESTORIL) 15 MG capsule Take 15 mg by mouth at bedtime as needed for sleep. SLEEP AID  08/03/13  Yes Historical Provider, MD   BP 135/55 mmHg  Pulse 76  Temp(Src) 98.2 F (36.8 C) (Oral)  Resp 16  Ht  (1.626 m)  Wt 56.7 kg  BMI 21.45 kg/m2  SpO2 98% Physical Exam  Constitutional: She is oriented to person, place, and time. She appears well-developed  and well-nourished. No distress.  HENT:  Head: Normocephalic and atraumatic.  Right Ear: External ear normal.  Left Ear: External ear normal.  Mouth/Throat: Oropharynx is clear and moist.  Eyes: Conjunctivae are normal. Right eye exhibits no discharge. Left eye exhibits no discharge. No scleral icterus.  Neck: Neck supple. No tracheal deviation present.  No carotid bruit  Cardiovascular: Normal rate, regular rhythm and intact distal pulses.   Pulmonary/Chest: Effort normal and breath sounds normal. No stridor. No respiratory distress. She has no wheezes. She has no rales.  Abdominal: Soft. Bowel sounds are normal. She exhibits no distension. There is no tenderness. There is no rebound and no guarding.  Musculoskeletal: She exhibits no edema or tenderness.  Neurological: She is alert and oriented to person, place, and time. She has normal strength. No cranial nerve deficit (No facial droop, extraocular movements intact, tongue midline ) or sensory deficit. She exhibits normal muscle tone. She displays no seizure activity. Coordination normal.  No pronator drift bilateral upper extrem, able to hold both legs off bed for 5 seconds, sensation intact in all extremities, no visual field cuts, no left or right sided neglect, normal finger-nose exam bilaterally, no nystagmus noted   Skin: Skin is warm and dry. No rash noted.  Psychiatric: She has a normal mood and affect.  Nursing note and vitals reviewed.   ED Course  Procedures (including critical care time) Labs Review Labs Reviewed  BASIC METABOLIC PANEL - Abnormal; Notable for the following:    Creatinine, Ser 1.02 (*)    GFR calc non Af Amer 52 (*)    GFR calc Af  Amer 60 (*)    All other components within normal limits  CBC - Abnormal; Notable for the following:    Hemoglobin 11.7 (*)    All other components within normal limits  PROTIME-INR  I-STAT TROPOININ, ED  Rosezena Sensor, ED    Imaging Review Dg Chest 2 View  03/28/2015  CLINICAL DATA:  Chest pain. EXAM: CHEST  2 VIEW COMPARISON:  January 16, 2015. FINDINGS: The heart size and mediastinal contours are within normal limits. Both lungs are clear. No pneumothorax or pleural effusion is noted. The visualized skeletal structures are unremarkable. IMPRESSION: No active cardiopulmonary disease. Electronically Signed   By: Lupita Raider, M.D.   On: 03/28/2015 18:03   Ct Head Wo Contrast  03/28/2015  CLINICAL DATA:  Left facial tingling and numbness. EXAM: CT HEAD WITHOUT CONTRAST TECHNIQUE: Contiguous axial images were obtained from the base of the skull through the vertex without intravenous contrast. COMPARISON:  None. FINDINGS: Bony calvarium appears intact. Mild chronic ischemic white matter disease is noted. No mass effect or midline shift is noted. Ventricular size is within normal limits. There is no evidence of mass lesion, hemorrhage or acute infarction. IMPRESSION: Mild chronic ischemic white matter disease. No acute intracranial abnormality seen. Electronically Signed   By: Lupita Raider, M.D.   On: 03/28/2015 22:42   I have personally reviewed and evaluated these images and lab results as part of my medical decision-making.   EKG Interpretation   Date/Time:  Wednesday March 28 2015 17:27:31 EST Ventricular Rate:  73 PR Interval:  128 QRS Duration: 78 QT Interval:  400 QTC Calculation: 440 R Axis:   80 Text Interpretation:  Normal sinus rhythm Normal ECG No significant change  since last tracing Confirmed by Zareya Tuckett  MD-J, Taijah Macrae ([id number]) on 03/28/2015  9:06:02 PM      MDM   Final diagnoses:  Chest pain, unspecified chest pain type    Patient's symptoms are atypical for  coronary artery disease. She had just a few seconds to maybe a minute of discomfort over the last day. She's had no recurrent episodes in the emergency room. Her EKG is reassuring. She's had 2 sets of cardiac enzymes that are normal.  I think it's reasonable for the patient to follow-up with her cardiologist as an outpatient.  Patient describes some tingling and numbness on the left arm and left leg. This started with the chest discomfort. She has no carotid bruit, symptoms or other mild, I doubt carotid dissection. She has no focal neurologic deficits on her exam. CT scan is unremarkable.  I discussed with patient the option of being admitted to the hospital for further TIA stroke evaluation although my suspicion is low. Discussed possibility of close outpatient follow-up.  Patient prefer to go home. Considering her normal neurologic exam, and atypical presentation I think this is reasonable.    Linwood Dibbles, MD 03/28/15 [phone]

## 2015-03-28 NOTE — Telephone Encounter (Signed)
Reviewed chart and the pt is currently in the [facility] ER for evaluation per her PCP recommendation.

## 2015-03-28 NOTE — ED Notes (Signed)
Pt took two baby ASA around 4pm today

## 2015-03-29 NOTE — Telephone Encounter (Signed)
Follow up      Pt was at ER yesterday because of entire left side was numb. Everything checked out ok.  Pt saw PCP today----everything checked out ok.  At about 2pm, her left arm started hurting---no other symptoms.  She called her PCP and she told her to call Dr Excell Seltzer.  Please advise

## 2015-03-29 NOTE — Telephone Encounter (Signed)
I spoke with the pt and she complains of left upper arm and left leg heaviness, no extremity weakness.  The pt said she is also having difficulty seeing in her left eye and hearing in left ear.  The pt saw her PCP today and made her aware of these symptoms.  Per the pt the PCP did feel that her symptoms were neurologic related. I spoke with the pt about symptoms of stroke advised and that she seek immediate attention at ER if symptoms progress. The pt does not want to go back to the ER at this time. The pt has been seen by [facility] and I recommended that she contact them with her symptoms and arrange follow-up.  The pt agreed with plan.

## 2015-03-29 NOTE — Telephone Encounter (Signed)
Call transferred from the operator.  Pt with complaint of left arm pain.  Pt denies diaphoresis, shortness of breath or other symptoms.  Pt stated she had not taken an NTG. Encouraged pt to be sure she is drinking plenty of fluid, try some tylenol for discomfort.  Pt verbalized understanding. Julieta Gutting RN aware, she will call patient after clinic today, pt aware. Jim Like MHA RN CCM

## 2015-03-30 ENCOUNTER — Other Ambulatory Visit: Payer: Self-pay | Admitting: Family Medicine

## 2015-03-30 DIAGNOSIS — R200 Anesthesia of skin: Secondary | ICD-10-CM

## 2015-04-03 ENCOUNTER — Encounter: Payer: Self-pay | Admitting: Neurology

## 2015-04-03 ENCOUNTER — Ambulatory Visit (INDEPENDENT_AMBULATORY_CARE_PROVIDER_SITE_OTHER): Payer: Medicare Other | Admitting: Neurology

## 2015-04-03 VITALS — BP 121/58 | HR 63 | Ht 63.0 in | Wt 134.5 lb

## 2015-04-03 DIAGNOSIS — R202 Paresthesia of skin: Secondary | ICD-10-CM

## 2015-04-03 DIAGNOSIS — R42 Dizziness and giddiness: Secondary | ICD-10-CM

## 2015-04-03 NOTE — Progress Notes (Signed)
Reason for visit: Left hemisensory deficit  [name] is an 80 y.o. female  History of present illness:  [name] is a 80 year old right-handed white female with a history of intermittent episodes of left face, arm, and leg numbness that was evaluated in May 2016. The patient eventually underwent MRI of the brain in July 2016, no acute changes were seen. A carotid Doppler study was unremarkable. The patient remains on aspirin and Plavix. Two weeks ago, the patient went to the emergency room after onset of numbness of the left face, arm, and leg that came on very suddenly. The patient has a sensation of heaviness of the left arm, and the onset of the numbness occurred shortly after a brief episode of chest pain. The patient underwent a CT scan of the brain that was unremarkable. The patient has had persistence of symptoms since that time. The patient denies any speech change, she has some sensation of visual loss to the left and some hearing loss to the left since the numbness began. The patient denies any balance changes, she has no true weakness of the extremities. She is not having problems with slurring her speech or difficulty with swallowing. She denies headache or confusion. She comes to this office for an evaluation.  Past Medical History  Diagnosis Date  . Dyslipidemia     a. statin intolerant.  . IRON DEFICIENCY ANEMIA SECONDARY TO BLOOD LOSS   . CAD (coronary artery disease) 2006, 2016    a. 08/2014 NSTEMI: Attempted PCI of 85% RPDA - unable to wire, complicated by dissection, case aborted;  b. 08/2014 Relook Cath: LM nl, LAD 60p, SP1 90ost (small), D1 65ost, RI 45/50, RCA nl, RPDA 90 - healed dissection;  b. 08/2014 Echo: EF 55%, mild/mon midapical inferior HK, mild AI, PASP .  [name] SUPRAVENTRICULAR TACHYCARDIA   . Raynaud's syndrome   . GERD   . GASTRIC ANTRAL VASCULAR ECTASIA   . ENDOMETRIOSIS   . SJOGREN'S SYNDROME   . Rosacea, acne   . COLONIC POLYPS, ADENOMATOUS, HX  OF   . Essential hypertension   . Insomnia   . Sicca syndrome (HCC)   . Vitamin D deficiency   . CKD (chronic kidney disease), stage II   . Neuropathy (HCC)   . Diverticulitis   . Osteoarthritis     a. back/hips.  . Chronic back pain   . Fibromyalgia     Past Surgical History  Procedure Laterality Date  . Cholecystectomy    . Hiatal hernia repair  2005    and paraesophageal  . Esophagogastroduodenoscopy  11/27/2009    APC tretment of lesions  . Colonoscopy w/ biopsies and polypectomy  12/17/2006    adenomatous polyps, external hemorrhoids  . Tear duct surgery Right     "no plugs or anything"  . Breast lumpectomy with radioactive seed localization Right 01/24/2014    Procedure: BREAST LUMPECTOMY WITH RADIOACTIVE SEED LOCALIZATION;  Surgeon: Avel Peace, MD;  Location: [facility];  Service: General;  Laterality: Right;  . Hernia repair    . Appendectomy    . Breast biopsy Bilateral     "both were negative"  . Breast lumpectomy Bilateral   . Total abdominal hysterectomy      w/BSO  . Cardiac catheterization N/A 08/18/2014    Procedure: Left Heart Cath and Coronary Angiography;  Surgeon: Marykay Lex, MD;  Location: [facility] INVASIVE CV LAB;  Service: Cardiovascular;  Laterality: N/A;  . Cardiac catheterization  08/18/2014  Procedure: Coronary Balloon Angioplasty;  Surgeon: Marykay Lex, MD;  Location: [facility] INVASIVE CV LAB;  Service: Cardiovascular;;  . Cardiac catheterization N/A 08/21/2014    Procedure: Left Heart Cath and Coronary Angiography;  Surgeon: Marykay Lex, MD;  Location: [facility] INVASIVE CV LAB;  Service: Cardiovascular;  Laterality: N/A;  . Cardiac catheterization  2006  . Eye surgery      Family History  Problem Relation Age of Onset  . Heart failure Mother   . Diabetes Mother   . Kidney failure Mother   . Hypertension Mother   . Coronary artery disease Mother   . Heart disease Mother   . Hyperlipidemia Mother   . Peripheral vascular disease  Mother     amputation  . Breast cancer Sister     x 3 sisters  . Cancer Sister   . Heart disease Father   . Hypertension Father   . Coronary artery disease Father   . Heart attack Father   . Breast cancer Daughter   . Cancer Daughter   . Colon cancer Neg Hx   . Esophageal cancer Neg Hx   . Stomach cancer Neg Hx   . Rectal cancer Neg Hx   . Coronary artery disease Brother   . Heart disease Brother   . Coronary artery disease Brother   . Coronary artery disease Brother   . Breast cancer Sister   . Breast cancer Sister     Social history:  reports that she has never smoked. She has never used smokeless tobacco. She reports that she drinks alcohol. She reports that she does not use illicit drugs.    Allergies  Allergen Reactions  . Ezetimibe Other (See Comments)    Muscle pain= zetia  . Ezetimibe-Simvastatin Other (See Comments)    Muscle pain= vytorin  . Morphine Other (See Comments)    nightmares  . Rosuvastatin Other (See Comments)    Muscle pain  . Ambien [Zolpidem Tartrate] Other (See Comments)    Fell down  . Plaquenil [Hydroxychloroquine Sulfate] Other (See Comments)  . Polytrim [Polymyxin B-Trimethoprim] Other (See Comments)    unknown  . Prevacid [Lansoprazole] Other (See Comments)    dizziness    Medications:  Prior to Admission medications   Medication Sig Start Date End Date Taking? Authorizing Provider  acetaminophen (TYLENOL) 500 MG tablet Take 1,000 mg by mouth every 6 (six) hours as needed (pain).   Yes Historical Provider, MD  aspirin 81 MG tablet Take 81 mg by mouth daily.   Yes Historical Provider, MD  clopidogrel (PLAVIX) 75 MG tablet Take 1 tablet (75 mg total) by mouth daily with breakfast. 11/10/14  Yes Tonny Bollman, MD  cyclobenzaprine (FLEXERIL) 10 MG tablet Take 10 mg by mouth daily as needed for muscle spasms.   Yes Historical Provider, MD  ferrous sulfate 325 (65 FE) MG tablet Take 325 mg by mouth daily with breakfast.   Yes Historical  Provider, MD  fluticasone (FLONASE) 50 MCG/ACT nasal spray Place 2 sprays into both nostrils daily as needed for allergies.    Yes Historical Provider, MD  isosorbide mononitrate (IMDUR) 30 MG 24 hr tablet Take 1 tablet (30 mg total) by mouth daily. 11/10/14  Yes Tonny Bollman, MD  metoprolol succinate (TOPROL-XL) 25 MG 24 hr tablet Take 0.5 tablets (12.5 mg total) by mouth daily. 08/21/14  Yes Luke K Kilroy, PA-C  nitroGLYCERIN (NITROSTAT) 0.4 MG SL tablet Place 1 tablet (0.4 mg total) under the tongue every 5 (five) minutes  as needed for chest pain. 08/21/14  Yes Luke K Kilroy, PA-C  Polyvinyl Alcohol-Povidone (REFRESH OP) Place 1-3 drops into both eyes daily as needed (dry eyes).   Yes Historical Provider, MD  temazepam (RESTORIL) 15 MG capsule Take 15 mg by mouth at bedtime as needed for sleep. SLEEP AID 08/03/13  Yes Historical Provider, MD    ROS:  Out of a complete 14 system review of symptoms, the patient complains only of the following symptoms, and all other reviewed systems are negative.  Fatigue Hearing loss, visual loss Shortness of breath Cold intolerance Back pain Numbness Itching  Blood pressure 121/58, pulse 63, height  (1.6 m), weight 134 lb 8 oz (61.009 kg).  Physical Exam  General: The patient is alert and cooperative at the time of the examination.  Skin: No significant peripheral edema is noted.   Neurologic Exam  Mental status: The patient is alert and oriented x 3 at the time of the examination. The patient has apparent normal recent and remote memory, with an apparently normal attention span and concentration ability.   Cranial nerves: Facial symmetry is present. Speech is normal, no aphasia or dysarthria is noted. Extraocular movements are full. Visual fields are full.  Motor: The patient has good strength in all 4 extremities.  Sensory examination: Soft touch sensation is symmetric on the face, arms, and legs. Pinprick sensation is slightly decreased  on the left hand relative to the right, otherwise symmetric on the legs and face. Vibration sensation is relatively symmetric throughout.  Coordination: The patient has good finger-nose-finger and heel-to-shin bilaterally.  Gait and station: The patient has a normal gait. Tandem gait is normal. Romberg is negative. No drift is seen.  Reflexes: Deep tendon reflexes are symmetric.   Assessment/Plan:  1. Subjective sensory alteration, left body  The clinical examination is unremarkable. The patient has subjectively noted numbness of the left face, arm, and leg 2 weeks ago. The patient has normal strength, and gait and coordination is unremarkable. We will repeat MRI of the brain, the patient remained on antiplatelet agents currently.   Marlan Palau MD 04/03/2015 7:30 PM  [facility] [address] [place], [place] [address]  Phone [phone] Fax [phone]

## 2015-04-03 NOTE — Patient Instructions (Signed)
Paresthesia Paresthesia is an abnormal burning or prickling sensation. This sensation is generally felt in the hands, arms, legs, or feet. However, it may occur in any part of the body. Usually, it is not painful. The feeling may be described as:  Tingling or numbness.  Pins and needles.  Skin crawling.  Buzzing.  Limbs falling asleep.  Itching. Most people experience temporary (transient) paresthesia at some time in their lives. Paresthesia may occur when you breathe too quickly (hyperventilation). It can also occur without any apparent cause. Commonly, paresthesia occurs when pressure is placed on a nerve. The sensation quickly goes away after the pressure is removed. For some people, however, paresthesia is a long-lasting (chronic) condition that is caused by an underlying disorder. If you continue to have paresthesia, you may need further medical evaluation. HOME CARE INSTRUCTIONS Watch your condition for any changes. Taking the following actions may help to lessen any discomfort that you are feeling:  Avoid drinking alcohol.  Try acupuncture or massage to help relieve your symptoms.  Keep all follow-up visits as directed by your health care provider. This is important. SEEK MEDICAL CARE IF:  You continue to have episodes of paresthesia.  Your burning or prickling feeling gets worse when you walk.  You have pain, cramps, or dizziness.  You develop a rash. SEEK IMMEDIATE MEDICAL CARE IF:  You feel weak.  You have trouble walking or moving.  You have problems with speech, understanding, or vision.  You feel confused.  You cannot control your bladder or bowel movements.  You have numbness after an injury.  You faint.   This information is not intended to replace advice given to you by your health care provider. Make sure you discuss any questions you have with your health care provider.   Document Released: 02/21/2002 Document Revised: 07/18/2014 Document Reviewed:  02/27/2014 Elsevier Interactive Patient Education 2016 [employer]  

## 2015-04-08 ENCOUNTER — Emergency Department (HOSPITAL_COMMUNITY)
Admission: EM | Admit: 2015-04-08 | Discharge: 2015-04-08 | Disposition: A | Discharge status: Home / Self Care | Payer: Medicare Other | Attending: Emergency Medicine | Admitting: Emergency Medicine

## 2015-04-08 ENCOUNTER — Emergency Department (HOSPITAL_COMMUNITY): Payer: Medicare Other

## 2015-04-08 ENCOUNTER — Encounter (HOSPITAL_COMMUNITY): Payer: Self-pay | Admitting: Emergency Medicine

## 2015-04-08 DIAGNOSIS — I129 Hypertensive chronic kidney disease with stage 1 through stage 4 chronic kidney disease, or unspecified chronic kidney disease: Secondary | ICD-10-CM | POA: Diagnosis not present

## 2015-04-08 DIAGNOSIS — Z8742 Personal history of other diseases of the female genital tract: Secondary | ICD-10-CM | POA: Diagnosis not present

## 2015-04-08 DIAGNOSIS — I160 Hypertensive urgency: Secondary | ICD-10-CM | POA: Insufficient documentation

## 2015-04-08 DIAGNOSIS — Z86010 Personal history of colonic polyps: Secondary | ICD-10-CM | POA: Insufficient documentation

## 2015-04-08 DIAGNOSIS — R200 Anesthesia of skin: Secondary | ICD-10-CM | POA: Diagnosis present

## 2015-04-08 DIAGNOSIS — N182 Chronic kidney disease, stage 2 (mild): Secondary | ICD-10-CM | POA: Diagnosis not present

## 2015-04-08 DIAGNOSIS — Z7902 Long term (current) use of antithrombotics/antiplatelets: Secondary | ICD-10-CM | POA: Diagnosis not present

## 2015-04-08 DIAGNOSIS — Z79899 Other long term (current) drug therapy: Secondary | ICD-10-CM | POA: Insufficient documentation

## 2015-04-08 DIAGNOSIS — Z8639 Personal history of other endocrine, nutritional and metabolic disease: Secondary | ICD-10-CM | POA: Diagnosis not present

## 2015-04-08 DIAGNOSIS — I2510 Atherosclerotic heart disease of native coronary artery without angina pectoris: Secondary | ICD-10-CM | POA: Diagnosis not present

## 2015-04-08 DIAGNOSIS — R202 Paresthesia of skin: Secondary | ICD-10-CM | POA: Diagnosis not present

## 2015-04-08 DIAGNOSIS — G8929 Other chronic pain: Secondary | ICD-10-CM | POA: Diagnosis not present

## 2015-04-08 DIAGNOSIS — D500 Iron deficiency anemia secondary to blood loss (chronic): Secondary | ICD-10-CM | POA: Diagnosis not present

## 2015-04-08 DIAGNOSIS — Z8719 Personal history of other diseases of the digestive system: Secondary | ICD-10-CM | POA: Diagnosis not present

## 2015-04-08 DIAGNOSIS — M1990 Unspecified osteoarthritis, unspecified site: Secondary | ICD-10-CM | POA: Insufficient documentation

## 2015-04-08 DIAGNOSIS — I471 Supraventricular tachycardia: Secondary | ICD-10-CM | POA: Insufficient documentation

## 2015-04-08 DIAGNOSIS — Z7982 Long term (current) use of aspirin: Secondary | ICD-10-CM | POA: Insufficient documentation

## 2015-04-08 DIAGNOSIS — Z9889 Other specified postprocedural states: Secondary | ICD-10-CM | POA: Diagnosis not present

## 2015-04-08 DIAGNOSIS — Z872 Personal history of diseases of the skin and subcutaneous tissue: Secondary | ICD-10-CM | POA: Insufficient documentation

## 2015-04-08 DIAGNOSIS — G4700 Insomnia, unspecified: Secondary | ICD-10-CM | POA: Diagnosis not present

## 2015-04-08 LAB — CBC
HEMATOCRIT: 35.6 % — AB (ref 36.0–46.0)
HEMOGLOBIN: 11.9 g/dL — AB (ref 12.0–15.0)
MCH: 28.3 pg (ref 26.0–34.0)
MCHC: 33.4 g/dL (ref 30.0–36.0)
MCV: 84.8 fL (ref 78.0–100.0)
Platelets: 204 10*3/uL (ref 150–400)
RBC: 4.2 MIL/uL (ref 3.87–5.11)
RDW: 14.8 % (ref 11.5–15.5)
WBC: 6.7 10*3/uL (ref 4.0–10.5)

## 2015-04-08 LAB — BASIC METABOLIC PANEL
Anion gap: 10 (ref 5–15)
BUN: 13 mg/dL (ref 6–20)
CHLORIDE: 103 mmol/L (ref 101–111)
CO2: 22 mmol/L (ref 22–32)
CREATININE: 0.92 mg/dL (ref 0.44–1.00)
Calcium: 9.7 mg/dL (ref 8.9–10.3)
GFR calc Af Amer: >60 mL/min (ref >60)
GFR calc non Af Amer: 59 mL/min — ABNORMAL LOW (ref >60)
GLUCOSE: 96 mg/dL (ref 65–99)
Potassium: 4.5 mmol/L (ref 3.5–5.1)
SODIUM: 135 mmol/L (ref 135–145)

## 2015-04-08 LAB — I-STAT TROPONIN, ED: TROPONIN I, POC: 0 ng/mL (ref 0.00–0.08)

## 2015-04-08 NOTE — Discharge Instructions (Signed)
As discussed, your evaluation today has been largely reassuring.  But, it is important that you monitor your condition carefully, and do not hesitate to return to the ED if you develop new, or concerning changes in your condition. ? ?Otherwise, please follow-up with your physician for appropriate ongoing care. ? ?

## 2015-04-08 NOTE — ED Notes (Addendum)
C/o elevated BP at home (181/101) and L arm numbness since 4pm.  Pt has no neuro deficits on triage exam.  Also reports nausea and "very light" L sided chest discomfort that started while being triaged.  Dr. Jeraldine Loots at triage on arrival and no code stroke called.  Pt has history of paresthesia in LUE and states this feels the same.

## 2015-04-08 NOTE — ED Provider Notes (Signed)
CSN: [id number]     Arrival date & time 04/08/15  1617 History   First MD Initiated Contact with Patient 04/08/15 2005     Chief Complaint  Patient presents with  . Hypertension  . Numbness    HPI  I evaluated the patient briefly in the triage booth, due to nursing concern of possible stroke. At that point the patient confirmed that her left distal upper extremity was similar to multiple prior events. Patient was mildly hypertensive.  After the patient had initial triage, labs, she was transferred to a room for evaluation. At that point she clarified that earlier today she had a brief moment of noticeable indigestion, subsequently took her blood pressure, found the systolic to be elevated, beyond 180. There is no vomiting, no chest pain, no dyspnea. She states that about that time she became more aware of her left distal upper arm paresthesia She notes that she has had this sensation multiple times in the past, and today was similar, though with a sending sensation. No complete weakness, loss of sensation anywhere, no speech deficit, no confusion, disorientation. On my initial evaluation she states that her paresthesia is minimal, and she has no sensation of indigestion   Past Medical History  Diagnosis Date  . Dyslipidemia     a. statin intolerant.  . IRON DEFICIENCY ANEMIA SECONDARY TO BLOOD LOSS   . CAD (coronary artery disease) 2006, 2016    a. 08/2014 NSTEMI: Attempted PCI of 85% RPDA - unable to wire, complicated by dissection, case aborted;  b. 08/2014 Relook Cath: LM nl, LAD 60p, SP1 90ost (small), D1 65ost, RI 45/50, RCA nl, RPDA 90 - healed dissection;  b. 08/2014 Echo: EF 55%, mild/mon midapical inferior HK, mild AI, PASP .  [name] SUPRAVENTRICULAR TACHYCARDIA   . Raynaud's syndrome   . GERD   . GASTRIC ANTRAL VASCULAR ECTASIA   . ENDOMETRIOSIS   . SJOGREN'S SYNDROME   . Rosacea, acne   . COLONIC POLYPS, ADENOMATOUS, HX OF   . Essential hypertension   . Insomnia   .  Sicca syndrome (HCC)   . Vitamin D deficiency   . CKD (chronic kidney disease), stage II   . Neuropathy (HCC)   . Diverticulitis   . Osteoarthritis     a. back/hips.  . Chronic back pain   . Fibromyalgia    Past Surgical History  Procedure Laterality Date  . Cholecystectomy    . Hiatal hernia repair  2005    and paraesophageal  . Esophagogastroduodenoscopy  11/27/2009    APC tretment of lesions  . Colonoscopy w/ biopsies and polypectomy  12/17/2006    adenomatous polyps, external hemorrhoids  . Tear duct surgery Right     "no plugs or anything"  . Breast lumpectomy with radioactive seed localization Right 01/24/2014    Procedure: BREAST LUMPECTOMY WITH RADIOACTIVE SEED LOCALIZATION;  Surgeon: Avel Peace, MD;  Location: [facility];  Service: General;  Laterality: Right;  . Hernia repair    . Appendectomy    . Breast biopsy Bilateral     "both were negative"  . Breast lumpectomy Bilateral   . Total abdominal hysterectomy      w/BSO  . Cardiac catheterization N/A 08/18/2014    Procedure: Left Heart Cath and Coronary Angiography;  Surgeon: Marykay Lex, MD;  Location: [facility] INVASIVE CV LAB;  Service: Cardiovascular;  Laterality: N/A;  . Cardiac catheterization  08/18/2014    Procedure: Coronary Balloon Angioplasty;  Surgeon: Marykay Lex, MD;  Location: [facility] INVASIVE CV LAB;  Service: Cardiovascular;;  . Cardiac catheterization N/A 08/21/2014    Procedure: Left Heart Cath and Coronary Angiography;  Surgeon: Marykay Lex, MD;  Location: [facility] INVASIVE CV LAB;  Service: Cardiovascular;  Laterality: N/A;  . Cardiac catheterization  2006  . Eye surgery     Family History  Problem Relation Age of Onset  . Heart failure Mother   . Diabetes Mother   . Kidney failure Mother   . Hypertension Mother   . Coronary artery disease Mother   . Heart disease Mother   . Hyperlipidemia Mother   . Peripheral vascular disease Mother     amputation  . Breast cancer Sister     x  3 sisters  . Cancer Sister   . Heart disease Father   . Hypertension Father   . Coronary artery disease Father   . Heart attack Father   . Breast cancer Daughter   . Cancer Daughter   . Colon cancer Neg Hx   . Esophageal cancer Neg Hx   . Stomach cancer Neg Hx   . Rectal cancer Neg Hx   . Coronary artery disease Brother   . Heart disease Brother   . Coronary artery disease Brother   . Coronary artery disease Brother   . Breast cancer Sister   . Breast cancer Sister    Social History  Substance Use Topics  . Smoking status: Never Smoker   . Smokeless tobacco: Never Used  . Alcohol Use: Yes     Comment: occasional// 1 drink a month   OB History    No data available     Review of Systems  Constitutional:       Per HPI, otherwise negative  HENT:       Per HPI, otherwise negative  Respiratory:       Per HPI, otherwise negative  Cardiovascular:       Per HPI, otherwise negative  Gastrointestinal: Negative for vomiting.  Endocrine:       Negative aside from HPI  Genitourinary:       Neg aside from HPI   Musculoskeletal:       Per HPI, otherwise negative  Skin: Negative.   Neurological: Positive for numbness. Negative for syncope.      Allergies  Ezetimibe; Ezetimibe-simvastatin; Morphine; Rosuvastatin; Ambien; Plaquenil; Polytrim; and Prevacid  Home Medications   Prior to Admission medications   Medication Sig Start Date End Date Taking? Authorizing Provider  acetaminophen (TYLENOL) 500 MG tablet Take 1,000 mg by mouth every 6 (six) hours as needed (pain).   Yes Historical Provider, MD  aspirin 81 MG tablet Take 81 mg by mouth daily.   Yes Historical Provider, MD  clopidogrel (PLAVIX) 75 MG tablet Take 1 tablet (75 mg total) by mouth daily with breakfast. 11/10/14  Yes Tonny Bollman, MD  ferrous sulfate 325 (65 FE) MG tablet Take 325 mg by mouth daily with breakfast.   Yes Historical Provider, MD  isosorbide mononitrate (IMDUR) 30 MG 24 hr tablet Take 1 tablet  (30 mg total) by mouth daily. 11/10/14  Yes Tonny Bollman, MD  metoprolol succinate (TOPROL-XL) 25 MG 24 hr tablet Take 0.5 tablets (12.5 mg total) by mouth daily. 08/21/14  Yes Luke K Kilroy, PA-C  nitroGLYCERIN (NITROSTAT) 0.4 MG SL tablet Place 1 tablet (0.4 mg total) under the tongue every 5 (five) minutes as needed for chest pain. 08/21/14  Yes Luke K Kilroy, PA-C  Pseudoeph-Doxylamine-DM-APAP (NYQUIL MULTI-SYMPTOM PO) Take  1 capsule by mouth at bedtime as needed (for cold).   Yes Historical Provider, MD  temazepam (RESTORIL) 15 MG capsule Take 15 mg by mouth at bedtime as needed for sleep. SLEEP AID 08/03/13  Yes Historical Provider, MD   BP 141/56 mmHg  Pulse 63  Temp(Src) 98.1 F (36.7 C) (Oral)  Resp 13  Ht 5\' 3"  (1.6 m)  Wt 131 lb (59.421 kg)  BMI 23.21 kg/m2  SpO2 99% Physical Exam  Constitutional: She is oriented to person, place, and time. She appears well-developed and well-nourished. No distress.  HENT:  Head: Normocephalic and atraumatic.  Eyes: Conjunctivae and EOM are normal.  Cardiovascular: Normal rate and regular rhythm.   Pulmonary/Chest: Effort normal and breath sounds normal. No stridor. No respiratory distress.  Abdominal: She exhibits no distension.  Musculoskeletal: She exhibits no edema.  Neurological: She is alert and oriented to person, place, and time. She displays no atrophy and no tremor. No cranial nerve deficit or sensory deficit. She exhibits normal muscle tone. She displays no seizure activity. Coordination normal.  Skin: Skin is warm and dry.  Psychiatric: She has a normal mood and affect.  Nursing note and vitals reviewed.   ED Course  Procedures (including critical care time) Labs Review Labs Reviewed  BASIC METABOLIC PANEL - Abnormal; Notable for the following:    GFR calc non Af Amer 59 (*)    All other components within normal limits  CBC - Abnormal; Notable for the following:    Hemoglobin 11.9 (*)    HCT 35.6 (*)    All other components  within normal limits  I-STAT TROPOININ, ED    Imaging Review Dg Chest 2 View  04/08/2015  CLINICAL DATA:  Chest pain and nausea since 4 p.m. today. EXAM: CHEST  2 VIEW COMPARISON:  03/28/2015 FINDINGS: The cardiac silhouette, mediastinal and hilar contours are within normal limits and stable. Stable tortuosity and calcification of the thoracic aorta. The lungs are clear acute process. No pleural effusion. The bony thorax is intact. IMPRESSION: No acute cardiopulmonary findings. Electronically Signed   By: Rudie Meyer M.D.   On: 04/08/2015 18:09   I have personally reviewed and evaluated these images and lab results as part of my medical decision-making.   EKG Interpretation   Date/Time:  Sunday April 08 2015 16:59:55 EST Ventricular Rate:  67 PR Interval:  134 QRS Duration: 80 QT Interval:  414 QTC Calculation: 437 R Axis:   79 Text Interpretation:  Normal sinus rhythm Normal ECG Sinus rhythm Normal  ECG Confirmed by Gerhard Munch  MD ([id number]) on 04/08/2015 5:05:05 PM     9:00 PM Patient is asymptomatic Blood pressure 141/56 Chart review notable for evaluation here 11 days ago, with reassuring results. Over, the patient has also had a cardiac catheterization within the past year, echocardiogram within the past year, MRI.   Conclusion     1. Severe focal stenosis of the proximal RPDA with moderate to Severe disease in the proximal LAD. 2. RPDA lesion, 85% stenosed. After attempted PTCA with wire related dissection, there is a 99% residual stenosis post intervention attempt but with TIMI 2 flow and no anginal pain. 3. Unsuccessful Attempted PTCA of rPDA with resulting Dissection but with restored flow after wire removal - TIMI 2 flow. 4. Prox LAD lesion, ~60% stenosed (somewhat hazy shelflike lesion at the takeoff of a First Diagonal with a 60% ostial stenosis and major septal perforator trunk that has a 90% ostial stenosis) 5. Large-caliber ramus  intermedius that courses as a  large OM branch with moderate diffuse disease. 6. Preserved/low normal EF roughly 50-55% with apical hypo-kinesis/akinesis. 7. Normal LVEDP   Assessment/Plan:  1. Subjective sensory alteration, left body  The clinical examination is unremarkable. The patient has subjectively noted numbness of the left face, arm, and leg 2 weeks ago. The patient has normal strength, and gait and coordination is unremarkable. We will repeat MRI of the brain, the patient remained on antiplatelet agents currently.   Marlan Palau MD 04/03/2015 7:30 PM   10:08 PM Blood pressure 121/44. I discussed all findings again with the patient, her husband, and their son, via telephone. He is a physician.  MDM   Final diagnoses:  Hypertensive urgency   Patient presents after an episode of generalized discomfort, hypertension. Here, the patient is objectively neurologically intact, she is awake, alert, in no distress. Patient is history of episodic hypertensive episodes, as well as paresthesia in the left arm. Patient has been seen by neurology, cardiology, primary care. Multiple studies over the past year have been reassuring, and today's studies are similarly reassuring. With no ongoing symptoms, normalized blood pressure, no evidence for end organ damage, the patient was discharged to follow-up as an outpatient.  Gerhard Munch, MD 04/08/15 2212

## 2015-04-16 ENCOUNTER — Telehealth: Payer: Self-pay | Admitting: Neurology

## 2015-04-16 ENCOUNTER — Other Ambulatory Visit: Payer: Self-pay

## 2015-04-16 ENCOUNTER — Ambulatory Visit
Admission: RE | Admit: 2015-04-16 | Discharge: 2015-04-16 | Disposition: A | Discharge status: Home / Self Care | Payer: Medicare Other | Source: Ambulatory Visit | Attending: Neurology | Admitting: Neurology

## 2015-04-16 ENCOUNTER — Telehealth: Payer: Self-pay | Admitting: Cardiovascular Disease

## 2015-04-16 DIAGNOSIS — R202 Paresthesia of skin: Secondary | ICD-10-CM

## 2015-04-16 DIAGNOSIS — R42 Dizziness and giddiness: Secondary | ICD-10-CM

## 2015-04-16 DIAGNOSIS — N644 Mastodynia: Secondary | ICD-10-CM

## 2015-04-16 NOTE — Telephone Encounter (Signed)
I spoke with the pt and made her aware that she is okay to have MRI performed from a cardiac stand point.

## 2015-04-16 NOTE — Telephone Encounter (Signed)
New message  Pt is having an MRI done today and she wants to speak to rn  To make sure that their is nothing foreign in her body that would make complications

## 2015-04-16 NOTE — Telephone Encounter (Signed)
  I called patient. MRI the brain is stable, the most recent event of arm numbness and weakness has not resulted in any cerebrovascular event that can be documented by MRI.  MRI brain 04/16/15:  IMPRESSION: This MRI of the brain without contrast shows the following: 1. Widespread T2/FLAIR hyperintense foci in the cerebral hemispheres. There are also a few foci in the cerebellum and left thalamus. Foci are consistent with chronic microvascular ischemic change, more than expected for age. None of these appear to be acute. When compared to the MRI dated 09/15/2014, there is no interval change. 2. The cerebellopontine angles and the auditory canals appear normal.  3. There are no acute findings.

## 2015-05-03 ENCOUNTER — Ambulatory Visit (INDEPENDENT_AMBULATORY_CARE_PROVIDER_SITE_OTHER): Payer: Medicare Other | Admitting: Cardiovascular Disease

## 2015-05-03 ENCOUNTER — Encounter: Payer: Self-pay | Admitting: Cardiovascular Disease

## 2015-05-03 VITALS — BP 140/74 | HR 70 | Ht 63.0 in | Wt 134.4 lb

## 2015-05-03 DIAGNOSIS — R0600 Dyspnea, unspecified: Secondary | ICD-10-CM | POA: Diagnosis not present

## 2015-05-03 DIAGNOSIS — I2510 Atherosclerotic heart disease of native coronary artery without angina pectoris: Secondary | ICD-10-CM

## 2015-05-03 NOTE — Progress Notes (Signed)
Cardiology Office Note Date:  05/04/2015   ID:  [name], DOB 08/18/1935, MRN [id number]  PCP:  Cala Bradford, MD  Cardiologist:  Tonny Bollman, MD    Chief Complaint  Patient presents with  . Follow-up    pt c/o lack of energy and SOB  . Coronary Artery Disease     History of Present Illness: [name] is a 80 y.o. female who presents for follow-up of CAD. She presented with NSTEMI in 2016 and underwent diagnostic catheterization revealing severe disease in the distal posterior descending artery. An attempt was made at wiring this lesion however this resulted in a dissection and the case was aborted. She was previously noted to have a 70% stenosis in that location at the time of cardiac catheterization in 2006. She was maintained on anticoagulant therapy and subsequently underwent relook diagnostic catheterization 3 days later which continued to show a 90% stenosis in the PDA with a healed area of dissection. Medical therapy was recommended. She was maintained on aspirin, Plavix, beta blocker, and long-acting nitrate therapy. She is intolerant to statins and Zetia.  She still has issues with exercise intolerance, shortness of breath with low-level activity, and marked fatigue. Concerned about progressive symptoms, difficulty doing household work. No edema, orthopnea, or PND. No recent medication changes.   Past Medical History  Diagnosis Date  . Dyslipidemia     a. statin intolerant.  . IRON DEFICIENCY ANEMIA SECONDARY TO BLOOD LOSS   . CAD (coronary artery disease) 2006, 2016    a. 08/2014 NSTEMI: Attempted PCI of 85% RPDA - unable to wire, complicated by dissection, case aborted;  b. 08/2014 Relook Cath: LM nl, LAD 60p, SP1 90ost (small), D1 65ost, RI 45/50, RCA nl, RPDA 90 - healed dissection;  b. 08/2014 Echo: EF 55%, mild/mon midapical inferior HK, mild AI, PASP .  [name] SUPRAVENTRICULAR TACHYCARDIA   . Raynaud's syndrome   . GERD   . GASTRIC ANTRAL VASCULAR ECTASIA    . ENDOMETRIOSIS   . SJOGREN'S SYNDROME   . Rosacea, acne   . COLONIC POLYPS, ADENOMATOUS, HX OF   . Essential hypertension   . Insomnia   . Sicca syndrome (HCC)   . Vitamin D deficiency   . CKD (chronic kidney disease), stage II   . Neuropathy (HCC)   . Diverticulitis   . Osteoarthritis     a. back/hips.  . Chronic back pain   . Fibromyalgia     Past Surgical History  Procedure Laterality Date  . Cholecystectomy    . Hiatal hernia repair  2005    and paraesophageal  . Esophagogastroduodenoscopy  11/27/2009    APC tretment of lesions  . Colonoscopy w/ biopsies and polypectomy  12/17/2006    adenomatous polyps, external hemorrhoids  . Tear duct surgery Right     "no plugs or anything"  . Breast lumpectomy with radioactive seed localization Right 01/24/2014    Procedure: BREAST LUMPECTOMY WITH RADIOACTIVE SEED LOCALIZATION;  Surgeon: Avel Peace, MD;  Location: [facility];  Service: General;  Laterality: Right;  . Hernia repair    . Appendectomy    . Breast biopsy Bilateral     "both were negative"  . Breast lumpectomy Bilateral   . Total abdominal hysterectomy      w/BSO  . Cardiac catheterization N/A 08/18/2014    Procedure: Left Heart Cath and Coronary Angiography;  Surgeon: Marykay Lex, MD;  Location: [facility] INVASIVE CV LAB;  Service: Cardiovascular;  Laterality: N/A;  .  Cardiac catheterization  08/18/2014    Procedure: Coronary Balloon Angioplasty;  Surgeon: Marykay Lex, MD;  Location: [facility] INVASIVE CV LAB;  Service: Cardiovascular;;  . Cardiac catheterization N/A 08/21/2014    Procedure: Left Heart Cath and Coronary Angiography;  Surgeon: Marykay Lex, MD;  Location: [facility] INVASIVE CV LAB;  Service: Cardiovascular;  Laterality: N/A;  . Cardiac catheterization  2006  . Eye surgery      Current Outpatient Prescriptions  Medication Sig Dispense Refill  . acetaminophen (TYLENOL) 500 MG tablet Take 1,000 mg by mouth every 6 (six) hours as needed (pain).     [name] aspirin 81 MG tablet Take 81 mg by mouth daily.    . clopidogrel (PLAVIX) 75 MG tablet Take 1 tablet (75 mg total) by mouth daily with breakfast. 90 tablet 3  . ferrous sulfate 325 (65 FE) MG tablet Take 325 mg by mouth daily with breakfast.    . isosorbide mononitrate (IMDUR) 30 MG 24 hr tablet Take 1 tablet (30 mg total) by mouth daily. 90 tablet 3  . metoprolol succinate (TOPROL-XL) 25 MG 24 hr tablet Take 0.5 tablets (12.5 mg total) by mouth daily. 30 tablet 5  . nitroGLYCERIN (NITROSTAT) 0.4 MG SL tablet Place 1 tablet (0.4 mg total) under the tongue every 5 (five) minutes as needed for chest pain. 25 tablet 2  . Pseudoeph-Doxylamine-DM-APAP (NYQUIL MULTI-SYMPTOM PO) Take 1 capsule by mouth at bedtime as needed (for cold).    . temazepam (RESTORIL) 15 MG capsule Take 15 mg by mouth at bedtime as needed for sleep. SLEEP AID     No current facility-administered medications for this visit.    Allergies:   Ezetimibe; Ezetimibe-simvastatin; Morphine; Rosuvastatin; Ambien; Plaquenil; Polytrim; and Prevacid   Social History:  The patient  reports that she has never smoked. She has never used smokeless tobacco. She reports that she drinks alcohol. She reports that she does not use illicit drugs.   Family History:  The patient's family history includes Breast cancer in her daughter, sister, sister, and sister; Cancer in her daughter and sister; Coronary artery disease in her brother, brother, brother, father, and mother; Diabetes in her mother; Heart attack in her father; Heart disease in her brother, father, and mother; Heart failure in her mother; Hyperlipidemia in her mother; Hypertension in her father and mother; Kidney failure in her mother; Peripheral vascular disease in her mother. There is no history of Colon cancer, Esophageal cancer, Stomach cancer, or Rectal cancer.    ROS:  Please see the history of present illness.  Otherwise, review of systems is positive for depression, arm  numbness.  All other systems are reviewed and negative.    PHYSICAL EXAM: VS:  BP 140/74 mmHg  Pulse 70  Ht 5\' 3"  (1.6 m)  Wt 60.963 kg (134 lb 6.4 oz)  BMI 23.81 kg/m2 , BMI Body mass index is 23.81 kg/(m^2). GEN: Well nourished, well developed, in no acute distress HEENT: normal Neck: no JVD, no masses. No carotid bruits Cardiac: RRR without murmur or gallop                Respiratory:  clear to auscultation bilaterally, normal work of breathing GI: soft, nontender, nondistended, + BS MS: no deformity or atrophy Ext: no pretibial edema, pedal pulses 2+= bilaterally Skin: warm and dry, no rash Neuro:  Strength and sensation are intact Psych: euthymic mood, full affect  EKG:  EKG is not ordered today. EKG reviewed from 04/18/15: within normal limits  Recent  Labs: 08/18/2014: TSH 1.629 04/08/2015: BUN 13; Creatinine, Ser 0.92; Hemoglobin 11.9*; Platelets 204; Potassium 4.5; Sodium 135   Lipid Panel     Component Value Date/Time   CHOL 162 08/19/2014 0255   TRIG 98 08/19/2014 0255   HDL 54 08/19/2014 0255   CHOLHDL 3.0 08/19/2014 0255   VLDL 20 08/19/2014 0255   LDLCALC 88 08/19/2014 0255      Wt Readings from Last 3 Encounters:  05/03/15 60.963 kg (134 lb 6.4 oz)  04/08/15 59.421 kg (131 lb)  04/03/15 61.009 kg (134 lb 8 oz)     Cardiac Studies Reviewed: Echo 08/18/2014 Study Conclusions  - Left ventricle: There is mild/moderate hypokinesis of the mid/apical inferior segments. The cavity size was normal. Wall thickness was normal. The estimated ejection fraction was 55%. - Aortic valve: Sclerosis without stenosis. There was mild regurgitation. - Right ventricle: The cavity size was normal. Systolic function was normal. - Pulmonary arteries: PA peak pressure: 43 mm Hg (S).  Cardiac Cath 08/18/2014: Conclusion    1. Severe focal stenosis of the proximal RPDA with moderate to Severe disease in the proximal LAD. 2. RPDA lesion, 85% stenosed. After attempted  PTCA with wire related dissection, there is a 99% residual stenosis post intervention attempt but with TIMI 2 flow and no anginal pain. 3. Unsuccessful Attempted PTCA of rPDA with resulting Dissection but with restored flow after wire removal - TIMI 2 flow. 4. Prox LAD lesion, ~60% stenosed (somewhat hazy shelflike lesion at the takeoff of a First Diagonal with a 60% ostial stenosis and major septal perforator trunk that has a 90% ostial stenosis) 5. Large-caliber ramus intermedius that courses as a large OM branch with moderate diffuse disease. 6. Preserved/low normal EF roughly 50-55% with apical hypo-kinesis/akinesis. 7. Normal LVEDP  Despite presence of the extensive dissection in the PDA with only TIMI 2 flow, the patient remained angina (left arm pain) free and hemodynamically stable throughout the entire procedure despite dissection and poor flow down the PDA.           ASSESSMENT AND PLAN: 1.  CAD, native vessel: progressive dyspnea. Last cath reviewed. Pt known to have dissection of right PDA and moderate proximal LAD stenosis. Recommended exercise Myoview stress test for further risk stratification. Will also check an echocardiogram to evaluate shortness of breath.  2. Hyperlipidemia: multiple drug intolerances. Continue with lifestyle modification.   Current medicines are reviewed with the patient today.  The patient does not have concerns regarding medicines.  Labs/ tests ordered today include:   Orders Placed This Encounter  Procedures  . Myocardial Perfusion Imaging  . Echocardiogram   Signed, Tonny Bollman, MD  05/04/2015 5:37 PM    [facility] [address] [place], [place], [place]  [address] Phone: [phone]; Fax: [phone]

## 2015-05-03 NOTE — Patient Instructions (Signed)
Medication Instructions:  Your physician recommends that you continue on your current medications as directed. Please refer to the Current Medication list given to you today.  Labwork: No new orders.   Testing/Procedures: Your physician has requested that you have an echocardiogram. Echocardiography is a painless test that uses sound waves to create images of your heart. It provides your doctor with information about the size and shape of your heart and how well your heart's chambers and valves are working. This procedure takes approximately one hour. There are no restrictions for this procedure.  Your physician has requested that you have an exercise stress myoview. For further information please visit [email or web address]. Please follow instruction sheet, as given.  Follow-Up: Your physician wants you to follow-up in: 6 MONTHS with Dr Cooper. You will receive a reminder letter in the mail two months in advance. If you don't receive a letter, please call our office to schedule the follow-up appointment.   Any Other Special Instructions Will Be Listed Below (If Applicable).     If you need a refill on your cardiac medications before your next appointment, please call your pharmacy.   

## 2015-05-15 ENCOUNTER — Telehealth (HOSPITAL_COMMUNITY): Payer: Self-pay | Admitting: *Deleted

## 2015-05-15 NOTE — Telephone Encounter (Signed)
Left message on voicemail in reference to upcoming appointment scheduled for 05/16/15. Phone number given for a call back so details instructions can be given. Whitley Patchen J Plato Alspaugh, RN 

## 2015-05-16 ENCOUNTER — Encounter (HOSPITAL_COMMUNITY): Payer: Medicare Other

## 2015-05-17 ENCOUNTER — Other Ambulatory Visit: Payer: Self-pay

## 2015-05-17 ENCOUNTER — Ambulatory Visit (HOSPITAL_BASED_OUTPATIENT_CLINIC_OR_DEPARTMENT_OTHER): Payer: Medicare Other

## 2015-05-17 ENCOUNTER — Ambulatory Visit (HOSPITAL_COMMUNITY): Payer: Medicare Other | Attending: Cardiovascular Disease

## 2015-05-17 DIAGNOSIS — I352 Nonrheumatic aortic (valve) stenosis with insufficiency: Secondary | ICD-10-CM | POA: Insufficient documentation

## 2015-05-17 DIAGNOSIS — R0600 Dyspnea, unspecified: Secondary | ICD-10-CM

## 2015-05-17 DIAGNOSIS — I10 Essential (primary) hypertension: Secondary | ICD-10-CM | POA: Diagnosis not present

## 2015-05-17 DIAGNOSIS — I2510 Atherosclerotic heart disease of native coronary artery without angina pectoris: Secondary | ICD-10-CM

## 2015-05-17 DIAGNOSIS — E785 Hyperlipidemia, unspecified: Secondary | ICD-10-CM | POA: Diagnosis not present

## 2015-05-17 DIAGNOSIS — R0602 Shortness of breath: Secondary | ICD-10-CM | POA: Diagnosis not present

## 2015-05-17 DIAGNOSIS — I059 Rheumatic mitral valve disease, unspecified: Secondary | ICD-10-CM | POA: Insufficient documentation

## 2015-05-17 DIAGNOSIS — R9439 Abnormal result of other cardiovascular function study: Secondary | ICD-10-CM | POA: Diagnosis not present

## 2015-05-17 DIAGNOSIS — R079 Chest pain, unspecified: Secondary | ICD-10-CM | POA: Diagnosis not present

## 2015-05-17 LAB — MYOCARDIAL PERFUSION IMAGING
CHL CUP NUCLEAR SSS: 14
CSEPED: 4 min
CSEPEDS: 46 s
CSEPPHR: 133 {beats}/min
Estimated workload: 5 METS
LVDIAVOL: 94 mL
LVSYSVOL: 43 mL
MPHR: 143 {beats}/min
NUC STRESS TID: 0.91
Percent HR: 93 %
RATE: 0.38
Rest HR: 54 {beats}/min
SDS: 7
SRS: 7

## 2015-05-17 MED ORDER — TECHNETIUM TC 99M SESTAMIBI GENERIC - CARDIOLITE
32.4000 | Freq: Once | INTRAVENOUS | Status: AC | PRN
  Administered 2015-05-17: 32 via INTRAVENOUS

## 2015-05-17 MED ORDER — TECHNETIUM TC 99M SESTAMIBI GENERIC - CARDIOLITE
10.8000 | Freq: Once | INTRAVENOUS | Status: AC | PRN
  Administered 2015-05-17: 11 via INTRAVENOUS

## 2015-05-18 ENCOUNTER — Telehealth: Payer: Self-pay | Admitting: Cardiovascular Disease

## 2015-05-18 NOTE — Telephone Encounter (Signed)
Pt is calling again,she wants her stress and echo results from yesterday.

## 2015-05-18 NOTE — Telephone Encounter (Signed)
Reviewed stress test results. High risk nuclear scan with multi-territory ischemia. She is symptomatic and I have recommended that we proceed with cardiac catheterization and possible PCI. She understands the risks, indications, and alternatives to this approach. She underwent 2 heart catheterizations just last year. Advised that we will do the procedure next Thursday and that Lauren will call her with instructions.  Tonny Bollman 05/18/2015 4:56 PM

## 2015-05-18 NOTE — Telephone Encounter (Signed)
Pt said to please call her on her cell phone.

## 2015-05-18 NOTE — Telephone Encounter (Signed)
I spoke with the pt and made her aware of cardiac cath scheduled on 05/24/15.  Pre-procedure instructions given by phone. Staff message sent to pre-cert.

## 2015-05-18 NOTE — Telephone Encounter (Signed)
Pt would like her echo results from yesterday please. °

## 2015-05-21 ENCOUNTER — Other Ambulatory Visit: Payer: Self-pay | Admitting: Rheumatology

## 2015-05-21 ENCOUNTER — Ambulatory Visit
Admission: RE | Admit: 2015-05-21 | Discharge: 2015-05-21 | Disposition: A | Discharge status: Home / Self Care | Payer: Medicare Other | Source: Ambulatory Visit | Attending: Rheumatology | Admitting: Rheumatology

## 2015-05-21 DIAGNOSIS — R0602 Shortness of breath: Secondary | ICD-10-CM

## 2015-05-22 ENCOUNTER — Other Ambulatory Visit: Payer: Self-pay | Admitting: Cardiovascular Disease

## 2015-05-23 ENCOUNTER — Telehealth: Payer: Self-pay | Admitting: Cardiovascular Disease

## 2015-05-23 NOTE — Telephone Encounter (Signed)
I spoke with the pt and reviewed pre-cath instructions again by phone. All questions answered at this time.

## 2015-05-23 NOTE — Telephone Encounter (Signed)
Please call,she could not understand instructions for her Cath tomorrow please.

## 2015-05-24 ENCOUNTER — Encounter (HOSPITAL_COMMUNITY): Payer: Self-pay | Admitting: Cardiovascular Disease

## 2015-05-24 ENCOUNTER — Ambulatory Visit (HOSPITAL_COMMUNITY)
Admission: RE | Admit: 2015-05-24 | Discharge: 2015-05-25 | Disposition: A | Discharge status: Home / Self Care | Payer: Medicare Other | Source: Ambulatory Visit | Attending: Cardiovascular Disease | Admitting: Cardiovascular Disease

## 2015-05-24 ENCOUNTER — Encounter (HOSPITAL_COMMUNITY)
Admission: RE | Disposition: A | Discharge status: Home / Self Care | Payer: Self-pay | Source: Ambulatory Visit | Attending: Cardiovascular Disease

## 2015-05-24 DIAGNOSIS — D649 Anemia, unspecified: Secondary | ICD-10-CM | POA: Insufficient documentation

## 2015-05-24 DIAGNOSIS — R9439 Abnormal result of other cardiovascular function study: Secondary | ICD-10-CM

## 2015-05-24 DIAGNOSIS — I25119 Atherosclerotic heart disease of native coronary artery with unspecified angina pectoris: Secondary | ICD-10-CM | POA: Insufficient documentation

## 2015-05-24 DIAGNOSIS — K219 Gastro-esophageal reflux disease without esophagitis: Secondary | ICD-10-CM | POA: Diagnosis not present

## 2015-05-24 DIAGNOSIS — E785 Hyperlipidemia, unspecified: Secondary | ICD-10-CM | POA: Insufficient documentation

## 2015-05-24 DIAGNOSIS — Z86010 Personal history of colonic polyps: Secondary | ICD-10-CM | POA: Diagnosis not present

## 2015-05-24 DIAGNOSIS — E559 Vitamin D deficiency, unspecified: Secondary | ICD-10-CM | POA: Insufficient documentation

## 2015-05-24 DIAGNOSIS — M3500 Sicca syndrome, unspecified: Secondary | ICD-10-CM | POA: Diagnosis not present

## 2015-05-24 DIAGNOSIS — M549 Dorsalgia, unspecified: Secondary | ICD-10-CM | POA: Diagnosis not present

## 2015-05-24 DIAGNOSIS — M797 Fibromyalgia: Secondary | ICD-10-CM | POA: Diagnosis not present

## 2015-05-24 DIAGNOSIS — I2584 Coronary atherosclerosis due to calcified coronary lesion: Secondary | ICD-10-CM | POA: Insufficient documentation

## 2015-05-24 DIAGNOSIS — M1990 Unspecified osteoarthritis, unspecified site: Secondary | ICD-10-CM | POA: Diagnosis not present

## 2015-05-24 DIAGNOSIS — Z7902 Long term (current) use of antithrombotics/antiplatelets: Secondary | ICD-10-CM | POA: Insufficient documentation

## 2015-05-24 DIAGNOSIS — N182 Chronic kidney disease, stage 2 (mild): Secondary | ICD-10-CM | POA: Insufficient documentation

## 2015-05-24 DIAGNOSIS — I129 Hypertensive chronic kidney disease with stage 1 through stage 4 chronic kidney disease, or unspecified chronic kidney disease: Secondary | ICD-10-CM | POA: Diagnosis not present

## 2015-05-24 DIAGNOSIS — I7300 Raynaud's syndrome without gangrene: Secondary | ICD-10-CM | POA: Insufficient documentation

## 2015-05-24 DIAGNOSIS — I2582 Chronic total occlusion of coronary artery: Secondary | ICD-10-CM | POA: Insufficient documentation

## 2015-05-24 DIAGNOSIS — G629 Polyneuropathy, unspecified: Secondary | ICD-10-CM | POA: Diagnosis not present

## 2015-05-24 DIAGNOSIS — Z8249 Family history of ischemic heart disease and other diseases of the circulatory system: Secondary | ICD-10-CM | POA: Diagnosis not present

## 2015-05-24 DIAGNOSIS — I2510 Atherosclerotic heart disease of native coronary artery without angina pectoris: Secondary | ICD-10-CM | POA: Diagnosis not present

## 2015-05-24 DIAGNOSIS — I252 Old myocardial infarction: Secondary | ICD-10-CM | POA: Diagnosis not present

## 2015-05-24 DIAGNOSIS — Z7982 Long term (current) use of aspirin: Secondary | ICD-10-CM | POA: Insufficient documentation

## 2015-05-24 DIAGNOSIS — G8929 Other chronic pain: Secondary | ICD-10-CM | POA: Insufficient documentation

## 2015-05-24 HISTORY — DX: Abnormal result of other cardiovascular function study: R94.39

## 2015-05-24 HISTORY — PX: CARDIAC CATHETERIZATION: SHX172

## 2015-05-24 HISTORY — DX: Acute myocardial infarction, unspecified: I21.9

## 2015-05-24 HISTORY — DX: Personal history of other diseases of the digestive system: Z87.19

## 2015-05-24 LAB — BASIC METABOLIC PANEL
ANION GAP: 10 (ref 5–15)
BUN: 14 mg/dL (ref 6–20)
CALCIUM: 9.4 mg/dL (ref 8.9–10.3)
CO2: 22 mmol/L (ref 22–32)
CREATININE: 0.98 mg/dL (ref 0.44–1.00)
Chloride: 105 mmol/L (ref 101–111)
GFR calc Af Amer: >60 mL/min (ref >60)
GFR, EST NON AFRICAN AMERICAN: 54 mL/min — AB (ref >60)
Glucose, Bld: 80 mg/dL (ref 65–99)
Potassium: 4.4 mmol/L (ref 3.5–5.1)
Sodium: 137 mmol/L (ref 135–145)

## 2015-05-24 LAB — PROTIME-INR
INR: 1.1 (ref 0.00–1.49)
PROTHROMBIN TIME: 14.4 s (ref 11.6–15.2)

## 2015-05-24 LAB — CBC
HCT: 33 % — ABNORMAL LOW (ref 36.0–46.0)
Hemoglobin: 11 g/dL — ABNORMAL LOW (ref 12.0–15.0)
MCH: 27.8 pg (ref 26.0–34.0)
MCHC: 33.3 g/dL (ref 30.0–36.0)
MCV: 83.3 fL (ref 78.0–100.0)
PLATELETS: 165 10*3/uL (ref 150–400)
RBC: 3.96 MIL/uL (ref 3.87–5.11)
RDW: 14.9 % (ref 11.5–15.5)
WBC: 4.1 10*3/uL (ref 4.0–10.5)

## 2015-05-24 LAB — POCT ACTIVATED CLOTTING TIME: ACTIVATED CLOTTING TIME: 472 s

## 2015-05-24 SURGERY — LEFT HEART CATH AND CORONARY ANGIOGRAPHY
Anesthesia: LOCAL

## 2015-05-24 MED ORDER — CALCIUM CARBONATE ANTACID 500 MG PO CHEW
1.0000 | CHEWABLE_TABLET | Freq: Every day | ORAL | Status: DC
  Administered 2015-05-24 – 2015-05-25 (×2): 200 mg via ORAL
  Filled 2015-05-24 (×3): qty 1

## 2015-05-24 MED ORDER — ISOSORBIDE MONONITRATE ER 30 MG PO TB24
30.0000 mg | ORAL_TABLET | Freq: Every day | ORAL | Status: DC
  Administered 2015-05-25: 09:00:00 30 mg via ORAL
  Filled 2015-05-24: qty 1

## 2015-05-24 MED ORDER — SODIUM CHLORIDE 0.9 % WEIGHT BASED INFUSION
3.0000 mL/kg/h | INTRAVENOUS | Status: DC
  Administered 2015-05-24: 3 mL/kg/h via INTRAVENOUS
  Administered 2015-05-24: 250 mL via INTRAVENOUS

## 2015-05-24 MED ORDER — HEPARIN (PORCINE) IN NACL 2-0.9 UNIT/ML-% IJ SOLN
INTRAMUSCULAR | Status: DC | PRN
  Administered 2015-05-24: 1500 mL

## 2015-05-24 MED ORDER — HEPARIN (PORCINE) IN NACL 2-0.9 UNIT/ML-% IJ SOLN
INTRAMUSCULAR | Status: AC
  Filled 2015-05-24: qty 1500

## 2015-05-24 MED ORDER — CYCLOBENZAPRINE HCL 10 MG PO TABS
10.0000 mg | ORAL_TABLET | Freq: Three times a day (TID) | ORAL | Status: DC | PRN
Start: 2015-05-24 — End: 2015-05-25

## 2015-05-24 MED ORDER — SODIUM CHLORIDE 0.9 % IV SOLN: 250.0000 mL | INTRAVENOUS | Status: DC | PRN

## 2015-05-24 MED ORDER — ACETAMINOPHEN 500 MG PO TABS: 1000.0000 mg | ORAL_TABLET | Freq: Four times a day (QID) | ORAL | Status: DC | PRN

## 2015-05-24 MED ORDER — SODIUM CHLORIDE 0.9% FLUSH: 3.0000 mL | Freq: Two times a day (BID) | INTRAVENOUS | Status: DC

## 2015-05-24 MED ORDER — SODIUM CHLORIDE 0.9 % IV SOLN
250.0000 mg | INTRAVENOUS | Status: DC | PRN
  Administered 2015-05-24: 1.75 mg/kg/h via INTRAVENOUS

## 2015-05-24 MED ORDER — ASPIRIN 81 MG PO CHEW: 81.0000 mg | CHEWABLE_TABLET | ORAL | Status: DC

## 2015-05-24 MED ORDER — SODIUM CHLORIDE 0.9% FLUSH: 3.0000 mL | INTRAVENOUS | Status: DC | PRN

## 2015-05-24 MED ORDER — MIDAZOLAM HCL 2 MG/2ML IJ SOLN
INTRAMUSCULAR | Status: AC
  Filled 2015-05-24: qty 2

## 2015-05-24 MED ORDER — ASPIRIN EC 81 MG PO TBEC
81.0000 mg | DELAYED_RELEASE_TABLET | Freq: Every day | ORAL | Status: DC
  Administered 2015-05-25: 09:00:00 81 mg via ORAL
  Filled 2015-05-24: qty 1

## 2015-05-24 MED ORDER — LIDOCAINE HCL (PF) 1 % IJ SOLN
INTRAMUSCULAR | Status: AC
  Filled 2015-05-24: qty 30

## 2015-05-24 MED ORDER — BIVALIRUDIN 250 MG IV SOLR
INTRAVENOUS | Status: AC
  Filled 2015-05-24: qty 250

## 2015-05-24 MED ORDER — NITROGLYCERIN 0.4 MG SL SUBL: 0.4000 mg | SUBLINGUAL_TABLET | SUBLINGUAL | Status: DC | PRN

## 2015-05-24 MED ORDER — TIZANIDINE HCL 2 MG PO TABS
2.0000 mg | ORAL_TABLET | Freq: Four times a day (QID) | ORAL | Status: DC | PRN
  Filled 2015-05-24: qty 1

## 2015-05-24 MED ORDER — FENTANYL CITRATE (PF) 100 MCG/2ML IJ SOLN
INTRAMUSCULAR | Status: AC
  Filled 2015-05-24: qty 2

## 2015-05-24 MED ORDER — SODIUM CHLORIDE 0.9 % WEIGHT BASED INFUSION: 1.0000 mL/kg/h | INTRAVENOUS | Status: DC

## 2015-05-24 MED ORDER — CLOPIDOGREL BISULFATE 75 MG PO TABS
75.0000 mg | ORAL_TABLET | Freq: Every day | ORAL | Status: DC
Start: 2015-05-25 — End: 2015-05-25
  Administered 2015-05-25: 75 mg via ORAL
  Filled 2015-05-24: qty 1

## 2015-05-24 MED ORDER — CALCIUM CARBONATE ANTACID 420 MG PO CHEW
420.0000 mg | CHEWABLE_TABLET | Freq: Every day | ORAL | Status: DC
  Filled 2015-05-24: qty 1

## 2015-05-24 MED ORDER — ONDANSETRON HCL 4 MG/2ML IJ SOLN: 4.0000 mg | Freq: Four times a day (QID) | INTRAMUSCULAR | Status: DC | PRN

## 2015-05-24 MED ORDER — FERROUS SULFATE 325 (65 FE) MG PO TABS
325.0000 mg | ORAL_TABLET | Freq: Every day | ORAL | Status: DC
  Administered 2015-05-25: 325 mg via ORAL
  Filled 2015-05-24: qty 1

## 2015-05-24 MED ORDER — LIDOCAINE HCL (PF) 1 % IJ SOLN
INTRAMUSCULAR | Status: DC | PRN
  Administered 2015-05-24: 25 mL

## 2015-05-24 MED ORDER — FENTANYL CITRATE (PF) 100 MCG/2ML IJ SOLN
INTRAMUSCULAR | Status: DC | PRN
  Administered 2015-05-24 (×2): 25 ug via INTRAVENOUS

## 2015-05-24 MED ORDER — IOHEXOL 350 MG/ML SOLN
INTRAVENOUS | Status: DC | PRN
  Administered 2015-05-24: 80 mL via INTRA_ARTERIAL

## 2015-05-24 MED ORDER — METOPROLOL SUCCINATE ER 25 MG PO TB24
12.5000 mg | ORAL_TABLET | Freq: Every day | ORAL | Status: DC
  Administered 2015-05-25: 09:00:00 12.5 mg via ORAL
  Filled 2015-05-24: qty 1

## 2015-05-24 MED ORDER — SODIUM CHLORIDE 0.9 % IV SOLN: INTRAVENOUS | Status: AC

## 2015-05-24 MED ORDER — TEMAZEPAM 15 MG PO CAPS: 15.0000 mg | ORAL_CAPSULE | Freq: Every evening | ORAL | Status: DC | PRN

## 2015-05-24 MED ORDER — MIDAZOLAM HCL 2 MG/2ML IJ SOLN
INTRAMUSCULAR | Status: DC | PRN
  Administered 2015-05-24: 1 mg via INTRAVENOUS
  Administered 2015-05-24: 2 mg via INTRAVENOUS

## 2015-05-24 MED ORDER — BIVALIRUDIN BOLUS VIA INFUSION - CUPID
INTRAVENOUS | Status: DC | PRN
  Administered 2015-05-24: 44.25 mg via INTRAVENOUS

## 2015-05-24 SURGICAL SUPPLY — 18 items
BALLN MAVERICK OTW 1.5X15 (BALLOONS) ×2
BALLOON MAVERICK OTW 1.5X15 (BALLOONS) IMPLANT
CATH INFINITI 5FR MULTPACK ANG (CATHETERS) ×1 IMPLANT
CATH MICROGUIDE FINCRSS 150 CM (MICROCATHETER) IMPLANT
CATH VISTA GUIDE 6FR JR4 (CATHETERS) ×1 IMPLANT
DEVICE CLOSURE PERCLS PRGLD 6F (VASCULAR PRODUCTS) IMPLANT
KIT ENCORE 26 ADVANTAGE (KITS) ×1 IMPLANT
KIT HEART LEFT (KITS) ×2 IMPLANT
MICROGUIDE FINECROSS 150 CM (MICROCATHETER) ×2
PACK CARDIAC CATHETERIZATION (CUSTOM PROCEDURE TRAY) ×2 IMPLANT
PERCLOSE PROGLIDE 6F (VASCULAR PRODUCTS) ×2
SHEATH PINNACLE 5F 10CM (SHEATH) ×1 IMPLANT
SHEATH PINNACLE 6F 10CM (SHEATH) ×1 IMPLANT
TRANSDUCER W/STOPCOCK (MISCELLANEOUS) ×2 IMPLANT
TUBING CIL FLEX 10 FLL-RA (TUBING) ×2 IMPLANT
WIRE ASAHI MIRACLEBROS-3 300CM (WIRE) ×1 IMPLANT
WIRE EMERALD 3MM-J .035X150CM (WIRE) ×1 IMPLANT
WIRE HI TORQ WHISPER MS 300CM (WIRE) ×1 IMPLANT

## 2015-05-24 NOTE — H&P (View-Only) (Signed)
Cardiology Office Note Date:  05/04/2015   ID:  TERRAN HOLLENKAMP, DOB 08/18/1935, MRN [id number]  PCP:  Cala Bradford, MD  Cardiologist:  Tonny Bollman, MD    Chief Complaint  Patient presents with  . Follow-up    pt c/o lack of energy and SOB  . Coronary Artery Disease     History of Present Illness: [name] is a 80 y.o. female who presents for follow-up of CAD. She presented with NSTEMI in 2016 and underwent diagnostic catheterization revealing severe disease in the distal posterior descending artery. An attempt was made at wiring this lesion however this resulted in a dissection and the case was aborted. She was previously noted to have a 70% stenosis in that location at the time of cardiac catheterization in 2006. She was maintained on anticoagulant therapy and subsequently underwent relook diagnostic catheterization 3 days later which continued to show a 90% stenosis in the PDA with a healed area of dissection. Medical therapy was recommended. She was maintained on aspirin, Plavix, beta blocker, and long-acting nitrate therapy. She is intolerant to statins and Zetia.  She still has issues with exercise intolerance, shortness of breath with low-level activity, and marked fatigue. Concerned about progressive symptoms, difficulty doing household work. No edema, orthopnea, or PND. No recent medication changes.   Past Medical History  Diagnosis Date  . Dyslipidemia     a. statin intolerant.  . IRON DEFICIENCY ANEMIA SECONDARY TO BLOOD LOSS   . CAD (coronary artery disease) 2006, 2016    a. 08/2014 NSTEMI: Attempted PCI of 85% RPDA - unable to wire, complicated by dissection, case aborted;  b. 08/2014 Relook Cath: LM nl, LAD 60p, SP1 90ost (small), D1 65ost, RI 45/50, RCA nl, RPDA 90 - healed dissection;  b. 08/2014 Echo: EF 55%, mild/mon midapical inferior HK, mild AI, PASP .  [name] SUPRAVENTRICULAR TACHYCARDIA   . Raynaud's syndrome   . GERD   . GASTRIC ANTRAL VASCULAR ECTASIA    . ENDOMETRIOSIS   . SJOGREN'S SYNDROME   . Rosacea, acne   . COLONIC POLYPS, ADENOMATOUS, HX OF   . Essential hypertension   . Insomnia   . Sicca syndrome (HCC)   . Vitamin D deficiency   . CKD (chronic kidney disease), stage II   . Neuropathy (HCC)   . Diverticulitis   . Osteoarthritis     a. back/hips.  . Chronic back pain   . Fibromyalgia     Past Surgical History  Procedure Laterality Date  . Cholecystectomy    . Hiatal hernia repair  2005    and paraesophageal  . Esophagogastroduodenoscopy  11/27/2009    APC tretment of lesions  . Colonoscopy w/ biopsies and polypectomy  12/17/2006    adenomatous polyps, external hemorrhoids  . Tear duct surgery Right     "no plugs or anything"  . Breast lumpectomy with radioactive seed localization Right 01/24/2014    Procedure: BREAST LUMPECTOMY WITH RADIOACTIVE SEED LOCALIZATION;  Surgeon: Avel Peace, MD;  Location: [facility];  Service: General;  Laterality: Right;  . Hernia repair    . Appendectomy    . Breast biopsy Bilateral     "both were negative"  . Breast lumpectomy Bilateral   . Total abdominal hysterectomy      w/BSO  . Cardiac catheterization N/A 08/18/2014    Procedure: Left Heart Cath and Coronary Angiography;  Surgeon: Marykay Lex, MD;  Location: [facility] INVASIVE CV LAB;  Service: Cardiovascular;  Laterality: N/A;  .  Cardiac catheterization  08/18/2014    Procedure: Coronary Balloon Angioplasty;  Surgeon: Marykay Lex, MD;  Location: [facility] INVASIVE CV LAB;  Service: Cardiovascular;;  . Cardiac catheterization N/A 08/21/2014    Procedure: Left Heart Cath and Coronary Angiography;  Surgeon: Marykay Lex, MD;  Location: [facility] INVASIVE CV LAB;  Service: Cardiovascular;  Laterality: N/A;  . Cardiac catheterization  2006  . Eye surgery      Current Outpatient Prescriptions  Medication Sig Dispense Refill  . acetaminophen (TYLENOL) 500 MG tablet Take 1,000 mg by mouth every 6 (six) hours as needed (pain).     [name] aspirin 81 MG tablet Take 81 mg by mouth daily.    . clopidogrel (PLAVIX) 75 MG tablet Take 1 tablet (75 mg total) by mouth daily with breakfast. 90 tablet 3  . ferrous sulfate 325 (65 FE) MG tablet Take 325 mg by mouth daily with breakfast.    . isosorbide mononitrate (IMDUR) 30 MG 24 hr tablet Take 1 tablet (30 mg total) by mouth daily. 90 tablet 3  . metoprolol succinate (TOPROL-XL) 25 MG 24 hr tablet Take 0.5 tablets (12.5 mg total) by mouth daily. 30 tablet 5  . nitroGLYCERIN (NITROSTAT) 0.4 MG SL tablet Place 1 tablet (0.4 mg total) under the tongue every 5 (five) minutes as needed for chest pain. 25 tablet 2  . Pseudoeph-Doxylamine-DM-APAP (NYQUIL MULTI-SYMPTOM PO) Take 1 capsule by mouth at bedtime as needed (for cold).    . temazepam (RESTORIL) 15 MG capsule Take 15 mg by mouth at bedtime as needed for sleep. SLEEP AID     No current facility-administered medications for this visit.    Allergies:   Ezetimibe; Ezetimibe-simvastatin; Morphine; Rosuvastatin; Ambien; Plaquenil; Polytrim; and Prevacid   Social History:  The patient  reports that she has never smoked. She has never used smokeless tobacco. She reports that she drinks alcohol. She reports that she does not use illicit drugs.   Family History:  The patient's family history includes Breast cancer in her daughter, sister, sister, and sister; Cancer in her daughter and sister; Coronary artery disease in her brother, brother, brother, father, and mother; Diabetes in her mother; Heart attack in her father; Heart disease in her brother, father, and mother; Heart failure in her mother; Hyperlipidemia in her mother; Hypertension in her father and mother; Kidney failure in her mother; Peripheral vascular disease in her mother. There is no history of Colon cancer, Esophageal cancer, Stomach cancer, or Rectal cancer.    ROS:  Please see the history of present illness.  Otherwise, review of systems is positive for depression, arm  numbness.  All other systems are reviewed and negative.    PHYSICAL EXAM: VS:  BP 140/74 mmHg  Pulse 70  Ht 5\' 3"  (1.6 m)  Wt 60.963 kg (134 lb 6.4 oz)  BMI 23.81 kg/m2 , BMI Body mass index is 23.81 kg/(m^2). GEN: Well nourished, well developed, in no acute distress HEENT: normal Neck: no JVD, no masses. No carotid bruits Cardiac: RRR without murmur or gallop                Respiratory:  clear to auscultation bilaterally, normal work of breathing GI: soft, nontender, nondistended, + BS MS: no deformity or atrophy Ext: no pretibial edema, pedal pulses 2+= bilaterally Skin: warm and dry, no rash Neuro:  Strength and sensation are intact Psych: euthymic mood, full affect  EKG:  EKG is not ordered today. EKG reviewed from 04/18/15: within normal limits  Recent  Labs: 08/18/2014: TSH 1.629 04/08/2015: BUN 13; Creatinine, Ser 0.92; Hemoglobin 11.9*; Platelets 204; Potassium 4.5; Sodium 135   Lipid Panel     Component Value Date/Time   CHOL 162 08/19/2014 0255   TRIG 98 08/19/2014 0255   HDL 54 08/19/2014 0255   CHOLHDL 3.0 08/19/2014 0255   VLDL 20 08/19/2014 0255   LDLCALC 88 08/19/2014 0255      Wt Readings from Last 3 Encounters:  05/03/15 60.963 kg (134 lb 6.4 oz)  04/08/15 59.421 kg (131 lb)  04/03/15 61.009 kg (134 lb 8 oz)     Cardiac Studies Reviewed: Echo 08/18/2014 Study Conclusions  - Left ventricle: There is mild/moderate hypokinesis of the mid/apical inferior segments. The cavity size was normal. Wall thickness was normal. The estimated ejection fraction was 55%. - Aortic valve: Sclerosis without stenosis. There was mild regurgitation. - Right ventricle: The cavity size was normal. Systolic function was normal. - Pulmonary arteries: PA peak pressure: 43 mm Hg (S).  Cardiac Cath 08/18/2014: Conclusion    1. Severe focal stenosis of the proximal RPDA with moderate to Severe disease in the proximal LAD. 2. RPDA lesion, 85% stenosed. After attempted  PTCA with wire related dissection, there is a 99% residual stenosis post intervention attempt but with TIMI 2 flow and no anginal pain. 3. Unsuccessful Attempted PTCA of rPDA with resulting Dissection but with restored flow after wire removal - TIMI 2 flow. 4. Prox LAD lesion, ~60% stenosed (somewhat hazy shelflike lesion at the takeoff of a First Diagonal with a 60% ostial stenosis and major septal perforator trunk that has a 90% ostial stenosis) 5. Large-caliber ramus intermedius that courses as a large OM branch with moderate diffuse disease. 6. Preserved/low normal EF roughly 50-55% with apical hypo-kinesis/akinesis. 7. Normal LVEDP  Despite presence of the extensive dissection in the PDA with only TIMI 2 flow, the patient remained angina (left arm pain) free and hemodynamically stable throughout the entire procedure despite dissection and poor flow down the PDA.           ASSESSMENT AND PLAN: 1.  CAD, native vessel: progressive dyspnea. Last cath reviewed. Pt known to have dissection of right PDA and moderate proximal LAD stenosis. Recommended exercise Myoview stress test for further risk stratification. Will also check an echocardiogram to evaluate shortness of breath.  2. Hyperlipidemia: multiple drug intolerances. Continue with lifestyle modification.   Current medicines are reviewed with the patient today.  The patient does not have concerns regarding medicines.  Labs/ tests ordered today include:   Orders Placed This Encounter  Procedures  . Myocardial Perfusion Imaging  . Echocardiogram   Signed, Tonny Bollman, MD  05/04/2015 5:37 PM    [facility] [address] [place], [place], [place]  [address] Phone: [phone]; Fax: [phone]

## 2015-05-24 NOTE — Interval H&P Note (Signed)
Cath Lab Visit (complete for each Cath Lab visit)  Clinical Evaluation Leading to the Procedure:   ACS: No.  Non-ACS:    Anginal Classification: CCS III  Anti-ischemic medical therapy: Maximal Therapy (2 or more classes of medications)  Non-Invasive Test Results: High-risk stress test findings: cardiac mortality >3%/year  Prior CABG: No previous CABG      History and Physical Interval Note:  05/24/2015 11:01 AM  [name]  has presented today for surgery, with the diagnosis of abn myoview  The various methods of treatment have been discussed with the patient and family. After consideration of risks, benefits and other options for treatment, the patient has consented to  Procedure(s): Left Heart Cath and Coronary Angiography (N/A) as a surgical intervention .  The patient's history has been reviewed, patient examined, no change in status, stable for surgery.  I have reviewed the patient's chart and labs.  Questions were answered to the patient's satisfaction.     Tonny Bollman

## 2015-05-25 ENCOUNTER — Encounter (HOSPITAL_COMMUNITY): Payer: Self-pay | Admitting: Student

## 2015-05-25 DIAGNOSIS — I25119 Atherosclerotic heart disease of native coronary artery with unspecified angina pectoris: Secondary | ICD-10-CM | POA: Diagnosis not present

## 2015-05-25 DIAGNOSIS — E785 Hyperlipidemia, unspecified: Secondary | ICD-10-CM | POA: Diagnosis not present

## 2015-05-25 DIAGNOSIS — R931 Abnormal findings on diagnostic imaging of heart and coronary circulation: Secondary | ICD-10-CM | POA: Diagnosis not present

## 2015-05-25 DIAGNOSIS — I2510 Atherosclerotic heart disease of native coronary artery without angina pectoris: Secondary | ICD-10-CM | POA: Diagnosis not present

## 2015-05-25 MED ORDER — ISOSORBIDE MONONITRATE ER 30 MG PO TB24: 30.0000 mg | ORAL_TABLET | Freq: Every day | ORAL | Status: DC

## 2015-05-25 MED ORDER — CLOPIDOGREL BISULFATE 75 MG PO TABS: 75.0000 mg | ORAL_TABLET | Freq: Every day | ORAL | Status: DC

## 2015-05-25 MED ORDER — METOPROLOL SUCCINATE ER 25 MG PO TB24: 12.5000 mg | ORAL_TABLET | Freq: Every day | ORAL | Status: DC

## 2015-05-25 NOTE — Progress Notes (Signed)
Hospital Problem List     Active Problems:   Abnormal nuclear stress test     Patient Profile:   Primary Cardiologist: Dr. Excell Seltzer  Subjective   Still having shortness of breath with minimal activity (which prompted her current workup). Denies any chest pain or palpitations. Right groin site without ecchymosis or evidence of a hematoma.  Inpatient Medications    . aspirin EC  81 mg Oral Daily  . calcium carbonate  1 tablet Oral Daily  . clopidogrel  75 mg Oral Q breakfast  . ferrous sulfate  325 mg Oral Q breakfast  . isosorbide mononitrate  30 mg Oral Daily  . metoprolol succinate  12.5 mg Oral Daily  . sodium chloride flush  3 mL Intravenous Q12H    Vital Signs    Filed Vitals:   05/24/15 1600 05/24/15 1922 05/24/15 2000 05/25/15 0405  BP: 113/51 153/48  119/55  Pulse: 70 68  65  Temp: 97.7 F (36.5 C) 97.7 F (36.5 C)  98 F (36.7 C)  TempSrc: Oral Oral  Oral  Resp: 14 15 21 18   Height:      Weight:    138 lb 0.1 oz (62.6 kg)  SpO2: 96% 91%  96%    Intake/Output Summary (Last 24 hours) at 05/25/15 0733 Last data filed at 05/25/15 1610  Gross per 24 hour  Intake    490 ml  Output   1275 ml  Net   -785 ml   Filed Weights   05/24/15 0841 05/25/15 0405  Weight: 130 lb (58.968 kg) 138 lb 0.1 oz (62.6 kg)    Physical Exam    General: Well developed, well nourished, female appearing in no acute distress. Head: Normocephalic, atraumatic.  Neck: Supple without bruits, JVD not elevated. Lungs:  Resp regular and unlabored, CTA without wheezing or rales. Heart: RRR, S1, S2, no S3, S4, or murmur; no rub. Abdomen: Soft, non-tender, non-distended with normoactive bowel sounds. No hepatomegaly. No rebound/guarding. No obvious abdominal masses. Extremities: No clubbing, cyanosis, or edema. Distal pedal pulses are 2+ bilaterally. Right groin without ecchymosis or evidence of a hematoma. Neuro: Alert and oriented X 3. Moves all extremities spontaneously. Psych:  Normal affect.  Labs    CBC  Recent Labs  05/24/15 0915  WBC 4.1  HGB 11.0*  HCT 33.0*  MCV 83.3  PLT 165   Basic Metabolic Panel  Recent Labs  05/24/15 0915  NA 137  K 4.4  CL 105  CO2 22  GLUCOSE 80  BUN 14  CREATININE 0.98  CALCIUM 9.4    Telemetry    NSR, HR in 60's - 70's. Frequent PVC's.  ECG    NSR, HR 64. No acute ST or T-wave changes.   Cardiac Studies and Radiology    Dg Chest 2 View: 05/21/2015  CLINICAL DATA:  Shortness of breath EXAM: CHEST  2 VIEW COMPARISON:  04/08/2015 FINDINGS: The heart size and mediastinal contours are within normal limits. Aortic atherosclerosis. Both lungs are clear. The visualized skeletal structures are unremarkable. IMPRESSION: 1. No acute findings. 2. Aortic atherosclerosis. Electronically Signed   By: Signa Kell M.D.   On: 05/21/2015 13:15    Cardiac Catheterization: 05/24/2015  Prox RCA lesion, 25% stenosed.  Dist RCA lesion, 25% stenosed.  Prox Cx to Mid Cx lesion, 50% stenosed.  Prox LAD lesion, 50% stenosed.  Dist LAD lesion, 25% stenosed.  RPDA lesion, 100% stenosed. Post intervention, there is a 100% residual stenosis.  1. Severe  single-vessel coronary artery disease with total occlusion of the right PDA, attempted PCI unsuccessful due to inability to cross lesion  2. Heavily calcified coronary arteries with mild to moderate diffuse CAD involving the LAD and left circumflex, appropriate for medical therapy  3. Normal LV function by noninvasive assessment  Recommendations: We'll keep for observation overnight, anticipate discharge home tomorrow morning. Options include ongoing medical therapy versus another attempt at CTO-PCI. Will review with interventional colleagues and consider referral to Dr. [place] or Eldridge Dace.  Assessment & Plan    1. Abnormal Stress Test/ History of CAD - had cardiac catheterization in 08/2014 with 85% stenosis in the RPDA but was complicated by dissection and no intervention  was performed. - seen by Dr. Excell Seltzer in 04/2015 for worsening shortness of breath with exertion. Had NST which was high-risk with multi-territory ischemia. A cardiac catheterization was recommended. - cath performed on 05/24/2015 and showed total occlusion of the right PDA and PCI was attempted but unsuccessful due to inability to cross the lesion. Dr. Excell Seltzer recommended ongoing medical therapy vs. attempt at CTO-PCI with Dr. [place] or Dr. Eldridge Dace  - continue ASA, Plavix, Imdur, and Toprol-XL.  2. HLD - intolerant to statins and Zetia  Signed, Ellsworth Lennox , PA-C 7:33 AM 05/25/2015 Pager: [phone] As above, patient seen and examined. Denies chest pain. She does have dyspnea on exertion. Results of cardiac catheterization noted. Plan medical therapy. Continue aspirin, beta blocker and nitrates. Right groin with no hematoma or bruit. Discharge today in follow-up with Dr. Excell Seltzer. Can consider CTO with Dr. Michelle Nasuti for continuing symptoms. Intolerant to statins. FU primary care for mild anemia. > 30 min PA and physician time D2 Olga Millers

## 2015-05-25 NOTE — Care Management Note (Signed)
Case Management Note  Patient Details  Name: [name] MRN: [id number] Date of Birth: Sep 29, 1935  Subjective/Objective:    Patient is from home, pta indep, s/p cath, on plavix, no needs.                Action/Plan:   Expected Discharge Date:                  Expected Discharge Plan:  Home/Self Care  In-House Referral:     Discharge planning Services  CM Consult  Post Acute Care Choice:    Choice offered to:     DME Arranged:    DME Agency:     HH Arranged:    HH Agency:     Status of Service:  Completed, signed off  Medicare Important Message Given:    Date Medicare IM Given:    Medicare IM give by:    Date Additional Medicare IM Given:    Additional Medicare Important Message give by:     If discussed at Long Length of Stay Meetings, dates discussed:    Additional Comments:  Leone Haven, RN 05/25/2015, 11:25 AM

## 2015-05-25 NOTE — Discharge Summary (Signed)
Discharge Summary    Patient ID: [name],  MRN: [id number], DOB/AGE: Apr 04, 1935 80 y.o.  Admit date: 05/24/2015 Discharge date: 05/25/2015  Primary Care Provider: WHITE,CYNTHIA S Primary Cardiologist: Dr. Excell Seltzer  Discharge Diagnoses    Active Problems:   Dyslipidemia-statin and Zetia intol   CAD (coronary artery disease)   Abnormal nuclear stress test   History of Present Illness     [name] is a 80 y.o. female with past medical history of CAD (NSTEMI in 08/2014 with unsuccessful PCI of 85% RDPA, Stage 2 CKD, and HLD who presented to [facility] on 05/24/2015 for a cardiac catheterization.    She was seen by Dr. Excell Seltzer in 04/2015 for worsening shortness of breath with exertion. She had a NST on 05/17/2015 which was high-risk with multi-territory ischemia. A cardiac catheterization was recommended. The risks and benefits of the procedure were reviewed with the patient she she agreed to proceed.  Hospital Course     Consultants: None  Her cardiac catheterization showed total occlusion of the right PDA and PCI was attempted but unsuccessful due to inability to cross the lesion. Dr. Excell Seltzer recommended ongoing medical therapy vs. attempt at CTO-PCI with Dr. Swaziland or Dr. Eldridge Dace. The full report is included below.  The morning following the procedure, she reported still having dyspnea on exertion, similar to what she has been having for the past few months. Denied any chest pain or palpitations. Her right groin cath site was without evidence of a hematoma. Telemetry showed no arrhythmias. Labs were reviewed and showed a mild anemia (Hgb 11.0, consistent with previous results 4+ months ago). Creatinine stable at 0.98. No electrolyte abnormalities noted.   She was last examined by Dr. Jens Som and deemed stable for discharge. She will continue ASA, Plavix, Imdur, and Toprol-XL. Cardiology follow-up has been arranged on 06/05/2015. Dr. Excell Seltzer did not have any upcoming appointments  available so this was arranged on a day he will be in the office if questions arise concerning future procedures.  Discharge Vitals Blood pressure 147/59, pulse 66, temperature 97.9 F (36.6 C), temperature source Oral, resp. rate 18, height  (1.6 m), weight 138 lb 0.1 oz (62.6 kg), SpO2 100 %.  Filed Weights   05/24/15 0841 05/25/15 0405  Weight: 130 lb (58.968 kg) 138 lb 0.1 oz (62.6 kg)    Labs & Radiologic Studies     CBC  Recent Labs  05/24/15 0915  WBC 4.1  HGB 11.0*  HCT 33.0*  MCV 83.3  PLT 165   Basic Metabolic Panel  Recent Labs  05/24/15 0915  NA 137  K 4.4  CL 105  CO2 22  GLUCOSE 80  BUN 14  CREATININE 0.98  CALCIUM 9.4    Dg Chest 2 View: 05/21/2015  CLINICAL DATA:  Shortness of breath EXAM: CHEST  2 VIEW COMPARISON:  04/08/2015 FINDINGS: The heart size and mediastinal contours are within normal limits. Aortic atherosclerosis. Both lungs are clear. The visualized skeletal structures are unremarkable. IMPRESSION: 1. No acute findings. 2. Aortic atherosclerosis. Electronically Signed   By: Signa Kell M.D.   On: 05/21/2015 13:15    Diagnostic Studies/Procedures    Cardiac Catheterization: 05/24/2015  Prox RCA lesion, 25% stenosed.  Dist RCA lesion, 25% stenosed.  Prox Cx to Mid Cx lesion, 50% stenosed.  Prox LAD lesion, 50% stenosed.  Dist LAD lesion, 25% stenosed.  RPDA lesion, 100% stenosed. Post intervention, there is a 100% residual stenosis.  1. Severe single-vessel  coronary artery disease with total occlusion of the right PDA, attempted PCI unsuccessful due to inability to cross lesion  2. Heavily calcified coronary arteries with mild to moderate diffuse CAD involving the LAD and left circumflex, appropriate for medical therapy  3. Normal LV function by noninvasive assessment  Recommendations: We'll keep for observation overnight, anticipate discharge home tomorrow morning. Options include ongoing medical therapy versus another  attempt at CTO-PCI. Will review with interventional colleagues and consider referral to Dr. Swaziland or Eldridge Dace.   Disposition   Pt is being discharged home today in good condition.  Follow-up Plans & Appointments    Follow-up Information    Follow up with Tereso Newcomer, PA-C On 06/05/2015.   Specialties:  Physician Assistant, Radiology, Interventional Cardiology   Why:  Cardiology Hospital Follow-Up on 06/05/2015 at 8:30AM. (Dr. Excell Seltzer in the office that day).   Contact information:   [address] [place] [address] [phone]      Discharge Instructions    Diet - low sodium heart healthy    Complete by:  As directed      Discharge instructions    Complete by:  As directed   PLEASE REMEMBER TO BRING ALL OF YOUR MEDICATIONS TO EACH OF YOUR FOLLOW-UP OFFICE VISITS.  PLEASE ATTEND ALL SCHEDULED FOLLOW-UP APPOINTMENTS.   Activity: Increase activity slowly as tolerated. You may shower, but no soaking baths (or swimming) for 1 week. No driving for 24 hours. No lifting over 5 lbs for 1 week. No sexual activity for 1 week.   You May Return to Work: in 1 week (if applicable)  Wound Care: You may wash cath site gently with soap and water. Keep cath site clean and dry. If you notice pain, swelling, bleeding or pus at your cath site, please call [phone].     Increase activity slowly    Complete by:  As directed            Discharge Medications   Discharge Medication List as of 05/25/2015 10:02 AM    CONTINUE these medications which have CHANGED   Details  clopidogrel (PLAVIX) 75 MG tablet Take 1 tablet (75 mg total) by mouth daily with breakfast., Starting 05/25/2015, Until Discontinued, Print    isosorbide mononitrate (IMDUR) 30 MG 24 hr tablet Take 1 tablet (30 mg total) by mouth daily., Starting 05/25/2015, Until Discontinued, Print    metoprolol succinate (TOPROL-XL) 25 MG 24 hr tablet Take 0.5 tablets (12.5 mg total) by mouth daily., Starting  05/25/2015, Until Discontinued, Print      CONTINUE these medications which have NOT CHANGED   Details  aspirin 81 MG tablet Take 81 mg by mouth daily., Until Discontinued, Historical Med    calcium carbonate (ANTACID) 420 MG CHEW chewable tablet Chew 420 mg by mouth daily., Until Discontinued, Historical Med    ferrous sulfate 325 (65 FE) MG tablet Take 325 mg by mouth daily with breakfast., Until Discontinued, Historical Med    nitroGLYCERIN (NITROSTAT) 0.4 MG SL tablet Place 1 tablet (0.4 mg total) under the tongue every 5 (five) minutes as needed for chest pain., Starting 08/21/2014, Until Discontinued, Print    Polyvinyl Alcohol-Povidone (REFRESH OP) Apply 1 drop to eye 2 (two) times daily., Until Discontinued, Historical Med    Pseudoeph-Doxylamine-DM-APAP (NYQUIL MULTI-SYMPTOM PO) Take 1 capsule by mouth at bedtime as needed (for cold)., Until Discontinued, Historical Med    temazepam (RESTORIL) 15 MG capsule Take 15 mg by mouth at bedtime as needed for sleep.  SLEEP AID, Starting 08/03/2013, Until Discontinued, Historical Med    acetaminophen (TYLENOL) 500 MG tablet Take 1,000 mg by mouth every 6 (six) hours as needed (pain)., Until Discontinued, Historical Med    Biotin 10 MG TABS Take 10 mg by mouth daily., Until Discontinued, Historical Med    cyclobenzaprine (FLEXERIL) 10 MG tablet Take 10 mg by mouth 3 (three) times daily as needed for muscle spasms., Until Discontinued, Historical Med    Multiple Vitamins-Minerals (EMERGEN-C VITAMIN C PO) Take 1 tablet by mouth daily., Until Discontinued, Historical Med    tiZANidine (ZANAFLEX) 2 MG tablet Take 2 mg by mouth every 6 (six) hours as needed for muscle spasms., Until Discontinued, Historical Med      STOP taking these medications     meloxicam (MOBIC) 7.5 MG tablet           Allergies Allergies  Allergen Reactions  . Ezetimibe Other (See Comments)    Muscle pain= zetia  . Ezetimibe-Simvastatin Other (See Comments)     Muscle pain= vytorin  . Morphine Other (See Comments)    nightmares  . Rosuvastatin Other (See Comments)    Muscle pain  . Ambien [Zolpidem Tartrate] Other (See Comments)    Fell down  . Plaquenil [Hydroxychloroquine Sulfate] Other (See Comments)  . Polytrim [Polymyxin B-Trimethoprim] Other (See Comments)    unknown  . Prevacid [Lansoprazole] Other (See Comments)    dizziness     Outstanding Labs/Studies   None  Duration of Discharge Encounter   Greater than 30 minutes including physician time.  Signed, Ellsworth Lennox, PA-C 05/25/2015, 1:34 PM

## 2015-06-05 ENCOUNTER — Encounter: Payer: Medicare Other | Admitting: Physician Assistant

## 2015-06-07 ENCOUNTER — Encounter: Payer: Self-pay | Admitting: Physician Assistant

## 2015-06-07 ENCOUNTER — Ambulatory Visit (INDEPENDENT_AMBULATORY_CARE_PROVIDER_SITE_OTHER): Payer: Medicare Other | Admitting: Physician Assistant

## 2015-06-07 VITALS — BP 152/60 | HR 62 | Ht 63.0 in | Wt 134.1 lb

## 2015-06-07 DIAGNOSIS — I25119 Atherosclerotic heart disease of native coronary artery with unspecified angina pectoris: Secondary | ICD-10-CM | POA: Diagnosis not present

## 2015-06-07 DIAGNOSIS — E785 Hyperlipidemia, unspecified: Secondary | ICD-10-CM | POA: Diagnosis not present

## 2015-06-07 DIAGNOSIS — I10 Essential (primary) hypertension: Secondary | ICD-10-CM | POA: Diagnosis not present

## 2015-06-07 MED ORDER — AMLODIPINE BESYLATE 2.5 MG PO TABS: 2.5000 mg | ORAL_TABLET | Freq: Every day | ORAL | Status: DC
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
## 2015-06-07 NOTE — Patient Instructions (Addendum)
Medication Instructions:  1. START AMLODIPINE 2.5 MG DAILY; RX SENT TO [facility]  Labwork: NONE  Testing/Procedures: NONE  Follow-Up: 1. DR. Excell Seltzer IN 3 MONTHS; I WILL HAVE DR. Earmon Phoenix NURSE LAUREN CALL YOU WITH AN APPT  2. YOU ARE BEING REFERRED TO DR. PETER Swaziland TO DISCUSS ABOUT CTO/PCI PER DR. Excell Seltzer; THIS APPT SHOULD BE IN THE NEXT 2-4 WEEKS  Any Other Special Instructions Will Be Listed Below (If Applicable).  If you need a refill on your cardiac medications before your next appointment, please call your pharmacy.

## 2015-06-12 ENCOUNTER — Telehealth: Payer: Self-pay | Admitting: Cardiovascular Disease

## 2015-06-12 MED ORDER — NITROGLYCERIN 0.4 MG SL SUBL: 0.4000 mg | SUBLINGUAL_TABLET | SUBLINGUAL | Status: DC | PRN

## 2015-06-12 NOTE — Telephone Encounter (Signed)
New message       Pt c/o of Chest Pain: STAT if CP now or developed within 24 hours  1. Are you having CP right now? No--- 2. Are you experiencing any other symptoms (ex. SOB, nausea, vomiting, sweating)? Always have sob 3. How long have you been experiencing CP? Had chest pain yesterday lasting approx 5 minutes 4. Is your CP continuous or coming and going? continuous  5. Have you taken Nitroglycerin? ?no

## 2015-06-12 NOTE — Telephone Encounter (Signed)
Patient seen 5 days ago by Tereso Newcomer ,referred to Dr Swaziland for CTO PCI, to have apt in next 2-4 weeks for consult.  Patient walking yesterday and had throbbing chest pain located  just left of center of chest  which lasted 10 minutes. She continued to walk and went home.did not use NTG because it was not in purse she had with her.Denied N/V,syncope. Has SOB which is chronic. Her son is a neurologist and insisted she call us.    I will forward to Dr Elvis Coil nurse and Dr Corena Pilgrim refilled NTG new rx for her

## 2015-06-12 NOTE — Telephone Encounter (Signed)
Her coronary anatomy was stable at recent cath. Would continue current Rx and keep scheduled appt with Dr Swaziland to discuss CTO-PCI. thx

## 2015-06-13 ENCOUNTER — Encounter (HOSPITAL_COMMUNITY): Payer: Self-pay | Admitting: Neurology

## 2015-06-13 ENCOUNTER — Inpatient Hospital Stay (HOSPITAL_COMMUNITY)
Admission: EM | Admit: 2015-06-13 | Discharge: 2015-06-14 | DRG: 302 | Disposition: A | Discharge status: Home / Self Care | Payer: Medicare Other | Attending: Cardiovascular Disease | Admitting: Cardiovascular Disease

## 2015-06-13 ENCOUNTER — Emergency Department (HOSPITAL_COMMUNITY): Payer: Medicare Other

## 2015-06-13 DIAGNOSIS — Z86010 Personal history of colonic polyps: Secondary | ICD-10-CM

## 2015-06-13 DIAGNOSIS — M797 Fibromyalgia: Secondary | ICD-10-CM | POA: Diagnosis present

## 2015-06-13 DIAGNOSIS — N182 Chronic kidney disease, stage 2 (mild): Secondary | ICD-10-CM | POA: Diagnosis present

## 2015-06-13 DIAGNOSIS — Z90710 Acquired absence of both cervix and uterus: Secondary | ICD-10-CM | POA: Diagnosis not present

## 2015-06-13 DIAGNOSIS — Z885 Allergy status to narcotic agent status: Secondary | ICD-10-CM | POA: Diagnosis not present

## 2015-06-13 DIAGNOSIS — I200 Unstable angina: Secondary | ICD-10-CM | POA: Diagnosis present

## 2015-06-13 DIAGNOSIS — Z833 Family history of diabetes mellitus: Secondary | ICD-10-CM

## 2015-06-13 DIAGNOSIS — Z841 Family history of disorders of kidney and ureter: Secondary | ICD-10-CM | POA: Diagnosis not present

## 2015-06-13 DIAGNOSIS — K219 Gastro-esophageal reflux disease without esophagitis: Secondary | ICD-10-CM | POA: Diagnosis present

## 2015-06-13 DIAGNOSIS — Z7902 Long term (current) use of antithrombotics/antiplatelets: Secondary | ICD-10-CM | POA: Diagnosis not present

## 2015-06-13 DIAGNOSIS — E785 Hyperlipidemia, unspecified: Secondary | ICD-10-CM | POA: Diagnosis present

## 2015-06-13 DIAGNOSIS — I700 Atherosclerosis of aorta: Secondary | ICD-10-CM | POA: Diagnosis present

## 2015-06-13 DIAGNOSIS — R0789 Other chest pain: Secondary | ICD-10-CM | POA: Diagnosis present

## 2015-06-13 DIAGNOSIS — Z803 Family history of malignant neoplasm of breast: Secondary | ICD-10-CM

## 2015-06-13 DIAGNOSIS — I252 Old myocardial infarction: Secondary | ICD-10-CM | POA: Diagnosis not present

## 2015-06-13 DIAGNOSIS — M3500 Sicca syndrome, unspecified: Secondary | ICD-10-CM | POA: Diagnosis present

## 2015-06-13 DIAGNOSIS — Z7982 Long term (current) use of aspirin: Secondary | ICD-10-CM

## 2015-06-13 DIAGNOSIS — I7300 Raynaud's syndrome without gangrene: Secondary | ICD-10-CM | POA: Diagnosis present

## 2015-06-13 DIAGNOSIS — Z888 Allergy status to other drugs, medicaments and biological substances status: Secondary | ICD-10-CM

## 2015-06-13 DIAGNOSIS — R079 Chest pain, unspecified: Secondary | ICD-10-CM

## 2015-06-13 DIAGNOSIS — E559 Vitamin D deficiency, unspecified: Secondary | ICD-10-CM | POA: Diagnosis present

## 2015-06-13 DIAGNOSIS — I5033 Acute on chronic diastolic (congestive) heart failure: Secondary | ICD-10-CM | POA: Diagnosis present

## 2015-06-13 DIAGNOSIS — I208 Other forms of angina pectoris: Secondary | ICD-10-CM

## 2015-06-13 DIAGNOSIS — Z79899 Other long term (current) drug therapy: Secondary | ICD-10-CM | POA: Diagnosis not present

## 2015-06-13 DIAGNOSIS — I25110 Atherosclerotic heart disease of native coronary artery with unstable angina pectoris: Principal | ICD-10-CM | POA: Diagnosis present

## 2015-06-13 DIAGNOSIS — Z8249 Family history of ischemic heart disease and other diseases of the circulatory system: Secondary | ICD-10-CM | POA: Diagnosis not present

## 2015-06-13 DIAGNOSIS — I130 Hypertensive heart and chronic kidney disease with heart failure and stage 1 through stage 4 chronic kidney disease, or unspecified chronic kidney disease: Secondary | ICD-10-CM | POA: Diagnosis present

## 2015-06-13 DIAGNOSIS — I25119 Atherosclerotic heart disease of native coronary artery with unspecified angina pectoris: Secondary | ICD-10-CM | POA: Diagnosis not present

## 2015-06-13 LAB — CBC
HCT: 36.8 % (ref 36.0–46.0)
Hemoglobin: 11.9 g/dL — ABNORMAL LOW (ref 12.0–15.0)
MCH: 26.9 pg (ref 26.0–34.0)
MCHC: 32.3 g/dL (ref 30.0–36.0)
MCV: 83.3 fL (ref 78.0–100.0)
PLATELETS: 194 10*3/uL (ref 150–400)
RBC: 4.42 MIL/uL (ref 3.87–5.11)
RDW: 15 % (ref 11.5–15.5)
WBC: 4.5 10*3/uL (ref 4.0–10.5)

## 2015-06-13 LAB — TROPONIN I

## 2015-06-13 LAB — BASIC METABOLIC PANEL
Anion gap: 10 (ref 5–15)
BUN: 15 mg/dL (ref 6–20)
CALCIUM: 9.6 mg/dL (ref 8.9–10.3)
CO2: 23 mmol/L (ref 22–32)
CREATININE: 1 mg/dL (ref 0.44–1.00)
Chloride: 103 mmol/L (ref 101–111)
GFR, EST NON AFRICAN AMERICAN: 53 mL/min — AB (ref >60)
Glucose, Bld: 99 mg/dL (ref 65–99)
Potassium: 4.8 mmol/L (ref 3.5–5.1)
SODIUM: 136 mmol/L (ref 135–145)

## 2015-06-13 LAB — I-STAT TROPONIN, ED: Troponin i, poc: 0 ng/mL (ref 0.00–0.08)

## 2015-06-13 MED ORDER — HEPARIN SODIUM (PORCINE) 5000 UNIT/ML IJ SOLN
5000.0000 [IU] | Freq: Three times a day (TID) | INTRAMUSCULAR | Status: DC
  Administered 2015-06-13 – 2015-06-14 (×2): 5000 [IU] via SUBCUTANEOUS
  Filled 2015-06-13 (×2): qty 1

## 2015-06-13 MED ORDER — ASPIRIN EC 81 MG PO TBEC
81.0000 mg | DELAYED_RELEASE_TABLET | Freq: Every day | ORAL | Status: DC
  Administered 2015-06-14: 81 mg via ORAL
  Filled 2015-06-13: qty 1

## 2015-06-13 MED ORDER — ONDANSETRON HCL 4 MG/2ML IJ SOLN: 4.0000 mg | Freq: Four times a day (QID) | INTRAMUSCULAR | Status: DC | PRN

## 2015-06-13 MED ORDER — ASPIRIN 81 MG PO TABS: 81.0000 mg | ORAL_TABLET | Freq: Every day | ORAL | Status: DC

## 2015-06-13 MED ORDER — ACETAMINOPHEN 325 MG PO TABS: 650.0000 mg | ORAL_TABLET | ORAL | Status: DC | PRN

## 2015-06-13 MED ORDER — NITROGLYCERIN IN D5W 200-5 MCG/ML-% IV SOLN
10.0000 ug/min | INTRAVENOUS | Status: DC
  Administered 2015-06-13: 10 ug/min via INTRAVENOUS
  Filled 2015-06-13: qty 250

## 2015-06-13 MED ORDER — ASPIRIN 81 MG PO CHEW: 81.0000 mg | CHEWABLE_TABLET | Freq: Every day | ORAL | Status: DC

## 2015-06-13 MED ORDER — METOPROLOL SUCCINATE ER 25 MG PO TB24
12.5000 mg | ORAL_TABLET | Freq: Every day | ORAL | Status: DC
  Administered 2015-06-14: 12.5 mg via ORAL
  Filled 2015-06-13: qty 1

## 2015-06-13 MED ORDER — AMLODIPINE BESYLATE 5 MG PO TABS
2.5000 mg | ORAL_TABLET | Freq: Every day | ORAL | Status: DC
  Administered 2015-06-14: 2.5 mg via ORAL
  Filled 2015-06-13: qty 1

## 2015-06-13 MED ORDER — CLOPIDOGREL BISULFATE 75 MG PO TABS
75.0000 mg | ORAL_TABLET | Freq: Every day | ORAL | Status: DC
  Administered 2015-06-14: 75 mg via ORAL
  Filled 2015-06-13: qty 1

## 2015-06-13 MED ORDER — FUROSEMIDE 10 MG/ML IJ SOLN
40.0000 mg | Freq: Two times a day (BID) | INTRAMUSCULAR | Status: DC
  Administered 2015-06-13: 40 mg via INTRAVENOUS
  Filled 2015-06-13: qty 4

## 2015-06-13 MED ORDER — FERROUS SULFATE 325 (65 FE) MG PO TABS
325.0000 mg | ORAL_TABLET | Freq: Every day | ORAL | Status: DC
  Administered 2015-06-14: 325 mg via ORAL
  Filled 2015-06-13: qty 1

## 2015-06-13 MED ORDER — TIZANIDINE HCL 2 MG PO TABS
2.0000 mg | ORAL_TABLET | Freq: Four times a day (QID) | ORAL | Status: DC | PRN
  Filled 2015-06-13: qty 1

## 2015-06-13 MED ORDER — TEMAZEPAM 15 MG PO CAPS: 15.0000 mg | ORAL_CAPSULE | Freq: Every evening | ORAL | Status: DC | PRN

## 2015-06-13 NOTE — Telephone Encounter (Signed)
Spoke to patient advised Dr.Jordan will be in office tomorrow 06/14/15.I will check with him about a appointment since his schedule is full.

## 2015-06-13 NOTE — ED Notes (Signed)
Cards at bedside

## 2015-06-13 NOTE — Progress Notes (Addendum)
On call MD informed of a low trending BP. No new orders received at this time, will continue to monitor.

## 2015-06-13 NOTE — Consult Note (Addendum)
CARDIOLOGY Admission NOTE   Patient ID: ILAMAE GENG MRN: [id number] DOB/AGE: 1936-02-25 80 y.o.  Admit date: 06/13/2015  Primary Physician   Cala Bradford, MD Primary Cardiologist: Dr. Excell Seltzer Reason for Consultation: Chest pain   HPI: [name] is a 80 y.o. female with a hx of CAD status post non-STEMI in June 2016, HL, HTN, CKD. In June 2016, LHC demonstrated severe disease in the distal PDA. An attempt at wiring the lesion resulted in dissection and the case was aborted. Relook catheterization 3 days later demonstrated 90% stenosis PDA with a healed area of dissection. Medical therapy was recommended.   She saw Dr. Excell Seltzer in 05/04/15 with complaints of decreased exercise tolerance, dyspnea with low-level activity and marked fatigue. Stress nuclear scan was obtained. This was high risk with suggestion of ischemia in the PDA territory as well as the LAD. Cardiac catheterization was arranged.She had left heart cath on 05/24/15 that demonstrated an occluded RPDA. PCI of this lesion was unsuccessful due to inability to cross lesion. Recommendation was to pursue medical therapy versus CTO PCI with Dr. Eldridge Dace or Dr. Swaziland.   She reports having intermittent chest pain for the past 3 days both at rest and with minimal exertion.  At 10am this morning, she had chest tightness and pressure that was associated with nausea, chills and diaphoresis. She took one SL Nitro and had relief from her pain.  Pain was not worse with palpation, pain did not radiate.  She has been feeling short of breath for a few months.  She gets short of breath with walking short distances, and getting out of bed.  Denies orthopnea, but does use 2 pillows to sleep.  Denies nocturnal dyspnea. Her legs have had some swelling the past 3 weeks, it usually resolves by the end of the day.    She is currently chest pain free. Troponin negative x 1.   Past Medical History  Diagnosis Date  . Dyslipidemia     a. statin  intolerant.  . IRON DEFICIENCY ANEMIA SECONDARY TO BLOOD LOSS   . CAD (coronary artery disease) 2006, 2016    a. 08/2014 NSTEMI: Attempted PCI of 85% RPDA - unable to wire, complicated by dissection, case aborted;  b. 08/2014 Relook Cath: LM nl, LAD 60p, SP1 90ost (small), D1 65ost, RI 45/50, RCA nl, RPDA 90 - healed dissection;  b. 08/2014 Echo: EF 55%, mild/mon midapical inferior HK, mild AI, PASP . c. Cath 05/2015 with CTO of the right-PDA and unsuccessful PCI  . SUPRAVENTRICULAR TACHYCARDIA   . Raynaud's syndrome   . GERD   . GASTRIC ANTRAL VASCULAR ECTASIA   . ENDOMETRIOSIS   . SJOGREN'S SYNDROME   . Rosacea, acne   . COLONIC POLYPS, ADENOMATOUS, HX OF   . Essential hypertension   . Insomnia   . Sicca syndrome (HCC)   . Vitamin D deficiency   . Neuropathy (HCC)   . Diverticulitis   . chronic back   . Fibromyalgia   . Abnormal nuclear stress test 05/24/2015  . Myocardial infarction (HCC) 08/2014  . History of hiatal hernia   . Osteoarthritis     "back, hips, basically all over" (05/24/2015)  . CKD (chronic kidney disease), stage II      Past Surgical History  Procedure Laterality Date  . Hiatal hernia repair  2005    and paraesophageal  . Esophagogastroduodenoscopy  11/27/2009    APC tretment of lesions  . Colonoscopy w/ biopsies and polypectomy  12/17/2006    adenomatous polyps, external hemorrhoids  . Tear duct probing Right     "no plugs or anything"  . Breast lumpectomy with radioactive seed localization Right 01/24/2014    Procedure: BREAST LUMPECTOMY WITH RADIOACTIVE SEED LOCALIZATION;  Surgeon: Avel Peace, MD;  Location: [facility];  Service: General;  Laterality: Right;  . Hernia repair    . Appendectomy    . Total abdominal hysterectomy      w/BSO  . Laparoscopic cholecystectomy    . Breast biopsy Bilateral     "both were negative"  . Breast lumpectomy Right X 2  . Cardiac catheterization N/A 08/18/2014    Procedure: Left Heart Cath and  Coronary Angiography;  Surgeon: Marykay Lex, MD;  Location: [facility] INVASIVE CV LAB;  Service: Cardiovascular;  Laterality: N/A;  . Cardiac catheterization  08/18/2014    Procedure: Coronary Balloon Angioplasty;  Surgeon: Marykay Lex, MD;  Location: [facility] INVASIVE CV LAB;  Service: Cardiovascular;;  . Cardiac catheterization N/A 08/21/2014    Procedure: Left Heart Cath and Coronary Angiography;  Surgeon: Marykay Lex, MD;  Location: [facility] INVASIVE CV LAB;  Service: Cardiovascular;  Laterality: N/A;  . Cardiac catheterization  2006  . Cardiac catheterization N/A 05/24/2015    Procedure: Left Heart Cath and Coronary Angiography;  Surgeon: Tonny Bollman, MD;  Location: [facility] INVASIVE CV LAB;  Service: Cardiovascular;  Laterality: N/A;  . Eye surgery      Allergies  Allergen Reactions  . Ezetimibe Other (See Comments)    Muscle pain= zetia  . Ezetimibe-Simvastatin Other (See Comments)    Muscle pain= vytorin  . Morphine Other (See Comments)    nightmares  . Rosuvastatin Other (See Comments)    Muscle pain  . Ambien [Zolpidem Tartrate] Other (See Comments)    Fell down  . Plaquenil [Hydroxychloroquine Sulfate] Other (See Comments)  . Polytrim [Polymyxin B-Trimethoprim] Other (See Comments)    unknown  . Prevacid [Lansoprazole] Other (See Comments)    dizziness    I have reviewed the patient's current medications     Prior to Admission medications   Medication Sig Start Date End Date Taking? Authorizing Provider  acetaminophen (TYLENOL) 500 MG tablet Take 1,000 mg by mouth every 6 (six) hours as needed (pain).   Yes Historical Provider, MD  amLODipine (NORVASC) 2.5 MG tablet Take 1 tablet (2.5 mg total) by mouth daily. 06/07/15  Yes Beatrice Lecher, PA-C  aspirin 81 MG tablet Take 81 mg by mouth daily.   Yes Historical Provider, MD  Biotin 10 MG TABS Take 10 mg by mouth daily.   Yes Historical Provider, MD  calcium carbonate (ANTACID) 420 MG CHEW chewable tablet Chew 420 mg by mouth daily.    Yes Historical Provider, MD  clopidogrel (PLAVIX) 75 MG tablet Take 1 tablet (75 mg total) by mouth daily with breakfast. 05/25/15  Yes Ellsworth Lennox, PA  cyclobenzaprine (FLEXERIL) 10 MG tablet Take 10 mg by mouth 3 (three) times daily as needed for muscle spasms.   Yes Historical Provider, MD  ferrous sulfate 325 (65 FE) MG tablet Take 325 mg by mouth daily with breakfast.   Yes Historical Provider, MD  isosorbide mononitrate (IMDUR) 30 MG 24 hr tablet Take 1 tablet (30 mg total) by mouth daily. 05/25/15  Yes Ellsworth Lennox, PA  metoprolol succinate (TOPROL-XL) 25 MG 24 hr tablet Take 0.5 tablets (12.5 mg total) by mouth daily. 05/25/15  Yes Ellsworth Lennox, PA  Multiple Vitamins-Minerals (  EMERGEN-C VITAMIN C PO) Take 1 tablet by mouth daily.   Yes Historical Provider, MD  Polyvinyl Alcohol-Povidone (REFRESH OP) Place 1 drop into both eyes 2 (two) times daily.    Yes Historical Provider, MD  Pseudoeph-Doxylamine-DM-APAP (NYQUIL MULTI-SYMPTOM PO) Take 1 capsule by mouth at bedtime as needed (for cold).   Yes Historical Provider, MD  temazepam (RESTORIL) 15 MG capsule Take 15 mg by mouth at bedtime as needed for sleep. SLEEP AID 08/03/13  Yes Historical Provider, MD  tiZANidine (ZANAFLEX) 2 MG tablet Take 2 mg by mouth every 6 (six) hours as needed for muscle spasms.   Yes Historical Provider, MD  triamcinolone cream (KENALOG) 0.1 % Apply 1 application topically daily as needed (FOR [facility]).  05/22/15  Yes Historical Provider, MD  nitroGLYCERIN (NITROSTAT) 0.4 MG SL tablet Place 1 tablet (0.4 mg total) under the tongue every 5 (five) minutes as needed for chest pain. 06/12/15   Tonny Bollman, MD     Social History   Social History  . Marital Status: Married    Spouse Name: N/A  . Number of Children: 3  . Years of Education: college   Occupational History  . retired    Social History Main Topics  . Smoking status: Never Smoker   . Smokeless tobacco: Never Used  . Alcohol Use: Yes      Comment: 05/24/2015 "I may have a glass of wine a couple times/year"  . Drug Use: No  . Sexual Activity: Not Currently   Other Topics Concern  . Not on file   Social History Narrative   Patient drinks caffeine occasionally.   Patient is right handed.       Family Status  Relation Status Death Age  . Mother Deceased 37    CAD  . Sister Deceased 47  . Father Deceased   . Brother Alive     CABG  . Brother Alive   . Brother Alive     Fuchs Dystrophy  . Sister Alive   . Sister Alive     Fuchs Dystrophy  . Maternal Grandmother Deceased   . Maternal Grandfather Deceased 69  . Paternal Grandmother Deceased     CAD  . Paternal Grandfather Deceased     drown  . Sister Alive    Family History  Problem Relation Age of Onset  . Heart failure Mother   . Diabetes Mother   . Kidney failure Mother   . Hypertension Mother   . Coronary artery disease Mother   . Heart disease Mother   . Hyperlipidemia Mother   . Peripheral vascular disease Mother     amputation  . Breast cancer Sister     x 3 sisters  . Cancer Sister   . Heart disease Father   . Hypertension Father   . Coronary artery disease Father   . Heart attack Father   . Breast cancer Daughter   . Cancer Daughter   . Colon cancer Neg Hx   . Esophageal cancer Neg Hx   . Stomach cancer Neg Hx   . Rectal cancer Neg Hx   . Coronary artery disease Brother   . Heart disease Brother   . Coronary artery disease Brother   . Coronary artery disease Brother   . Breast cancer Sister   . Breast cancer Sister      ROS:  Full 14 point review of systems complete and found to be negative unless listed above.  Physical Exam: Blood pressure 116/50, pulse  62, temperature 97.9 F (36.6 C), temperature source Oral, resp. rate 13, SpO2 96 %.  General: Well developed, well nourished, female in no acute distress Head: Eyes PERRLA, No xanthomas.   Normocephalic and atraumatic, oropharynx without edema or exudate. Dentition: Good Lungs:  CTA.  Heart: HRRR S1 S2, no rub/gallop, No murmur. Pulses are 2+ extrem.   Neck: No carotid bruits. No lymphadenopathy.  No JVD. Abdomen: Bowel sounds present, abdomen soft and non-tender without masses or hernias noted. Msk:  No spine or cva tenderness. No weakness, no joint deformities or effusions. Extremities: No clubbing or cyanosis. No edema Neuro: Alert and oriented X 3. No focal deficits noted. Psych:  Good affect, responds appropriately Skin: No rashes or lesions noted.  Labs:   Lab Results  Component Value Date   WBC 4.5 06/13/2015   HGB 11.9* 06/13/2015   HCT 36.8 06/13/2015   MCV 83.3 06/13/2015   PLT 194 06/13/2015    Recent Labs Lab 06/13/15 1345  NA 136  K 4.8  CL 103  CO2 23  BUN 15  CREATININE 1.00  CALCIUM 9.6  GLUCOSE 99    Recent Labs  06/13/15 1404  TROPIPOC 0.00    Echo:  - Left ventricle: The cavity size was normal. Wall thickness was  normal. Systolic function was normal. The estimated ejection  fraction was in the range of 55% to 60%. Wall motion was normal;  there were no regional wall motion abnormalities. Doppler  parameters are consistent with abnormal left ventricular  relaxation (grade 1 diastolic dysfunction). - Aortic valve: There was mild stenosis. There was mild  regurgitation. Mean gradient (S): 8 mm Hg. Peak gradient (S): 16  mm Hg. - Mitral valve: Calcified annulus. Mildly thickened leaflets . - Pulmonary arteries: PA peak pressure: 34 mm Hg (S).  ECG: NSR   Radiology:  Dg Chest 2 View  06/13/2015  CLINICAL DATA:  Chest pain 2-3 days, shortness of Breath EXAM: CHEST  2 VIEW COMPARISON:  05/21/2015 FINDINGS: Mild hyperinflation of the lungs. Heart is normal size. No confluent airspace opacities or effusions. No acute bony abnormality. IMPRESSION: Hyperinflation.  No active disease. Electronically Signed   By: Charlett Nose M.D.   On: 06/13/2015 14:11    ASSESSMENT AND PLAN:    Active Problems:   Chest pain with  high risk for cardiac etiology  1. Unstable angina with known coronary disease: Patient reports symptoms have occurred over the past 3 days, and she has known occlusion.  We will admit and put on IV Nitro.  Will offer films to Dr. Eldridge Dace and Dr. Swaziland for review for CTO-PCI. Will cycle enzymes.   2. Acute on chronic diastolic CHF: Patient endorses some lower extremity edema and SOB.  Will diurese. She had echo this month, report above.    Signed: Little Ishikawa, NP 06/13/2015 4:20 PM Pager [phone]  I have personally seen and examined this patient with [name] Righter, NP. I agree with the assessment and plan as outlined above. [name] has known CAD and is on good medical therapy with beta blocker, Norvasc and Imdur. She has had ongoing dyspnea, fatigue and chest pressure for months. Cardiac cath per Dr. Excell Seltzer 05/24/15 with attempted PCI of occluded PDA, unsuccessful as this was a CTO. She was referred for consideration for CTO PCI of the PDA but has not yet had this appt. Now with chest pressure and left arm pain today, worsened dyspnea, LE swelling. My exam shows a well developed female with  clear lungs, mild bilateral LE edema, RRR with systolic murmur. Labs reviewed. Troponin negative. EKG unchanged. Will admit to tele. IV NTG. Hold Imdur. Cycle troponin. One time dose IV Lasix. Review films with CTO team today.   Xaiver Roskelley 06/13/2015' 5:02 PM

## 2015-06-13 NOTE — ED Notes (Signed)
Per ems- Pt reports today at 10 am was trying to get out of bed, developed left arm pain, which has been consistent with her heart problems in the past. Reports has 100% to a vessel in her heart which can't be stented, is waiting for specialist. Took 1 nitro, pain was relieved. Had nausea, which has been relieved after 4 mg zofran. Given 324 aspirin. Is a x 4. No pain at current

## 2015-06-13 NOTE — ED Provider Notes (Signed)
CSN: [id number]     Arrival date & time 06/13/15  1330 History   First MD Initiated Contact with Patient 06/13/15 1344     Chief Complaint  Patient presents with  . Chest Pain      HPI  Patient presents for evaluation of chest pain that resolved after taking nitroglycerin at home.  Patient is a comp case history of coronary artery disease. Had a cath last year that showed PDA lesion. Is a difficult approach. Attempt at PCI resulted in dissection.  Repeat cath several days later showed resolution of dissection, but the attempted PCI was E and unsuccessful. She was placed on medical therapy with long-acting nitrates.  Over the last several weeks she has had episodes of pain. Seen in February, and underwent nuclear medicine testing which showed symptoms in the distribution of the PDA. She underwent attempted again at PCI which is unsuccessful. Currently, follow with cardiology under medical therapy with consideration for possible CTO PCI.  [place] morning she was at home. Minimally exerting. Simply in her bed. She states that she's had intermittent episodes of pain this week lasting 10 or 15 minutes and resolving with rest. This morning "getting dressed she started having pain that did not resolve with rest and she ended up taking a nitroglycerin. She sat, her pain resolved. She walked down stairs without recurrence of symptoms. Her spouse called 911. Transferred here. She describes pain in the left arm, tightness in the chest and nausea. She typically states she does not get the tightness in the chest that her main anginal equivalent is left arm pain.  Past Medical History  Diagnosis Date  . Dyslipidemia     a. statin intolerant.  . IRON DEFICIENCY ANEMIA SECONDARY TO BLOOD LOSS   . CAD (coronary artery disease) 2006, 2016    a. 08/2014 NSTEMI: Attempted PCI of 85% RPDA - unable to wire, complicated by dissection, case aborted;  b. 08/2014 Relook Cath: LM nl, LAD 60p, SP1 90ost (small), D1 65ost, RI  45/50, RCA nl, RPDA 90 - healed dissection;  b. 08/2014 Echo: EF 55%, mild/mon midapical inferior HK, mild AI, PASP . c. Cath 05/2015 with CTO of the right-PDA and unsuccessful PCI  . SUPRAVENTRICULAR TACHYCARDIA   . Raynaud's syndrome   . GERD   . GASTRIC ANTRAL VASCULAR ECTASIA   . ENDOMETRIOSIS   . SJOGREN'S SYNDROME   . Rosacea, acne   . COLONIC POLYPS, ADENOMATOUS, HX OF   . Essential hypertension   . Insomnia   . Sicca syndrome (HCC)   . Vitamin D deficiency   . Neuropathy (HCC)   . Diverticulitis   . chronic back   . Fibromyalgia   . Abnormal nuclear stress test 05/24/2015  . Myocardial infarction (HCC) 08/2014  . History of hiatal hernia   . Osteoarthritis     "back, hips, basically all over" (05/24/2015)  . CKD (chronic kidney disease), stage II    Past Surgical History  Procedure Laterality Date  . Hiatal hernia repair  2005    and paraesophageal  . Esophagogastroduodenoscopy  11/27/2009    APC tretment of lesions  . Colonoscopy w/ biopsies and polypectomy  12/17/2006    adenomatous polyps, external hemorrhoids  . Tear duct probing Right     "no plugs or anything"  . Breast lumpectomy with radioactive seed localization Right 01/24/2014    Procedure: BREAST LUMPECTOMY WITH RADIOACTIVE SEED LOCALIZATION;  Surgeon: Avel Peace, MD;  Location: [facility];  Service: General;  Laterality: Right;  . Hernia repair    . Appendectomy    . Total abdominal hysterectomy      w/BSO  . Laparoscopic cholecystectomy    . Breast biopsy Bilateral     "both were negative"  . Breast lumpectomy Right X 2  . Cardiac catheterization N/A 08/18/2014    Procedure: Left Heart Cath and Coronary Angiography;  Surgeon: Marykay Lex, MD;  Location: [facility] INVASIVE CV LAB;  Service: Cardiovascular;  Laterality: N/A;  . Cardiac catheterization  08/18/2014    Procedure: Coronary Balloon Angioplasty;  Surgeon: Marykay Lex, MD;  Location: [facility] INVASIVE CV LAB;  Service:  Cardiovascular;;  . Cardiac catheterization N/A 08/21/2014    Procedure: Left Heart Cath and Coronary Angiography;  Surgeon: Marykay Lex, MD;  Location: [facility] INVASIVE CV LAB;  Service: Cardiovascular;  Laterality: N/A;  . Cardiac catheterization  2006  . Cardiac catheterization N/A 05/24/2015    Procedure: Left Heart Cath and Coronary Angiography;  Surgeon: Tonny Bollman, MD;  Location: [facility] INVASIVE CV LAB;  Service: Cardiovascular;  Laterality: N/A;  . Eye surgery     Family History  Problem Relation Age of Onset  . Heart failure Mother   . Diabetes Mother   . Kidney failure Mother   . Hypertension Mother   . Coronary artery disease Mother   . Heart disease Mother   . Hyperlipidemia Mother   . Peripheral vascular disease Mother     amputation  . Breast cancer Sister     x 3 sisters  . Cancer Sister   . Heart disease Father   . Hypertension Father   . Coronary artery disease Father   . Heart attack Father   . Breast cancer Daughter   . Cancer Daughter   . Colon cancer Neg Hx   . Esophageal cancer Neg Hx   . Stomach cancer Neg Hx   . Rectal cancer Neg Hx   . Coronary artery disease Brother   . Heart disease Brother   . Coronary artery disease Brother   . Coronary artery disease Brother   . Breast cancer Sister   . Breast cancer Sister    Social History  Substance Use Topics  . Smoking status: Never Smoker   . Smokeless tobacco: Never Used  . Alcohol Use: Yes     Comment: 05/24/2015 "I may have a glass of wine a couple times/year"   OB History    No data available     Review of Systems  Constitutional: Negative for fever, chills, diaphoresis, appetite change and fatigue.  HENT: Negative for mouth sores, sore throat and trouble swallowing.   Eyes: Negative for visual disturbance.  Respiratory: Positive for chest tightness. Negative for cough, shortness of breath and wheezing.   Cardiovascular: Positive for chest pain.       Left arm pain  Gastrointestinal: Negative  for nausea, vomiting, abdominal pain, diarrhea and abdominal distention.  Endocrine: Negative for polydipsia, polyphagia and polyuria.  Genitourinary: Negative for dysuria, frequency and hematuria.  Musculoskeletal: Negative for gait problem.  Skin: Negative for color change, pallor and rash.  Neurological: Negative for dizziness, syncope, light-headedness and headaches.  Hematological: Does not bruise/bleed easily.  Psychiatric/Behavioral: Negative for behavioral problems and confusion.      Allergies  Ezetimibe; Ezetimibe-simvastatin; Morphine; Rosuvastatin; Ambien; Plaquenil; Polytrim; and Prevacid  Home Medications   Prior to Admission medications   Medication Sig Start Date End Date Taking? Authorizing Provider  acetaminophen (TYLENOL) 500 MG tablet Take 1,000  mg by mouth every 6 (six) hours as needed (pain).   Yes Historical Provider, MD  amLODipine (NORVASC) 2.5 MG tablet Take 1 tablet (2.5 mg total) by mouth daily. 06/07/15  Yes Beatrice Lecher, PA-C  aspirin 81 MG tablet Take 81 mg by mouth daily.   Yes Historical Provider, MD  Biotin 10 MG TABS Take 10 mg by mouth daily.   Yes Historical Provider, MD  calcium carbonate (ANTACID) 420 MG CHEW chewable tablet Chew 420 mg by mouth daily.   Yes Historical Provider, MD  clopidogrel (PLAVIX) 75 MG tablet Take 1 tablet (75 mg total) by mouth daily with breakfast. 05/25/15  Yes Ellsworth Lennox, PA  cyclobenzaprine (FLEXERIL) 10 MG tablet Take 10 mg by mouth 3 (three) times daily as needed for muscle spasms.   Yes Historical Provider, MD  ferrous sulfate 325 (65 FE) MG tablet Take 325 mg by mouth daily with breakfast.   Yes Historical Provider, MD  isosorbide mononitrate (IMDUR) 30 MG 24 hr tablet Take 1 tablet (30 mg total) by mouth daily. 05/25/15  Yes Ellsworth Lennox, PA  metoprolol succinate (TOPROL-XL) 25 MG 24 hr tablet Take 0.5 tablets (12.5 mg total) by mouth daily. 05/25/15  Yes Ellsworth Lennox, PA  Multiple  Vitamins-Minerals (EMERGEN-C VITAMIN C PO) Take 1 tablet by mouth daily.   Yes Historical Provider, MD  Polyvinyl Alcohol-Povidone (REFRESH OP) Place 1 drop into both eyes 2 (two) times daily.    Yes Historical Provider, MD  Pseudoeph-Doxylamine-DM-APAP (NYQUIL MULTI-SYMPTOM PO) Take 1 capsule by mouth at bedtime as needed (for cold).   Yes Historical Provider, MD  temazepam (RESTORIL) 15 MG capsule Take 15 mg by mouth at bedtime as needed for sleep. SLEEP AID 08/03/13  Yes Historical Provider, MD  tiZANidine (ZANAFLEX) 2 MG tablet Take 2 mg by mouth every 6 (six) hours as needed for muscle spasms.   Yes Historical Provider, MD  triamcinolone cream (KENALOG) 0.1 % Apply 1 application topically daily as needed (FOR [facility]).  05/22/15  Yes Historical Provider, MD  nitroGLYCERIN (NITROSTAT) 0.4 MG SL tablet Place 1 tablet (0.4 mg total) under the tongue every 5 (five) minutes as needed for chest pain. 06/12/15   Tonny Bollman, MD   BP 135/62 mmHg  Pulse 66  Temp(Src) 97.9 F (36.6 C) (Oral)  Resp 14  SpO2 99% Physical Exam  Constitutional: She is oriented to person, place, and time. She appears well-developed and well-nourished. No distress.  HENT:  Head: Normocephalic.  Eyes: Conjunctivae are normal. Pupils are equal, round, and reactive to light. No scleral icterus.  Neck: Normal range of motion. Neck supple. No thyromegaly present.  Cardiovascular: Normal rate and regular rhythm.  Exam reveals no gallop and no friction rub.   No murmur heard. Pulmonary/Chest: Effort normal and breath sounds normal. No respiratory distress. She has no wheezes. She has no rales.  Abdominal: Soft. Bowel sounds are normal. She exhibits no distension. There is no tenderness. There is no rebound.  Musculoskeletal: Normal range of motion.  Neurological: She is alert and oriented to person, place, and time.  Skin: Skin is warm and dry. No rash noted.  Psychiatric: She has a normal mood and affect. Her behavior is  normal.    ED Course  Procedures (including critical care time) Labs Review Labs Reviewed  BASIC METABOLIC PANEL - Abnormal; Notable for the following:    GFR calc non Af Amer 53 (*)    All other components within normal limits  CBC - Abnormal; Notable for the following:    Hemoglobin 11.9 (*)    All other components within normal limits  I-STAT TROPOININ, ED    Imaging Review Dg Chest 2 View  06/13/2015  CLINICAL DATA:  Chest pain 2-3 days, shortness of Breath EXAM: CHEST  2 VIEW COMPARISON:  05/21/2015 FINDINGS: Mild hyperinflation of the lungs. Heart is normal size. No confluent airspace opacities or effusions. No acute bony abnormality. IMPRESSION: Hyperinflation.  No active disease. Electronically Signed   By: Charlett Nose M.D.   On: 06/13/2015 14:11   I have personally reviewed and evaluated these images and lab results as part of my medical decision-making.   EKG Interpretation   Date/Time:  Wednesday June 13 2015 13:30:48 EDT Ventricular Rate:  65 PR Interval:  132 QRS Duration: 96 QT Interval:  416 QTC Calculation: 432 R Axis:   68 Text Interpretation:  Sinus rhythm No ischemic changes. Confirmed by Fayrene Fearing   MD, Dmya Long ([id number]) on 06/13/2015 1:54:42 PM      MDM   Final diagnoses:  Angina at rest [facility])    Symptom free and with unchanged EKG, normal troponin after less than one hour of pain at home. Has a known culprit PDA lesion that has not been amenable to therapy. On near maximal medical therapy. Has been discussed between cardiologists for possible CTO PCI. Pain-free here. I discussed case with cardiology and will be seen in the emergency room for disposition.    Rolland Porter, MD 06/13/15 1531

## 2015-06-13 NOTE — Progress Notes (Signed)
Pt has arrived to 2W from ED. Telemetry box has been applied and CCMD has been notified. Vitals are stable. Pt resting comfortably in bed. Will continue to monitor.   Berdine Dance RN, BSN

## 2015-06-13 NOTE — Telephone Encounter (Signed)
Pt in hospital. Discussed with Dr Clifton James.

## 2015-06-14 DIAGNOSIS — I25119 Atherosclerotic heart disease of native coronary artery with unspecified angina pectoris: Secondary | ICD-10-CM

## 2015-06-14 LAB — TROPONIN I

## 2015-06-14 NOTE — Progress Notes (Signed)
     SUBJECTIVE: No chest pain. Less dyspnea.   Tele: sinus  BP 122/59 mmHg  Pulse 72  Temp(Src) 98.8 F (37.1 C) (Oral)  Resp 18  Ht 5\' 3"  (1.6 m)  Wt 133 lb 6.4 oz (60.51 kg)  BMI 23.64 kg/m2  SpO2 8%  Intake/Output Summary (Last 24 hours) at 06/14/15 0745 Last data filed at 06/14/15 0348  Gross per 24 hour  Intake      0 ml  Output   1800 ml  Net  -1800 ml    PHYSICAL EXAM General: Well developed, well nourished, in no acute distress. Alert and oriented x 3.  Psych:  Good affect, responds appropriately Neck: No JVD. No masses noted.  Lungs: Clear bilaterally with no wheezes or rhonci noted.  Heart: RRR with no murmurs noted. Abdomen: Bowel sounds are present. Soft, non-tender.  Extremities: No lower extremity edema.   LABS: Basic Metabolic Panel:  Recent Labs  82/64/15 1345  NA 136  K 4.8  CL 103  CO2 23  GLUCOSE 99  BUN 15  CREATININE 1.00  CALCIUM 9.6   CBC:  Recent Labs  06/13/15 1345  WBC 4.5  HGB 11.9*  HCT 36.8  MCV 83.3  PLT 194   Cardiac Enzymes:  Recent Labs  06/13/15 2242 06/14/15 0433  TROPONINI <0.03 <0.03    Current Meds: . amLODipine  2.5 mg Oral Daily  . aspirin  81 mg Oral Daily  . aspirin EC  81 mg Oral Daily  . clopidogrel  75 mg Oral Q breakfast  . ferrous sulfate  325 mg Oral Q breakfast  . heparin  5,000 Units Subcutaneous 3 times per day  . metoprolol succinate  12.5 mg Oral Daily     ASSESSMENT AND PLAN:  1. CAD/Chest pain: She has chronic fatigue and dyspnea. She is known to have total occlusion of a small PDA with unsuccessful PCI  Attempt earlier this month. This has been reviewed by our CTO team and not favorable for PCI. I do not think this small vessel is contributory to her fatigue. Troponin is negative. EKG without ischemic changes. Repeat cath is not indicated. Will add Imdur today. Stop IV NTG. Ambulate. Home later this am. Can consider Ranexa as outpatient if Imdur is not effective.    [name]  3/30/20177:45 AM

## 2015-06-14 NOTE — Discharge Summary (Signed)
Discharge Summary    Patient ID: [name],  MRN: [id number], DOB/AGE: Jul 25, 1935 80 y.o.  Admit date: 06/13/2015 Discharge date: 06/14/2015  Primary Care Provider: Cala Bradford Primary Cardiologist: Excell Seltzer  Discharge Diagnoses    Active Problems:   Chest pain with high risk for cardiac etiology   Unstable angina (HCC)   Acute on chronic diastolic CHF  Allergies Allergies  Allergen Reactions  . Ezetimibe Other (See Comments)    Muscle pain= zetia  . Ezetimibe-Simvastatin Other (See Comments)    Muscle pain= vytorin  . Morphine Other (See Comments)    nightmares  . Rosuvastatin Other (See Comments)    Muscle pain  . Ambien [Zolpidem Tartrate] Other (See Comments)    Fell down  . Plaquenil [Hydroxychloroquine Sulfate] Other (See Comments)  . Polytrim [Polymyxin B-Trimethoprim] Other (See Comments)    unknown  . Prevacid [Lansoprazole] Other (See Comments)    dizziness    Diagnostic Studies/Procedures    CHEST 2 VIEW  COMPARISON: 04/08/2015  FINDINGS: The heart size and mediastinal contours are within normal limits. Aortic atherosclerosis. Both lungs are clear. The visualized skeletal structures are unremarkable.  IMPRESSION: 1. No acute findings. 2. Aortic atherosclerosis. _____________   History of Present Illness     [name] is a 80 y.o. female with a hx of CAD status post non-STEMI in June 2016, HL, HTN, CKD. In June 2016, LHC demonstrated severe disease in the distal PDA. An attempt at wiring the lesion resulted in dissection and the case was aborted. Relook catheterization 3 days later demonstrated 90% stenosis PDA with a healed area of dissection. Medical therapy was recommended.  She saw Dr. Excell Seltzer in 05/04/15 with complaints of decreased exercise tolerance, dyspnea with low-level activity and marked fatigue. Stress nuclear scan was obtained. This was high risk with suggestion of ischemia in the PDA territory as well as the LAD. Cardiac  catheterization was arranged.She had left heart cath on 05/24/15 that demonstrated an occluded RPDA. PCI of this lesion was unsuccessful due to inability to cross lesion. Recommendation was to pursue medical therapy versus CTO PCI with Dr. Eldridge Dace or Dr. [place].   She reports having intermittent chest pain for the past 3 days both at rest and with minimal exertion. At 10am this morning, she had chest tightness and pressure that was associated with nausea, chills and diaphoresis. She took one SL Nitro and had relief from her pain. Pain was not worse with palpation, pain did not radiate. She has been feeling short of breath for a few months. She gets short of breath with walking short distances, and getting out of bed. Denies orthopnea, but does use 2 pillows to sleep. Denies nocturnal dyspnea. Her legs have had some swelling the past 3 weeks, it usually resolves by the end of the day.    Hospital Course     Consultants: None  Admitted for observation start IV nitroglycerin. We held her Imdur and give her one dose of IV Lasix. She ruled out for myocardial infarction.  Her cath films were reviewed by our CTO team and not favorable for PCI.  Was thought that the small vessel was not contributory to her fatigue. EKG was without ischemic changes. We resumed her indoor stopped IV nitroglycerin.  Consider adding ranexa in the office.  The patient was seen by Dr. Clifton James who felt she was stable for DC home.    _____________  Discharge Vitals Blood pressure 107/87, pulse 72, temperature 98.8 F (37.1 C),  temperature source Oral, resp. rate 18, height 5\' 3"  (1.6 m), weight 133 lb 6.4 oz (60.51 kg), SpO2 8 %.  Filed Weights   06/13/15 1817  Weight: 133 lb 6.4 oz (60.51 kg)    Labs & Radiologic Studies    CBC  Recent Labs  06/13/15 1345  WBC 4.5  HGB 11.9*  HCT 36.8  MCV 83.3  PLT 194   Basic Metabolic Panel  Recent Labs  06/13/15 1345  NA 136  K 4.8  CL 103  CO2 23  GLUCOSE 99    BUN 15  CREATININE 1.00  CALCIUM 9.6   Liver Function Tests No results for input(s): AST, ALT, ALKPHOS, BILITOT, PROT, ALBUMIN in the last 72 hours. No results for input(s): LIPASE, AMYLASE in the last 72 hours. Cardiac Enzymes  Recent Labs  06/13/15 2242 06/14/15 0433  TROPONINI <0.03 <0.03   _____________  Dg Chest 2 View  06/13/2015  CLINICAL DATA:  Chest pain 2-3 days, shortness of Breath EXAM: CHEST  2 VIEW COMPARISON:  05/21/2015 FINDINGS: Mild hyperinflation of the lungs. Heart is normal size. No confluent airspace opacities or effusions. No acute bony abnormality. IMPRESSION: Hyperinflation.  No active disease. Electronically Signed   By: Charlett Nose M.D.   On: 06/13/2015 14:11   Dg Chest 2 View  05/21/2015  CLINICAL DATA:  Shortness of breath EXAM: CHEST  2 VIEW COMPARISON:  04/08/2015 FINDINGS: The heart size and mediastinal contours are within normal limits. Aortic atherosclerosis. Both lungs are clear. The visualized skeletal structures are unremarkable. IMPRESSION: 1. No acute findings. 2. Aortic atherosclerosis. Electronically Signed   By: Signa Kell M.D.   On: 05/21/2015 13:15   Disposition   Pt is being discharged home today in good condition.  Follow-up Plans & Appointments    Follow-up Information    Follow up with Jacolyn Reedy, PA-C On 07/02/2015.   Specialty:  Cardiology   Why:  10:00 AM   Contact information:   [address] [place] [address] [phone]      Discharge Instructions    Diet - low sodium heart healthy    Complete by:  As directed      Increase activity slowly    Complete by:  As directed            Discharge Medications   Current Discharge Medication List    CONTINUE these medications which have NOT CHANGED   Details  acetaminophen (TYLENOL) 500 MG tablet Take 1,000 mg by mouth every 6 (six) hours as needed (pain).    amLODipine (NORVASC) 2.5 MG tablet Take 1 tablet (2.5 mg total) by mouth  daily. Qty: 90 tablet, Refills: 3   Associated Diagnoses: Essential hypertension    aspirin 81 MG tablet Take 81 mg by mouth daily.    Biotin 10 MG TABS Take 10 mg by mouth daily.    calcium carbonate (ANTACID) 420 MG CHEW chewable tablet Chew 420 mg by mouth daily.    clopidogrel (PLAVIX) 75 MG tablet Take 1 tablet (75 mg total) by mouth daily with breakfast. Qty: 90 tablet, Refills: 3    cyclobenzaprine (FLEXERIL) 10 MG tablet Take 10 mg by mouth 3 (three) times daily as needed for muscle spasms.    ferrous sulfate 325 (65 FE) MG tablet Take 325 mg by mouth daily with breakfast.    isosorbide mononitrate (IMDUR) 30 MG 24 hr tablet Take 1 tablet (30 mg total) by mouth daily. Qty: 90 tablet, Refills: 3  metoprolol succinate (TOPROL-XL) 25 MG 24 hr tablet Take 0.5 tablets (12.5 mg total) by mouth daily. Qty: 90 tablet, Refills: 3    Multiple Vitamins-Minerals (EMERGEN-C VITAMIN C PO) Take 1 tablet by mouth daily.    Polyvinyl Alcohol-Povidone (REFRESH OP) Place 1 drop into both eyes 2 (two) times daily.     Pseudoeph-Doxylamine-DM-APAP (NYQUIL MULTI-SYMPTOM PO) Take 1 capsule by mouth at bedtime as needed (for cold).    temazepam (RESTORIL) 15 MG capsule Take 15 mg by mouth at bedtime as needed for sleep. SLEEP AID    tiZANidine (ZANAFLEX) 2 MG tablet Take 2 mg by mouth every 6 (six) hours as needed for muscle spasms.    triamcinolone cream (KENALOG) 0.1 % Apply 1 application topically daily as needed (FOR [facility]).     nitroGLYCERIN (NITROSTAT) 0.4 MG SL tablet Place 1 tablet (0.4 mg total) under the tongue every 5 (five) minutes as needed for chest pain. Qty: 25 tablet, Refills: 1           Outstanding Labs/Studies     Duration of Discharge Encounter   Greater than 30 minutes including physician time.  Signed, Jacinta Penalver PAC 06/14/2015, 9:10 AM

## 2015-06-14 NOTE — Telephone Encounter (Signed)
Spoke to patient.She stated she feels better today no chest pain.Spoke to Dr.Jordan he advised ok to schedule appointment to discuss possible CTO.Appointment scheduled with Dr.Jordan 08/22/15 at 9:45 am.Stated she will talk to husband.Stated she will call back if she decides to cancel.

## 2015-06-14 NOTE — Progress Notes (Signed)
Iv removed. Reviewed discharge instructions. Awaiting discharge.  Easter Schinke, Charlaine Dalton RN

## 2015-07-02 ENCOUNTER — Ambulatory Visit: Payer: Medicare Other | Admitting: Physician Assistant

## 2015-07-09 ENCOUNTER — Telehealth: Payer: Self-pay | Admitting: Internal Medicine

## 2015-07-09 NOTE — Telephone Encounter (Signed)
Patient reports that she has had loose or watery stools for approximately 9 days.  She was treated by her primary care with Flagyl that she started last Thursday.  She reports that all of her stool studies have been negative.  She asks if she should take the antibiotics.  She is advised that she should complete all of the flagyl.  She wants to know if she needs to be seen.  She is advised that she should complete the antibiotics and if she still has diarrhea, call back for an office visit

## 2015-07-11 ENCOUNTER — Telehealth: Payer: Self-pay | Admitting: Cardiovascular Disease

## 2015-07-11 NOTE — Telephone Encounter (Signed)
I spoke with the [name] and she was diagnosed with cdiff last week and is being treated with Flagyl by Dr Johny Blamer.  The [name] wanted to make sure that this medication is okay for her to take.  I made her aware that this medicine is okay with her cardiac medications.

## 2015-07-11 NOTE — Telephone Encounter (Signed)
Follow up   Pt is calling for RN  i asked for more information   She said she is calling back because she cant get appt with her PCP  She said the medication Metronidacole 500mg  was written by her [facility] Doctor   And she wants to know if it is safe to continue to take

## 2015-08-08 ENCOUNTER — Other Ambulatory Visit: Payer: Self-pay | Admitting: Family Medicine

## 2015-08-08 DIAGNOSIS — N644 Mastodynia: Secondary | ICD-10-CM

## 2015-08-17 ENCOUNTER — Inpatient Hospital Stay: Admission: RE | Admit: 2015-08-17 | Payer: Medicare Other | Source: Ambulatory Visit

## 2015-08-17 ENCOUNTER — Other Ambulatory Visit: Payer: Medicare Other

## 2015-08-21 ENCOUNTER — Ambulatory Visit (INDEPENDENT_AMBULATORY_CARE_PROVIDER_SITE_OTHER): Payer: Medicare Other | Admitting: Cardiovascular Disease

## 2015-08-21 ENCOUNTER — Encounter: Payer: Self-pay | Admitting: Cardiovascular Disease

## 2015-08-21 VITALS — BP 110/70 | HR 60 | Ht 62.0 in | Wt 131.2 lb

## 2015-08-21 DIAGNOSIS — I25119 Atherosclerotic heart disease of native coronary artery with unspecified angina pectoris: Secondary | ICD-10-CM

## 2015-08-21 DIAGNOSIS — I10 Essential (primary) hypertension: Secondary | ICD-10-CM | POA: Diagnosis not present

## 2015-08-21 MED ORDER — METOPROLOL SUCCINATE ER 25 MG PO TB24: 12.5000 mg | ORAL_TABLET | Freq: Every day | ORAL | Status: DC

## 2015-08-21 MED ORDER — ISOSORBIDE MONONITRATE ER 30 MG PO TB24: 30.0000 mg | ORAL_TABLET | Freq: Every day | ORAL | Status: DC

## 2015-08-21 MED ORDER — CLOPIDOGREL BISULFATE 75 MG PO TABS: 75.0000 mg | ORAL_TABLET | Freq: Every day | ORAL | Status: DC

## 2015-08-21 NOTE — Patient Instructions (Signed)
Medication Instructions:  Your physician has recommended you make the following change in your medication:  1. STOP Amlodipine  Labwork: No new orders.   Testing/Procedures: No new orders.   Follow-Up: Your physician wants you to follow-up in: 6 MONTHS with Dr Excell Seltzer.  You will receive a reminder letter in the mail two months in advance. If you don't receive a letter, please call our office to schedule the follow-up appointment.   Any Other Special Instructions Will Be Listed Below (If Applicable).     If you need a refill on your cardiac medications before your next appointment, please call your pharmacy.

## 2015-08-21 NOTE — Progress Notes (Signed)
Cardiology Office Note Date:  08/21/2015   ID:  SUZANN LAZARO, DOB [birth year], MRN [id number]  PCP:  Cala Bradford, MD  Cardiologist:  Tonny Bollman, MD    Chief Complaint  Patient presents with  . Coronary Artery Disease    has some SOB but remains unchanged from previous visit. no cp,lee, or claudication  . essential hypertension     History of Present Illness: [name] is a 80 y.o. female who presents for follow-up of CAD. The patient presented with non-ST elevation infarction in 2016. She was noted to have severe stenosis in a PDA branch and PCI was attempted. This was complicated by localized dissection. Medical therapy was pursued. However, in February of this year she developed worsening exercise tolerance and dyspnea with low-level activity. A stress test showed ischemia in the PDA territory but also in the LAD territory. The stress test was high-risk and cardiac catheterization was done. Stem is rated total occlusion of the PDA. The LAD that was felt to have nonobstructive disease. PCI was attempted but failed because of balloon could not be passed across the total occlusion. The patient has been managed medically since that time. Films were reviewed with interventional colleagues who all agree that medical therapy was the best treatment option for this patient.  She returns today for follow-up evaluation. She has had C. difficile colitis. She continues to have generalized fatigue and shortness of breath with activity. She has not had any recent chest pain. She denies orthopnea, PND, or heart palpitations.   Past Medical History  Diagnosis Date  . Dyslipidemia     a. statin intolerant.  . IRON DEFICIENCY ANEMIA SECONDARY TO BLOOD LOSS   . CAD (coronary artery disease) 2006, 2016    a. 08/2014 NSTEMI: Attempted PCI of 85% RPDA - unable to wire, complicated by dissection, case aborted;  b. 08/2014 Relook Cath: LM nl, LAD 60p, SP1 90ost (small), D1 65ost, RI 45/50, RCA nl,  RPDA 90 - healed dissection;  b. 08/2014 Echo: EF 55%, mild/mon midapical inferior HK, mild AI, PASP . c. Cath 05/2015 with CTO of the right-PDA and unsuccessful PCI  . SUPRAVENTRICULAR TACHYCARDIA   . Raynaud's syndrome   . GERD   . GASTRIC ANTRAL VASCULAR ECTASIA   . ENDOMETRIOSIS   . SJOGREN'S SYNDROME   . Rosacea, acne   . COLONIC POLYPS, ADENOMATOUS, HX OF   . Essential hypertension   . Insomnia   . Sicca syndrome (HCC)   . Vitamin D deficiency   . Neuropathy (HCC)   . Diverticulitis   . chronic back   . Fibromyalgia   . Abnormal nuclear stress test 05/24/2015  . Myocardial infarction (HCC) 08/2014  . History of hiatal hernia   . Osteoarthritis     "back, hips, basically all over" (05/24/2015)  . CKD (chronic kidney disease), stage II     Past Surgical History  Procedure Laterality Date  . Hiatal hernia repair  2005    and paraesophageal  . Esophagogastroduodenoscopy  11/27/2009    APC tretment of lesions  . Colonoscopy w/ biopsies and polypectomy  12/17/2006    adenomatous polyps, external hemorrhoids  . Tear duct probing Right     "no plugs or anything"  . Breast lumpectomy with radioactive seed localization Right 01/24/2014    Procedure: BREAST LUMPECTOMY WITH RADIOACTIVE SEED LOCALIZATION;  Surgeon: Avel Peace, MD;  Location: [facility];  Service: General;  Laterality: Right;  . Hernia repair    .  Appendectomy    . Total abdominal hysterectomy      w/BSO  . Laparoscopic cholecystectomy    . Breast biopsy Bilateral     "both were negative"  . Breast lumpectomy Right X 2  . Cardiac catheterization N/A 08/18/2014    Procedure: Left Heart Cath and Coronary Angiography;  Surgeon: Marykay Lex, MD;  Location: [facility] INVASIVE CV LAB;  Service: Cardiovascular;  Laterality: N/A;  . Cardiac catheterization  08/18/2014    Procedure: Coronary Balloon Angioplasty;  Surgeon: Marykay Lex, MD;  Location: [facility] INVASIVE CV LAB;  Service: Cardiovascular;;  .  Cardiac catheterization N/A 08/21/2014    Procedure: Left Heart Cath and Coronary Angiography;  Surgeon: Marykay Lex, MD;  Location: [facility] INVASIVE CV LAB;  Service: Cardiovascular;  Laterality: N/A;  . Cardiac catheterization  2006  . Cardiac catheterization N/A 05/24/2015    Procedure: Left Heart Cath and Coronary Angiography;  Surgeon: Tonny Bollman, MD;  Location: [facility] INVASIVE CV LAB;  Service: Cardiovascular;  Laterality: N/A;  . Eye surgery      Current Outpatient Prescriptions  Medication Sig Dispense Refill  . acetaminophen (TYLENOL) 500 MG tablet Take 1,000 mg by mouth every 6 (six) hours as needed (pain).    [name] aspirin 81 MG tablet Take 81 mg by mouth daily.    . Biotin 10 MG TABS Take 10 mg by mouth daily.    . calcium carbonate (ANTACID) 420 MG CHEW chewable tablet Chew 420 mg by mouth daily.    . clopidogrel (PLAVIX) 75 MG tablet Take 1 tablet (75 mg total) by mouth daily with breakfast. 90 tablet 3  . cyclobenzaprine (FLEXERIL) 10 MG tablet Take 10 mg by mouth 3 (three) times daily as needed for muscle spasms.    . ferrous sulfate 325 (65 FE) MG tablet Take 325 mg by mouth daily with breakfast.    . isosorbide mononitrate (IMDUR) 30 MG 24 hr tablet Take 1 tablet (30 mg total) by mouth daily. 90 tablet 3  . metoprolol succinate (TOPROL-XL) 25 MG 24 hr tablet Take 0.5 tablets (12.5 mg total) by mouth daily. 90 tablet 3  . Multiple Vitamins-Minerals (EMERGEN-C VITAMIN C PO) Take 1 tablet by mouth daily.    . nitroGLYCERIN (NITROSTAT) 0.4 MG SL tablet Place 1 tablet (0.4 mg total) under the tongue every 5 (five) minutes as needed for chest pain. 25 tablet 1  . pantoprazole (PROTONIX) 40 MG tablet Take 40 mg by mouth daily as needed. Acid reflux    . Polyvinyl Alcohol-Povidone (REFRESH OP) Place 1 drop into both eyes 2 (two) times daily.     . Pseudoeph-Doxylamine-DM-APAP (NYQUIL MULTI-SYMPTOM PO) Take 1 capsule by mouth at bedtime as needed (for cold).    . temazepam (RESTORIL) 15 MG  capsule Take 15 mg by mouth at bedtime as needed for sleep. SLEEP AID    . tiZANidine (ZANAFLEX) 2 MG tablet Take 2 mg by mouth every 6 (six) hours as needed for muscle spasms.    [name] triamcinolone cream (KENALOG) 0.1 % Apply 1 application topically daily as needed (FOR [facility]).      No current facility-administered medications for this visit.    Allergies:   Ezetimibe; Ezetimibe-simvastatin; Morphine; Rosuvastatin; Ambien; Plaquenil; Polytrim; and Prevacid   Social History:  The patient  reports that she has never smoked. She has never used smokeless tobacco. She reports that she drinks alcohol. She reports that she does not use illicit drugs.   Family History:  The patient's  family history includes Breast cancer in her daughter, sister, sister, and sister; Cancer in her daughter and sister; Coronary artery disease in her brother, brother, brother, father, and mother; Diabetes in her mother; Heart attack in her father; Heart disease in her brother, father, and mother; Heart failure in her mother; Hyperlipidemia in her mother; Hypertension in her father and mother; Kidney failure in her mother; Peripheral vascular disease in her mother. There is no history of Colon cancer, Esophageal cancer, Stomach cancer, or Rectal cancer.    ROS:  Please see the history of present illness.  All other systems are reviewed and negative.    PHYSICAL EXAM: VS:  BP 110/70 mmHg  Pulse 60  Ht 5\' 2"  (1.575 m)  Wt 131 lb 3.2 oz (59.512 kg)  BMI 23.99 kg/m2 , BMI Body mass index is 23.99 kg/(m^2). GEN: Well nourished, well developed, in no acute distress HEENT: normal Neck: no JVD, no masses. No carotid bruits Cardiac: RRR without murmur or gallop                Respiratory:  clear to auscultation bilaterally, normal work of breathing GI: soft, nontender, nondistended, + BS MS: no deformity or atrophy Ext: no pretibial edema, pedal pulses 2+= bilaterally Skin: warm and dry, no rash Neuro:  Strength and  sensation are intact Psych: euthymic mood, full affect  EKG:  EKG is not ordered today.  Recent Labs: 06/13/2015: BUN 15; Creatinine, Ser 1.00; Hemoglobin 11.9*; Platelets 194; Potassium 4.8; Sodium 136   Lipid Panel     Component Value Date/Time   CHOL 162 08/19/2014 0255   TRIG 98 08/19/2014 0255   HDL 54 08/19/2014 0255   CHOLHDL 3.0 08/19/2014 0255   VLDL 20 08/19/2014 0255   LDLCALC 88 08/19/2014 0255      Wt Readings from Last 3 Encounters:  08/21/15 131 lb 3.2 oz (59.512 kg)  06/13/15 133 lb 6.4 oz (60.51 kg)  06/07/15 134 lb 1.9 oz (60.836 kg)     Cardiac Studies Reviewed: 2D Echo 05-17-2015: Study Conclusions  - Left ventricle: The cavity size was normal. Wall thickness was  normal. Systolic function was normal. The estimated ejection  fraction was in the range of 55% to 60%. Wall motion was normal;  there were no regional wall motion abnormalities. Doppler  parameters are consistent with abnormal left ventricular  relaxation (grade 1 diastolic dysfunction). - Aortic valve: There was mild stenosis. There was mild  regurgitation. Mean gradient (S): 8 mm Hg. Peak gradient (S): 16  mm Hg. - Mitral valve: Calcified annulus. Mildly thickened leaflets . - Pulmonary arteries: PA peak pressure: 34 mm Hg (S).  Myoview 05-17-2015: Study Highlights     The left ventricular ejection fraction is normal (55-65%).  Nuclear stress EF: 55%.  Blood pressure demonstrated a hypotensive response to exercise.  There was no ST segment deviation noted during stress.  No T wave inversion was noted during stress.  Defect 1: There is a large defect of moderate severity present in the basal inferolateral, mid inferolateral, apical inferior, apical lateral and apex location.  This is a high risk study.  Findings consistent with ischemia.  High risk stress nuclear study with ischemia in the territory of a known right coronary artery lesion, but also with ischemia in the  distribution of the distal LAD artery, new when compared to study from 2015. Normal left ventricular regional and global systolic function.   Cath 05-24-2015: Conclusion     Prox RCA lesion, 25%  stenosed.  Dist RCA lesion, 25% stenosed.  Prox Cx to Mid Cx lesion, 50% stenosed.  Prox LAD lesion, 50% stenosed.  Dist LAD lesion, 25% stenosed.  RPDA lesion, 100% stenosed. Post intervention, there is a 100% residual stenosis.  1. Severe single-vessel coronary artery disease with total occlusion of the right PDA, attempted PCI unsuccessful due to inability to cross lesion  2. Heavily calcified coronary arteries with mild to moderate diffuse CAD involving the LAD and left circumflex, appropriate for medical therapy  3. Normal LV function by noninvasive assessment  Recommendations: We'll keep for observation overnight, anticipate discharge home tomorrow morning. Options include ongoing medical therapy versus another attempt at CTO-PCI. Will review with interventional colleagues and consider referral to Dr. [place] or Eldridge Dace.    ASSESSMENT AND PLAN: 1.  Coronary artery disease, native vessel: No angina at present. Last cardiac cath study reviewed. Medical program reviewed. Considering her fatigue and episodic dizziness in the context of low normal blood pressure, I think it is best to discontinue amlodipine. She will otherwise continue on her antianginal regimen without further changes.  2. Essential hypertension: As above, blood pressure is currently well controlled and we will discontinue amlodipine.  3. Dyslipidemia: The patient is intolerant to statins and Zetia. Continue with lifestyle modification.   Current medicines are reviewed with the patient today.  The patient does not have concerns regarding medicines.  Labs/ tests ordered today include:  No orders of the defined types were placed in this encounter.    Disposition:   FU 6 months  Signed, Tonny Bollman, MD    08/21/2015 3:02 PM    [facility] [address] [place], [place], [place]  [address] Phone: [phone]; Fax: [phone]

## 2015-08-22 ENCOUNTER — Ambulatory Visit
Admission: RE | Admit: 2015-08-22 | Discharge: 2015-08-22 | Disposition: A | Discharge status: Home / Self Care | Payer: Medicare Other | Source: Ambulatory Visit | Attending: Family Medicine | Admitting: Family Medicine

## 2015-08-22 ENCOUNTER — Ambulatory Visit: Payer: Medicare Other | Admitting: Cardiology

## 2015-08-22 ENCOUNTER — Other Ambulatory Visit: Payer: Self-pay | Admitting: Family Medicine

## 2015-08-22 DIAGNOSIS — M79605 Pain in left leg: Secondary | ICD-10-CM

## 2015-10-03 ENCOUNTER — Observation Stay (HOSPITAL_COMMUNITY)
Admission: EM | Admit: 2015-10-03 | Discharge: 2015-10-04 | Disposition: A | Discharge status: Home / Self Care | Payer: Medicare Other | Attending: Cardiovascular Disease | Admitting: Cardiovascular Disease

## 2015-10-03 ENCOUNTER — Encounter (HOSPITAL_COMMUNITY): Payer: Self-pay | Admitting: Emergency Medicine

## 2015-10-03 ENCOUNTER — Emergency Department (HOSPITAL_COMMUNITY): Payer: Medicare Other

## 2015-10-03 DIAGNOSIS — I2510 Atherosclerotic heart disease of native coronary artery without angina pectoris: Secondary | ICD-10-CM

## 2015-10-03 DIAGNOSIS — E871 Hypo-osmolality and hyponatremia: Secondary | ICD-10-CM | POA: Insufficient documentation

## 2015-10-03 DIAGNOSIS — I129 Hypertensive chronic kidney disease with stage 1 through stage 4 chronic kidney disease, or unspecified chronic kidney disease: Secondary | ICD-10-CM | POA: Insufficient documentation

## 2015-10-03 DIAGNOSIS — Z8249 Family history of ischemic heart disease and other diseases of the circulatory system: Secondary | ICD-10-CM | POA: Insufficient documentation

## 2015-10-03 DIAGNOSIS — N182 Chronic kidney disease, stage 2 (mild): Secondary | ICD-10-CM | POA: Insufficient documentation

## 2015-10-03 DIAGNOSIS — E785 Hyperlipidemia, unspecified: Secondary | ICD-10-CM | POA: Diagnosis present

## 2015-10-03 DIAGNOSIS — I701 Atherosclerosis of renal artery: Secondary | ICD-10-CM | POA: Diagnosis not present

## 2015-10-03 DIAGNOSIS — I252 Old myocardial infarction: Secondary | ICD-10-CM | POA: Insufficient documentation

## 2015-10-03 DIAGNOSIS — Z79899 Other long term (current) drug therapy: Secondary | ICD-10-CM | POA: Diagnosis not present

## 2015-10-03 DIAGNOSIS — Z9861 Coronary angioplasty status: Secondary | ICD-10-CM

## 2015-10-03 DIAGNOSIS — R778 Other specified abnormalities of plasma proteins: Secondary | ICD-10-CM | POA: Diagnosis present

## 2015-10-03 DIAGNOSIS — R079 Chest pain, unspecified: Principal | ICD-10-CM

## 2015-10-03 DIAGNOSIS — G629 Polyneuropathy, unspecified: Secondary | ICD-10-CM | POA: Diagnosis not present

## 2015-10-03 DIAGNOSIS — A0472 Enterocolitis due to Clostridium difficile, not specified as recurrent: Secondary | ICD-10-CM | POA: Diagnosis present

## 2015-10-03 DIAGNOSIS — Z7982 Long term (current) use of aspirin: Secondary | ICD-10-CM | POA: Insufficient documentation

## 2015-10-03 DIAGNOSIS — I10 Essential (primary) hypertension: Secondary | ICD-10-CM

## 2015-10-03 DIAGNOSIS — Z7902 Long term (current) use of antithrombotics/antiplatelets: Secondary | ICD-10-CM | POA: Insufficient documentation

## 2015-10-03 DIAGNOSIS — I4510 Unspecified right bundle-branch block: Secondary | ICD-10-CM | POA: Diagnosis present

## 2015-10-03 DIAGNOSIS — R7989 Other specified abnormal findings of blood chemistry: Secondary | ICD-10-CM | POA: Diagnosis present

## 2015-10-03 HISTORY — DX: Enterocolitis due to Clostridium difficile, not specified as recurrent: A04.72

## 2015-10-03 HISTORY — DX: Essential (primary) hypertension: I10

## 2015-10-03 HISTORY — DX: Other specified abnormal findings of blood chemistry: R79.89

## 2015-10-03 LAB — I-STAT TROPONIN, ED: TROPONIN I, POC: 0 ng/mL (ref 0.00–0.08)

## 2015-10-03 LAB — BASIC METABOLIC PANEL
ANION GAP: 7 (ref 5–15)
BUN: 13 mg/dL (ref 6–20)
CALCIUM: 10.2 mg/dL (ref 8.9–10.3)
CO2: 25 mmol/L (ref 22–32)
CREATININE: 0.87 mg/dL (ref 0.44–1.00)
Chloride: 97 mmol/L — ABNORMAL LOW (ref 101–111)
Glucose, Bld: 93 mg/dL (ref 65–99)
Potassium: 4.3 mmol/L (ref 3.5–5.1)
SODIUM: 129 mmol/L — AB (ref 135–145)

## 2015-10-03 LAB — CBC
HCT: 36.6 % (ref 36.0–46.0)
HEMOGLOBIN: 12 g/dL (ref 12.0–15.0)
MCH: 27.5 pg (ref 26.0–34.0)
MCHC: 32.8 g/dL (ref 30.0–36.0)
MCV: 83.9 fL (ref 78.0–100.0)
Platelets: 210 10*3/uL (ref 150–400)
RBC: 4.36 MIL/uL (ref 3.87–5.11)
RDW: 14.6 % (ref 11.5–15.5)
WBC: 5.6 10*3/uL (ref 4.0–10.5)

## 2015-10-03 LAB — TROPONIN I
TROPONIN I: 0.07 ng/mL — AB (ref <0.03)
Troponin I: 0.05 ng/mL (ref <0.03)
Troponin I: 0.1 ng/mL (ref <0.03)

## 2015-10-03 MED ORDER — NITROGLYCERIN 0.4 MG SL SUBL: 0.4000 mg | SUBLINGUAL_TABLET | SUBLINGUAL | Status: DC | PRN

## 2015-10-03 MED ORDER — ASPIRIN 325 MG PO TABS
325.0000 mg | ORAL_TABLET | Freq: Once | ORAL | Status: AC
  Administered 2015-10-03: 325 mg via ORAL
  Filled 2015-10-03: qty 1

## 2015-10-03 MED ORDER — CYCLOBENZAPRINE HCL 10 MG PO TABS: 10.0000 mg | ORAL_TABLET | Freq: Three times a day (TID) | ORAL | Status: DC | PRN

## 2015-10-03 MED ORDER — PANTOPRAZOLE SODIUM 40 MG PO TBEC
40.0000 mg | DELAYED_RELEASE_TABLET | Freq: Every day | ORAL | Status: DC
  Administered 2015-10-03 – 2015-10-04 (×2): 40 mg via ORAL
  Filled 2015-10-03 (×2): qty 1

## 2015-10-03 MED ORDER — ASPIRIN 81 MG PO CHEW
81.0000 mg | CHEWABLE_TABLET | Freq: Every day | ORAL | Status: DC
  Administered 2015-10-03 – 2015-10-04 (×2): 81 mg via ORAL
  Filled 2015-10-03 (×2): qty 1

## 2015-10-03 MED ORDER — VANCOMYCIN 50 MG/ML ORAL SOLUTION
125.0000 mg | Freq: Four times a day (QID) | ORAL | Status: DC
  Administered 2015-10-03 – 2015-10-04 (×4): 125 mg via ORAL
  Filled 2015-10-03 (×7): qty 2.5

## 2015-10-03 MED ORDER — ACETAMINOPHEN 325 MG PO TABS: 650.0000 mg | ORAL_TABLET | ORAL | Status: DC | PRN

## 2015-10-03 MED ORDER — CLOPIDOGREL BISULFATE 75 MG PO TABS
75.0000 mg | ORAL_TABLET | Freq: Every day | ORAL | Status: DC
  Administered 2015-10-03 – 2015-10-04 (×2): 75 mg via ORAL
  Filled 2015-10-03 (×3): qty 1

## 2015-10-03 MED ORDER — ISOSORBIDE MONONITRATE ER 30 MG PO TB24
30.0000 mg | ORAL_TABLET | Freq: Every day | ORAL | Status: DC
  Administered 2015-10-03 – 2015-10-04 (×2): 30 mg via ORAL
  Filled 2015-10-03 (×2): qty 1

## 2015-10-03 MED ORDER — CALCIUM CARBONATE ANTACID 500 MG PO CHEW
2.0000 | CHEWABLE_TABLET | Freq: Every day | ORAL | Status: DC
  Administered 2015-10-03 – 2015-10-04 (×2): 400 mg via ORAL
  Filled 2015-10-03 (×2): qty 2

## 2015-10-03 MED ORDER — FERROUS SULFATE 325 (65 FE) MG PO TABS
325.0000 mg | ORAL_TABLET | Freq: Every day | ORAL | Status: DC
  Administered 2015-10-03 – 2015-10-04 (×2): 325 mg via ORAL
  Filled 2015-10-03 (×3): qty 1

## 2015-10-03 MED ORDER — TEMAZEPAM 15 MG PO CAPS: 15.0000 mg | ORAL_CAPSULE | Freq: Every evening | ORAL | Status: DC | PRN

## 2015-10-03 MED ORDER — BIOTIN 10 MG PO TABS: 10.0000 mg | ORAL_TABLET | Freq: Every day | ORAL | Status: DC

## 2015-10-03 MED ORDER — EMERGEN-C VITAMIN C PO PACK: PACK | Freq: Every day | ORAL | Status: DC

## 2015-10-03 MED ORDER — ACETAMINOPHEN 500 MG PO TABS: 1000.0000 mg | ORAL_TABLET | Freq: Four times a day (QID) | ORAL | Status: DC | PRN

## 2015-10-03 MED ORDER — METOPROLOL SUCCINATE ER 25 MG PO TB24
12.5000 mg | ORAL_TABLET | Freq: Every day | ORAL | Status: DC
  Administered 2015-10-03 – 2015-10-04 (×2): 12.5 mg via ORAL
  Filled 2015-10-03 (×2): qty 1

## 2015-10-03 MED ORDER — ONDANSETRON HCL 4 MG/2ML IJ SOLN: 4.0000 mg | Freq: Four times a day (QID) | INTRAMUSCULAR | Status: DC | PRN

## 2015-10-03 MED ORDER — TIZANIDINE HCL 2 MG PO TABS
2.0000 mg | ORAL_TABLET | Freq: Four times a day (QID) | ORAL | Status: DC | PRN
  Filled 2015-10-03: qty 1

## 2015-10-03 NOTE — ED Notes (Signed)
Pt. reports central chest tightness/pressure with SOB and nausea onset last night , denies emesis or diaphoresis , pt. added elevated blood pressure today = 220/171.

## 2015-10-03 NOTE — Progress Notes (Signed)
CRITICAL VALUE ALERT  Critical value received:  Troponin 0.05  Date of notification:  10/03/2015  Time of notification:  0820  Critical value read back:Yes.    Nurse who received alert:  K. Karleen Hampshire, RN  MD notified (1st page):  Cardmaster paged  Time of first page:  0830  Responding MD:  [place] [facility]  Time MD responded:  [phone]

## 2015-10-03 NOTE — Progress Notes (Signed)
Spoke with Infectious Disease and since pt recently dx and being treated for CDIFF within last 30 days she should be on enteric precautions without need for sample to be tested. Precautions ordered.

## 2015-10-03 NOTE — Care Management Obs Status (Signed)
MEDICARE OBSERVATION STATUS NOTIFICATION   Patient Details  Name: [name] MRN: [id number] Date of Birth: 14-Jan-1936   Medicare Observation Status Notification Given:  Yes    Darrold Span, RN 10/03/2015, 10:53 AM

## 2015-10-03 NOTE — Progress Notes (Signed)
PA [place] pgd about troponin level of 0.1. Will notify Tenny Craw, MD and notify RN if any changes in care.

## 2015-10-03 NOTE — ED Notes (Signed)
Cardiology at bedside.

## 2015-10-03 NOTE — Progress Notes (Signed)
Patient Name: [name] Date of Encounter: 10/03/2015   SUBJECTIVE   No further chest pain or sob. Recently diagnosed with outpatient C.diff and taking po vancomycin.   CURRENT MEDS . aspirin  81 mg Oral Daily  . calcium carbonate  2 tablet Oral Daily  . clopidogrel  75 mg Oral Q breakfast  . ferrous sulfate  325 mg Oral Q breakfast  . isosorbide mononitrate  30 mg Oral Daily  . metoprolol succinate  12.5 mg Oral Daily  . pantoprazole  40 mg Oral Daily  . vancomycin  125 mg Oral Q6H    OBJECTIVE  Filed Vitals:   10/03/15 0430 10/03/15 0500 10/03/15 0618 10/03/15 1058  BP: 132/61 126/55 155/98 143/56  Pulse: 75 69 74 68  Temp:   98 F (36.7 C)   TempSrc:   Oral   Resp: 13 15 16    SpO2: 95% 95% 97%    No intake or output data in the 24 hours ending 10/03/15 1147 There were no vitals filed for this visit.  PHYSICAL EXAM  General: Pleasant, NAD. Neuro: Alert and oriented X 3. Moves all extremities spontaneously. Psych: Normal affect. HEENT:  Normal  Neck: Supple without bruits or JVD. Lungs:  Resp regular and unlabored, CTA. Heart: RRR no s3, s4, or murmurs. Abdomen: Soft, non-tender, non-distended, BS + x 4.  Extremities: No clubbing, cyanosis or edema. DP/PT/Radials 2+ and equal bilaterally.  Accessory Clinical Findings  CBC  Recent Labs  10/03/15 0150  WBC 5.6  HGB 12.0  HCT 36.6  MCV 83.9  PLT 210   Basic Metabolic Panel  Recent Labs  10/03/15 0150  NA 129*  K 4.3  CL 97*  CO2 25  GLUCOSE 93  BUN 13  CREATININE 0.87  CALCIUM 10.2   Liver Function Tests No results for input(s): AST, ALT, ALKPHOS, BILITOT, PROT, ALBUMIN in the last 72 hours. No results for input(s): LIPASE, AMYLASE in the last 72 hours. Cardiac Enzymes  Recent Labs  10/03/15 0711  TROPONINI 0.05*   BNP Invalid input(s): POCBNP D-Dimer No results for input(s): DDIMER in the last 72 hours. Hemoglobin A1C No results for input(s): HGBA1C in the last 72  hours. Fasting Lipid Panel No results for input(s): CHOL, HDL, LDLCALC, TRIG, CHOLHDL, LDLDIRECT in the last 72 hours. Thyroid Function Tests No results for input(s): TSH, T4TOTAL, T3FREE, THYROIDAB in the last 72 hours.  Invalid input(s): FREET3  TELE  Sinus rhythm   Radiology/Studies  Dg Chest 2 View  10/03/2015  CLINICAL DATA:  Chest tightness EXAM: CHEST  2 VIEW COMPARISON:  06/13/2015 FINDINGS: Normal heart size and mediastinal contours. Aortic atherosclerosis. There is no edema, consolidation, effusion, or pneumothorax. No acute osseous finding. Cholecystectomy clips. IMPRESSION: No evidence of active disease. Electronically Signed   By: Marnee Spring M.D.   On: 10/03/2015 02:22    ASSESSMENT AND PLAN  [name] is a 80 y.o. female with a PMH significant for HTN, dyslipidemia with intolerance of statin therapy, and CAD with known CTO of the PDA who presented with chest discomfort,  left arm numbness and fluctuating BP . Stats that BP was 190/110 at home.   1. Uncontrolled HTN - BP of 190/110 at home. Reason for presentation. BP og 156/122 at presentation. Now improved to 143/56. Echo 05/17/15 with EF of  55-60%. Amlodipine recently d/c at outpatient visit  2. Chest pain  - Atypical. Troponin 0.05. Continue to trend. Chest pain free currently   3. CAD -  known CAD with CTO of the PDA. Continue ASA, plavix and  BB.   4.  Dyslipidemia -  The patient is intolerant to statins and Zetia. Continue with lifestyle modification.       Signed, Bhagat,Bhavinkumar PA-C Pager [phone]  Pt seen and examined  Agree with findings of B Bhagat above   Pt currently denies CP  Just feels "blah"  Sl nauseated earlier  ON exam;  Lungs CTA  Cardiac RRR  NO S3 or murmurs Ext with triv edema PT with known CTO  IN setting of HTN out of control could have some ischemia  I am not convinced of new / active symptoms   WIll continue to follow troponin trend as well as BP  BP is better since  yesterday.  Discuss with M Edwena Felty

## 2015-10-03 NOTE — H&P (Signed)
Patient ID: [name] MRN: [id number], DOB/AGE: October 14, 1935   Admit date: 10/03/2015  Requesting Physician: Primary Physician: Cala Bradford, MD Primary Cardiologist: Excell Seltzer Reason for admission: Chest Pain  Pt. Profile:  [name] is a 80 y.o. female with a PMH significant for HTN, dyslipidemia with intolerance of statin therapy, and CAD with known CTO of the PDA who presented with chest discomfort and left arm numbness.   The pt had been in her normal state of health over recent days without exertional CP or CP at rest when she awoke at 1am with mild substernal chest discomfort.  She is unable to further characterize the discomfort, but states that it was not a pressure, heaviness, or sharp sensation and was non-pleuritic.  It lasted for approximately 15 mins before self resolving, and was accompanied by mild nausea.  She also noted left arm numbness that persisted for approximately 1.5H before resolving.  She is currently resting comfortably in bed and is asymptomatic.  She does note that she took her BP with an automated cuff during the event and SBP=190s.  She has been normotensive since arrival in the ED without medical therapy.  Ms. Pion has been taking her medications as prescribed over recent days.  She has no other acute complaints.  Problem List  Past Medical History  Diagnosis Date  . Dyslipidemia     a. statin intolerant.  . IRON DEFICIENCY ANEMIA SECONDARY TO BLOOD LOSS   . CAD (coronary artery disease) 2006, 2016    a. 08/2014 NSTEMI: Attempted PCI of 85% RPDA - unable to wire, complicated by dissection, case aborted;  b. 08/2014 Relook Cath: LM nl, LAD 60p, SP1 90ost (small), D1 65ost, RI 45/50, RCA nl, RPDA 90 - healed dissection;  b. 08/2014 Echo: EF 55%, mild/mon midapical inferior HK, mild AI, PASP . c. Cath 05/2015 with CTO of the right-PDA and unsuccessful PCI  . SUPRAVENTRICULAR TACHYCARDIA   . Raynaud's syndrome   . GERD   . GASTRIC ANTRAL VASCULAR  ECTASIA   . ENDOMETRIOSIS   . SJOGREN'S SYNDROME   . Rosacea, acne   . COLONIC POLYPS, ADENOMATOUS, HX OF   . Essential hypertension   . Insomnia   . Sicca syndrome (HCC)   . Vitamin D deficiency   . Neuropathy (HCC)   . Diverticulitis   . chronic back   . Fibromyalgia   . Abnormal nuclear stress test 05/24/2015  . Myocardial infarction (HCC) 08/2014  . History of hiatal hernia   . Osteoarthritis     "back, hips, basically all over" (05/24/2015)  . CKD (chronic kidney disease), stage II     Past Surgical History  Procedure Laterality Date  . Hiatal hernia repair  2005    and paraesophageal  . Esophagogastroduodenoscopy  11/27/2009    APC tretment of lesions  . Colonoscopy w/ biopsies and polypectomy  12/17/2006    adenomatous polyps, external hemorrhoids  . Tear duct probing Right     "no plugs or anything"  . Breast lumpectomy with radioactive seed localization Right 01/24/2014    Procedure: BREAST LUMPECTOMY WITH RADIOACTIVE SEED LOCALIZATION;  Surgeon: Avel Peace, MD;  Location: [facility];  Service: General;  Laterality: Right;  . Hernia repair    . Appendectomy    . Total abdominal hysterectomy      w/BSO  . Laparoscopic cholecystectomy    . Breast biopsy Bilateral     "both were negative"  . Breast lumpectomy Right X  2  . Cardiac catheterization N/A 08/18/2014    Procedure: Left Heart Cath and Coronary Angiography;  Surgeon: Marykay Lex, MD;  Location: [facility] INVASIVE CV LAB;  Service: Cardiovascular;  Laterality: N/A;  . Cardiac catheterization  08/18/2014    Procedure: Coronary Balloon Angioplasty;  Surgeon: Marykay Lex, MD;  Location: [facility] INVASIVE CV LAB;  Service: Cardiovascular;;  . Cardiac catheterization N/A 08/21/2014    Procedure: Left Heart Cath and Coronary Angiography;  Surgeon: Marykay Lex, MD;  Location: [facility] INVASIVE CV LAB;  Service: Cardiovascular;  Laterality: N/A;  . Cardiac catheterization  2006  . Cardiac catheterization N/A  05/24/2015    Procedure: Left Heart Cath and Coronary Angiography;  Surgeon: Tonny Bollman, MD;  Location: [facility] INVASIVE CV LAB;  Service: Cardiovascular;  Laterality: N/A;  . Eye surgery       Allergies  Allergies  Allergen Reactions  . Ezetimibe Other (See Comments)    Muscle pain= zetia  . Ezetimibe-Simvastatin Other (See Comments)    Muscle pain= vytorin  . Morphine Other (See Comments)    nightmares  . Rosuvastatin Other (See Comments)    Muscle pain  . Ambien [Zolpidem Tartrate] Other (See Comments)    Fell down  . Plaquenil [Hydroxychloroquine Sulfate] Other (See Comments)    Doesn't recall reaction  . Polytrim [Polymyxin B-Trimethoprim] Other (See Comments)    unknown  . Prevacid [Lansoprazole] Other (See Comments)    dizziness     Home Medications  Prior to Admission medications   Medication Sig Start Date End Date Taking? Authorizing Provider  acetaminophen (TYLENOL) 500 MG tablet Take 1,000 mg by mouth every 6 (six) hours as needed (pain).   Yes Historical Provider, MD  aspirin 81 MG tablet Take 81 mg by mouth daily.   Yes Historical Provider, MD  Biotin 10 MG TABS Take 10 mg by mouth daily.   Yes Historical Provider, MD  calcium carbonate (ANTACID) 420 MG CHEW chewable tablet Chew 420 mg by mouth daily.   Yes Historical Provider, MD  clopidogrel (PLAVIX) 75 MG tablet Take 1 tablet (75 mg total) by mouth daily with breakfast. 08/21/15  Yes Tonny Bollman, MD  cyclobenzaprine (FLEXERIL) 10 MG tablet Take 10 mg by mouth 3 (three) times daily as needed for muscle spasms.   Yes Historical Provider, MD  ferrous sulfate 325 (65 FE) MG tablet Take 325 mg by mouth daily with breakfast.   Yes Historical Provider, MD  isosorbide mononitrate (IMDUR) 30 MG 24 hr tablet Take 1 tablet (30 mg total) by mouth daily. 08/21/15  Yes Tonny Bollman, MD  metoprolol succinate (TOPROL-XL) 25 MG 24 hr tablet Take 0.5 tablets (12.5 mg total) by mouth daily. 08/21/15  Yes Tonny Bollman, MD    Multiple Vitamins-Minerals (EMERGEN-C VITAMIN C PO) Take 1 tablet by mouth daily.   Yes Historical Provider, MD  nitroGLYCERIN (NITROSTAT) 0.4 MG SL tablet Place 1 tablet (0.4 mg total) under the tongue every 5 (five) minutes as needed for chest pain. 06/12/15  Yes Tonny Bollman, MD  pantoprazole (PROTONIX) 40 MG tablet Take 40 mg by mouth daily. Acid reflux 06/21/15  Yes Historical Provider, MD  Polyvinyl Alcohol-Povidone (REFRESH OP) Place 1 drop into both eyes 2 (two) times daily.    Yes Historical Provider, MD  Pseudoeph-Doxylamine-DM-APAP (NYQUIL MULTI-SYMPTOM PO) Take 1 capsule by mouth at bedtime as needed (for cold).   Yes Historical Provider, MD  temazepam (RESTORIL) 15 MG capsule Take 15 mg by mouth at bedtime as needed for  sleep. SLEEP AID 08/03/13  Yes Historical Provider, MD  tiZANidine (ZANAFLEX) 2 MG tablet Take 2 mg by mouth every 6 (six) hours as needed for muscle spasms.   Yes Historical Provider, MD  vancomycin (VANCOCIN) 125 MG capsule Take 125 mg by mouth every 6 (six) hours. 09/21/15  Yes Historical Provider, MD    Family History  Family History  Problem Relation Age of Onset  . Heart failure Mother   . Diabetes Mother   . Kidney failure Mother   . Hypertension Mother   . Coronary artery disease Mother   . Heart disease Mother   . Hyperlipidemia Mother   . Peripheral vascular disease Mother     amputation  . Breast cancer Sister     x 3 sisters  . Cancer Sister   . Heart disease Father   . Hypertension Father   . Coronary artery disease Father   . Heart attack Father   . Breast cancer Daughter   . Cancer Daughter   . Colon cancer Neg Hx   . Esophageal cancer Neg Hx   . Stomach cancer Neg Hx   . Rectal cancer Neg Hx   . Coronary artery disease Brother   . Heart disease Brother   . Coronary artery disease Brother   . Coronary artery disease Brother   . Breast cancer Sister   . Breast cancer Sister    Family Status  Relation Status Death Age  . Mother  Deceased 25    CAD  . Sister Deceased 69  . Father Deceased   . Brother Alive     CABG  . Brother Alive   . Brother Alive     Fuchs Dystrophy  . Sister Alive   . Sister Alive     Fuchs Dystrophy  . Maternal Grandmother Deceased   . Maternal Grandfather Deceased 66  . Paternal Grandmother Deceased     CAD  . Paternal Grandfather Deceased     drown  . Sister Alive      Social History  Social History   Social History  . Marital Status: Married    Spouse Name: N/A  . Number of Children: 3  . Years of Education: college   Occupational History  . retired    Social History Main Topics  . Smoking status: Never Smoker   . Smokeless tobacco: Never Used  . Alcohol Use: Yes  . Drug Use: No  . Sexual Activity: Not Currently   Other Topics Concern  . Not on file   Social History Narrative   Patient drinks caffeine occasionally.   Patient is right handed.        Review of Systems General:  No chills, fever, night sweats or weight changes.  Cardiovascular:  No chest pain, dyspnea on exertion, edema, orthopnea, palpitations, paroxysmal nocturnal dyspnea. Dermatological: No rash, lesions/masses Respiratory: No cough, dyspnea Urologic: No hematuria, dysuria Abdominal:   No nausea, vomiting, diarrhea, bright red blood per rectum, melena, or hematemesis Neurologic:  No visual changes, wkns, changes in mental status. All other systems reviewed and are otherwise negative except as noted above.  Physical Exam  Blood pressure 126/55, pulse 69, temperature 98.1 F (36.7 C), temperature source Oral, resp. rate 15, SpO2 95 %.  General: Pleasant, NAD, AAOx3 Psych: Normal affect. Neuro: Moves all extremities spontaneously, no focal deficits HEENT: NCAT  Neck: Supple without bruits or JVD. Lungs:  CTAB, no w/r/c Heart: RRR, +S1 +S2, no m/r/g, no JVD, no LE edema Abdomen:  Soft, non-tender, non-distended, BS + x 4.  Extremities: No clubbing, cyanosis or edema. DP/PT/Radials 2+  and equal bilaterally.  Labs  No results for input(s): CKTOTAL, CKMB, TROPONINI in the last 72 hours. Lab Results  Component Value Date   WBC 5.6 10/03/2015   HGB 12.0 10/03/2015   HCT 36.6 10/03/2015   MCV 83.9 10/03/2015   PLT 210 10/03/2015    Recent Labs Lab 10/03/15 0150  NA 129*  K 4.3  CL 97*  CO2 25  BUN 13  CREATININE 0.87  CALCIUM 10.2  GLUCOSE 93   Lab Results  Component Value Date   CHOL 162 08/19/2014   HDL 54 08/19/2014   LDLCALC 88 08/19/2014   TRIG 98 08/19/2014   No results found for: DDIMER   Radiology/Studies  Dg Chest 2 View  10/03/2015  CLINICAL DATA:  Chest tightness EXAM: CHEST  2 VIEW COMPARISON:  06/13/2015 FINDINGS: Normal heart size and mediastinal contours. Aortic atherosclerosis. There is no edema, consolidation, effusion, or pneumothorax. No acute osseous finding. Cholecystectomy clips. IMPRESSION: No evidence of active disease. Electronically Signed   By: Marnee Spring M.D.   On: 10/03/2015 02:22    ECG: per my review, NSR with normal axis and normal intervals, early R-S transition, no pathologic Qwaves, no ST/Twave changes indicative of ischemia  TTE: 05/17/15 - Left ventricle: The cavity size was normal. Wall thickness was  normal. Systolic function was normal. The estimated ejection  fraction was in the range of 55% to 60%. Wall motion was normal;  there were no regional wall motion abnormalities. Doppler  parameters are consistent with abnormal left ventricular  relaxation (grade 1 diastolic dysfunction). - Aortic valve: There was mild stenosis. There was mild  regurgitation. Mean gradient (S): 8 mm Hg. Peak gradient (S): 16  mm Hg. - Mitral valve: Calcified annulus. Mildly thickened leaflets . - Pulmonary arteries: PA peak pressure: 34 mm Hg (S).  Coronary Angiography (05/24/15) Conclusion     Prox RCA lesion, 25% stenosed.  Dist RCA lesion, 25% stenosed.  Prox Cx to Mid Cx lesion, 50% stenosed.  Prox LAD  lesion, 50% stenosed.  Dist LAD lesion, 25% stenosed.  RPDA lesion, 100% stenosed. Post intervention, there is a 100% residual stenosis.    ASSESSMENT AND PLAN [name] is a 80 y.o. female with a PMH significant for HTN, dyslipidemia with intolerance of statin therapy, and CAD with known CTO of the PDA who presented with chest discomfort and left arm numbness. The pt's discomfort was atypical and relatively brief, along the associated arm numbness persisted for a longer period prompting the ED to request observation on the cardiology service and serial cardiac enzymes.  First troponin is negative and ECG is unchanged from baseline and without ischemic changes.  The pt is otherwise hemodynamically stable, and her BP has normalized after recording elevated BP at home prior to arrival.  There is low clinical suspicion for PE, PTX, or other acute pathology.  1) CP; known CAD with CTO of the PDA, CP is atypcial and of lower clinical suspicion for a cardiac etiology at this time and so will defer systemic anticoagulation.  If second troponin is positive then would start treatment dose enoxaparin and consider repeat coronary angiography - defer enoxaparin for now - continue ASA 81mg  PO QDAY - continue Clopidogrel 75mg  PO QDAY - Metoprolol succinate 12.5mg  PO QDAY - serial troponin draws - repeat ECG if CP recurs - continuous telemetry  2) HTN; BP elevated prior to  arrival per pt but stable since presentation - Metoprolol as per above - Amlodipine recently d/c at outpatient visit, continue to hold  3) Dyslipidemia; known statin intolerance, currently diet controlled  4) Hyponatremia; Na=129 from a baseline in the 130s - appears euvolemic on exam, no urinary symptoms, defer further w/u for now    Signed, Azalee Course, MD 10/03/2015, 5:31 AM

## 2015-10-03 NOTE — ED Notes (Signed)
EDP at bedside  

## 2015-10-03 NOTE — ED Provider Notes (Signed)
CSN: [id number]     Arrival date & time 10/03/15  0136 History  By signing my name below, I, Bridgette Habermann, attest that this documentation has been prepared under the direction and in the presence of Azalia Bilis, MD. Electronically Signed: Bridgette Habermann, ED Scribe. 10/03/2015. 3:36 AM.   Chief Complaint  Patient presents with  . Chest Pain  . Hypertension   The history is provided by the patient. No language interpreter was used.    HPI Comments: [name] is a 80 y.o. female with h/o CAD, GERD, HTN, and MI who presents to the Emergency Department complaining of chest tightness onset one day ago. Pt also has associated indigestion, back pain, left arm numbness, shortness of breath and nausea. Pt woke up with her symptoms. Pt also notes she has elevated BP of 193/100 and 220/171. Pt states that the chest tightness and left arm numbness are still present but all other symptoms are resolved at this time. Pt has h/o MI in June 2017. Pt denies cough, congestion, fever, vomiting, jaw pain, and diaphoresis.   Past Medical History  Diagnosis Date  . Dyslipidemia     a. statin intolerant.  . IRON DEFICIENCY ANEMIA SECONDARY TO BLOOD LOSS   . CAD (coronary artery disease) 2006, 2016    a. 08/2014 NSTEMI: Attempted PCI of 85% RPDA - unable to wire, complicated by dissection, case aborted;  b. 08/2014 Relook Cath: LM nl, LAD 60p, SP1 90ost (small), D1 65ost, RI 45/50, RCA nl, RPDA 90 - healed dissection;  b. 08/2014 Echo: EF 55%, mild/mon midapical inferior HK, mild AI, PASP . c. Cath 05/2015 with CTO of the right-PDA and unsuccessful PCI  . SUPRAVENTRICULAR TACHYCARDIA   . Raynaud's syndrome   . GERD   . GASTRIC ANTRAL VASCULAR ECTASIA   . ENDOMETRIOSIS   . SJOGREN'S SYNDROME   . Rosacea, acne   . COLONIC POLYPS, ADENOMATOUS, HX OF   . Essential hypertension   . Insomnia   . Sicca syndrome (HCC)   . Vitamin D deficiency   . Neuropathy (HCC)   . Diverticulitis   . chronic back   . Fibromyalgia    . Abnormal nuclear stress test 05/24/2015  . Myocardial infarction (HCC) 08/2014  . History of hiatal hernia   . Osteoarthritis     "back, hips, basically all over" (05/24/2015)  . CKD (chronic kidney disease), stage II    Past Surgical History  Procedure Laterality Date  . Hiatal hernia repair  2005    and paraesophageal  . Esophagogastroduodenoscopy  11/27/2009    APC tretment of lesions  . Colonoscopy w/ biopsies and polypectomy  12/17/2006    adenomatous polyps, external hemorrhoids  . Tear duct probing Right     "no plugs or anything"  . Breast lumpectomy with radioactive seed localization Right 01/24/2014    Procedure: BREAST LUMPECTOMY WITH RADIOACTIVE SEED LOCALIZATION;  Surgeon: Avel Peace, MD;  Location: [facility];  Service: General;  Laterality: Right;  . Hernia repair    . Appendectomy    . Total abdominal hysterectomy      w/BSO  . Laparoscopic cholecystectomy    . Breast biopsy Bilateral     "both were negative"  . Breast lumpectomy Right X 2  . Cardiac catheterization N/A 08/18/2014    Procedure: Left Heart Cath and Coronary Angiography;  Surgeon: Marykay Lex, MD;  Location: [facility] INVASIVE CV LAB;  Service: Cardiovascular;  Laterality: N/A;  . Cardiac catheterization  08/18/2014  Procedure: Coronary Balloon Angioplasty;  Surgeon: Marykay Lex, MD;  Location: [facility] INVASIVE CV LAB;  Service: Cardiovascular;;  . Cardiac catheterization N/A 08/21/2014    Procedure: Left Heart Cath and Coronary Angiography;  Surgeon: Marykay Lex, MD;  Location: [facility] INVASIVE CV LAB;  Service: Cardiovascular;  Laterality: N/A;  . Cardiac catheterization  2006  . Cardiac catheterization N/A 05/24/2015    Procedure: Left Heart Cath and Coronary Angiography;  Surgeon: Tonny Bollman, MD;  Location: [facility] INVASIVE CV LAB;  Service: Cardiovascular;  Laterality: N/A;  . Eye surgery     Family History  Problem Relation Age of Onset  . Heart failure Mother   . Diabetes Mother    . Kidney failure Mother   . Hypertension Mother   . Coronary artery disease Mother   . Heart disease Mother   . Hyperlipidemia Mother   . Peripheral vascular disease Mother     amputation  . Breast cancer Sister     x 3 sisters  . Cancer Sister   . Heart disease Father   . Hypertension Father   . Coronary artery disease Father   . Heart attack Father   . Breast cancer Daughter   . Cancer Daughter   . Colon cancer Neg Hx   . Esophageal cancer Neg Hx   . Stomach cancer Neg Hx   . Rectal cancer Neg Hx   . Coronary artery disease Brother   . Heart disease Brother   . Coronary artery disease Brother   . Coronary artery disease Brother   . Breast cancer Sister   . Breast cancer Sister    Social History  Substance Use Topics  . Smoking status: Never Smoker   . Smokeless tobacco: Never Used  . Alcohol Use: Yes   OB History    No data available     Review of Systems A complete 10 system review of systems was obtained and all systems are negative except as noted in the HPI and PMH.   Allergies  Ezetimibe; Ezetimibe-simvastatin; Morphine; Rosuvastatin; Ambien; Plaquenil; Polytrim; and Prevacid  Home Medications   Prior to Admission medications   Medication Sig Start Date End Date Taking? Authorizing Provider  acetaminophen (TYLENOL) 500 MG tablet Take 1,000 mg by mouth every 6 (six) hours as needed (pain).   Yes Historical Provider, MD  aspirin 81 MG tablet Take 81 mg by mouth daily.   Yes Historical Provider, MD  Biotin 10 MG TABS Take 10 mg by mouth daily.   Yes Historical Provider, MD  calcium carbonate (ANTACID) 420 MG CHEW chewable tablet Chew 420 mg by mouth daily.   Yes Historical Provider, MD  clopidogrel (PLAVIX) 75 MG tablet Take 1 tablet (75 mg total) by mouth daily with breakfast. 08/21/15  Yes Tonny Bollman, MD  cyclobenzaprine (FLEXERIL) 10 MG tablet Take 10 mg by mouth 3 (three) times daily as needed for muscle spasms.   Yes Historical Provider, MD  ferrous  sulfate 325 (65 FE) MG tablet Take 325 mg by mouth daily with breakfast.   Yes Historical Provider, MD  isosorbide mononitrate (IMDUR) 30 MG 24 hr tablet Take 1 tablet (30 mg total) by mouth daily. 08/21/15  Yes Tonny Bollman, MD  metoprolol succinate (TOPROL-XL) 25 MG 24 hr tablet Take 0.5 tablets (12.5 mg total) by mouth daily. 08/21/15  Yes Tonny Bollman, MD  Multiple Vitamins-Minerals (EMERGEN-C VITAMIN C PO) Take 1 tablet by mouth daily.   Yes Historical Provider, MD  nitroGLYCERIN (NITROSTAT) 0.4 MG  SL tablet Place 1 tablet (0.4 mg total) under the tongue every 5 (five) minutes as needed for chest pain. 06/12/15  Yes Tonny Bollman, MD  pantoprazole (PROTONIX) 40 MG tablet Take 40 mg by mouth daily. Acid reflux 06/21/15  Yes Historical Provider, MD  Polyvinyl Alcohol-Povidone (REFRESH OP) Place 1 drop into both eyes 2 (two) times daily.    Yes Historical Provider, MD  Pseudoeph-Doxylamine-DM-APAP (NYQUIL MULTI-SYMPTOM PO) Take 1 capsule by mouth at bedtime as needed (for cold).   Yes Historical Provider, MD  temazepam (RESTORIL) 15 MG capsule Take 15 mg by mouth at bedtime as needed for sleep. SLEEP AID 08/03/13  Yes Historical Provider, MD  tiZANidine (ZANAFLEX) 2 MG tablet Take 2 mg by mouth every 6 (six) hours as needed for muscle spasms.   Yes Historical Provider, MD  vancomycin (VANCOCIN) 125 MG capsule Take 125 mg by mouth every 6 (six) hours. 09/21/15  Yes Historical Provider, MD   BP 132/62 mmHg  Pulse 66  Temp(Src) 98.1 F (36.7 C) (Oral)  Resp 12  SpO2 99% Physical Exam  Constitutional: She is oriented to person, place, and time. She appears well-developed and well-nourished. No distress.  HENT:  Head: Normocephalic and atraumatic.  Eyes: EOM are normal.  Neck: Normal range of motion.  Cardiovascular: Normal rate, regular rhythm and normal heart sounds.   Pulmonary/Chest: Effort normal and breath sounds normal.  Abdominal: Soft. She exhibits no distension. There is no tenderness.   Musculoskeletal: Normal range of motion.  Neurological: She is alert and oriented to person, place, and time.  Skin: Skin is warm and dry.  Psychiatric: She has a normal mood and affect. Judgment normal.  Nursing note and vitals reviewed.   ED Course  Procedures  DIAGNOSTIC STUDIES: Oxygen Saturation is 100% on RA, normal by my interpretation.    COORDINATION OF CARE: 3:36 AM Discussed treatment plan with pt at bedside which includes cardiac monitoring and pt agreed to plan.  Labs Review Labs Reviewed  BASIC METABOLIC PANEL - Abnormal; Notable for the following:    Sodium 129 (*)    Chloride 97 (*)    All other components within normal limits  CBC  I-STAT TROPOININ, ED    Imaging Review Dg Chest 2 View  10/03/2015  CLINICAL DATA:  Chest tightness EXAM: CHEST  2 VIEW COMPARISON:  06/13/2015 FINDINGS: Normal heart size and mediastinal contours. Aortic atherosclerosis. There is no edema, consolidation, effusion, or pneumothorax. No acute osseous finding. Cholecystectomy clips. IMPRESSION: No evidence of active disease. Electronically Signed   By: Marnee Spring M.D.   On: 10/03/2015 02:22   I have personally reviewed and evaluated these images and lab results as part of my medical decision-making.   EKG Interpretation   Date/Time:  Wednesday October 03 2015 01:46:54 EDT Ventricular Rate:  79 PR Interval:  144 QRS Duration: 84 QT Interval:  382 QTC Calculation: 438 R Axis:   80 Text Interpretation:  Normal sinus rhythm Normal ECG No significant change  was found Confirmed by Ngan Qualls  MD, Jonessa Triplett ([id number]) on 10/03/2015 3:26:20 AM      MDM   Final diagnoses:  None    Her cardiac catheterization In March 2017 showed total occlusion of the right PDA and PCI was attempted but unsuccessful due to inability to cross the lesion.  Medical therapy was recommended at that time.  Her EKG is without ischemic changes here.  Her pain seems to be resolving.  I think she'll benefit from a  cardiac  rule out.  Patient is agreeable stay.   I personally performed the services described in this documentation, which was scribed in my presence. The recorded information has been reviewed and is accurate.      Azalia Bilis, MD 10/03/15 [phone]

## 2015-10-04 ENCOUNTER — Encounter (HOSPITAL_COMMUNITY): Payer: Self-pay | Admitting: Cardiology

## 2015-10-04 DIAGNOSIS — I10 Essential (primary) hypertension: Secondary | ICD-10-CM | POA: Diagnosis present

## 2015-10-04 DIAGNOSIS — R079 Chest pain, unspecified: Secondary | ICD-10-CM | POA: Diagnosis not present

## 2015-10-04 DIAGNOSIS — A0472 Enterocolitis due to Clostridium difficile, not specified as recurrent: Secondary | ICD-10-CM

## 2015-10-04 DIAGNOSIS — R778 Other specified abnormalities of plasma proteins: Secondary | ICD-10-CM

## 2015-10-04 DIAGNOSIS — R7989 Other specified abnormal findings of blood chemistry: Secondary | ICD-10-CM

## 2015-10-04 HISTORY — DX: Essential (primary) hypertension: I10

## 2015-10-04 HISTORY — DX: Other specified abnormalities of plasma proteins: R77.8

## 2015-10-04 HISTORY — DX: Enterocolitis due to Clostridium difficile, not specified as recurrent: A04.72

## 2015-10-04 HISTORY — DX: Other specified abnormal findings of blood chemistry: R79.89

## 2015-10-04 MED ORDER — AMLODIPINE BESYLATE 2.5 MG PO TABS: 2.5000 mg | ORAL_TABLET | Freq: Every day | ORAL | Status: DC | PRN

## 2015-10-04 NOTE — Progress Notes (Signed)
Subjective: No complaints no chest pain no SOB would like to leave ASAP   Objective: Vital signs in last 24 hours: Temp:  [98 F (36.7 C)-98.1 F (36.7 C)] 98 F (36.7 C) (07/20 0514) Pulse Rate:  [62-68] 62 (07/20 0514) Resp:  [18-20] 20 (07/20 0514) BP: (129-143)/(52-62) 129/52 mmHg (07/20 0514) SpO2:  [97 %-98 %] 98 % (07/20 0514) Weight change:  Last BM Date: 10/03/15 Intake/Output from previous day: 07/19 0701 - 07/20 0700 In: 960 [P.O.:960] Out: -  Intake/Output this shift:    PE: General:Pleasant affect, NAD Skin:Warm and dry, brisk capillary refill HEENT:normocephalic, sclera clear, mucus membranes moist Neck:supple, no JVD Heart:S1S2 RRR without murmur, gallup, rub or click Lungs:clear without rales, rhonchi, or wheezes BMW:UXLK, non tender, + BS, do not palpate liver spleen or masses Ext:tr lower ext edema, 2+ pedal pulses, 2+ radial pulses Neuro:alert and oriented X 3, MAE, follows commands, + facial symmetry Tele:  SR   Lab Results:  Recent Labs  10/03/15 0150  WBC 5.6  HGB 12.0  HCT 36.6  PLT 210   BMET  Recent Labs  10/03/15 0150  NA 129*  K 4.3  CL 97*  CO2 25  GLUCOSE 93  BUN 13  CREATININE 0.87  CALCIUM 10.2    Recent Labs  10/03/15 1309 10/03/15 1850  TROPONINI 0.10* 0.07*    Lab Results  Component Value Date   CHOL 162 08/19/2014   HDL 54 08/19/2014   LDLCALC 88 08/19/2014   TRIG 98 08/19/2014   CHOLHDL 3.0 08/19/2014   Lab Results  Component Value Date   HGBA1C 5.7* 08/18/2014     Lab Results  Component Value Date   TSH 1.629 08/18/2014        Studies/Results: Dg Chest 2 View  10/03/2015  CLINICAL DATA:  Chest tightness EXAM: CHEST  2 VIEW COMPARISON:  06/13/2015 FINDINGS: Normal heart size and mediastinal contours. Aortic atherosclerosis. There is no edema, consolidation, effusion, or pneumothorax. No acute osseous finding. Cholecystectomy clips. IMPRESSION: No evidence of active disease.  Electronically Signed   By: Marnee Spring M.D.   On: 10/03/2015 02:22    Medications: I have reviewed the patient's current medications. Scheduled Meds: . aspirin  81 mg Oral Daily  . calcium carbonate  2 tablet Oral Daily  . clopidogrel  75 mg Oral Q breakfast  . ferrous sulfate  325 mg Oral Q breakfast  . isosorbide mononitrate  30 mg Oral Daily  . metoprolol succinate  12.5 mg Oral Daily  . pantoprazole  40 mg Oral Daily  . vancomycin  125 mg Oral Q6H   Continuous Infusions:  PRN Meds:.acetaminophen, acetaminophen, cyclobenzaprine, nitroGLYCERIN, ondansetron (ZOFRAN) IV, temazepam, tiZANidine  Assessment/Plan: Active Problems:   Chest pain at rest     [name] is a 80 y.o. female with a PMH significant for HTN, dyslipidemia with intolerance of statin therapy, and CAD with known CTO of the PDA who presented with chest discomfort, left arm numbness and fluctuating BP . States that BP was 190/110 at home.   1. Uncontrolled HTN - BP of 190/110 at home. Reason for presentation. BP og 156/122 at presentation. Now improved to 140/59- 1129/52  Echo 05/17/15 with EF of 55-60%. Amlodipine recently d/c at outpatient visit -? Renal artery stenosis, though Cr. Stable.   2. Chest pain  - Atypical. Troponin pk  0.10. Down to .07 Chest pain free currently possible elevation with HTN.   3.  CAD - known CAD with CTO of the PDA. Continue ASA, plavix and BB.   4. Dyslipidemia - The patient is intolerant to statins and Zetia. Continue with lifestyle modification.   Dr. Tenny Craw to see.   Time spent with pt. : 15 minutes. [name]  Nurse Practitioner Certified Pager [phone] or after 5pm and on weekends call [phone] 10/04/2015, 10:25 AM   Patient seen and examined  I agree with findings as noted by [name] above  Pt feels much better  NO CP  On exam:  LUngs CTA   Cardiac RRR  NO S3  Ext without edema  BP has been much better   I would rx amlodipine 2.5 mg to take as needed  for BP greater than 160 systolic. Follow as outpt.    [name]

## 2015-10-04 NOTE — Discharge Instructions (Signed)
Take the amlodipine 2.5 mg  for BP > 160 systolic (top number)  Heart Healthy low salt diet  Call if BP begins to climb again or recurrent elevated BP.

## 2015-10-04 NOTE — Discharge Summary (Signed)
Physician Discharge Summary       Patient ID: [name] MRN: [id number] DOB/AGE: Sep 09, 1935 80 y.o.  Admit date: 10/03/2015 Discharge date: 10/04/2015 Primary Cardiologist:Dr. Excell Seltzer   Discharge Diagnoses:  Principal Problem:   Hypertension, uncontrolled Active Problems:   Chest pain at rest   Dyslipidemia-statin and Zetia intol   CAD S/P unsuccessful PCI 08/18/14   Right bundle branch block   Essential hypertension   Elevated troponin level   C. difficile diarrhea on po vanc   Discharged Condition: good  Procedures: none  Hospital Course: 76 yof with a PMH significant for HTN, dyslipidemia with intolerance of statin therapy, and CAD with known CTO of the PDA who presented with chest discomfort and left arm numbness and uncontrolled HTN -Systolic BP 190 at home.  Here on arrival 156/122 pulse 54.  She was admitted and monitored with mild bump in troponin due to HTN.   Her amlodipine was stopped previously for borderline hypotension.    On day of discharge she feels great and has no complaints.  Anxious to go home. No chest pain.  BP well controlled with no addition of medications.  She has been seen and found stable by Dr. Tenny Craw for discharge.  Her amlodipine will be prn to take 2.5 mg for BP > 160 systolic X 1.  She takes her BP at home.  She will follow up in the office. She is intolerant to statins and zetia.  She will continue ASA, Imdur and BB. Also plavix.   Consults: None  Significant Diagnostic Studies:  BMP Latest Ref Rng 10/03/2015 06/13/2015 05/24/2015  Glucose 65 - 99 mg/dL 93 99 80  BUN 6 - 20 mg/dL 13 15 14   Creatinine 0.44 - 1.00 mg/dL 4.65 6.81 2.75  Sodium 135 - 145 mmol/L 129(L) 136 137  Potassium 3.5 - 5.1 mmol/L 4.3 4.8 4.4  Chloride 101 - 111 mmol/L 97(L) 103 105  CO2 22 - 32 mmol/L 25 23 22   Calcium 8.9 - 10.3 mg/dL 17.0 9.6 9.4   CBC Latest Ref Rng 10/03/2015 06/13/2015 05/24/2015  WBC 4.0 - 10.5 K/uL 5.6 4.5 4.1  Hemoglobin 12.0 - 15.0 g/dL 01.7  11.9(L) 11.0(L)  Hematocrit 36.0 - 46.0 % 36.6 36.8 33.0(L)  Platelets 150 - 400 K/uL 210 194 165   Troponin 0.05;  0.10; 0.07   CHEST 2 VIEW  COMPARISON: 06/13/2015  FINDINGS: Normal heart size and mediastinal contours. Aortic atherosclerosis. There is no edema, consolidation, effusion, or pneumothorax. No acute osseous finding. Cholecystectomy clips.  IMPRESSION: No evidence of active disease.  Discharge Exam: Blood pressure 129/52, pulse 62, temperature 98 F (36.7 C), temperature source Oral, resp. rate 20, SpO2 98 %.   Disposition: 01-Home or Self Care     Medication List    TAKE these medications        acetaminophen 500 MG tablet  Commonly known as:  TYLENOL  Take 1,000 mg by mouth every 6 (six) hours as needed (pain).     amLODipine 2.5 MG tablet  Commonly known as:  NORVASC  Take 1 tablet (2.5 mg total) by mouth daily as needed (take for systolic BP > 160).     ANTACID 420 MG Chew chewable tablet  Generic drug:  calcium carbonate  Chew 420 mg by mouth daily.     aspirin 81 MG tablet  Take 81 mg by mouth daily.     Biotin 10 MG Tabs  Take 10 mg by mouth daily.     clopidogrel  75 MG tablet  Commonly known as:  PLAVIX  Take 1 tablet (75 mg total) by mouth daily with breakfast.     cyclobenzaprine 10 MG tablet  Commonly known as:  FLEXERIL  Take 10 mg by mouth 3 (three) times daily as needed for muscle spasms.     EMERGEN-C VITAMIN C PO  Take 1 tablet by mouth daily.     ferrous sulfate 325 (65 FE) MG tablet  Take 325 mg by mouth daily with breakfast.     isosorbide mononitrate 30 MG 24 hr tablet  Commonly known as:  IMDUR  Take 1 tablet (30 mg total) by mouth daily.     metoprolol succinate 25 MG 24 hr tablet  Commonly known as:  TOPROL-XL  Take 0.5 tablets (12.5 mg total) by mouth daily.     nitroGLYCERIN 0.4 MG SL tablet  Commonly known as:  NITROSTAT  Place 1 tablet (0.4 mg total) under the tongue every 5 (five) minutes as needed  for chest pain.     NYQUIL MULTI-SYMPTOM PO  Take 1 capsule by mouth at bedtime as needed (for cold).     pantoprazole 40 MG tablet  Commonly known as:  PROTONIX  Take 40 mg by mouth daily. Acid reflux     REFRESH OP  Place 1 drop into both eyes 2 (two) times daily.     temazepam 15 MG capsule  Commonly known as:  RESTORIL  Take 15 mg by mouth at bedtime as needed for sleep. SLEEP AID     tiZANidine 2 MG tablet  Commonly known as:  ZANAFLEX  Take 2 mg by mouth every 6 (six) hours as needed for muscle spasms.     vancomycin 125 MG capsule  Commonly known as:  VANCOCIN  Take 125 mg by mouth every 6 (six) hours.       Follow-up Information    Follow up with Tereso Newcomer, PA-C On 10/26/2015.   Specialties:  Cardiology, Physician Assistant   Why:  at 10:45 AM  Dr. Earmon Phoenix PA    Contact information:   [address] [place] [address] [phone]        Discharge Instructions: Take the amlodipine 2.5 mg  for BP > 160 systolic (top number)  Heart Healthy low salt diet  Call if BP begins to climb again or recurrent elevated BP.    Signed: Nada [name] Nurse Practitioner-Certified [facility]: [facility] 10/04/2015, 11:36 AM  Time spent on discharge : > 30 minutes.    Patient seen and examined earler  Agree with plans as noted above by L INgold  I have reviewed with pt  OK for d/c  Plan for  outpt f/u.  [name]

## 2015-10-09 ENCOUNTER — Ambulatory Visit
Admission: RE | Admit: 2015-10-09 | Discharge: 2015-10-09 | Disposition: A | Discharge status: Home / Self Care | Payer: Medicare Other | Source: Ambulatory Visit | Attending: Family Medicine | Admitting: Family Medicine

## 2015-10-09 ENCOUNTER — Ambulatory Visit
Admission: RE | Admit: 2015-10-09 | Discharge: 2015-10-09 | Disposition: A | Discharge status: Home / Self Care | Payer: Medicare Other | Source: Ambulatory Visit

## 2015-10-09 DIAGNOSIS — N644 Mastodynia: Secondary | ICD-10-CM

## 2015-10-21 ENCOUNTER — Emergency Department (HOSPITAL_COMMUNITY)
Admission: EM | Admit: 2015-10-21 | Discharge: 2015-10-22 | Disposition: A | Discharge status: Home / Self Care | Payer: Medicare Other | Attending: Emergency Medicine | Admitting: Emergency Medicine

## 2015-10-21 ENCOUNTER — Encounter (HOSPITAL_COMMUNITY): Payer: Self-pay

## 2015-10-21 DIAGNOSIS — I129 Hypertensive chronic kidney disease with stage 1 through stage 4 chronic kidney disease, or unspecified chronic kidney disease: Secondary | ICD-10-CM | POA: Diagnosis not present

## 2015-10-21 DIAGNOSIS — Z79899 Other long term (current) drug therapy: Secondary | ICD-10-CM | POA: Insufficient documentation

## 2015-10-21 DIAGNOSIS — I252 Old myocardial infarction: Secondary | ICD-10-CM | POA: Insufficient documentation

## 2015-10-21 DIAGNOSIS — I2510 Atherosclerotic heart disease of native coronary artery without angina pectoris: Secondary | ICD-10-CM | POA: Insufficient documentation

## 2015-10-21 DIAGNOSIS — Z7982 Long term (current) use of aspirin: Secondary | ICD-10-CM | POA: Insufficient documentation

## 2015-10-21 DIAGNOSIS — I10 Essential (primary) hypertension: Secondary | ICD-10-CM

## 2015-10-21 DIAGNOSIS — N182 Chronic kidney disease, stage 2 (mild): Secondary | ICD-10-CM | POA: Insufficient documentation

## 2015-10-21 LAB — CBC
HEMATOCRIT: 36.9 % (ref 36.0–46.0)
HEMOGLOBIN: 12.3 g/dL (ref 12.0–15.0)
MCH: 28 pg (ref 26.0–34.0)
MCHC: 33.3 g/dL (ref 30.0–36.0)
MCV: 83.9 fL (ref 78.0–100.0)
Platelets: 202 10*3/uL (ref 150–400)
RBC: 4.4 MIL/uL (ref 3.87–5.11)
RDW: 14.2 % (ref 11.5–15.5)
WBC: 6.6 10*3/uL (ref 4.0–10.5)

## 2015-10-21 LAB — COMPREHENSIVE METABOLIC PANEL
ALBUMIN: 4.1 g/dL (ref 3.5–5.0)
ALK PHOS: 62 U/L (ref 38–126)
ALT: 14 U/L (ref 14–54)
AST: 29 U/L (ref 15–41)
Anion gap: 8 (ref 5–15)
BUN: 11 mg/dL (ref 6–20)
CO2: 23 mmol/L (ref 22–32)
Calcium: 9.8 mg/dL (ref 8.9–10.3)
Chloride: 99 mmol/L — ABNORMAL LOW (ref 101–111)
Creatinine, Ser: 0.98 mg/dL (ref 0.44–1.00)
GFR calc Af Amer: >60 mL/min (ref >60)
GFR calc non Af Amer: 54 mL/min — ABNORMAL LOW (ref >60)
GLUCOSE: 102 mg/dL — AB (ref 65–99)
POTASSIUM: 4.5 mmol/L (ref 3.5–5.1)
SODIUM: 130 mmol/L — AB (ref 135–145)
Total Bilirubin: 0.7 mg/dL (ref 0.3–1.2)
Total Protein: 6.5 g/dL (ref 6.5–8.1)

## 2015-10-21 LAB — I-STAT TROPONIN, ED: Troponin i, poc: 0.01 ng/mL (ref 0.00–0.08)

## 2015-10-21 MED ORDER — DEXAMETHASONE SODIUM PHOSPHATE 10 MG/ML IJ SOLN: 10.0000 mg | Freq: Once | INTRAMUSCULAR | Status: DC

## 2015-10-21 NOTE — ED Provider Notes (Signed)
[facility]-EMERGENCY DEPT Provider Note   CSN: [id number] Arrival date & time: 10/21/15  2151  By signing my name below, I, Phillis Haggis, attest that this documentation has been prepared under the direction and in the presence of Gilda Crease, MD. Electronically Signed: Phillis Haggis, ED Scribe. 10/21/15. 11:14 PM.  First Provider Contact:  None    History   Chief Complaint Chief Complaint  Patient presents with  . Hypertension    The history is provided by the patient. No language interpreter was used.  HPI Comments: [name] is a 80 y.o. Female with a hx of CAD, CKD, HTN, MI, Sicca disease, Sjogren's disease, and SVT who presents to the Emergency Department complaining of elevated BP onset 3 hours ago. Pt reports associated SOB and fatigue that has since resolved. Pt states that her doctor told her to take metoprolol if her BP reached over 160. Her BP was 172/112 PTA. She took Metoprolol at 9:30 PM to relief. She reports that she currently feels much better. She also states that she has been having consistent headaches over the past month. Pt was last seen on 10/03/15 in the ED and admitted. She denies fever, chills, chest pain, or acute leg swelling.   Past Medical History:  Diagnosis Date  . Abnormal nuclear stress test 05/24/2015  . C. difficile diarrhea on po vanc 10/04/2015  . CAD (coronary artery disease) 2006, 2016   a. 08/2014 NSTEMI: Attempted PCI of 85% RPDA - unable to wire, complicated by dissection, case aborted;  b. 08/2014 Relook Cath: LM nl, LAD 60p, SP1 90ost (small), D1 65ost, RI 45/50, RCA nl, RPDA 90 - healed dissection;  b. 08/2014 Echo: EF 55%, mild/mon midapical inferior HK, mild AI, PASP . c. Cath 05/2015 with CTO of the right-PDA and unsuccessful PCI  . chronic back   . CKD (chronic kidney disease), stage II   . COLONIC POLYPS, ADENOMATOUS, HX OF   . Diverticulitis   . Dyslipidemia    a. statin intolerant.  . Elevated troponin level 10/04/2015  .  ENDOMETRIOSIS   . Essential hypertension   . Fibromyalgia   . GASTRIC ANTRAL VASCULAR ECTASIA   . GERD   . History of hiatal hernia   . Hypertension, uncontrolled 10/04/2015  . Insomnia   . IRON DEFICIENCY ANEMIA SECONDARY TO BLOOD LOSS   . Myocardial infarction (HCC) 08/2014  . Neuropathy (HCC)   . Osteoarthritis    "back, hips, basically all over" (05/24/2015)  . Raynaud's syndrome   . Rosacea, acne   . Sicca syndrome (HCC)   . SJOGREN'S SYNDROME   . SUPRAVENTRICULAR TACHYCARDIA   . Vitamin D deficiency     Patient Active Problem List   Diagnosis Date Noted  . Hypertension, uncontrolled 10/04/2015  . Elevated troponin level 10/04/2015  . C. difficile diarrhea on po vanc 10/04/2015  . Chest pain at rest 10/03/2015  . Chest pain with high risk for cardiac etiology 06/13/2015  . Unstable angina (HCC) 06/13/2015  . Abnormal nuclear stress test 05/24/2015  . Essential hypertension   . CAD (coronary artery disease)   . NSTEMI (non-ST elevated myocardial infarction) (HCC) 08/18/2014  . Paresthesia 08/01/2014  . Atypical ductal hyperplasia of right breast 02/22/2014  . Dizziness and giddiness 05/24/2013  . Palpitations 03/06/2011  . SJOGREN'S SYNDROME 04/16/2009  . SUPRAVENTRICULAR TACHYCARDIA 06/24/2008  . Dyslipidemia-statin and Zetia intol 04/20/2007  . Anemia 04/20/2007  . CAD S/P unsuccessful PCI 08/18/14 04/20/2007  . Right bundle branch block 04/20/2007  .  Raynaud's syndrome 04/20/2007  . GERD 04/20/2007  . GASTRIC ANTRAL VASCULAR ECTASIA 04/20/2007  . ENDOMETRIOSIS 04/20/2007  . ACNE ROSACEA 04/20/2007  . OSTEOARTHRITIS 04/20/2007  . COLONIC POLYPS, ADENOMATOUS, HX OF 04/20/2007  . HEADACHE, CHRONIC, HX OF 04/20/2007    Past Surgical History:  Procedure Laterality Date  . APPENDECTOMY    . BREAST BIOPSY Bilateral    "both were negative"  . BREAST LUMPECTOMY Right X 2  . BREAST LUMPECTOMY WITH RADIOACTIVE SEED LOCALIZATION Right 01/24/2014   Procedure: BREAST  LUMPECTOMY WITH RADIOACTIVE SEED LOCALIZATION;  Surgeon: Avel Peace, MD;  Location: [facility];  Service: General;  Laterality: Right;  . CARDIAC CATHETERIZATION N/A 08/18/2014   Procedure: Left Heart Cath and Coronary Angiography;  Surgeon: Marykay Lex, MD;  Location[facility] INVASIVE CV LAB;  Service: Cardiovascular;  Laterality: N/A;  . CARDIAC CATHETERIZATION  08/18/2014   Procedure: Coronary Balloon Angioplasty;  Surgeon: Marykay Lex, MD;  Location: [facility] INVASIVE CV LAB;  Service: Cardiovascular;;  . CARDIAC CATHETERIZATION N/A 08/21/2014   Procedure: Left Heart Cath and Coronary Angiography;  Surgeon: Marykay Lex, MD;  Location: [facility] INVASIVE CV LAB;  Service: Cardiovascular;  Laterality: N/A;  . CARDIAC CATHETERIZATION  2006  . CARDIAC CATHETERIZATION N/A 05/24/2015   Procedure: Left Heart Cath and Coronary Angiography;  Surgeon: Tonny Bollman, MD;  Location: [facility] INVASIVE CV LAB;  Service: Cardiovascular;  Laterality: N/A;  . COLONOSCOPY W/ BIOPSIES AND POLYPECTOMY  12/17/2006   adenomatous polyps, external hemorrhoids  . ESOPHAGOGASTRODUODENOSCOPY  11/27/2009   APC tretment of lesions  . EYE SURGERY    . HERNIA REPAIR    . HIATAL HERNIA REPAIR  2005   and paraesophageal  . LAPAROSCOPIC CHOLECYSTECTOMY    . TEAR DUCT PROBING Right    "no plugs or anything"  . TOTAL ABDOMINAL HYSTERECTOMY     w/BSO    OB History    No data available       Home Medications    Prior to Admission medications   Medication Sig Start Date End Date Taking? Authorizing Provider  acetaminophen (TYLENOL) 500 MG tablet Take 1,000 mg by mouth every 6 (six) hours as needed (pain).   Yes Historical Provider, MD  amLODipine (NORVASC) 2.5 MG tablet Take 1 tablet (2.5 mg total) by mouth daily as needed (take for systolic BP > 160). 10/04/15  Yes Leone Brand, NP  aspirin 81 MG tablet Take 81 mg by mouth daily.   Yes Historical Provider, MD  Biotin 10 MG TABS Take 10 mg by mouth daily.   Yes  Historical Provider, MD  calcium carbonate (ANTACID) 420 MG CHEW chewable tablet Chew 420 mg by mouth daily.   Yes Historical Provider, MD  clopidogrel (PLAVIX) 75 MG tablet Take 1 tablet (75 mg total) by mouth daily with breakfast. 08/21/15  Yes Tonny Bollman, MD  cyclobenzaprine (FLEXERIL) 10 MG tablet Take 10 mg by mouth 3 (three) times daily as needed for muscle spasms.   Yes Historical Provider, MD  ferrous sulfate 325 (65 FE) MG tablet Take 325 mg by mouth daily with breakfast.   Yes Historical Provider, MD  isosorbide mononitrate (IMDUR) 30 MG 24 hr tablet Take 1 tablet (30 mg total) by mouth daily. 08/21/15  Yes Tonny Bollman, MD  metoprolol succinate (TOPROL-XL) 25 MG 24 hr tablet Take 0.5 tablets (12.5 mg total) by mouth daily. 08/21/15  Yes Tonny Bollman, MD  Multiple Vitamins-Minerals (EMERGEN-C VITAMIN C PO) Take 1 tablet by mouth daily.  Yes Historical Provider, MD  nitroGLYCERIN (NITROSTAT) 0.4 MG SL tablet Place 1 tablet (0.4 mg total) under the tongue every 5 (five) minutes as needed for chest pain. 06/12/15  Yes Tonny Bollman, MD  pantoprazole (PROTONIX) 40 MG tablet Take 40 mg by mouth daily. Acid reflux 06/21/15  Yes Historical Provider, MD  Polyvinyl Alcohol-Povidone (REFRESH OP) Place 1 drop into both eyes 2 (two) times daily.    Yes Historical Provider, MD  Pseudoeph-Doxylamine-DM-APAP (NYQUIL MULTI-SYMPTOM PO) Take 1 capsule by mouth at bedtime as needed (for cold).   Yes Historical Provider, MD  temazepam (RESTORIL) 15 MG capsule Take 15 mg by mouth at bedtime as needed for sleep. SLEEP AID 08/03/13  Yes Historical Provider, MD  tiZANidine (ZANAFLEX) 2 MG tablet Take 2 mg by mouth every 6 (six) hours as needed for muscle spasms.   Yes Historical Provider, MD    Family History Family History  Problem Relation Age of Onset  . Heart failure Mother   . Diabetes Mother   . Kidney failure Mother   . Hypertension Mother   . Coronary artery disease Mother   . Heart disease Mother    . Hyperlipidemia Mother   . Peripheral vascular disease Mother     amputation  . Breast cancer Sister     x 3 sisters  . Cancer Sister   . Heart disease Father   . Hypertension Father   . Coronary artery disease Father   . Heart attack Father   . Coronary artery disease Brother   . Heart disease Brother   . Coronary artery disease Brother   . Coronary artery disease Brother   . Breast cancer Sister   . Breast cancer Sister   . Breast cancer Daughter   . Cancer Daughter   . Colon cancer Neg Hx   . Esophageal cancer Neg Hx   . Stomach cancer Neg Hx   . Rectal cancer Neg Hx     Social History Social History  Substance Use Topics  . Smoking status: Never Smoker  . Smokeless tobacco: Never Used  . Alcohol use Yes     Allergies   Ezetimibe; Ezetimibe-simvastatin; Morphine; Rosuvastatin; Ambien [zolpidem tartrate]; Plaquenil [hydroxychloroquine sulfate]; Polytrim [polymyxin b-trimethoprim]; and Prevacid [lansoprazole]   Review of Systems Review of Systems  Constitutional: Positive for fatigue.  Respiratory: Positive for shortness of breath.   Cardiovascular: Negative for chest pain and leg swelling.  Neurological: Positive for headaches.  All other systems reviewed and are negative.    Physical Exam Updated Vital Signs BP 135/63 (BP Location: Right Arm)   Pulse 80   Temp 98.6 F (37 C)   Resp 16   Ht 5\' 2"  (1.575 m)   Wt 135 lb (61.2 kg)   SpO2 100%   BMI 24.69 kg/m   Physical Exam  Constitutional: She is oriented to person, place, and time. She appears well-developed and well-nourished. No distress.  HENT:  Head: Normocephalic and atraumatic.  Right Ear: Hearing normal.  Left Ear: Hearing normal.  Nose: Nose normal.  Mouth/Throat: Oropharynx is clear and moist and mucous membranes are normal.  Eyes: Conjunctivae and EOM are normal. Pupils are equal, round, and reactive to light.  Neck: Normal range of motion. Neck supple.  Cardiovascular: Regular  rhythm, S1 normal and S2 normal.  Exam reveals no gallop and no friction rub.   No murmur heard. Pulmonary/Chest: Effort normal and breath sounds normal. No respiratory distress. She exhibits no tenderness.  Abdominal: Soft. Normal appearance  and bowel sounds are normal. There is no hepatosplenomegaly. There is no tenderness. There is no rebound, no guarding, no tenderness at McBurney's point and negative Murphy's sign. No hernia.  Musculoskeletal: Normal range of motion. She exhibits edema.  Trace to 1+ edema of lower extremities  Neurological: She is alert and oriented to person, place, and time. She has normal strength. No cranial nerve deficit or sensory deficit. Coordination normal. GCS eye subscore is 4. GCS verbal subscore is 5. GCS motor subscore is 6.  Skin: Skin is warm, dry and intact. No rash noted. No cyanosis.  Psychiatric: She has a normal mood and affect. Her speech is normal and behavior is normal. Thought content normal.  Nursing note and vitals reviewed.    ED Treatments / Results  DIAGNOSTIC STUDIES: Oxygen Saturation is 100% on RA, normal by my interpretation.    COORDINATION OF CARE: 11:07 PM-Discussed treatment plan which includes labs, EKG, and consult with cardiologist with pt at bedside and pt agreed to plan.    Labs (all labs ordered are listed, but only abnormal results are displayed) Labs Reviewed  COMPREHENSIVE METABOLIC PANEL - Abnormal; Notable for the following:       Result Value   Sodium 130 (*)    Chloride 99 (*)    Glucose, Bld 102 (*)    GFR calc non Af Amer 54 (*)    All other components within normal limits  CBC  I-STAT TROPOININ, ED  I-STAT TROPOININ, ED    EKG  EKG Interpretation  Date/Time:  Sunday October 21 2015 22:03:57 EDT Ventricular Rate:  89 PR Interval:  132 QRS Duration: 80 QT Interval:  356 QTC Calculation: 433 R Axis:   80 Text Interpretation:  Normal sinus rhythm Normal ECG Confirmed by Tuwana Kapaun  MD, Nadia Torr  ([id number]) on 10/21/2015 11:02:35 PM       Radiology No results found.  Procedures Procedures (including critical care time)  Medications Ordered in ED Medications - No data to display   Initial Impression / Assessment and Plan / ED Course  I have reviewed the triage vital signs and the nursing notes.  Pertinent labs & imaging results that were available during my care of the patient were reviewed by me and considered in my medical decision making (see chart for details).  Clinical Course    Patient with known history of coronary artery disease presents to the emergency department for evaluation of elevated blood pressure. Patient was hospitalized one month ago for uncontrolled hypertension. Patient has history of 100% occlusion of posterior descending artery that was not amenable to PCI in March this year. She is medical management. She checked her blood pressure tonight because she was experiencing headache and feeling short of breath. Her cardiologist has her on Toprol daily and she has a prescription for amlodipine 2.5 mg to be used if her blood pressure was over 160 systolic. She did take amlodipine tonight but her pressure did not come down so she presented to the ER. Here in the ER blood pressure has progressively improved and her symptoms have completely resolved.  Presentation EKG is unremarkable. Troponin was normal as well. Discussed with Dr. Arlyn Leak, on call for cardiology. He did feel that the patient was appropriate for delta troponin examination and discharge if normal. Her second troponin to come back within normal limits. Patient will be discharged, no changes in her medication at this time. She is to contact cardiology office in the morning and be seen this week for recheck.  Final Clinical Impressions(s) / ED Diagnoses   Final diagnoses:  None  Essential hypertension  I personally performed the services described in this documentation, which was scribed in my presence.  The recorded information has been reviewed and is accurate.     New Prescriptions New Prescriptions   No medications on file     Gilda Crease, MD 10/22/15 [phone]

## 2015-10-21 NOTE — ED Triage Notes (Signed)
Pt denies other complaints however pt appears weak and SOB to RN at triage; Pt has hx of MI; pt just stopped antibiotic for C.Diff.

## 2015-10-21 NOTE — ED Notes (Signed)
Patient becomes SOB with talking

## 2015-10-21 NOTE — ED Triage Notes (Signed)
Pt states she took BP at home and BP was elevated; Pt states last BP was 172/112; Pt denies pain on arrival. Pt states she is on BP meds and took Metoprolol at 2130 tonight; Pt a&ox 4 on arrival.

## 2015-10-22 LAB — I-STAT TROPONIN, ED: Troponin i, poc: 0.02 ng/mL (ref 0.00–0.08)

## 2015-10-22 NOTE — ED Notes (Signed)
Patient Alert and oriented X4. Stable and ambulatory. Patient verbalized understanding of the discharge instructions.  Patient belongings were taken by the patient. Advised by MD and reinforced by this RN to take blood pressure in the am and pm to have numbers to give cardiology

## 2015-10-26 ENCOUNTER — Ambulatory Visit: Payer: Medicare Other | Admitting: Physician Assistant

## 2015-11-30 ENCOUNTER — Other Ambulatory Visit: Payer: Self-pay | Admitting: *Deleted

## 2015-11-30 MED ORDER — ISOSORBIDE MONONITRATE ER 30 MG PO TB24: 30.0000 mg | ORAL_TABLET | Freq: Every day | ORAL | 3 refills | Status: DC

## 2015-12-05 ENCOUNTER — Other Ambulatory Visit: Payer: Self-pay | Admitting: Family Medicine

## 2015-12-05 DIAGNOSIS — N63 Unspecified lump in unspecified breast: Secondary | ICD-10-CM

## 2015-12-10 ENCOUNTER — Ambulatory Visit
Admission: RE | Admit: 2015-12-10 | Discharge: 2015-12-10 | Disposition: A | Discharge status: Home / Self Care | Payer: Medicare Other | Source: Ambulatory Visit | Attending: Family Medicine | Admitting: Family Medicine

## 2015-12-10 DIAGNOSIS — N63 Unspecified lump in unspecified breast: Secondary | ICD-10-CM

## 2015-12-28 ENCOUNTER — Telehealth: Payer: Self-pay | Admitting: Internal Medicine

## 2015-12-28 NOTE — Telephone Encounter (Signed)
A user error has taken place.

## 2016-01-07 ENCOUNTER — Ambulatory Visit (INDEPENDENT_AMBULATORY_CARE_PROVIDER_SITE_OTHER): Payer: Medicare Other | Admitting: Cardiovascular Disease

## 2016-01-07 ENCOUNTER — Telehealth: Payer: Self-pay | Admitting: Cardiovascular Disease

## 2016-01-07 VITALS — BP 110/62 | HR 76 | Ht 63.0 in | Wt 135.8 lb

## 2016-01-07 DIAGNOSIS — I10 Essential (primary) hypertension: Secondary | ICD-10-CM

## 2016-01-07 DIAGNOSIS — I25119 Atherosclerotic heart disease of native coronary artery with unspecified angina pectoris: Secondary | ICD-10-CM

## 2016-01-07 NOTE — Patient Instructions (Signed)
Medication Instructions:  Your physician has recommended you make the following change in your medication:  1. STOP Amlodipine  Labwork: No new orders.   Testing/Procedures: No new orders.   Follow-Up: Your physician wants you to follow-up in: 6 MONTHS with Dr Cooper.  You will receive a reminder letter in the mail two months in advance. If you don't receive a letter, please call our office to schedule the follow-up appointment.   Any Other Special Instructions Will Be Listed Below (If Applicable).     If you need a refill on your cardiac medications before your next appointment, please call your pharmacy.   

## 2016-01-07 NOTE — Telephone Encounter (Signed)
error 

## 2016-01-07 NOTE — Progress Notes (Signed)
Cardiology Office Note Date:  01/07/2016   ID:  [name] LimaBetty S Eckrich, DOB 06/18/1935, MRN [id number]  PCP:  Cala BradfordWHITE,CYNTHIA S, MD  Cardiologist:  Tonny Bollmanooper, Sheril Hammond, MD    Chief Complaint  Patient presents with  . Coronary Artery Disease     History of Present Illness: [name] is a 80 y.o. female who presents for follow-up of CAD. The patient presented with non-ST elevation infarction in 2016. She was noted to have severe stenosis in a PDA branch and PCI was attempted. This was complicated by localized dissection. Medical therapy was pursued. However, in February of this year she developed worsening exercise tolerance and dyspnea with low-level activity. A stress test showed ischemia in the PDA territory but also in the LAD territory. The stress test was high-risk and cardiac catheterization was done. Stem is rated total occlusion of the PDA. The LAD that was felt to have nonobstructive disease. PCI was attempted but failed because of balloon could not be passed across the total occlusion. The patient has been managed medically since that time. Films were reviewed with interventional colleagues who all agree that medical therapy was the best treatment option for this patient.  The patient has had issues with back pain and GI problems after a C Dif infection. From a cardiac perspective she is doing ok. Denies chest pain or pressure. No edema, orthopnea, palpitations, or PND. Complains of marked fatigue - longstanding.  Past Medical History:  Diagnosis Date  . Abnormal nuclear stress test 05/24/2015  . C. difficile diarrhea on po vanc 10/04/2015  . CAD (coronary artery disease) 2006, 2016   a. 08/2014 NSTEMI: Attempted PCI of 85% RPDA - unable to wire, complicated by dissection, case aborted;  b. 08/2014 Relook Cath: LM nl, LAD 60p, SP1 90ost (small), D1 65ost, RI 45/50, RCA nl, RPDA 90 - healed dissection;  b. 08/2014 Echo: EF 55%, mild/mon midapical inferior HK, mild AI, PASP 43mmHg. c. Cath 05/2015  with CTO of the right-PDA and unsuccessful PCI  . chronic back   . CKD (chronic kidney disease), stage II   . COLONIC POLYPS, ADENOMATOUS, HX OF   . Diverticulitis   . Dyslipidemia    a. statin intolerant.  . Elevated troponin level 10/04/2015  . ENDOMETRIOSIS   . Essential hypertension   . Fibromyalgia   . GASTRIC ANTRAL VASCULAR ECTASIA   . GERD   . History of hiatal hernia   . Hypertension, uncontrolled 10/04/2015  . Insomnia   . IRON DEFICIENCY ANEMIA SECONDARY TO BLOOD LOSS   . Myocardial infarction 08/2014  . Neuropathy (HCC)   . Osteoarthritis    "back, hips, basically all over" (05/24/2015)  . Raynaud's syndrome   . Rosacea, acne   . Sicca syndrome (HCC)   . SJOGREN'S SYNDROME   . SUPRAVENTRICULAR TACHYCARDIA   . Vitamin D deficiency     Past Surgical History:  Procedure Laterality Date  . APPENDECTOMY    . BREAST BIOPSY Bilateral    "both were negative"  . BREAST LUMPECTOMY Right X 2  . BREAST LUMPECTOMY WITH RADIOACTIVE SEED LOCALIZATION Right 01/24/2014   Procedure: BREAST LUMPECTOMY WITH RADIOACTIVE SEED LOCALIZATION;  Surgeon: Avel Peaceodd Rosenbower, MD;  Location: [facility];  Service: General;  Laterality: Right;  . CARDIAC CATHETERIZATION N/A 08/18/2014   Procedure: Left Heart Cath and Coronary Angiography;  Surgeon: Marykay Lexavid W Harding, MD;  [facility] INVAS[facility] CV LAB;  Service: Cardiovascular;  Laterality: N/A;  . CARDIAC CATHETERIZATION  08/18/2014   Procedure:  Coronary Balloon Angioplasty;  Surgeon: Marykay Lex, MD;  Location: [facility] INVASIVE CV LAB;  Service: Cardiovascular;;  . CARDIAC CATHETERIZATION N/A 08/21/2014   Procedure: Left Heart Cath and Coronary Angiography;  Surgeon: Marykay Lex, MD;  Location: [facility] INVASIVE CV LAB;  Service: Cardiovascular;  Laterality: N/A;  . CARDIAC CATHETERIZATION  2006  . CARDIAC CATHETERIZATION N/A 05/24/2015   Procedure: Left Heart Cath and Coronary Angiography;  Surgeon: Tonny Bollman, MD;  Location: [facility] INVASIVE CV  LAB;  Service: Cardiovascular;  Laterality: N/A;  . COLONOSCOPY W/ BIOPSIES AND POLYPECTOMY  12/17/2006   adenomatous polyps, external hemorrhoids  . ESOPHAGOGASTRODUODENOSCOPY  11/27/2009   APC tretment of lesions  . EYE SURGERY    . HERNIA REPAIR    . HIATAL HERNIA REPAIR  2005   and paraesophageal  . LAPAROSCOPIC CHOLECYSTECTOMY    . TEAR DUCT PROBING Right    "no plugs or anything"  . TOTAL ABDOMINAL HYSTERECTOMY     w/BSO    Current Outpatient Prescriptions  Medication Sig Dispense Refill  . acetaminophen (TYLENOL) 500 MG tablet Take 1,000 mg by mouth every 6 (six) hours as needed (pain).    [name] amLODipine (NORVASC) 2.5 MG tablet Take 1.25 mg by mouth daily.    [name] aspirin 81 MG tablet Take 81 mg by mouth daily.    . Biotin 10 MG TABS Take 10 mg by mouth daily.    . calcium carbonate (ANTACID) 420 MG CHEW chewable tablet Chew 420 mg by mouth daily.    . clopidogrel (PLAVIX) 75 MG tablet Take 1 tablet (75 mg total) by mouth daily with breakfast. 90 tablet 3  . cyclobenzaprine (FLEXERIL) 10 MG tablet Take 10 mg by mouth 3 (three) times daily as needed for muscle spasms.    . ferrous sulfate 325 (65 FE) MG tablet Take 325 mg by mouth daily with breakfast.    . isosorbide mononitrate (IMDUR) 30 MG 24 hr tablet Take 1 tablet (30 mg total) by mouth daily. 90 tablet 3  . metoprolol succinate (TOPROL-XL) 25 MG 24 hr tablet Take 0.5 tablets (12.5 mg total) by mouth daily. 90 tablet 3  . Multiple Vitamins-Minerals (EMERGEN-C VITAMIN C PO) Take 1 tablet by mouth daily.    . nitroGLYCERIN (NITROSTAT) 0.4 MG SL tablet Place 1 tablet (0.4 mg total) under the tongue every 5 (five) minutes as needed for chest pain. 25 tablet 1  . pantoprazole (PROTONIX) 40 MG tablet Take 40 mg by mouth daily. Acid reflux    . Polyvinyl Alcohol-Povidone (REFRESH OP) Place 1 drop into both eyes 2 (two) times daily.     . Pseudoeph-Doxylamine-DM-APAP (NYQUIL MULTI-SYMPTOM PO) Take 1 capsule by mouth at bedtime as needed  (for cold).    . temazepam (RESTORIL) 15 MG capsule Take 15 mg by mouth at bedtime as needed for sleep. SLEEP AID    . tiZANidine (ZANAFLEX) 2 MG tablet Take 2 mg by mouth every 6 (six) hours as needed for muscle spasms.    [name] zolpidem (AMBIEN) 5 MG tablet Take 5 mg by mouth at bedtime as needed for sleep.     No current facility-administered medications for this visit.     Allergies:   Ezetimibe; Ezetimibe-simvastatin; Morphine; Rosuvastatin; Ambien [zolpidem tartrate]; Plaquenil [hydroxychloroquine sulfate]; Polytrim [polymyxin b-trimethoprim]; and Prevacid [lansoprazole]   Social History:  The patient  reports that she has never smoked. She has never used smokeless tobacco. She reports that she drinks alcohol. She reports that she does not use drugs.  Family History:  The patient's  family history includes Breast cancer in her daughter, sister, sister, and sister; Cancer in her daughter and sister; Coronary artery disease in her brother, brother, brother, father, and mother; Diabetes in her mother; Heart attack in her father; Heart disease in her brother, father, and mother; Heart failure in her mother; Hyperlipidemia in her mother; Hypertension in her father and mother; Kidney failure in her mother; Peripheral vascular disease in her mother.    ROS:  Please see the history of present illness.  Otherwise, review of systems is positive for hearing loss, abdominal pain, diarrhea, easy bruising, fatigue, chest pressure, joint swelling, headaches.  All other systems are reviewed and negative.    PHYSICAL EXAM: VS:  BP 110/62   Pulse 76   Ht 5\' 3"  (1.6 m)   Wt 61.6 kg (135 lb 12.8 oz)   BMI 24.06 kg/m  , BMI Body mass index is 24.06 kg/m. GEN: Well nourished, well developed, in no acute distress  HEENT: normal  Neck: no JVD, no masses. No carotid bruits Cardiac: RRR without murmur or gallop                Respiratory:  clear to auscultation bilaterally, normal work of breathing GI: soft,  nontender, nondistended, + BS MS: no deformity or atrophy  Ext: no pretibial edema, pedal pulses 2+= bilaterally Skin: warm and dry, no rash Neuro:  Strength and sensation are intact Psych: euthymic mood, full affect  EKG:  EKG is not ordered today.   Recent Labs: 10/21/2015: ALT 14; BUN 11; Creatinine, Ser 0.98; Hemoglobin 12.3; Platelets 202; Potassium 4.5; Sodium 130   Lipid Panel     Component Value Date/Time   CHOL 162 08/19/2014 0255   TRIG 98 08/19/2014 0255   HDL 54 08/19/2014 0255   CHOLHDL 3.0 08/19/2014 0255   VLDL 20 08/19/2014 0255   LDLCALC 88 08/19/2014 0255      Wt Readings from Last 3 Encounters:  01/07/16 61.6 kg (135 lb 12.8 oz)  10/21/15 61.2 kg (135 lb)  08/21/15 59.5 kg (131 lb 3.2 oz)     Cardiac Studies Reviewed: 2D Echo 05-17-2015: Study Conclusions  - Left ventricle: The cavity size was normal. Wall thickness was   normal. Systolic function was normal. The estimated ejection   fraction was in the range of 55% to 60%. Wall motion was normal;   there were no regional wall motion abnormalities. Doppler   parameters are consistent with abnormal left ventricular   relaxation (grade 1 diastolic dysfunction). - Aortic valve: There was mild stenosis. There was mild   regurgitation. Mean gradient (S): 8 mm Hg. Peak gradient (S): 16   mm Hg. - Mitral valve: Calcified annulus. Mildly thickened leaflets . - Pulmonary arteries: PA peak pressure: 34 mm Hg (S).  Cardiac Cath 05-24-2015: Conclusion    Prox RCA lesion, 25% stenosed.  Dist RCA lesion, 25% stenosed.  Prox Cx to Mid Cx lesion, 50% stenosed.  Prox LAD lesion, 50% stenosed.  Dist LAD lesion, 25% stenosed.  RPDA lesion, 100% stenosed. Post intervention, there is a 100% residual stenosis.   1. Severe single-vessel coronary artery disease with total occlusion of the right PDA, attempted PCI unsuccessful due to inability to cross lesion  2. Heavily calcified coronary arteries with mild to  moderate diffuse CAD involving the LAD and left circumflex, appropriate for medical therapy  3. Normal LV function by noninvasive assessment    ASSESSMENT AND PLAN: 1.  CAD, native  vessel, without angina: pt stable on current medical therapy. Medications reviewed - see below for changes.   2. HTN: BP running low and patient with fatigue. Stop amlodipine. Continue remaining meds without change.   3. Dyslipidemia: unable to tolerate statin drugs or ezetimibe. Continue lifestyle modification.  Current medicines are reviewed with the patient today.  The patient does not have concerns regarding medicines.  Labs/ tests ordered today include:  No orders of the defined types were placed in this encounter.   Disposition:   FU 6 months  Signed, Tonny Bollman, MD  01/07/2016 9:39 AM    [facility] [address] [place], [place], [place]  [address] Phone: [phone]; Fax: [phone]

## 2016-01-08 ENCOUNTER — Ambulatory Visit (INDEPENDENT_AMBULATORY_CARE_PROVIDER_SITE_OTHER): Payer: Medicare Other | Admitting: Internal Medicine

## 2016-01-08 ENCOUNTER — Encounter: Payer: Self-pay | Admitting: Internal Medicine

## 2016-01-08 VITALS — BP 126/72 | HR 84 | Ht 63.0 in | Wt 133.0 lb

## 2016-01-08 DIAGNOSIS — M545 Low back pain, unspecified: Secondary | ICD-10-CM

## 2016-01-08 DIAGNOSIS — K580 Irritable bowel syndrome with diarrhea: Secondary | ICD-10-CM

## 2016-01-08 NOTE — Patient Instructions (Signed)
   Please take your probiotic twice daily for a month.   Continue your XS Tylenol and Flexeril for your back and follow up with Dr Cliffton Asters about those issues.   We will await the stool study results.    In one - two months if still having problems call us back.    I appreciate the opportunity to care for you. Stan Head, MD, [facility]

## 2016-01-08 NOTE — Progress Notes (Signed)
[name] 80 y.o. 02-Jun-1935 [id number]  Assessment & Plan:   Encounter Diagnoses  Name Primary?  . Irritable bowel syndrome with diarrhea - post infectious (C diff) Yes  . Acute midline low back pain without sciatica     Bid probiotic x 1 month at least for post-infectious IBS Very much doubt she still has C diff given some formed stools - should all be watery if C diff Low fiber diet suggested Flexeril and Tylenol + f/u Dr. Cliffton Asters re: back pain (this is her biggest sx source presently)  Await stool study results from Dr. Cliffton Asters Subjective:   Chief Complaint: C diff - recurrent, diarrhea, anorexia  HPI The patient has been dx w/ C diff colitis at least twice in past several mos  (06/2015 and 09/2015)- ecentuall treated with vancomycin and watery diarrhea resolved. Never back to normal bowel habits.She is still having intermittent loose/mushy stools but sometimes formed. + Anorexia and fatigue. Some mucous dc with stool. Not nocturnal, no incontinence Was cleaning tubs a few days ago and seems to have aggravated chronic low back pain - has sig persistent midline low back pain. Has not taken Flexeril that she has. No fevers but has felt hot.Review of 01/04/2016 office notes - has had reorder of C diff toxin as well as Cx and Ova and Parasite exam. CBC and CMET also. Results not here.  She purchased probiotic yesterday ? Florastor Is eating reg diet Colonoscopy 2015 diverticulosis  Allergies  Allergen Reactions  . Ezetimibe Other (See Comments)    Muscle pain= zetia  . Ezetimibe-Simvastatin Other (See Comments)    Muscle pain= vytorin  . Morphine Other (See Comments)    nightmares  . Rosuvastatin Other (See Comments)    Muscle pain  . Ambien [Zolpidem Tartrate] Other (See Comments)    Fell down  . Plaquenil [Hydroxychloroquine Sulfate] Other (See Comments)    Doesn't recall reaction  . Polytrim [Polymyxin B-Trimethoprim] Other (See Comments)    unknown  . Prevacid  [Lansoprazole] Other (See Comments)    dizziness   Outpatient Medications Prior to Visit  Medication Sig Dispense Refill  . acetaminophen (TYLENOL) 500 MG tablet Take 1,000 mg by mouth every 6 (six) hours as needed (pain).    [name] aspirin 81 MG tablet Take 81 mg by mouth daily.    . Biotin 10 MG TABS Take 10 mg by mouth daily.    . calcium carbonate (ANTACID) 420 MG CHEW chewable tablet Chew 420 mg by mouth daily.    . clopidogrel (PLAVIX) 75 MG tablet Take 1 tablet (75 mg total) by mouth daily with breakfast. 90 tablet 3  . cyclobenzaprine (FLEXERIL) 10 MG tablet Take 10 mg by mouth 3 (three) times daily as needed for muscle spasms.    . ferrous sulfate 325 (65 FE) MG tablet Take 325 mg by mouth daily with breakfast.    . isosorbide mononitrate (IMDUR) 30 MG 24 hr tablet Take 1 tablet (30 mg total) by mouth daily. 90 tablet 3  . Multiple Vitamins-Minerals (EMERGEN-C VITAMIN C PO) Take 1 tablet by mouth daily.    . nitroGLYCERIN (NITROSTAT) 0.4 MG SL tablet Place 1 tablet (0.4 mg total) under the tongue every 5 (five) minutes as needed for chest pain. 25 tablet 1  . pantoprazole (PROTONIX) 40 MG tablet Take 40 mg by mouth daily. Acid reflux    . Polyvinyl Alcohol-Povidone (REFRESH OP) Place 1 drop into both eyes 2 (two) times daily.     [name]  Pseudoeph-Doxylamine-DM-APAP (NYQUIL MULTI-SYMPTOM PO) Take 1 capsule by mouth at bedtime as needed (for cold).    . temazepam (RESTORIL) 15 MG capsule Take 15 mg by mouth at bedtime as needed for sleep. SLEEP AID    . tiZANidine (ZANAFLEX) 2 MG tablet Take 2 mg by mouth every 6 (six) hours as needed for muscle spasms.    [name] zolpidem (AMBIEN) 5 MG tablet Take 5 mg by mouth at bedtime as needed for sleep.    . metoprolol succinate (TOPROL-XL) 25 MG 24 hr tablet Take 0.5 tablets (12.5 mg total) by mouth daily. 90 tablet 3   No facility-administered medications prior to visit.    Past Medical History:  Diagnosis Date  . Abnormal nuclear stress test 05/24/2015  .  C. difficile diarrhea on po vanc 10/04/2015  . CAD (coronary artery disease) 2006, 2016   a. 08/2014 NSTEMI: Attempted PCI of 85% RPDA - unable to wire, complicated by dissection, case aborted;  b. 08/2014 Relook Cath: LM nl, LAD 60p, SP1 90ost (small), D1 65ost, RI 45/50, RCA nl, RPDA 90 - healed dissection;  b. 08/2014 Echo: EF 55%, mild/mon midapical inferior HK, mild AI, PASP . c. Cath 05/2015 with CTO of the right-PDA and unsuccessful PCI  . chronic back   . CKD (chronic kidney disease), stage II   . COLONIC POLYPS, ADENOMATOUS, HX OF   . Diverticulitis   . Dyslipidemia    a. statin intolerant.  . Elevated troponin level 10/04/2015  . ENDOMETRIOSIS   . Essential hypertension   . Fibromyalgia   . GASTRIC ANTRAL VASCULAR ECTASIA   . GERD   . History of hiatal hernia   . Hypertension, uncontrolled 10/04/2015  . Insomnia   . IRON DEFICIENCY ANEMIA SECONDARY TO BLOOD LOSS   . Myocardial infarction 08/2014  . Neuropathy (HCC)   . Osteoarthritis    "back, hips, basically all over" (05/24/2015)  . Raynaud's syndrome   . Rosacea, acne   . Sicca syndrome (HCC)   . SJOGREN'S SYNDROME   . SUPRAVENTRICULAR TACHYCARDIA   . Vitamin D deficiency    Past Surgical History:  Procedure Laterality Date  . APPENDECTOMY    . BREAST BIOPSY Bilateral    "both were negative"  . BREAST LUMPECTOMY Right X 2  . BREAST LUMPECTOMY WITH RADIOACTIVE SEED LOCALIZATION Right 01/24/2014   Procedure: BREAST LUMPECTOMY WITH RADIOACTIVE SEED LOCALIZATION;  Surgeon: Avel Peace, MD;  Location: [facility];  Service: General;  Laterality: Right;  . CARDIAC CATHETERIZATION N/A 08/18/2014   Procedure: Left Heart Cath and Coronary Angiography;  Surgeon: Marykay Lex, MD;  Location: [facility] INVASIVE CV LAB;  Service: Cardiovascular;  Laterality: N/A;  . CARDIAC CATHETERIZATION  08/18/2014   Procedure: Coronary Balloon Angioplasty;  Surgeon: Marykay Lex, MD;  Location: [facility] INVASIVE CV LAB;  Service:  Cardiovascular;;  . CARDIAC CATHETERIZATION N/A 08/21/2014   Procedure: Left Heart Cath and Coronary Angiography;  Surgeon: Marykay Lex, MD;  Location: [facility] INVASIVE CV LAB;  Service: Cardiovascular;  Laterality: N/A;  . CARDIAC CATHETERIZATION  2006  . CARDIAC CATHETERIZATION N/A 05/24/2015   Procedure: Left Heart Cath and Coronary Angiography;  Surgeon: Tonny Bollman, MD;  Location: [facility] INVASIVE CV LAB;  Service: Cardiovascular;  Laterality: N/A;  . COLONOSCOPY W/ BIOPSIES AND POLYPECTOMY  12/17/2006   adenomatous polyps, external hemorrhoids  . ESOPHAGOGASTRODUODENOSCOPY  11/27/2009   APC tretment of lesions  . EYE SURGERY    . HERNIA REPAIR    . HIATAL HERNIA  REPAIR  2005   and paraesophageal  . LAPAROSCOPIC CHOLECYSTECTOMY    . TEAR DUCT PROBING Right    "no plugs or anything"  . TOTAL ABDOMINAL HYSTERECTOMY     w/BSO   Social History   Social History  . Marital status: Married    Spouse name: N/A  . Number of children: 3  . Years of education: college   Occupational History  . retired    Social History Main Topics  . Smoking status: Never Smoker  . Smokeless tobacco: Never Used  . Alcohol use Yes  . Drug use: No  . Sexual activity: Not Currently   Other Topics Concern  . Not on file   Social History Narrative   Patient drinks caffeine occasionally.   Patient is right handed.      Family History  Problem Relation Age of Onset  . Heart failure Mother   . Diabetes Mother   . Kidney failure Mother   . Hypertension Mother   . Coronary artery disease Mother   . Heart disease Mother   . Hyperlipidemia Mother   . Peripheral vascular disease Mother     amputation  . Breast cancer Sister     x 3 sisters  . Cancer Sister   . Heart disease Father   . Hypertension Father   . Coronary artery disease Father   . Heart attack Father   . Coronary artery disease Brother   . Heart disease Brother   . Coronary artery disease Brother   . Coronary artery disease Brother    . Breast cancer Sister   . Breast cancer Sister   . Breast cancer Daughter   . Cancer Daughter   . Colon cancer Neg Hx   . Esophageal cancer Neg Hx   . Stomach cancer Neg Hx   . Rectal cancer Neg Hx    Review of Systems As above - GI, muscskeletal, constitutional  Objective:   Physical Exam BP 126/72   Pulse 84   Ht 5\' 3"  (1.6 m)   Wt 133 lb (60.3 kg)   BMI 23.56 kg/m  Elderly, in pain - back Eyes anicteric Lungs cta Cor s1s2 no rmg abd is soft and mildly tender above umbilicus Back - mildly tender midline lumbosacral area

## 2016-01-11 ENCOUNTER — Encounter: Payer: Self-pay | Admitting: Cardiovascular Disease

## 2016-01-11 ENCOUNTER — Telehealth: Payer: Self-pay

## 2016-01-11 NOTE — Telephone Encounter (Signed)
Received fax from [facility] and her c-diff came back positive.  I called over to [facility] and per [facility] there she was sent in Vancomycin 125 mg to take one every 6 hours for 14 days. Will route to Dr Leone Payor.

## 2016-01-14 NOTE — Telephone Encounter (Signed)
Please let patient know I recommend the following treatment   Take the 125 mg vancomycin qid x 10 days  Then take 1 tablet every 3 days x 10 doses  This taper approach has a better success of treating without recurrence  Please cc this to her PCP also

## 2016-01-14 NOTE — Telephone Encounter (Signed)
Left message for patient to call back,.  

## 2016-01-14 NOTE — Telephone Encounter (Signed)
Patient notified of the results and recommendations.   We went over in detail days and dates to taper vancomycin.  She will call back for any additional questions or concerns.

## 2016-02-13 ENCOUNTER — Telehealth: Payer: Self-pay | Admitting: Internal Medicine

## 2016-02-13 MED ORDER — COLESTIPOL HCL 5 G PO GRAN: 5.0000 g | GRANULES | Freq: Every day | ORAL | 1 refills | Status: DC

## 2016-02-13 NOTE — Telephone Encounter (Signed)
Patient notified of the recommendations.  

## 2016-02-13 NOTE — Telephone Encounter (Signed)
Patient has completed the second round of vancomycin.  She reports "I can't really say that it improved on the vancomycin".  I put her on to see [name] next week.  She will only see you or a nurse not a PA.  Do you want any stool studies until appt next week?

## 2016-02-13 NOTE — Telephone Encounter (Signed)
No but have her take 5 grams of colestipol with supper - disp # 1 can If that is not an option use cholestyramine  Explain this can firm up stools and bind any toxin that may be around  I am mainly still suspicious of post-infectious IBS  It can take months for C diff test to turn negative and if stools not watery then unlikely to be persistent C diff

## 2016-02-15 ENCOUNTER — Telehealth: Payer: Self-pay | Admitting: Internal Medicine

## 2016-02-15 NOTE — Telephone Encounter (Signed)
Patient is doing better and has not started on the colestid yet.  She will keep it on hand and if the diarrhea returns she will try it.

## 2016-02-19 ENCOUNTER — Encounter: Payer: Self-pay | Admitting: Nurse Practitioner

## 2016-02-19 ENCOUNTER — Encounter (INDEPENDENT_AMBULATORY_CARE_PROVIDER_SITE_OTHER): Payer: Self-pay

## 2016-02-19 ENCOUNTER — Ambulatory Visit (INDEPENDENT_AMBULATORY_CARE_PROVIDER_SITE_OTHER): Payer: Medicare Other | Admitting: Nurse Practitioner

## 2016-02-19 VITALS — BP 142/78 | HR 66 | Ht 63.0 in | Wt 134.0 lb

## 2016-02-19 DIAGNOSIS — A0472 Enterocolitis due to Clostridium difficile, not specified as recurrent: Secondary | ICD-10-CM

## 2016-02-19 NOTE — Patient Instructions (Signed)
Continue your Florastor twice daily for another 30 days then you can stop.   Follow up with [place] as needed.

## 2016-02-19 NOTE — Progress Notes (Signed)
     HPI: Patient is a 80 year old female known to Dr. Leone Payor for history of recurrent C. difficile colitis.She had C-diff in April 2017 and July 2017.  She was seen here in late October with persistent loose/mushy stools, anorexia and fatigue. Stools studies (by PCP) were pending at the time but called to [place] a couple of days later and positive for C-diff again. We gave her a long Vancomycin taper and she is here for follow up.   Patient feels much better except for her chronic back pain. Stools are formed and having only one BM a day now. Colestid rx picked up from pharmacy but she didn't need it. Eating solids. No nausea, no abdominal pain.   Past Medical History:  Diagnosis Date  . Abnormal nuclear stress test 05/24/2015  . C. difficile diarrhea on po vanc 10/04/2015  . CAD (coronary artery disease) 2006, 2016   a. 08/2014 NSTEMI: Attempted PCI of 85% RPDA - unable to wire, complicated by dissection, case aborted;  b. 08/2014 Relook Cath: LM nl, LAD 60p, SP1 90ost (small), D1 65ost, RI 45/50, RCA nl, RPDA 90 - healed dissection;  b. 08/2014 Echo: EF 55%, mild/mon midapical inferior HK, mild AI, PASP . c. Cath 05/2015 with CTO of the right-PDA and unsuccessful PCI  . chronic back   . CKD (chronic kidney disease), stage II   . COLONIC POLYPS, ADENOMATOUS, HX OF   . Diverticulitis   . Dyslipidemia    a. statin intolerant.  . Elevated troponin level 10/04/2015  . ENDOMETRIOSIS   . Essential hypertension   . Fibromyalgia   . GASTRIC ANTRAL VASCULAR ECTASIA   . GERD   . History of hiatal hernia   . Hypertension, uncontrolled 10/04/2015  . Insomnia   . IRON DEFICIENCY ANEMIA SECONDARY TO BLOOD LOSS   . Myocardial infarction 08/2014  . Neuropathy (HCC)   . Osteoarthritis    "back, hips, basically all over" (05/24/2015)  . Raynaud's syndrome   . Rosacea, acne   . Sicca syndrome (HCC)   . SJOGREN'S SYNDROME   . SUPRAVENTRICULAR TACHYCARDIA   . Vitamin D deficiency     Patient's surgical  history, family medical history, social history, medications and allergies were all reviewed in Epic    Physical Exam: BP (!) 142/78   Pulse 66   Ht 5\' 3"  (1.6 m)   Wt 134 lb (60.8 kg)   BMI 23.74 kg/m   GENERAL: well developed white female in NAD PSYCH: :Pleasant, cooperative, normal affect HEENT: Normocephalic, conjunctiva pink, mucous membranes moist, neck supple without masses CARDIAC:  RRR, murmur heard, no peripheral edema PULM: Normal respiratory effort, lungs CTA bilaterally, no wheezing ABDOMEN:  soft, nontender, nondistended, no obvious masses, no hepatomegaly,  normal bowel sounds SKIN:  turgor, no lesions seen NEURO: Alert and oriented x 3, no focal neurologic deficits   ASSESSMENT and PLAN:  80 yo old female recurrent C-diff colitis in October (3rd episode) for which she just completed long Vancomycin taper.  Much better now with formed stools. Didn't require the Colestid. She had a complete colonoscopy January 2015 with findings of only diverticulosis. No follow routine colonoscopy planned.  -She just purchased another box of Florastor. Recommended she complete another 30 days of probiotics then can discontine  -she will call for recurrent problems. -Patient knows she is at risk for recurrent c-diff infection, especially with antibiotics.       Willette Cluster  02/19/2016, 3:09 PM

## 2016-02-23 NOTE — Progress Notes (Signed)
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  Agree with Ms. Guenther's assessment and plan. Iva Boop, MD, Clement[facility] Graham

## 2016-02-27 ENCOUNTER — Ambulatory Visit: Payer: Medicare Other | Admitting: Internal Medicine

## 2016-03-11 ENCOUNTER — Telehealth: Payer: Self-pay | Admitting: Internal Medicine

## 2016-03-11 NOTE — Telephone Encounter (Signed)
Patient reports that she is having clear liquid mucus that started on 03/09/16.  She had a formed BM this am then has had another episode of mucus filled stools.  She reports that she is having urgency with just mucus.  One incontinent episode this am .  Please advise next step

## 2016-03-11 NOTE — Telephone Encounter (Signed)
I spoke to her Stools mostly formed as indicated - sounds like still having post C diff IBS  Advised trying 1/2 scoop colestipol with supper and told her to call back prn  Explained may take some time to normalize  No need for repeat C diff testing at this time as not having watery stools and still could be colonized anyway  Please cc her PCP on note when completed

## 2016-03-21 ENCOUNTER — Telehealth: Payer: Self-pay | Admitting: Internal Medicine

## 2016-03-21 NOTE — Telephone Encounter (Signed)
Patient notified.  She will call back for additional questions or concerns 

## 2016-03-21 NOTE — Telephone Encounter (Signed)
Patient reports that her stools are solid with colestipol, but have a large amount of mucus. She is very concerned.  Please advise

## 2016-03-21 NOTE — Telephone Encounter (Signed)
I do not think that is dangerous or a real problem but I can call her later or by next week if she wants. Overall sounds improved.  This is not C diff

## 2016-03-21 NOTE — Telephone Encounter (Signed)
Left message for patient to call back  

## 2016-04-25 ENCOUNTER — Emergency Department (HOSPITAL_COMMUNITY)
Admission: EM | Admit: 2016-04-25 | Discharge: 2016-04-25 | Disposition: A | Discharge status: Home / Self Care | Payer: Medicare Other | Attending: Emergency Medicine | Admitting: Emergency Medicine

## 2016-04-25 ENCOUNTER — Encounter (HOSPITAL_COMMUNITY): Payer: Self-pay | Admitting: *Deleted

## 2016-04-25 ENCOUNTER — Telehealth: Payer: Self-pay | Admitting: Cardiovascular Disease

## 2016-04-25 ENCOUNTER — Emergency Department (HOSPITAL_COMMUNITY): Payer: Medicare Other

## 2016-04-25 DIAGNOSIS — I129 Hypertensive chronic kidney disease with stage 1 through stage 4 chronic kidney disease, or unspecified chronic kidney disease: Secondary | ICD-10-CM | POA: Diagnosis not present

## 2016-04-25 DIAGNOSIS — Z5321 Procedure and treatment not carried out due to patient leaving prior to being seen by health care provider: Secondary | ICD-10-CM | POA: Insufficient documentation

## 2016-04-25 DIAGNOSIS — Z7982 Long term (current) use of aspirin: Secondary | ICD-10-CM | POA: Diagnosis not present

## 2016-04-25 DIAGNOSIS — N182 Chronic kidney disease, stage 2 (mild): Secondary | ICD-10-CM | POA: Insufficient documentation

## 2016-04-25 DIAGNOSIS — I2510 Atherosclerotic heart disease of native coronary artery without angina pectoris: Secondary | ICD-10-CM | POA: Insufficient documentation

## 2016-04-25 DIAGNOSIS — R079 Chest pain, unspecified: Secondary | ICD-10-CM | POA: Diagnosis present

## 2016-04-25 DIAGNOSIS — Z79899 Other long term (current) drug therapy: Secondary | ICD-10-CM | POA: Insufficient documentation

## 2016-04-25 LAB — CBC
HEMATOCRIT: 35.6 % — AB (ref 36.0–46.0)
HEMOGLOBIN: 11.6 g/dL — AB (ref 12.0–15.0)
MCH: 27.3 pg (ref 26.0–34.0)
MCHC: 32.6 g/dL (ref 30.0–36.0)
MCV: 83.8 fL (ref 78.0–100.0)
Platelets: 237 10*3/uL (ref 150–400)
RBC: 4.25 MIL/uL (ref 3.87–5.11)
RDW: 14.7 % (ref 11.5–15.5)
WBC: 7.6 10*3/uL (ref 4.0–10.5)

## 2016-04-25 LAB — BASIC METABOLIC PANEL
ANION GAP: 8 (ref 5–15)
BUN: 15 mg/dL (ref 6–20)
CHLORIDE: 102 mmol/L (ref 101–111)
CO2: 23 mmol/L (ref 22–32)
Calcium: 9.9 mg/dL (ref 8.9–10.3)
Creatinine, Ser: 1.07 mg/dL — ABNORMAL HIGH (ref 0.44–1.00)
GFR calc Af Amer: 56 mL/min — ABNORMAL LOW (ref >60)
GFR, EST NON AFRICAN AMERICAN: 48 mL/min — AB (ref >60)
GLUCOSE: 110 mg/dL — AB (ref 65–99)
POTASSIUM: 4.4 mmol/L (ref 3.5–5.1)
Sodium: 133 mmol/L — ABNORMAL LOW (ref 135–145)

## 2016-04-25 LAB — I-STAT TROPONIN, ED: Troponin i, poc: 0 ng/mL (ref 0.00–0.08)

## 2016-04-25 NOTE — Telephone Encounter (Signed)
I discussed Dr Odessa Fleming recommendations with [name], [name] agreed to report to ED now for further evaluation. [name] advised not to drive.

## 2016-04-25 NOTE — ED Notes (Signed)
Patient did not answer after calling x 3 in lobby

## 2016-04-25 NOTE — Telephone Encounter (Signed)
I will forward to Dr Cooper for review.  

## 2016-04-25 NOTE — Telephone Encounter (Signed)
New Message  Pt has appt scheduled for April wants to be worked in sooner  Pt c/o of Chest Pain: STAT if CP now or developed within 24 hours  1. Are you having CP right now? No  2. Are you experiencing any other symptoms (ex. SOB, nausea, vomiting, sweating)? No  3. How long have you been experiencing CP? Last couple of days  4. Is your CP continuous or coming and going? Come and go  5. Have you taken Nitroglycerin? No ?

## 2016-04-25 NOTE — Telephone Encounter (Signed)
I called pt to see how she was feeling. Pt states since my last conversation with her earlier today she had an episode of pain in her elbow that lasted minutes not associated with any other symptoms. Pt states she is asymptomatic at this time. I reviewed with Dr Jeanine Luz recommended pt go to ED for further evaluation of symptoms.

## 2016-04-25 NOTE — ED Triage Notes (Signed)
Pt states L sided sharp chest pain starting this am that radiates to L neck, arm and jaw.  Pain was so severe it woke her up.  States increased sob when pain occurs.

## 2016-04-25 NOTE — Telephone Encounter (Signed)
Pt states she has had 2 episodes of chest pain in the last 2 days. Pt states yesterday she was in bed and felt a sharp pain in her chest that lasted seconds, associated with feeling her heart beat hard, denies any other acute symptoms.  Pt states this morning sitting at the kitchen tablet she the same symptoms. Pt denies having chest pain like this is the past.  Pt states BP 119/86, pulse 85, pt asymptomatic at this time. Pt states only other symptom is feeling of fatigue that is chronic but seems to have gotten worse.

## 2016-04-27 NOTE — Telephone Encounter (Signed)
Looks like she went to the ER but was not seen. Please arrange an APP visit this week for further evaluation. thanks

## 2016-04-28 ENCOUNTER — Telehealth: Payer: Self-pay | Admitting: Cardiovascular Disease

## 2016-04-28 NOTE — Telephone Encounter (Signed)
Follow up    Returning laurens Call

## 2016-04-28 NOTE — Telephone Encounter (Signed)
Follow up    Pt called returning [place] phone call.

## 2016-04-28 NOTE — Telephone Encounter (Signed)
Left message on machine for pt to contact the office.  Call goes straight to voicemail.

## 2016-04-28 NOTE — Telephone Encounter (Signed)
Left message on machine for pt to contact the office.   

## 2016-04-28 NOTE — Telephone Encounter (Signed)
Pt called requesting results of test she had done at [facility] over the weekend, had chest pain and arm numbness, waited 4 hours and left-pls call cell

## 2016-04-28 NOTE — Telephone Encounter (Signed)
I spoke with the pt and she wanted results of lab work and chest x-ray from the ER.  The pt states that she has not had any further CP since her ER visit.  The pt does continue to have discomfort in left side of face, jaw and left arm down to finger tips. Discomfort does not worsen with movement and her symptoms "come and go to some degree but are difficult to describe". The pt denies any sinus, ear or dental issues at this time.  The pt is scheduled to see her PCP tomorrow morning and I will follow-up with her after this appointment. Pt agreed with plan.

## 2016-04-29 ENCOUNTER — Other Ambulatory Visit: Payer: Self-pay | Admitting: Family Medicine

## 2016-04-29 DIAGNOSIS — R202 Paresthesia of skin: Secondary | ICD-10-CM

## 2016-04-29 NOTE — Telephone Encounter (Signed)
Left message on machine for pt to contact the office.   

## 2016-04-30 NOTE — Telephone Encounter (Signed)
I left a voicemail message on both the pt's home and mobile number to contact the office.

## 2016-04-30 NOTE — Telephone Encounter (Signed)
Follow up  ° °Patient returning call back to nurse from today .  °

## 2016-04-30 NOTE — Telephone Encounter (Signed)
Left message on machine for pt to contact the office.   

## 2016-04-30 NOTE — Telephone Encounter (Signed)
Patient is returning your call,thanks. °

## 2016-05-06 ENCOUNTER — Encounter: Payer: Self-pay | Admitting: Cardiovascular Disease

## 2016-05-06 NOTE — Telephone Encounter (Signed)
This encounter was created in error - please disregard.

## 2016-05-06 NOTE — Telephone Encounter (Signed)
Follow up    Pt is returning [name] call about ER visit, pt states she had tests done through the ER but she left when it got dark out she can not see to drive in the dark

## 2016-05-06 NOTE — Telephone Encounter (Signed)
Follow Up:     Returning your call from last week.Pt says she did not know what the call was about.

## 2016-05-06 NOTE — Telephone Encounter (Signed)
I spoke with the pt and she did not even recall my conversation with her last week in regards to her ER visit.  At this time the pt said she is symptom free. The pt is currently on Dr Earmon Phoenix wait list for an appointment and I have scheduled her to follow-up on 05/15/16.

## 2016-05-07 ENCOUNTER — Ambulatory Visit
Admission: RE | Admit: 2016-05-07 | Discharge: 2016-05-07 | Disposition: A | Discharge status: Home / Self Care | Payer: Medicare Other | Source: Ambulatory Visit | Attending: Family Medicine | Admitting: Family Medicine

## 2016-05-07 DIAGNOSIS — R202 Paresthesia of skin: Secondary | ICD-10-CM

## 2016-05-15 ENCOUNTER — Encounter: Payer: Self-pay | Admitting: Cardiovascular Disease

## 2016-05-15 ENCOUNTER — Encounter (INDEPENDENT_AMBULATORY_CARE_PROVIDER_SITE_OTHER): Payer: Self-pay

## 2016-05-15 ENCOUNTER — Ambulatory Visit (INDEPENDENT_AMBULATORY_CARE_PROVIDER_SITE_OTHER): Payer: Medicare Other | Admitting: Cardiovascular Disease

## 2016-05-15 VITALS — BP 110/68 | HR 86 | Ht 62.0 in | Wt 133.0 lb

## 2016-05-15 DIAGNOSIS — I10 Essential (primary) hypertension: Secondary | ICD-10-CM

## 2016-05-15 NOTE — Patient Instructions (Signed)

## 2016-05-15 NOTE — Progress Notes (Signed)
Cardiology Office Note Date:  05/15/2016   ID:  AVY BARLETT, DOB May 02, 1935, MRN [id number]  PCP:  Cala Bradford, MD  Cardiologist:  Tonny Bollman, MD    Chief Complaint  Patient presents with  . Shortness of Breath     History of Present Illness: [name] is a 81 y.o. female who presents for follow-up of CAD. The patient presented with non-ST elevation infarction in 2016. She was noted to have severe stenosis in a PDA branch and PCI was attempted. This was complicated by localized dissection. Medical therapy was pursued. However, in February 2017 she developed worsening exercise tolerance and dyspnea with low-level activity. A stress test showed ischemia in the PDA territory but also in the LAD territory. The stress test was high-risk and cardiac catheterization was done. Stem is rated total occlusion of the PDA. The LAD that was felt to have nonobstructive disease. PCI was attempted but failed because of balloon could not be passed across the total occlusion. The patient has been managed medically since that time. Films were reviewed with interventional colleagues who all agree that medical therapy was the best treatment option for this patient.  The patient is here alone today. She complains of fatigue and shortness of breath with physical activity. She's had no recent chest pain or pressure. She denies any intercurrent illness since her last evaluation here. No orthopnea, PND, or leg swelling. Fairly limited with shortness of breath based on her report of symptoms.  Past Medical History:  Diagnosis Date  . Abnormal nuclear stress test 05/24/2015  . C. difficile diarrhea on po vanc 10/04/2015  . CAD (coronary artery disease) 2006, 2016   a. 08/2014 NSTEMI: Attempted PCI of 85% RPDA - unable to wire, complicated by dissection, case aborted;  b. 08/2014 Relook Cath: LM nl, LAD 60p, SP1 90ost (small), D1 65ost, RI 45/50, RCA nl, RPDA 90 - healed dissection;  b. 08/2014 Echo: EF 55%,  mild/mon midapical inferior HK, mild AI, PASP . c. Cath 05/2015 with CTO of the right-PDA and unsuccessful PCI  . chronic back   . CKD (chronic kidney disease), stage II   . COLONIC POLYPS, ADENOMATOUS, HX OF   . Diverticulitis   . Dyslipidemia    a. statin intolerant.  . Elevated troponin level 10/04/2015  . ENDOMETRIOSIS   . Essential hypertension   . Fibromyalgia   . GASTRIC ANTRAL VASCULAR ECTASIA   . GERD   . History of hiatal hernia   . Hypertension, uncontrolled 10/04/2015  . Insomnia   . IRON DEFICIENCY ANEMIA SECONDARY TO BLOOD LOSS   . Myocardial infarction 08/2014  . Neuropathy (HCC)   . Osteoarthritis    "back, hips, basically all over" (05/24/2015)  . Raynaud's syndrome   . Rosacea, acne   . Sicca syndrome (HCC)   . SJOGREN'S SYNDROME   . SUPRAVENTRICULAR TACHYCARDIA   . Vitamin D deficiency     Past Surgical History:  Procedure Laterality Date  . APPENDECTOMY    . BREAST BIOPSY Bilateral    "both were negative"  . BREAST LUMPECTOMY Right X 2  . BREAST LUMPECTOMY WITH RADIOACTIVE SEED LOCALIZATION Right 01/24/2014   Procedure: BREAST LUMPECTOMY WITH RADIOACTIVE SEED LOCALIZATION;  Surgeon: Avel Peace, MD;  Location: [facility];  Service: General;  Laterality: Right;  . CARDIAC CATHETERIZATION N/A 08/18/2014   Procedure: Left Heart Cath and Coronary Angiography;  Surgeon: Marykay Lex, MD;  Location: [facility] INVASIVE CV LAB;  Service: Cardiovascular;  Laterality:  N/A;  . CARDIAC CATHETERIZATION  08/18/2014   Procedure: Coronary Balloon Angioplasty;  Surgeon: Marykay Lex, MD;  Location: [facility] INVASIVE CV LAB;  Service: Cardiovascular;;  . CARDIAC CATHETERIZATION N/A 08/21/2014   Procedure: Left Heart Cath and Coronary Angiography;  Surgeon: Marykay Lex, MD;  Location: [facility] INVASIVE CV LAB;  Service: Cardiovascular;  Laterality: N/A;  . CARDIAC CATHETERIZATION  2006  . CARDIAC CATHETERIZATION N/A 05/24/2015   Procedure: Left Heart Cath and Coronary  Angiography;  Surgeon: Tonny Bollman, MD;  Location: [facility] INVASIVE CV LAB;  Service: Cardiovascular;  Laterality: N/A;  . COLONOSCOPY W/ BIOPSIES AND POLYPECTOMY  12/17/2006   adenomatous polyps, external hemorrhoids  . ESOPHAGOGASTRODUODENOSCOPY  11/27/2009   APC tretment of lesions  . EYE SURGERY    . HERNIA REPAIR    . HIATAL HERNIA REPAIR  2005   and paraesophageal  . LAPAROSCOPIC CHOLECYSTECTOMY    . TEAR DUCT PROBING Right    "no plugs or anything"  . TOTAL ABDOMINAL HYSTERECTOMY     w/BSO    Current Outpatient Prescriptions  Medication Sig Dispense Refill  . acetaminophen (TYLENOL) 500 MG tablet Take 1,000 mg by mouth every 6 (six) hours as needed (pain).    [name] amLODipine (NORVASC) 2.5 MG tablet Take 2.5 mg by mouth daily.    [name] aspirin 81 MG tablet Take 81 mg by mouth daily.    . Biotin 10 MG TABS Take 10 mg by mouth daily.    . calcium carbonate (ANTACID) 420 MG CHEW chewable tablet Chew 420 mg by mouth daily.    . clopidogrel (PLAVIX) 75 MG tablet Take 1 tablet (75 mg total) by mouth daily with breakfast. 90 tablet 3  . colestipol (COLESTID) 5 g granules Take 5 g by mouth daily with supper. 500 g 1  . cyclobenzaprine (FLEXERIL) 10 MG tablet Take 10 mg by mouth 3 (three) times daily as needed for muscle spasms.    . ferrous sulfate 325 (65 FE) MG tablet Take 325 mg by mouth daily with breakfast.    . fluticasone (FLONASE ALLERGY RELIEF) 50 MCG/ACT nasal spray Place 1 spray into the nose daily as needed for allergies.    . isosorbide mononitrate (IMDUR) 30 MG 24 hr tablet Take 1 tablet (30 mg total) by mouth daily. 90 tablet 3  . loratadine (CLARITIN) 10 MG tablet Take 10 mg by mouth daily.    . metoprolol succinate (TOPROL-XL) 25 MG 24 hr tablet Take 12.5 mg by mouth daily.    . Multiple Vitamins-Minerals (EMERGEN-C VITAMIN C PO) Take 1 tablet by mouth daily.    . nitroGLYCERIN (NITROSTAT) 0.4 MG SL tablet Place 1 tablet (0.4 mg total) under the tongue every 5 (five) minutes as  needed for chest pain. 25 tablet 1  . pantoprazole (PROTONIX) 40 MG tablet Take 40 mg by mouth daily. Acid reflux    . Polyvinyl Alcohol-Povidone (REFRESH OP) Place 1 drop into both eyes 2 (two) times daily.     . Pseudoeph-Doxylamine-DM-APAP (NYQUIL MULTI-SYMPTOM PO) Take 1 capsule by mouth at bedtime as needed (for cold).    . saccharomyces boulardii (FLORASTOR) 250 MG capsule Take 250 mg by mouth 2 (two) times daily.    . temazepam (RESTORIL) 15 MG capsule Take 15 mg by mouth at bedtime as needed for sleep. SLEEP AID    . tiZANidine (ZANAFLEX) 2 MG tablet Take 2 mg by mouth every 6 (six) hours as needed for muscle spasms.    . traZODone (DESYREL) 50  MG tablet Take half (1/2) to one (1) tablet (25 mg to 50 mg total) by mouth daily at bedtime as needed for sleep.     No current facility-administered medications for this visit.     Allergies:   Ezetimibe; Ezetimibe-simvastatin; Morphine; Rosuvastatin; Ambien [zolpidem tartrate]; Plaquenil [hydroxychloroquine sulfate]; Polytrim [polymyxin b-trimethoprim]; and Prevacid [lansoprazole]   Social History:  The patient  reports that she has never smoked. She has never used smokeless tobacco. She reports that she drinks alcohol. She reports that she does not use drugs.   Family History:  The patient's  family history includes Breast cancer in her daughter, sister, sister, and sister; Cancer in her daughter and sister; Coronary artery disease in her brother, brother, brother, father, and mother; Diabetes in her mother; Heart attack in her father; Heart disease in her brother, father, and mother; Heart failure in her mother; Hyperlipidemia in her mother; Hypertension in her father and mother; Kidney failure in her mother; Peripheral vascular disease in her mother.   ROS:  Please see the history of present illness.  All other systems are reviewed and negative.   PHYSICAL EXAM: VS:  BP 110/68 (BP Location: Left Arm)   Pulse 86   Ht 5\' 2"  (1.575 m)   Wt  133 lb (60.3 kg)   BMI 24.33 kg/m  , BMI Body mass index is 24.33 kg/m. GEN: Well nourished, well developed, in no acute distress  HEENT: normal  Neck: no JVD, no masses. No carotid bruits Cardiac: RRR with 2/6 SEM at the RUSB        Respiratory:  clear to auscultation bilaterally, normal work of breathing GI: soft, nontender, nondistended, + BS MS: no deformity or atrophy  Ext: no pretibial edema, pedal pulses 2+= bilaterally Skin: warm and dry, no rash Neuro:  Strength and sensation are intact Psych: euthymic mood, full affect  EKG:  EKG is not ordered today.  Recent Labs: 10/21/2015: ALT 14 04/25/2016: BUN 15; Creatinine, Ser 1.07; Hemoglobin 11.6; Platelets 237; Potassium 4.4; Sodium 133   Lipid Panel     Component Value Date/Time   CHOL 162 08/19/2014 0255   TRIG 98 08/19/2014 0255   HDL 54 08/19/2014 0255   CHOLHDL 3.0 08/19/2014 0255   VLDL 20 08/19/2014 0255   LDLCALC 88 08/19/2014 0255      Wt Readings from Last 3 Encounters:  05/15/16 133 lb (60.3 kg)  04/25/16 125 lb (56.7 kg)  02/19/16 134 lb (60.8 kg)     Cardiac Studies Reviewed: Echo 05-17-2015: Study Conclusions  - Left ventricle: The cavity size was normal. Wall thickness was   normal. Systolic function was normal. The estimated ejection   fraction was in the range of 55% to 60%. Wall motion was normal;   there were no regional wall motion abnormalities. Doppler   parameters are consistent with abnormal left ventricular   relaxation (grade 1 diastolic dysfunction). - Aortic valve: There was mild stenosis. There was mild   regurgitation. Mean gradient (S): 8 mm Hg. Peak gradient (S): 16   mm Hg. - Mitral valve: Calcified annulus. Mildly thickened leaflets . - Pulmonary arteries: PA peak pressure: 34 mm Hg (S).  Myoview Scan 05/17/2015: Study Highlights    The left ventricular ejection fraction is normal (55-65%).  Nuclear stress EF: 55%.  Blood pressure demonstrated a hypotensive response to  exercise.  There was no ST segment deviation noted during stress.  No T wave inversion was noted during stress.  Defect 1: There is  a large defect of moderate severity present in the basal inferolateral, mid inferolateral, apical inferior, apical lateral and apex location.  This is a high risk study.  Findings consistent with ischemia.   High risk stress nuclear study with ischemia in the territory of a known right coronary artery lesion, but also with ischemia in the distribution of the distal LAD artery, new when compared to study from 2015. Normal left ventricular regional and global systolic function.   Cardiac Cath 05-24-2015: Conclusion    Prox RCA lesion, 25% stenosed.  Dist RCA lesion, 25% stenosed.  Prox Cx to Mid Cx lesion, 50% stenosed.  Prox LAD lesion, 50% stenosed.  Dist LAD lesion, 25% stenosed.  RPDA lesion, 100% stenosed. Post intervention, there is a 100% residual stenosis.   1. Severe single-vessel coronary artery disease with total occlusion of the right PDA, attempted PCI unsuccessful due to inability to cross lesion  2. Heavily calcified coronary arteries with mild to moderate diffuse CAD involving the LAD and left circumflex, appropriate for medical therapy  3. Normal LV function by noninvasive assessment   CXR 04-25-2016: FINDINGS: Normal heart size, mediastinal contours, and pulmonary vascularity.  Atherosclerotic calcification aorta.  Lungs clear.  No pleural effusion or pneumothorax.  Bones demineralized.  IMPRESSION: No acute abnormalities.  Aortic atherosclerosis.  ASSESSMENT AND PLAN: 1.  CAD, native vessel, without angina: pt stable on current medical therapy. I don't think she is having clear anginal symptoms at this point and after extensive discussions with colleagues regarding her coronary anatomy and review of her cardiac catheterization films, we all agree that ongoing medical therapy is the best approach.  2. HTN:  Continue current medications. Adjustments were made at the time of her last office visit as we felt she was overtreated at that time.  3. Dyslipidemia: unable to tolerate statin drugs or ezetimibe. Continue lifestyle modification.  4. Shortness of breath: Patient with fatigue and dyspnea. No evidence of volume excess on her physical exam. We discussed consideration of cardiopulmonary exercise testing, but I think this is unlikely to alter her treatment at this point. She is not inclined to pursue any further testing.  Current medicines are reviewed with the patient today.  The patient does not have concerns regarding medicines.  Labs/ tests ordered today include:  No orders of the defined types were placed in this encounter.   Disposition:   FU 6 months  Signed, Tonny Bollman, MD  05/15/2016 2:02 PM    [facility] [address] [place], [place], [place]  [address] Phone: [phone]; Fax: [phone]

## 2016-06-18 ENCOUNTER — Telehealth: Payer: Self-pay | Admitting: Internal Medicine

## 2016-06-18 DIAGNOSIS — R197 Diarrhea, unspecified: Secondary | ICD-10-CM

## 2016-06-18 NOTE — Telephone Encounter (Signed)
Patient reports 7-8 watery "orange/yellow" stools a day with urgency.  She is weak.  She is advised to push fluids and remain on a very bland diet.  She is advised that I will call her tomorrow once Dr. Percell Miller has a chance to review

## 2016-06-19 ENCOUNTER — Other Ambulatory Visit: Payer: Medicare Other

## 2016-06-19 DIAGNOSIS — R42 Dizziness and giddiness: Secondary | ICD-10-CM

## 2016-06-19 MED ORDER — COLESTIPOL HCL 5 G PO GRAN: 5.0000 g | GRANULES | Freq: Every day | ORAL | 3 refills | Status: DC

## 2016-06-19 NOTE — Telephone Encounter (Signed)
I spoke to her  Plan is:  1) hydrate as best she can - gatorade, broth etc 2) refill colestipol - [facility] 3) C diff PCR today

## 2016-06-19 NOTE — Telephone Encounter (Signed)
Orders placed.

## 2016-06-20 ENCOUNTER — Other Ambulatory Visit: Payer: Self-pay

## 2016-06-20 LAB — CLOSTRIDIUM DIFFICILE BY PCR: CDIFFPCR: DETECTED — AB

## 2016-06-20 MED ORDER — VANCOMYCIN HCL 125 MG PO CAPS: 125.0000 mg | ORAL_CAPSULE | Freq: Four times a day (QID) | ORAL | 0 refills | Status: DC

## 2016-06-20 NOTE — Progress Notes (Signed)
She has C diff again  Vancomycin 125 mg qid x 10 days then do 125 mg once every 3 days x 10 doses  # 50   Check with her pharmacy of choice to see if they have it and if not I would try [facility]   Have her call me back if not getting improvement by Mon/Tues and she will need a f/u w/ me after tx completed or when on the taper part

## 2016-06-23 ENCOUNTER — Encounter (HOSPITAL_COMMUNITY): Payer: Self-pay | Admitting: Emergency Medicine

## 2016-06-23 ENCOUNTER — Telehealth: Payer: Self-pay | Admitting: Internal Medicine

## 2016-06-23 ENCOUNTER — Emergency Department (HOSPITAL_COMMUNITY): Payer: Medicare Other

## 2016-06-23 ENCOUNTER — Observation Stay (HOSPITAL_COMMUNITY)
Admission: EM | Admit: 2016-06-23 | Discharge: 2016-06-24 | Disposition: A | Discharge status: Home / Self Care | Payer: Medicare Other | Attending: Internal Medicine | Admitting: Internal Medicine

## 2016-06-23 DIAGNOSIS — I252 Old myocardial infarction: Secondary | ICD-10-CM | POA: Diagnosis not present

## 2016-06-23 DIAGNOSIS — Z7982 Long term (current) use of aspirin: Secondary | ICD-10-CM | POA: Insufficient documentation

## 2016-06-23 DIAGNOSIS — E860 Dehydration: Secondary | ICD-10-CM | POA: Diagnosis present

## 2016-06-23 DIAGNOSIS — E785 Hyperlipidemia, unspecified: Secondary | ICD-10-CM | POA: Diagnosis not present

## 2016-06-23 DIAGNOSIS — A414 Sepsis due to anaerobes: Secondary | ICD-10-CM | POA: Insufficient documentation

## 2016-06-23 DIAGNOSIS — N182 Chronic kidney disease, stage 2 (mild): Secondary | ICD-10-CM | POA: Insufficient documentation

## 2016-06-23 DIAGNOSIS — M8580 Other specified disorders of bone density and structure, unspecified site: Secondary | ICD-10-CM | POA: Diagnosis not present

## 2016-06-23 DIAGNOSIS — K31819 Angiodysplasia of stomach and duodenum without bleeding: Secondary | ICD-10-CM | POA: Diagnosis not present

## 2016-06-23 DIAGNOSIS — I2510 Atherosclerotic heart disease of native coronary artery without angina pectoris: Secondary | ICD-10-CM

## 2016-06-23 DIAGNOSIS — K219 Gastro-esophageal reflux disease without esophagitis: Secondary | ICD-10-CM | POA: Diagnosis present

## 2016-06-23 DIAGNOSIS — Z9861 Coronary angioplasty status: Secondary | ICD-10-CM | POA: Diagnosis not present

## 2016-06-23 DIAGNOSIS — I7300 Raynaud's syndrome without gangrene: Secondary | ICD-10-CM | POA: Insufficient documentation

## 2016-06-23 DIAGNOSIS — G4700 Insomnia, unspecified: Secondary | ICD-10-CM | POA: Diagnosis not present

## 2016-06-23 DIAGNOSIS — A0472 Enterocolitis due to Clostridium difficile, not specified as recurrent: Secondary | ICD-10-CM | POA: Diagnosis not present

## 2016-06-23 DIAGNOSIS — M797 Fibromyalgia: Secondary | ICD-10-CM | POA: Diagnosis not present

## 2016-06-23 DIAGNOSIS — Z7951 Long term (current) use of inhaled steroids: Secondary | ICD-10-CM | POA: Insufficient documentation

## 2016-06-23 DIAGNOSIS — I10 Essential (primary) hypertension: Secondary | ICD-10-CM | POA: Diagnosis present

## 2016-06-23 DIAGNOSIS — M1990 Unspecified osteoarthritis, unspecified site: Secondary | ICD-10-CM | POA: Diagnosis not present

## 2016-06-23 DIAGNOSIS — I2582 Chronic total occlusion of coronary artery: Secondary | ICD-10-CM | POA: Insufficient documentation

## 2016-06-23 DIAGNOSIS — M3500 Sicca syndrome, unspecified: Secondary | ICD-10-CM | POA: Diagnosis not present

## 2016-06-23 DIAGNOSIS — N281 Cyst of kidney, acquired: Secondary | ICD-10-CM | POA: Insufficient documentation

## 2016-06-23 DIAGNOSIS — I471 Supraventricular tachycardia: Secondary | ICD-10-CM | POA: Insufficient documentation

## 2016-06-23 DIAGNOSIS — E559 Vitamin D deficiency, unspecified: Secondary | ICD-10-CM | POA: Diagnosis not present

## 2016-06-23 DIAGNOSIS — G629 Polyneuropathy, unspecified: Secondary | ICD-10-CM | POA: Insufficient documentation

## 2016-06-23 DIAGNOSIS — I129 Hypertensive chronic kidney disease with stage 1 through stage 4 chronic kidney disease, or unspecified chronic kidney disease: Secondary | ICD-10-CM | POA: Diagnosis not present

## 2016-06-23 DIAGNOSIS — I700 Atherosclerosis of aorta: Secondary | ICD-10-CM | POA: Diagnosis not present

## 2016-06-23 DIAGNOSIS — Z7902 Long term (current) use of antithrombotics/antiplatelets: Secondary | ICD-10-CM | POA: Insufficient documentation

## 2016-06-23 DIAGNOSIS — A419 Sepsis, unspecified organism: Secondary | ICD-10-CM | POA: Diagnosis present

## 2016-06-23 DIAGNOSIS — D631 Anemia in chronic kidney disease: Secondary | ICD-10-CM | POA: Diagnosis not present

## 2016-06-23 HISTORY — DX: Enterocolitis due to Clostridium difficile, not specified as recurrent: A04.72

## 2016-06-23 LAB — CBC
HCT: 39.5 % (ref 36.0–46.0)
HEMOGLOBIN: 13.2 g/dL (ref 12.0–15.0)
MCH: 26.5 pg (ref 26.0–34.0)
MCHC: 33.4 g/dL (ref 30.0–36.0)
MCV: 79.3 fL (ref 78.0–100.0)
PLATELETS: 264 10*3/uL (ref 150–400)
RBC: 4.98 MIL/uL (ref 3.87–5.11)
RDW: 14.6 % (ref 11.5–15.5)
WBC: 17.3 10*3/uL — AB (ref 4.0–10.5)

## 2016-06-23 LAB — URINALYSIS, ROUTINE W REFLEX MICROSCOPIC
Bilirubin Urine: NEGATIVE
GLUCOSE, UA: NEGATIVE mg/dL
Hgb urine dipstick: NEGATIVE
Ketones, ur: NEGATIVE mg/dL
LEUKOCYTES UA: NEGATIVE
Nitrite: NEGATIVE
PH: 6 (ref 5.0–8.0)
Protein, ur: NEGATIVE mg/dL
SPECIFIC GRAVITY, URINE: 1.003 — AB (ref 1.005–1.030)

## 2016-06-23 LAB — COMPREHENSIVE METABOLIC PANEL
ALBUMIN: 3.4 g/dL — AB (ref 3.5–5.0)
ALK PHOS: 71 U/L (ref 38–126)
ALT: 12 U/L — ABNORMAL LOW (ref 14–54)
AST: 22 U/L (ref 15–41)
Anion gap: 10 (ref 5–15)
BILIRUBIN TOTAL: 0.8 mg/dL (ref 0.3–1.2)
BUN: 13 mg/dL (ref 6–20)
CALCIUM: 9.5 mg/dL (ref 8.9–10.3)
CO2: 22 mmol/L (ref 22–32)
Chloride: 100 mmol/L — ABNORMAL LOW (ref 101–111)
Creatinine, Ser: 1.03 mg/dL — ABNORMAL HIGH (ref 0.44–1.00)
GFR calc Af Amer: 59 mL/min — ABNORMAL LOW (ref >60)
GFR calc non Af Amer: 51 mL/min — ABNORMAL LOW (ref >60)
GLUCOSE: 110 mg/dL — AB (ref 65–99)
POTASSIUM: 4.1 mmol/L (ref 3.5–5.1)
Sodium: 132 mmol/L — ABNORMAL LOW (ref 135–145)
Total Protein: 6.4 g/dL — ABNORMAL LOW (ref 6.5–8.1)

## 2016-06-23 LAB — LIPASE, BLOOD: Lipase: 16 U/L (ref 11–51)

## 2016-06-23 LAB — LACTIC ACID, PLASMA: Lactic Acid, Venous: 3 mmol/L (ref 0.5–1.9)

## 2016-06-23 MED ORDER — SODIUM CHLORIDE 0.9% FLUSH: 3.0000 mL | Freq: Two times a day (BID) | INTRAVENOUS | Status: DC

## 2016-06-23 MED ORDER — CARBOXYMETHYLCELLULOSE SODIUM 1 % OP SOLN: 1.0000 [drp] | Freq: Two times a day (BID) | OPHTHALMIC | Status: DC

## 2016-06-23 MED ORDER — PANTOPRAZOLE SODIUM 40 MG PO TBEC: 40.0000 mg | DELAYED_RELEASE_TABLET | Freq: Every day | ORAL | Status: DC

## 2016-06-23 MED ORDER — LORATADINE 10 MG PO TABS
10.0000 mg | ORAL_TABLET | Freq: Every day | ORAL | Status: DC
  Administered 2016-06-23 – 2016-06-24 (×2): 10 mg via ORAL
  Filled 2016-06-23 (×2): qty 1

## 2016-06-23 MED ORDER — TEMAZEPAM 15 MG PO CAPS: 15.0000 mg | ORAL_CAPSULE | Freq: Every evening | ORAL | Status: DC | PRN

## 2016-06-23 MED ORDER — FAMOTIDINE 20 MG PO TABS
20.0000 mg | ORAL_TABLET | Freq: Every day | ORAL | Status: DC
  Administered 2016-06-23 – 2016-06-24 (×2): 20 mg via ORAL
  Filled 2016-06-23 (×2): qty 1

## 2016-06-23 MED ORDER — IOPAMIDOL (ISOVUE-300) INJECTION 61%
INTRAVENOUS | Status: AC
  Administered 2016-06-23: 100 mL via INTRAVENOUS
  Filled 2016-06-23: qty 100

## 2016-06-23 MED ORDER — CLOPIDOGREL BISULFATE 75 MG PO TABS
75.0000 mg | ORAL_TABLET | Freq: Every day | ORAL | Status: DC
  Administered 2016-06-24: 75 mg via ORAL
  Filled 2016-06-23: qty 1

## 2016-06-23 MED ORDER — EMERGEN-C VITAMIN C PO PACK: 1.0000 | PACK | Freq: Every day | ORAL | Status: DC

## 2016-06-23 MED ORDER — FLUTICASONE PROPIONATE 50 MCG/ACT NA SUSP
1.0000 | Freq: Every day | NASAL | Status: DC | PRN
  Filled 2016-06-23: qty 16

## 2016-06-23 MED ORDER — SACCHAROMYCES BOULARDII 250 MG PO CAPS
250.0000 mg | ORAL_CAPSULE | Freq: Two times a day (BID) | ORAL | Status: DC
  Administered 2016-06-24 (×2): 250 mg via ORAL
  Filled 2016-06-23 (×2): qty 1

## 2016-06-23 MED ORDER — ONDANSETRON HCL 4 MG/2ML IJ SOLN: 4.0000 mg | Freq: Three times a day (TID) | INTRAMUSCULAR | Status: DC | PRN

## 2016-06-23 MED ORDER — ACETAMINOPHEN 325 MG PO TABS: 650.0000 mg | ORAL_TABLET | Freq: Four times a day (QID) | ORAL | Status: DC | PRN

## 2016-06-23 MED ORDER — ISOSORBIDE MONONITRATE ER 30 MG PO TB24
30.0000 mg | ORAL_TABLET | Freq: Every day | ORAL | Status: DC
  Administered 2016-06-24: 30 mg via ORAL
  Filled 2016-06-23: qty 1

## 2016-06-23 MED ORDER — COLESTIPOL HCL 5 G PO GRAN: 5.0000 g | GRANULES | Freq: Every day | ORAL | Status: DC

## 2016-06-23 MED ORDER — ENSURE ENLIVE PO LIQD: 237.0000 mL | Freq: Two times a day (BID) | ORAL | Status: DC

## 2016-06-23 MED ORDER — NITROGLYCERIN 0.4 MG SL SUBL: 0.4000 mg | SUBLINGUAL_TABLET | SUBLINGUAL | Status: DC | PRN

## 2016-06-23 MED ORDER — COLESTIPOL HCL 5 G PO PACK: 5.0000 g | PACK | Freq: Every day | ORAL | Status: DC

## 2016-06-23 MED ORDER — VANCOMYCIN HCL 125 MG PO CAPS
125.0000 mg | ORAL_CAPSULE | Freq: Four times a day (QID) | ORAL | Status: DC
  Filled 2016-06-23: qty 1

## 2016-06-23 MED ORDER — METOPROLOL SUCCINATE 12.5 MG HALF TABLET
12.5000 mg | ORAL_TABLET | Freq: Every day | ORAL | Status: DC
  Administered 2016-06-24: 12.5 mg via ORAL
  Filled 2016-06-23: qty 1

## 2016-06-23 MED ORDER — TRAZODONE HCL 50 MG PO TABS: 50.0000 mg | ORAL_TABLET | Freq: Every evening | ORAL | Status: DC | PRN

## 2016-06-23 MED ORDER — FERROUS SULFATE 325 (65 FE) MG PO TABS
325.0000 mg | ORAL_TABLET | Freq: Every day | ORAL | Status: DC
  Administered 2016-06-24: 325 mg via ORAL
  Filled 2016-06-23: qty 1

## 2016-06-23 MED ORDER — POLYVINYL ALCOHOL 1.4 % OP SOLN
1.0000 [drp] | Freq: Two times a day (BID) | OPHTHALMIC | Status: DC
  Filled 2016-06-23: qty 15

## 2016-06-23 MED ORDER — BIOTIN 10 MG PO TABS: 10.0000 mg | ORAL_TABLET | Freq: Every day | ORAL | Status: DC

## 2016-06-23 MED ORDER — CALCIUM CARBONATE ANTACID 420 MG PO CHEW
420.0000 mg | CHEWABLE_TABLET | Freq: Every day | ORAL | Status: DC
  Filled 2016-06-23: qty 1

## 2016-06-23 MED ORDER — SODIUM CHLORIDE 0.9 % IV BOLUS (SEPSIS)
1000.0000 mL | Freq: Once | INTRAVENOUS | Status: AC
  Administered 2016-06-23: 1000 mL via INTRAVENOUS

## 2016-06-23 MED ORDER — AMLODIPINE BESYLATE 5 MG PO TABS
2.5000 mg | ORAL_TABLET | Freq: Every day | ORAL | Status: DC
  Administered 2016-06-24: 2.5 mg via ORAL
  Filled 2016-06-23: qty 1

## 2016-06-23 MED ORDER — SODIUM CHLORIDE 0.9 % IV SOLN
INTRAVENOUS | Status: DC
  Administered 2016-06-24: 01:00:00 via INTRAVENOUS

## 2016-06-23 MED ORDER — ASPIRIN EC 81 MG PO TBEC
81.0000 mg | DELAYED_RELEASE_TABLET | Freq: Every day | ORAL | Status: DC
  Administered 2016-06-24: 81 mg via ORAL
  Filled 2016-06-23: qty 1

## 2016-06-23 MED ORDER — CYCLOBENZAPRINE HCL 10 MG PO TABS: 10.0000 mg | ORAL_TABLET | Freq: Three times a day (TID) | ORAL | Status: DC | PRN

## 2016-06-23 MED ORDER — ENOXAPARIN SODIUM 40 MG/0.4ML ~~LOC~~ SOLN
40.0000 mg | SUBCUTANEOUS | Status: DC
Start: 2016-06-23 — End: 2016-06-24
  Administered 2016-06-23: 40 mg via SUBCUTANEOUS
  Filled 2016-06-23: qty 0.4

## 2016-06-23 MED ORDER — CALCIUM CARBONATE ANTACID 500 MG PO CHEW
500.0000 mg | CHEWABLE_TABLET | Freq: Every day | ORAL | Status: DC
  Filled 2016-06-23 (×2): qty 3

## 2016-06-23 MED ORDER — ACETAMINOPHEN 650 MG RE SUPP: 650.0000 mg | Freq: Four times a day (QID) | RECTAL | Status: DC | PRN

## 2016-06-23 MED ORDER — VANCOMYCIN 50 MG/ML ORAL SOLUTION
125.0000 mg | Freq: Four times a day (QID) | ORAL | Status: DC
  Administered 2016-06-23 – 2016-06-24 (×2): 125 mg via ORAL
  Filled 2016-06-23 (×3): qty 2.5

## 2016-06-23 NOTE — Telephone Encounter (Signed)
Patient is very weak, she is not able to eat anything. She is still having too many stools to count and her stools are black "About every 10 minutes" .  She is drinking fluids, but is very weak.  She last 10 lbs. Last week. She was able to get the vancomycin started last week on Friday. Please advise

## 2016-06-23 NOTE — H&P (Addendum)
History and Physical    [name]:[id number] DOB: 1935/11/15 DOA: 06/23/2016  Referring MD/NP/PA:   PCP: Cala Bradford, MD   Patient coming from:  The patient is coming from home.  At baseline, pt is independent for most of ADL.  Chief Complaint: diarrhea  HPI: [name] is a 81 y.o. female with medical history significant of hypertension, hyperlipidemia, GERD, SVT, sicca syndrome, Raynaud's syndrome, CAD, CDk-II, who presents with diarrhea.  Pt states that has been having diarrhea for about 2 weeks, she was diagnosed as C. difficile colitis after having a positive C. difficile test on 06/19/16 by GI. She was started on oral vancomycin. She states that she took 4 times of vancomycin yesterday. She stills feels generalized weakness and has poor appetite. He continues to have diarrhea. She had 4 loose stool bowel movement yesterday, but no nausea, vomiting or diarrhea today. She has mild abdominal discomfort and bloating,  but no significant abdominal pain. No fever or chills. She states that has avoided eating because it causes immediate watery stools. Patient denies chest pain, SOB, cough, symptoms of UTI or unilateral weakness.   ED Course: pt was found to have  WBC 17.3, lipase is 16, negative urinalysis, creatinine 1.03, temperature normal, oxygen saturation 92% on room air. Patient is placed on telemetry bed for observation. CT abdomen/pelvis showed inflammatory changes in the right colon which is consistent with C. difficile colitis. ED physician discussed with GI, Dr. Marina Goodell, who recommended to observe patient overnight.   Review of Systems:   General: no fevers, chills, no changes in body weight, has poor appetite, has fatigue HEENT: no blurry vision, hearing changes or sore throat Respiratory: no dyspnea, coughing, wheezing CV: no chest pain, no palpitations GI: no nausea, vomiting, abdominal pain, has diarrhea GU: no dysuria, burning on urination, increased urinary  frequency, hematuria  Ext: no leg edema Neuro: no unilateral weakness, numbness, or tingling, no vision change or hearing loss Skin: no rash, no skin tear. MSK: No muscle spasm, no deformity, no limitation of range of movement in spin Heme: No easy bruising.  Travel history: No recent long distant travel.  Allergy:  Allergies  Allergen Reactions  . Ezetimibe Other (See Comments)    Muscle pain= zetia  . Ezetimibe-Simvastatin Other (See Comments)    Muscle pain= vytorin  . Morphine Other (See Comments)    nightmares  . Rosuvastatin Other (See Comments)    Muscle pain  . Ambien [Zolpidem Tartrate] Other (See Comments)    Fell down  . Plaquenil [Hydroxychloroquine Sulfate] Other (See Comments)    Doesn't recall reaction  . Polytrim [Polymyxin B-Trimethoprim] Other (See Comments)    unknown  . Prevacid [Lansoprazole] Other (See Comments)    dizziness    Past Medical History:  Diagnosis Date  . Abnormal nuclear stress test 05/24/2015  . C. difficile diarrhea on po vanc 10/04/2015  . CAD (coronary artery disease) 2006, 2016   a. 08/2014 NSTEMI: Attempted PCI of 85% RPDA - unable to wire, complicated by dissection, case aborted;  b. 08/2014 Relook Cath: LM nl, LAD 60p, SP1 90ost (small), D1 65ost, RI 45/50, RCA nl, RPDA 90 - healed dissection;  b. 08/2014 Echo: EF 55%, mild/mon midapical inferior HK, mild AI, PASP . c. Cath 05/2015 with CTO of the right-PDA and unsuccessful PCI  . chronic back   . CKD (chronic kidney disease), stage II   . COLONIC POLYPS, ADENOMATOUS, HX OF   . Diverticulitis   . Dyslipidemia  a. statin intolerant.  . Elevated troponin level 10/04/2015  . ENDOMETRIOSIS   . Essential hypertension   . Fibromyalgia   . GASTRIC ANTRAL VASCULAR ECTASIA   . GERD   . History of hiatal hernia   . Hypertension, uncontrolled 10/04/2015  . Insomnia   . IRON DEFICIENCY ANEMIA SECONDARY TO BLOOD LOSS   . Myocardial infarction 08/2014  . Neuropathy (HCC)   .  Osteoarthritis    "back, hips, basically all over" (05/24/2015)  . Raynaud's syndrome   . Rosacea, acne   . Sicca syndrome (HCC)   . SJOGREN'S SYNDROME   . SUPRAVENTRICULAR TACHYCARDIA   . Vitamin D deficiency     Past Surgical History:  Procedure Laterality Date  . APPENDECTOMY    . BREAST BIOPSY Bilateral    "both were negative"  . BREAST LUMPECTOMY Right X 2  . BREAST LUMPECTOMY WITH RADIOACTIVE SEED LOCALIZATION Right 01/24/2014   Procedure: BREAST LUMPECTOMY WITH RADIOACTIVE SEED LOCALIZATION;  Surgeon: Avel Peace, MD;  Location:  SURGERY CENTER;  Service: General;  Laterality: Right;  . CARDIAC CATHETERIZATION N/A 08/18/2014   Procedure: Left Heart Cath and Coronary Angiography;  Surgeon: Marykay Lex, MD;  Location: [facility] INVASIVE CV LAB;  Service: Cardiovascular;  Laterality: N/A;  . CARDIAC CATHETERIZATION  08/18/2014   Procedure: Coronary Balloon Angioplasty;  Surgeon: Marykay Lex, MD;  Location: [facility] INVASIVE CV LAB;  Service: Cardiovascular;;  . CARDIAC CATHETERIZATION N/A 08/21/2014   Procedure: Left Heart Cath and Coronary Angiography;  Surgeon: Marykay Lex, MD;  Location: [facility] INVASIVE CV LAB;  Service: Cardiovascular;  Laterality: N/A;  . CARDIAC CATHETERIZATION  2006  . CARDIAC CATHETERIZATION N/A 05/24/2015   Procedure: Left Heart Cath and Coronary Angiography;  Surgeon: Tonny Bollman, MD;  Location: [facility] INVASIVE CV LAB;  Service: Cardiovascular;  Laterality: N/A;  . COLONOSCOPY W/ BIOPSIES AND POLYPECTOMY  12/17/2006   adenomatous polyps, external hemorrhoids  . ESOPHAGOGASTRODUODENOSCOPY  11/27/2009   APC tretment of lesions  . EYE SURGERY    . HERNIA REPAIR    . HIATAL HERNIA REPAIR  2005   and paraesophageal  . LAPAROSCOPIC CHOLECYSTECTOMY    . TEAR DUCT PROBING Right    "no plugs or anything"  . TOTAL ABDOMINAL HYSTERECTOMY     w/BSO    Social History:  reports that she has never smoked. She has never used smokeless tobacco. She reports that she  drinks alcohol. She reports that she does not use drugs.  Family History:  Family History  Problem Relation Age of Onset  . Heart failure Mother   . Diabetes Mother   . Kidney failure Mother   . Hypertension Mother   . Coronary artery disease Mother   . Heart disease Mother   . Hyperlipidemia Mother   . Peripheral vascular disease Mother     amputation  . Breast cancer Sister     x 3 sisters  . Cancer Sister   . Heart disease Father   . Hypertension Father   . Coronary artery disease Father   . Heart attack Father   . Coronary artery disease Brother   . Heart disease Brother   . Coronary artery disease Brother   . Coronary artery disease Brother   . Breast cancer Sister   . Breast cancer Sister   . Breast cancer Daughter   . Cancer Daughter   . Colon cancer Neg Hx   . Esophageal cancer Neg Hx   . Stomach cancer Neg Hx   .  Rectal cancer Neg Hx      Prior to Admission medications   Medication Sig Start Date End Date Taking? Authorizing Provider  amLODipine (NORVASC) 2.5 MG tablet Take 2.5 mg by mouth daily. 10/04/15  Yes Historical Provider, MD  aspirin 81 MG tablet Take 81 mg by mouth daily.   Yes Historical Provider, MD  Biotin 10 MG TABS Take 10 mg by mouth daily.   Yes Historical Provider, MD  calcium carbonate (ANTACID) 420 MG CHEW chewable tablet Chew 420 mg by mouth daily.   Yes Historical Provider, MD  clopidogrel (PLAVIX) 75 MG tablet Take 1 tablet (75 mg total) by mouth daily with breakfast. 08/21/15  Yes Tonny Bollman, MD  colestipol (COLESTID) 5 g granules Take 5 g by mouth daily with supper. 06/19/16  Yes Iva Boop, MD  cyclobenzaprine (FLEXERIL) 10 MG tablet Take 10 mg by mouth 3 (three) times daily as needed for muscle spasms.   Yes Historical Provider, MD  ferrous sulfate 325 (65 FE) MG tablet Take 325 mg by mouth daily with breakfast.   Yes Historical Provider, MD  fluticasone (FLONASE ALLERGY RELIEF) 50 MCG/ACT nasal spray Place 1 spray into the nose  daily as needed for allergies.   Yes Historical Provider, MD  isosorbide mononitrate (IMDUR) 30 MG 24 hr tablet Take 1 tablet (30 mg total) by mouth daily. 11/30/15  Yes Tonny Bollman, MD  loratadine (CLARITIN) 10 MG tablet Take 10 mg by mouth daily.   Yes Historical Provider, MD  metoprolol succinate (TOPROL-XL) 25 MG 24 hr tablet Take 12.5 mg by mouth daily. 03/29/16  Yes Historical Provider, MD  Multiple Vitamins-Minerals (EMERGEN-C VITAMIN C PO) Take 1 tablet by mouth daily.   Yes Historical Provider, MD  pantoprazole (PROTONIX) 40 MG tablet Take 40 mg by mouth daily. Acid reflux 06/21/15  Yes Historical Provider, MD  Polyvinyl Alcohol-Povidone (REFRESH OP) Place 1 drop into both eyes 2 (two) times daily.    Yes Historical Provider, MD  saccharomyces boulardii (FLORASTOR) 250 MG capsule Take 250 mg by mouth 2 (two) times daily.   Yes Historical Provider, MD  temazepam (RESTORIL) 15 MG capsule Take 15 mg by mouth at bedtime as needed for sleep. SLEEP AID 08/03/13  Yes Historical Provider, MD  traZODone (DESYREL) 50 MG tablet Take half (1/2) to one (1) tablet (25 mg to 50 mg total) by mouth daily at bedtime as needed for sleep. 05/13/16  Yes Historical Provider, MD  vancomycin (VANCOCIN) 125 MG capsule Take 1 capsule (125 mg total) by mouth 4 (four) times daily. For 10 days then take 1 capsule by mouth every 3 days for 10 days 06/20/16  Yes Iva Boop, MD  nitroGLYCERIN (NITROSTAT) 0.4 MG SL tablet Place 1 tablet (0.4 mg total) under the tongue every 5 (five) minutes as needed for chest pain. 06/12/15   Tonny Bollman, MD    Physical Exam: Vitals:   06/23/16 1533 06/23/16 1658 06/23/16 1857 06/23/16 2132  BP: 101/80 110/62 127/69 100/68  Pulse: (!) 110 80 84 81  Resp: 18 18 16 18   Temp:      TempSrc:      SpO2: 97% 97% 92% 95%  Weight:      Height:       General: Not in acute distress. Dry mucus and membrane HEENT:       Eyes: PERRL, EOMI, no scleral icterus.       ENT: No discharge from  the ears and nose, no pharynx injection, no  tonsillar enlargement.        Neck: No JVD, no bruit, no mass felt. Heme: No neck lymph node enlargement. Cardiac: S1/S2, RRR, No murmurs, No gallops or rubs. Respiratory: No rales, wheezing, rhonchi or rubs. GI: Soft, nondistended, nontender, no rebound pain, no organomegaly, BS present. GU: No hematuria Ext: No pitting leg edema bilaterally. 2+DP/PT pulse bilaterally. Musculoskeletal: No joint deformities, No joint redness or warmth, no limitation of ROM in spin. Skin: No rashes.  Neuro: Alert, oriented X3, cranial nerves II-XII grossly intact, moves all extremities normally. Psych: Patient is not psychotic, no suicidal or hemocidal ideation.  Labs on Admission: I have personally reviewed following labs and imaging studies  CBC:  Recent Labs Lab 06/23/16 1320  WBC 17.3*  HGB 13.2  HCT 39.5  MCV 79.3  PLT 264   Basic Metabolic Panel:  Recent Labs Lab 06/23/16 1320  NA 132*  K 4.1  CL 100*  CO2 22  GLUCOSE 110*  BUN 13  CREATININE 1.03*  CALCIUM 9.5   GFR: Estimated Creatinine Clearance: 35.6 mL/min (A) (by C-G formula based on SCr of 1.03 mg/dL (H)). Liver Function Tests:  Recent Labs Lab 06/23/16 1320  AST 22  ALT 12*  ALKPHOS 71  BILITOT 0.8  PROT 6.4*  ALBUMIN 3.4*    Recent Labs Lab 06/23/16 1320  LIPASE 16   No results for input(s): AMMONIA in the last 168 hours. Coagulation Profile: No results for input(s): INR, PROTIME in the last 168 hours. Cardiac Enzymes: No results for input(s): CKTOTAL, CKMB, CKMBINDEX, TROPONINI in the last 168 hours. BNP (last 3 results) No results for input(s): PROBNP in the last 8760 hours. HbA1C: No results for input(s): HGBA1C in the last 72 hours. CBG: No results for input(s): GLUCAP in the last 168 hours. Lipid Profile: No results for input(s): CHOL, HDL, LDLCALC, TRIG, CHOLHDL, LDLDIRECT in the last 72 hours. Thyroid Function Tests: No results for input(s): TSH,  T4TOTAL, FREET4, T3FREE, THYROIDAB in the last 72 hours. Anemia Panel: No results for input(s): VITAMINB12, FOLATE, FERRITIN, TIBC, IRON, RETICCTPCT in the last 72 hours. Urine analysis:    Component Value Date/Time   COLORURINE YELLOW 06/23/2016 1310   APPEARANCEUR CLEAR 06/23/2016 1310   LABSPEC 1.003 (L) 06/23/2016 1310   PHURINE 6.0 06/23/2016 1310   GLUCOSEU NEGATIVE 06/23/2016 1310   HGBUR NEGATIVE 06/23/2016 1310   BILIRUBINUR NEGATIVE 06/23/2016 1310   KETONESUR NEGATIVE 06/23/2016 1310   PROTEINUR NEGATIVE 06/23/2016 1310   UROBILINOGEN 0.2 03/15/2009 1800   NITRITE NEGATIVE 06/23/2016 1310   LEUKOCYTESUR NEGATIVE 06/23/2016 1310   Sepsis Labs: @LABRCNTIP (procalcitonin:4,lacticidven:4) ) Recent Results (from the past 240 hour(s))  Clostridium Difficile by PCR     Status: Abnormal   Collection Time: 06/19/16 12:42 PM  Result Value Ref Range Status   Toxigenic C Difficile by pcr Detected (AA) Not Detected Final    Comment: This test is for use only with liquid or soft stools; performance characteristics of other clinical specimen types have not been established.   This assay was performed by Cepheid GeneXpert(R) PCR. The performance characteristics of this assay have been determined by [employer]. Performance characteristics refer to the analytical performance of the test.      Radiological Exams on Admission: Ct Abdomen Pelvis W Contrast  Result Date: 06/23/2016 CLINICAL DATA:  Weakness and recent diagnosis of C. difficile infection EXAM: CT ABDOMEN AND PELVIS WITH CONTRAST TECHNIQUE: Multidetector CT imaging of the abdomen and pelvis was performed using the standard protocol  following bolus administration of intravenous contrast. CONTRAST:  ISOVUE-300 IOPAMIDOL (ISOVUE-300) INJECTION 61% COMPARISON:  02/20/2006 FINDINGS: Lower chest: No acute abnormality. Hepatobiliary: No focal liver abnormality is seen. Status post cholecystectomy. No biliary  dilatation. Pancreas: Unremarkable. No pancreatic ductal dilatation or surrounding inflammatory changes. Spleen: Normal in size without focal abnormality. Adrenals/Urinary Tract: Adrenal glands are within normal limits. A small right renal cyst is seen. No calculi or obstructive changes are noted. The bladder is well distended. Stomach/Bowel: The colon is predominately decompressed with enhancing mucosa consistent with inflammatory changes well as some pericolonic inflammatory change. No obstructive changes are seen. These findings are consistent with the given clinical history. The appendix has been surgically removed. Vascular/Lymphatic: Aortic atherosclerosis. No enlarged abdominal or pelvic lymph nodes. Reproductive: Status post hysterectomy. No adnexal masses. Other: No abdominal wall hernia or abnormality. No abdominopelvic ascites. Musculoskeletal: L1 and L3 compression deformities are noted with overall chronic appearance. Diffuse osteopenia is noted. IMPRESSION: Inflammatory changes in the colon predominately in the right colon consistent with the given clinical history of C. difficile infection. L1 and L3 compression deformities likely of a chronic nature given their appearance. No other focal abnormality is noted. Electronically Signed   By: Alcide Clever M.D.   On: 06/23/2016 17:35     EKG:  Not done in ED, will get one.   Assessment/Plan Principal Problem:   C. difficile colitis Active Problems:   CAD S/P unsuccessful PCI 08/18/14   GERD   Essential hypertension   Dehydration   Sepsis (HCC)   C. difficile colitis and dehydration: Patient has leukocytosis, but no fever. Clinically nonseptic. Patient is dehydrated on examination. Currently hemodynamically stable. ED physician discussed with GI, Dr. Marina Goodell recommended to observe patient overnight.  - will place on tele bed for obs - prn Zofran for nausea  - Blood culture x 2 - continue oral vancomycine - will check lactic acid level -  IVF: 2. L of NS bolus in ED, followed by 75 cc/h - may consult to GI if getting worse  Addendum: Sepsis-pt' lactic acid level is elevated at 3.0. Now she meets criteria for sepsis with leukocytosis, tachycardia and elevated lactic acid. Hemodynamically stable.  -will get Procalcitonin and trend lactic acid levels per sepsis protocol. -IVF: total of 3L of NS bolus, followed by 75cc/h    CAD S/P unsuccessful PCI 08/18/14: No CP -Continue aspirin, Plavix, Imdur  and metoprolol -When necessary nitroglycerin  GERD: -will switch Protonix to pepcid  HTN: -Amlodipine,   DVT ppx: SQ Lovenox Code Status: Full code Family Communication:  Yes, patient's daughter at bed side Disposition Plan:  Anticipate discharge back to previous home environment Consults called: EDP discussed with GI, Dr. Marina Goodell  Admission status: Obs / tele     Date of Service 06/24/2016    Lorretta Harp [facility] Pager [phone]  If 7PM-7AM, please contact night-coverage [email or web address] Password [email or web address] 06/24/2016, 1:52 AM

## 2016-06-23 NOTE — ED Notes (Signed)
Pt provided with coke to fluid challenge.

## 2016-06-23 NOTE — ED Notes (Signed)
Hospitalist at bedside 

## 2016-06-23 NOTE — Telephone Encounter (Signed)
ER PAC called regarding patient with C. diiff colitis, dehydration and failure to thrive. Just started on Vanc. Having trouble keeping down PO's.  Patient spoke to office earlier today (chart reviewed). ER states patient wants to go home as she was feeling better after IV hydration. Wanted me to weigh in... Given her age, comorbidties, and leukocytosis, I recommended at last admit for overnight observation. GI could see tomorrow if needed. They agreed. Note forwarded to Dr. Leone Payor

## 2016-06-23 NOTE — Telephone Encounter (Signed)
Patient notified of instructions.  She verbalized understanding and will go ASAP.

## 2016-06-23 NOTE — Progress Notes (Signed)
PHARMACIST - PHYSICIAN ORDER COMMUNICATION  CONCERNING: P&T Medication Policy on Herbal Medications  DESCRIPTION:  This patient's order for: Biotin / Emergen-C has been noted.  This product(s) is classified as an "herbal" or natural product. Due to a lack of definitive safety studies or FDA approval, nonstandard manufacturing practices, plus the potential risk of unknown drug-drug interactions while on inpatient medications, the Pharmacy and Therapeutics Committee does not permit the use of "herbal" or natural products of this type within [facility].   ACTION TAKEN: The pharmacy department is unable to verify this order at this time and your patient has been informed of this safety policy. Please reevaluate patient's clinical condition at discharge and address if the herbal or natural product(s) should be resumed at that time.

## 2016-06-23 NOTE — ED Notes (Signed)
PA at bedside.

## 2016-06-23 NOTE — ED Triage Notes (Signed)
Pt reports recently diagnosed with c-diff from GI dr and has not been able to eat for past 9 days. Pt states trying to drink water but not able to force herself to drink it.

## 2016-06-23 NOTE — ED Provider Notes (Signed)
[facility]-EMERGENCY DEPT Provider Note   CSN: [id number] Arrival date & time: 06/23/16  1253     History   Chief Complaint Chief Complaint  Patient presents with  . Diarrhea    HPI [name] is a 81 y.o. female.  HPI   Pt with hx HTN, CAD, CKD, diverticulitis p/w 2 weeks anorexia, 12 pound weight loss in 1 week, profuse diarrhea, generalized weakness.  Diagnosed with c. Diff by GI MD Dr Leone Payor, started on vancomycin yesterday.  Reports abdominal bloating without significant pain.  Is forcing herself to drink.  Has no appetite and has avoided eating because it causes immediate watery stools.   Denies fevers, vomiting.  Urine is dark, no dysuria.  Sent to ED by GI doctor.    Past Medical History:  Diagnosis Date  . Abnormal nuclear stress test 05/24/2015  . C. difficile diarrhea on po vanc 10/04/2015  . CAD (coronary artery disease) 2006, 2016   a. 08/2014 NSTEMI: Attempted PCI of 85% RPDA - unable to wire, complicated by dissection, case aborted;  b. 08/2014 Relook Cath: LM nl, LAD 60p, SP1 90ost (small), D1 65ost, RI 45/50, RCA nl, RPDA 90 - healed dissection;  b. 08/2014 Echo: EF 55%, mild/mon midapical inferior HK, mild AI, PASP . c. Cath 05/2015 with CTO of the right-PDA and unsuccessful PCI  . chronic back   . CKD (chronic kidney disease), stage II   . COLONIC POLYPS, ADENOMATOUS, HX OF   . Diverticulitis   . Dyslipidemia    a. statin intolerant.  . Elevated troponin level 10/04/2015  . ENDOMETRIOSIS   . Essential hypertension   . Fibromyalgia   . GASTRIC ANTRAL VASCULAR ECTASIA   . GERD   . History of hiatal hernia   . Hypertension, uncontrolled 10/04/2015  . Insomnia   . IRON DEFICIENCY ANEMIA SECONDARY TO BLOOD LOSS   . Myocardial infarction 08/2014  . Neuropathy (HCC)   . Osteoarthritis    "back, hips, basically all over" (05/24/2015)  . Raynaud's syndrome   . Rosacea, acne   . Sicca syndrome (HCC)   . SJOGREN'S SYNDROME   . SUPRAVENTRICULAR TACHYCARDIA   .  Vitamin D deficiency     Patient Active Problem List   Diagnosis Date Noted  . C. difficile colitis 06/23/2016  . Dehydration 06/23/2016  . Hypertension, uncontrolled 10/04/2015  . Elevated troponin level 10/04/2015  . C. difficile diarrhea on po vanc 10/04/2015  . Chest pain at rest 10/03/2015  . Chest pain with high risk for cardiac etiology 06/13/2015  . Unstable angina (HCC) 06/13/2015  . Abnormal nuclear stress test 05/24/2015  . Essential hypertension   . CAD (coronary artery disease)   . NSTEMI (non-ST elevated myocardial infarction) (HCC) 08/18/2014  . Paresthesia 08/01/2014  . Atypical ductal hyperplasia of right breast 02/22/2014  . Dizziness and giddiness 05/24/2013  . Palpitations 03/06/2011  . SJOGREN'S SYNDROME 04/16/2009  . SUPRAVENTRICULAR TACHYCARDIA 06/24/2008  . Dyslipidemia-statin and Zetia intol 04/20/2007  . Anemia 04/20/2007  . CAD S/P unsuccessful PCI 08/18/14 04/20/2007  . Right bundle branch block 04/20/2007  . Raynaud's syndrome 04/20/2007  . GERD 04/20/2007  . GASTRIC ANTRAL VASCULAR ECTASIA 04/20/2007  . ENDOMETRIOSIS 04/20/2007  . ACNE ROSACEA 04/20/2007  . OSTEOARTHRITIS 04/20/2007  . COLONIC POLYPS, ADENOMATOUS, HX OF 04/20/2007  . HEADACHE, CHRONIC, HX OF 04/20/2007    Past Surgical History:  Procedure Laterality Date  . APPENDECTOMY    . BREAST BIOPSY Bilateral    "both were negative"  .  BREAST LUMPECTOMY Right X 2  . BREAST LUMPECTOMY WITH RADIOACTIVE SEED LOCALIZATION Right 01/24/2014   Procedure: BREAST LUMPECTOMY WITH RADIOACTIVE SEED LOCALIZATION;  Surgeon: Avel Peace, MD;  Location: [facility];  Service: General;  Laterality: Right;  . CARDIAC CATHETERIZATION N/A 08/18/2014   Procedure: Left Heart Cath and Coronary Angiography;  Surgeon: Marykay Lex, MD;  Location: [facility] INVASIVE CV LAB;  Service: Cardiovascular;  Laterality: N/A;  . CARDIAC CATHETERIZATION  08/18/2014   Procedure: Coronary Balloon Angioplasty;   Surgeon: Marykay Lex, MD;  Location: [facility] INVASIVE CV LAB;  Service: Cardiovascular;;  . CARDIAC CATHETERIZATION N/A 08/21/2014   Procedure: Left Heart Cath and Coronary Angiography;  Surgeon: Marykay Lex, MD;  Location: [facility] INVASIVE CV LAB;  Service: Cardiovascular;  Laterality: N/A;  . CARDIAC CATHETERIZATION  2006  . CARDIAC CATHETERIZATION N/A 05/24/2015   Procedure: Left Heart Cath and Coronary Angiography;  Surgeon: Tonny Bollman, MD;  Location: [facility] INVASIVE CV LAB;  Service: Cardiovascular;  Laterality: N/A;  . COLONOSCOPY W/ BIOPSIES AND POLYPECTOMY  12/17/2006   adenomatous polyps, external hemorrhoids  . ESOPHAGOGASTRODUODENOSCOPY  11/27/2009   APC tretment of lesions  . EYE SURGERY    . HERNIA REPAIR    . HIATAL HERNIA REPAIR  2005   and paraesophageal  . LAPAROSCOPIC CHOLECYSTECTOMY    . TEAR DUCT PROBING Right    "no plugs or anything"  . TOTAL ABDOMINAL HYSTERECTOMY     w/BSO    OB History    No data available       Home Medications    Prior to Admission medications   Medication Sig Start Date End Date Taking? Authorizing Provider  amLODipine (NORVASC) 2.5 MG tablet Take 2.5 mg by mouth daily. 10/04/15  Yes Historical Provider, MD  aspirin 81 MG tablet Take 81 mg by mouth daily.   Yes Historical Provider, MD  Biotin 10 MG TABS Take 10 mg by mouth daily.   Yes Historical Provider, MD  calcium carbonate (ANTACID) 420 MG CHEW chewable tablet Chew 420 mg by mouth daily.   Yes Historical Provider, MD  clopidogrel (PLAVIX) 75 MG tablet Take 1 tablet (75 mg total) by mouth daily with breakfast. 08/21/15  Yes Tonny Bollman, MD  colestipol (COLESTID) 5 g granules Take 5 g by mouth daily with supper. 06/19/16  Yes Iva Boop, MD  cyclobenzaprine (FLEXERIL) 10 MG tablet Take 10 mg by mouth 3 (three) times daily as needed for muscle spasms.   Yes Historical Provider, MD  ferrous sulfate 325 (65 FE) MG tablet Take 325 mg by mouth daily with breakfast.   Yes Historical Provider,  MD  fluticasone (FLONASE ALLERGY RELIEF) 50 MCG/ACT nasal spray Place 1 spray into the nose daily as needed for allergies.   Yes Historical Provider, MD  isosorbide mononitrate (IMDUR) 30 MG 24 hr tablet Take 1 tablet (30 mg total) by mouth daily. 11/30/15  Yes Tonny Bollman, MD  loratadine (CLARITIN) 10 MG tablet Take 10 mg by mouth daily.   Yes Historical Provider, MD  metoprolol succinate (TOPROL-XL) 25 MG 24 hr tablet Take 12.5 mg by mouth daily. 03/29/16  Yes Historical Provider, MD  Multiple Vitamins-Minerals (EMERGEN-C VITAMIN C PO) Take 1 tablet by mouth daily.   Yes Historical Provider, MD  pantoprazole (PROTONIX) 40 MG tablet Take 40 mg by mouth daily. Acid reflux 06/21/15  Yes Historical Provider, MD  Polyvinyl Alcohol-Povidone (REFRESH OP) Place 1 drop into both eyes 2 (two) times daily.  Yes Historical Provider, MD  saccharomyces boulardii (FLORASTOR) 250 MG capsule Take 250 mg by mouth 2 (two) times daily.   Yes Historical Provider, MD  temazepam (RESTORIL) 15 MG capsule Take 15 mg by mouth at bedtime as needed for sleep. SLEEP AID 08/03/13  Yes Historical Provider, MD  traZODone (DESYREL) 50 MG tablet Take half (1/2) to one (1) tablet (25 mg to 50 mg total) by mouth daily at bedtime as needed for sleep. 05/13/16  Yes Historical Provider, MD  vancomycin (VANCOCIN) 125 MG capsule Take 1 capsule (125 mg total) by mouth 4 (four) times daily. For 10 days then take 1 capsule by mouth every 3 days for 10 days 06/20/16  Yes Iva Boop, MD  nitroGLYCERIN (NITROSTAT) 0.4 MG SL tablet Place 1 tablet (0.4 mg total) under the tongue every 5 (five) minutes as needed for chest pain. 06/12/15   Tonny Bollman, MD    Family History Family History  Problem Relation Age of Onset  . Heart failure Mother   . Diabetes Mother   . Kidney failure Mother   . Hypertension Mother   . Coronary artery disease Mother   . Heart disease Mother   . Hyperlipidemia Mother   . Peripheral vascular disease Mother      amputation  . Breast cancer Sister     x 3 sisters  . Cancer Sister   . Heart disease Father   . Hypertension Father   . Coronary artery disease Father   . Heart attack Father   . Coronary artery disease Brother   . Heart disease Brother   . Coronary artery disease Brother   . Coronary artery disease Brother   . Breast cancer Sister   . Breast cancer Sister   . Breast cancer Daughter   . Cancer Daughter   . Colon cancer Neg Hx   . Esophageal cancer Neg Hx   . Stomach cancer Neg Hx   . Rectal cancer Neg Hx     Social History Social History  Substance Use Topics  . Smoking status: Never Smoker  . Smokeless tobacco: Never Used  . Alcohol use Yes     Allergies   Ezetimibe; Ezetimibe-simvastatin; Morphine; Rosuvastatin; Ambien [zolpidem tartrate]; Plaquenil [hydroxychloroquine sulfate]; Polytrim [polymyxin b-trimethoprim]; and Prevacid [lansoprazole]   Review of Systems Review of Systems  All other systems reviewed and are negative.    Physical Exam Updated Vital Signs BP 100/68 (BP Location: Left Arm)   Pulse 81   Temp 98.1 F (36.7 C) (Oral)   Resp 18   Ht 5\' 2"  (1.575 m)   Wt 54.4 kg   SpO2 95%   BMI 21.95 kg/m   Physical Exam  Constitutional: She appears well-developed and well-nourished. No distress.  HENT:  Head: Normocephalic and atraumatic.  Neck: Neck supple.  Cardiovascular: Normal rate and regular rhythm.   Pulmonary/Chest: Effort normal and breath sounds normal. No respiratory distress. She has no wheezes. She has no rales.  Abdominal: Soft. She exhibits no distension and no mass. There is tenderness (mild, diffuse). There is no rebound and no guarding.  Neurological: She is alert.  Pt confused about which day of the week it is, how many days she has been sick.    Skin: She is not diaphoretic.  Nursing note and vitals reviewed.    ED Treatments / Results  Labs (all labs ordered are listed, but only abnormal results are displayed) Labs  Reviewed  COMPREHENSIVE METABOLIC PANEL - Abnormal; Notable for the  following:       Result Value   Sodium 132 (*)    Chloride 100 (*)    Glucose, Bld 110 (*)    Creatinine, Ser 1.03 (*)    Total Protein 6.4 (*)    Albumin 3.4 (*)    ALT 12 (*)    GFR calc non Af Amer 51 (*)    GFR calc Af Amer 59 (*)    All other components within normal limits  CBC - Abnormal; Notable for the following:    WBC 17.3 (*)    All other components within normal limits  URINALYSIS, ROUTINE W REFLEX MICROSCOPIC - Abnormal; Notable for the following:    Specific Gravity, Urine 1.003 (*)    All other components within normal limits  LIPASE, BLOOD  LACTIC ACID, PLASMA  BASIC METABOLIC PANEL  CBC    EKG  EKG Interpretation None       Radiology Ct Abdomen Pelvis W Contrast  Result Date: 06/23/2016 CLINICAL DATA:  Weakness and recent diagnosis of C. difficile infection EXAM: CT ABDOMEN AND PELVIS WITH CONTRAST TECHNIQUE: Multidetector CT imaging of the abdomen and pelvis was performed using the standard protocol following bolus administration of intravenous contrast. CONTRAST:  ISOVUE-300 IOPAMIDOL (ISOVUE-300) INJECTION 61% COMPARISON:  02/20/2006 FINDINGS: Lower chest: No acute abnormality. Hepatobiliary: No focal liver abnormality is seen. Status post cholecystectomy. No biliary dilatation. Pancreas: Unremarkable. No pancreatic ductal dilatation or surrounding inflammatory changes. Spleen: Normal in size without focal abnormality. Adrenals/Urinary Tract: Adrenal glands are within normal limits. A small right renal cyst is seen. No calculi or obstructive changes are noted. The bladder is well distended. Stomach/Bowel: The colon is predominately decompressed with enhancing mucosa consistent with inflammatory changes well as some pericolonic inflammatory change. No obstructive changes are seen. These findings are consistent with the given clinical history. The appendix has been surgically removed.  Vascular/Lymphatic: Aortic atherosclerosis. No enlarged abdominal or pelvic lymph nodes. Reproductive: Status post hysterectomy. No adnexal masses. Other: No abdominal wall hernia or abnormality. No abdominopelvic ascites. Musculoskeletal: L1 and L3 compression deformities are noted with overall chronic appearance. Diffuse osteopenia is noted. IMPRESSION: Inflammatory changes in the colon predominately in the right colon consistent with the given clinical history of C. difficile infection. L1 and L3 compression deformities likely of a chronic nature given their appearance. No other focal abnormality is noted. Electronically Signed   By: Alcide Clever M.D.   On: 06/23/2016 17:35    Procedures Procedures (including critical care time)  Medications Ordered in ED Medications  vancomycin (VANCOCIN) 125 MG capsule 125 mg (not administered)  amLODipine (NORVASC) tablet 2.5 mg (not administered)  fluticasone (FLONASE) 50 MCG/ACT nasal spray 1 spray (not administered)  metoprolol succinate (TOPROL-XL) 24 hr tablet 12.5 mg (not administered)  traZODone (DESYREL) tablet 50 mg (not administered)  loratadine (CLARITIN) tablet 10 mg (not administered)  saccharomyces boulardii (FLORASTOR) capsule 250 mg (not administered)  isosorbide mononitrate (IMDUR) 24 hr tablet 30 mg (not administered)  clopidogrel (PLAVIX) tablet 75 mg (not administered)  nitroGLYCERIN (NITROSTAT) SL tablet 0.4 mg (not administered)  Biotin TABS 10 mg (not administered)  calcium carbonate (TITRALAC) chewable tablet 420 mg (not administered)  cyclobenzaprine (FLEXERIL) tablet 10 mg (not administered)  EMERGEN-C VITAMIN C PACK 1 tablet (not administered)  carboxymethylcellulose 1 % ophthalmic solution 1 drop (not administered)  aspirin tablet 81 mg (not administered)  temazepam (RESTORIL) capsule 15 mg (not administered)  ferrous sulfate tablet 325 mg (not administered)  0.9 %  sodium  chloride infusion (not administered)  ondansetron  (ZOFRAN) injection 4 mg (not administered)  enoxaparin (LOVENOX) injection 40 mg (not administered)  sodium chloride flush (NS) 0.9 % injection 3 mL (not administered)  acetaminophen (TYLENOL) tablet 650 mg (not administered)    Or  acetaminophen (TYLENOL) suppository 650 mg (not administered)  famotidine (PEPCID) tablet 20 mg (not administered)  colestipol (COLESTID) packet 5 g (not administered)  sodium chloride 0.9 % bolus 1,000 mL (0 mLs Intravenous Stopped 06/23/16 1639)  iopamidol (ISOVUE-300) 61 % injection (100 mLs Intravenous Contrast Given 06/23/16 1710)  sodium chloride 0.9 % bolus 1,000 mL (1,000 mLs Intravenous New Bag/Given 06/23/16 1743)     Initial Impression / Assessment and Plan / ED Course  I have reviewed the triage vital signs and the nursing notes.  Pertinent labs & imaging results that were available during my care of the patient were reviewed by me and considered in my medical decision making (see chart for details).  Clinical Course as of Jun 24 2206  Mon Jun 23, 2016  2034 I spoke with Dr Marina Goodell, GI, who agrees that patient should be admitted, at least for observation, hydration and continued therapy.  Will discuss with patient.  (Pt previously offered admission and she was adamant about going home)  [EW]    Clinical Course User Index [EW] Trixie Dredge, PA-C    Pt with approximately 10 days of anorexia, profuse diarrhea, weight loss with worsening generalized weakness, slight confusion.  Pt diagnosed with c.diff by GI Dr Leone Payor and started on vancomycin PO yesterday.  She did not take her meds today, was advised to come to ED.  Labs significant for dehydration.  Pt also seen by Dr Erma Heritage.  Discussed with GI.  Pt did agree to admission overnight for IVF hydration and continued treatment for c.diff.  Dr Clyde Lundborg, [facility], accepting.    Final Clinical Impressions(s) / ED Diagnoses   Final diagnoses:  C. difficile colitis    New Prescriptions Current Discharge  Medication List       Trixie Dredge, PA-C 06/23/16 2211    Trixie Dredge, PA-C 06/23/16 2212    Shaune Pollack, MD 06/25/16 1335

## 2016-06-23 NOTE — ED Notes (Signed)
ED Provider at bedside. 

## 2016-06-23 NOTE — Telephone Encounter (Signed)
She should be seen in the ED and evaluated and treated - may need admission

## 2016-06-24 DIAGNOSIS — K219 Gastro-esophageal reflux disease without esophagitis: Secondary | ICD-10-CM

## 2016-06-24 DIAGNOSIS — A0472 Enterocolitis due to Clostridium difficile, not specified as recurrent: Secondary | ICD-10-CM | POA: Diagnosis not present

## 2016-06-24 DIAGNOSIS — A419 Sepsis, unspecified organism: Secondary | ICD-10-CM | POA: Diagnosis not present

## 2016-06-24 DIAGNOSIS — I10 Essential (primary) hypertension: Secondary | ICD-10-CM | POA: Diagnosis not present

## 2016-06-24 DIAGNOSIS — Z9861 Coronary angioplasty status: Secondary | ICD-10-CM | POA: Diagnosis not present

## 2016-06-24 DIAGNOSIS — I2510 Atherosclerotic heart disease of native coronary artery without angina pectoris: Secondary | ICD-10-CM | POA: Diagnosis not present

## 2016-06-24 DIAGNOSIS — E860 Dehydration: Secondary | ICD-10-CM | POA: Diagnosis not present

## 2016-06-24 LAB — CBC
HEMATOCRIT: 32.9 % — AB (ref 36.0–46.0)
Hemoglobin: 10.7 g/dL — ABNORMAL LOW (ref 12.0–15.0)
MCH: 25.8 pg — ABNORMAL LOW (ref 26.0–34.0)
MCHC: 32.5 g/dL (ref 30.0–36.0)
MCV: 79.3 fL (ref 78.0–100.0)
PLATELETS: 235 10*3/uL (ref 150–400)
RBC: 4.15 MIL/uL (ref 3.87–5.11)
RDW: 14.7 % (ref 11.5–15.5)
WBC: 14.2 10*3/uL — ABNORMAL HIGH (ref 4.0–10.5)

## 2016-06-24 LAB — BASIC METABOLIC PANEL
Anion gap: 6 (ref 5–15)
BUN: 8 mg/dL (ref 6–20)
CHLORIDE: 107 mmol/L (ref 101–111)
CO2: 20 mmol/L — ABNORMAL LOW (ref 22–32)
CREATININE: 0.71 mg/dL (ref 0.44–1.00)
Calcium: 8.4 mg/dL — ABNORMAL LOW (ref 8.9–10.3)
GFR calc Af Amer: >60 mL/min (ref >60)
GLUCOSE: 85 mg/dL (ref 65–99)
Potassium: 3.8 mmol/L (ref 3.5–5.1)
SODIUM: 133 mmol/L — AB (ref 135–145)

## 2016-06-24 LAB — PROCALCITONIN: Procalcitonin: 0.53 ng/mL

## 2016-06-24 LAB — LACTIC ACID, PLASMA
LACTIC ACID, VENOUS: 1 mmol/L (ref 0.5–1.9)
Lactic Acid, Venous: 2.1 mmol/L (ref 0.5–1.9)

## 2016-06-24 MED ORDER — FAMOTIDINE 20 MG PO TABS: 20.0000 mg | ORAL_TABLET | Freq: Every day | ORAL | 1 refills | Status: DC

## 2016-06-24 MED ORDER — CHLORHEXIDINE GLUCONATE 0.12 % MT SOLN
15.0000 mL | Freq: Two times a day (BID) | OROMUCOSAL | Status: DC
  Administered 2016-06-24 (×2): 15 mL via OROMUCOSAL
  Filled 2016-06-24: qty 15

## 2016-06-24 MED ORDER — ORAL CARE MOUTH RINSE: 15.0000 mL | Freq: Two times a day (BID) | OROMUCOSAL | Status: DC

## 2016-06-24 MED ORDER — SODIUM CHLORIDE 0.9 % IV BOLUS (SEPSIS)
500.0000 mL | Freq: Once | INTRAVENOUS | Status: DC
Start: 2016-06-24 — End: 2016-06-24

## 2016-06-24 MED ORDER — ENSURE ENLIVE PO LIQD: 237.0000 mL | Freq: Two times a day (BID) | ORAL | Status: DC

## 2016-06-24 NOTE — Progress Notes (Signed)
CRITICAL VALUE ALERT  Critical value received:  Lactic Acid 3.0  Date of notification:  06/23/16  Time of notification:  2306  Critical value read back:Yes.    Nurse who received alert:  Vella Redhead  MD notified (1st page):  Tama Gander  Time of first page:  2309  MD notified (2nd page):  Time of second page:  Responding MD:  Tama Gander  Time MD responded:  [phone]

## 2016-06-24 NOTE — Discharge Summary (Signed)
Physician Discharge Summary  [name] ZOX:[id number] DOB: Feb 08, 1936 DOA: 06/23/2016  PCP: Cala Bradford, MD  Admit date: 06/23/2016 Discharge date: 06/24/2016  Time spent: 35 minutes  Recommendations for Outpatient Follow-up:  1. Repeat BMET to follow electrolytes and renal function. 2. Repeat CBC to follow WBC's trend 3. Reassess resolution of diarrhea and completion of therapy for C. Diff.   Discharge Diagnoses:  Principal Problem:   C. difficile colitis Active Problems:   CAD S/P unsuccessful PCI 08/18/14   GERD   Essential hypertension   Dehydration   Sepsis (HCC)   Discharge Condition: stable and improved. Discharge home with instruction to follow up with PCP in 10 days. Patient will also follow up with GI service as previously instructed.  Diet recommendation: heart healthy diet.  Filed Weights   06/23/16 1303 06/23/16 2228  Weight: 54.4 kg (120 lb) 54.4 kg (119 lb 14.9 oz)    History of present illness:  As per Dr. Clyde Lundborg H&P written on 06/23/16 81 y.o. female with medical history significant of hypertension, hyperlipidemia, GERD, SVT, sicca syndrome, Raynaud's syndrome, CAD, CDk-II, who presented to ER with diarrhea.  Pt states that she has been having diarrhea for about 2 weeks, she was diagnosed as C. difficile colitis after having a positive C. difficile test on 06/19/16 by GI. She was started on oral vancomycin and stated starting treatment with on 06/21/16. She was still feeling generally weak and had poor appetite. She continued experiencing diarrhea. and had 4 loose stool bowel movement day prior to admission, but no nausea or vomiting reported. She has had mild abdominal discomfort and felt bloated. No fever or chills. She avoided eating/normal PO intake because it causes immediate watery stools. Patient denies chest pain, SOB, cough, symptoms of UTI or Focal weakness.   Hospital Course:  1-Sepsis: due to C. Diff -sepsis features resolved -lactic acid WNL and WBC's  trending down -HR in 80's-90's after fluid resuscitation -Patient with no nausea, no vomiting and understood the need for adequate hydration and PO intake -will continue Vancomycin as prescribed by GI.  2-CAD S/P unsuccessful PCI 08/18/14: No CP -Continue aspirin, Plavix, Imdur  and metoprolol -continue RPN nitroglycerin  3-GERD -PPI discontinue given C. Diff -started on pepcid  4-HTN: -Will continue Imdur and metoprolol -BP is stable and well controlled -continue heart healthy diet   5-Dehydration -corrected with IVF's resuscitation -encouraged to maintain adequate hydration   Procedures: See below for x-ray reports   Consultations: None   Discharge Exam: Vitals:   06/23/16 2132 06/24/16 0649  BP: 100/68 125/66  Pulse: 81 90  Resp: 18 20  Temp:  99.4 F (37.4 C)    General: afebrile, denying CP and SOB. Patient looking to go home. Reported feeling better, not having N/V and denying major abd discomfort. Still with diarrhea. Cardiovascular: S1 and S2, no rubs or gallops Respiratory: CTA bilaterally Abdomen: soft, NT, ND, positive BS Extremities: no edema or cyanosis   Discharge Instructions   Discharge Instructions    Diet - low sodium heart healthy    Complete by:  As directed    Discharge instructions    Complete by:  As directed    Keep yourself well hydrated Take medications as prescribed  Follow up with PCP in 10 days Follow up with Dr. Leone Payor as previously instructed     Current Discharge Medication List    START taking these medications   Details  famotidine (PEPCID) 20 MG tablet Take 1 tablet (20 mg total)  by mouth daily. Qty: 30 tablet, Refills: 1    feeding supplement, ENSURE ENLIVE, (ENSURE ENLIVE) LIQD Take 237 mLs by mouth 2 (two) times daily between meals.      CONTINUE these medications which have NOT CHANGED   Details  amLODipine (NORVASC) 2.5 MG tablet Take 2.5 mg by mouth daily.    aspirin 81 MG tablet Take 81 mg by mouth  daily.    Biotin 10 MG TABS Take 10 mg by mouth daily.    calcium carbonate (ANTACID) 420 MG CHEW chewable tablet Chew 420 mg by mouth daily.    clopidogrel (PLAVIX) 75 MG tablet Take 1 tablet (75 mg total) by mouth daily with breakfast. Qty: 90 tablet, Refills: 3    colestipol (COLESTID) 5 g granules Take 5 g by mouth daily with supper. Qty: 500 g, Refills: 3    cyclobenzaprine (FLEXERIL) 10 MG tablet Take 10 mg by mouth 3 (three) times daily as needed for muscle spasms.    ferrous sulfate 325 (65 FE) MG tablet Take 325 mg by mouth daily with breakfast.    fluticasone (FLONASE ALLERGY RELIEF) 50 MCG/ACT nasal spray Place 1 spray into the nose daily as needed for allergies.    isosorbide mononitrate (IMDUR) 30 MG 24 hr tablet Take 1 tablet (30 mg total) by mouth daily. Qty: 90 tablet, Refills: 3    loratadine (CLARITIN) 10 MG tablet Take 10 mg by mouth daily.    metoprolol succinate (TOPROL-XL) 25 MG 24 hr tablet Take 12.5 mg by mouth daily.    Multiple Vitamins-Minerals (EMERGEN-C VITAMIN C PO) Take 1 tablet by mouth daily.    Polyvinyl Alcohol-Povidone (REFRESH OP) Place 1 drop into both eyes 2 (two) times daily.     saccharomyces boulardii (FLORASTOR) 250 MG capsule Take 250 mg by mouth 2 (two) times daily.    temazepam (RESTORIL) 15 MG capsule Take 15 mg by mouth at bedtime as needed for sleep. SLEEP AID    traZODone (DESYREL) 50 MG tablet Take half (1/2) to one (1) tablet (25 mg to 50 mg total) by mouth daily at bedtime as needed for sleep.    vancomycin (VANCOCIN) 125 MG capsule Take 1 capsule (125 mg total) by mouth 4 (four) times daily. For 10 days then take 1 capsule by mouth every 3 days for 10 days Qty: 50 capsule, Refills: 0    nitroGLYCERIN (NITROSTAT) 0.4 MG SL tablet Place 1 tablet (0.4 mg total) under the tongue every 5 (five) minutes as needed for chest pain. Qty: 25 tablet, Refills: 1      STOP taking these medications     pantoprazole (PROTONIX) 40 MG  tablet        Allergies  Allergen Reactions  . Ezetimibe Other (See Comments)    Muscle pain= zetia  . Ezetimibe-Simvastatin Other (See Comments)    Muscle pain= vytorin  . Morphine Other (See Comments)    nightmares  . Rosuvastatin Other (See Comments)    Muscle pain  . Ambien [Zolpidem Tartrate] Other (See Comments)    Fell down  . Plaquenil [Hydroxychloroquine Sulfate] Other (See Comments)    Doesn't recall reaction  . Polytrim [Polymyxin B-Trimethoprim] Other (See Comments)    unknown  . Prevacid [Lansoprazole] Other (See Comments)    dizziness   Follow-up Information    WHITE,CYNTHIA S, MD. Schedule an appointment as soon as possible for a visit in 10 day(s).   Specialty:  Family Medicine Contact information: [address] [employer]  [place] [address] [phone]            The results of significant diagnostics from this hospitalization (including imaging, microbiology, ancillary and laboratory) are listed below for reference.    Significant Diagnostic Studies: Ct Abdomen Pelvis W Contrast  Result Date: 06/23/2016 CLINICAL DATA:  Weakness and recent diagnosis of C. difficile infection EXAM: CT ABDOMEN AND PELVIS WITH CONTRAST TECHNIQUE: Multidetector CT imaging of the abdomen and pelvis was performed using the standard protocol following bolus administration of intravenous contrast. CONTRAST:  ISOVUE-300 IOPAMIDOL (ISOVUE-300) INJECTION 61% COMPARISON:  02/20/2006 FINDINGS: Lower chest: No acute abnormality. Hepatobiliary: No focal liver abnormality is seen. Status post cholecystectomy. No biliary dilatation. Pancreas: Unremarkable. No pancreatic ductal dilatation or surrounding inflammatory changes. Spleen: Normal in size without focal abnormality. Adrenals/Urinary Tract: Adrenal glands are within normal limits. A small right renal cyst is seen. No calculi or obstructive changes are noted. The bladder is well distended. Stomach/Bowel: The colon is  predominately decompressed with enhancing mucosa consistent with inflammatory changes well as some pericolonic inflammatory change. No obstructive changes are seen. These findings are consistent with the given clinical history. The appendix has been surgically removed. Vascular/Lymphatic: Aortic atherosclerosis. No enlarged abdominal or pelvic lymph nodes. Reproductive: Status post hysterectomy. No adnexal masses. Other: No abdominal wall hernia or abnormality. No abdominopelvic ascites. Musculoskeletal: L1 and L3 compression deformities are noted with overall chronic appearance. Diffuse osteopenia is noted. IMPRESSION: Inflammatory changes in the colon predominately in the right colon consistent with the given clinical history of C. difficile infection. L1 and L3 compression deformities likely of a chronic nature given their appearance. No other focal abnormality is noted. Electronically Signed   By: Alcide Clever M.D.   On: 06/23/2016 17:35    Microbiology: Recent Results (from the past 240 hour(s))  Clostridium Difficile by PCR     Status: Abnormal   Collection Time: 06/19/16 12:42 PM  Result Value Ref Range Status   Toxigenic C Difficile by pcr Detected (AA) Not Detected Final    Comment: This test is for use only with liquid or soft stools; performance characteristics of other clinical specimen types have not been established.   This assay was performed by Cepheid GeneXpert(R) PCR. The performance characteristics of this assay have been determined by [employer]. Performance characteristics refer to the analytical performance of the test.      Labs: Basic Metabolic Panel:  Recent Labs Lab 06/23/16 1320 06/24/16 0634  NA 132* 133*  K 4.1 3.8  CL 100* 107  CO2 22 20*  GLUCOSE 110* 85  BUN 13 8  CREATININE 1.03* 0.71  CALCIUM 9.5 8.4*   Liver Function Tests:  Recent Labs Lab 06/23/16 1320  AST 22  ALT 12*  ALKPHOS 71  BILITOT 0.8  PROT 6.4*  ALBUMIN 3.4*     Recent Labs Lab 06/23/16 1320  LIPASE 16   CBC:  Recent Labs Lab 06/23/16 1320 06/24/16 0634  WBC 17.3* 14.2*  HGB 13.2 10.7*  HCT 39.5 32.9*  MCV 79.3 79.3  PLT 264 235    Signed:  Vassie Loll MD.  [facility] 06/24/2016, 11:53 AM

## 2016-06-30 NOTE — Telephone Encounter (Signed)
She was briefly admitted and dced Please get a sxs update from her  thanks

## 2016-06-30 NOTE — Telephone Encounter (Signed)
[name] is feeling much better she said, no fever, no diarrhea.  She said she is still real weak.  She started the vancomycin yesterday and got in half a dose as the pharmacy had to order more which she got today.  She started her generic Pepcid today. She has a f/u appt with Dr Leone Payor on 07/30/16 at 11:30AM.  Is that soon enough Sir?

## 2016-06-30 NOTE — Telephone Encounter (Signed)
Left her a message to call me back. 

## 2016-07-01 ENCOUNTER — Telehealth: Payer: Self-pay | Admitting: Internal Medicine

## 2016-07-01 NOTE — Telephone Encounter (Signed)
See message from the patient 

## 2016-07-02 NOTE — Telephone Encounter (Signed)
Left her a voice mail message passing on Dr Marvell Fuller message.

## 2016-07-02 NOTE — Telephone Encounter (Signed)
That should be ok as long as she is getting better. If she feels otherwise call back sooner and we will get her in.

## 2016-07-04 ENCOUNTER — Ambulatory Visit: Payer: Medicare Other | Admitting: Cardiovascular Disease

## 2016-07-29 ENCOUNTER — Telehealth: Payer: Self-pay | Admitting: Internal Medicine

## 2016-07-29 NOTE — Telephone Encounter (Signed)
I spoke with Ms. [name].  She will call me back after the service for her sister and we will arrange a new follow up.

## 2016-07-30 ENCOUNTER — Ambulatory Visit: Payer: Medicare Other | Admitting: Internal Medicine

## 2016-08-18 ENCOUNTER — Telehealth: Payer: Self-pay | Admitting: Internal Medicine

## 2016-08-18 MED ORDER — VANCOMYCIN HCL 125 MG PO CAPS: 125.0000 mg | ORAL_CAPSULE | Freq: Four times a day (QID) | ORAL | 0 refills | Status: DC

## 2016-08-18 NOTE — Telephone Encounter (Signed)
OK C diff still + so probably having disease   Would retreat with vancomycin 125 mg qid x 10 days then 1 dose every 3 days x 10 doses total - # 50 no refill  Oral rehydration  If she feels terrible and weak then ED visit is ok - may need IVF's  If she decides to go to ED she should wait to get the Rx

## 2016-08-18 NOTE — Telephone Encounter (Signed)
Left message for patient to call back rx sent to the pharmacy

## 2016-08-18 NOTE — Telephone Encounter (Signed)
Patient notified of the recommendations and she will call back with an update in a few week.s

## 2016-09-12 ENCOUNTER — Encounter: Payer: Self-pay | Admitting: Internal Medicine

## 2016-09-12 ENCOUNTER — Encounter (INDEPENDENT_AMBULATORY_CARE_PROVIDER_SITE_OTHER): Payer: Self-pay

## 2016-09-12 ENCOUNTER — Ambulatory Visit (INDEPENDENT_AMBULATORY_CARE_PROVIDER_SITE_OTHER): Payer: Medicare Other | Admitting: Internal Medicine

## 2016-09-12 DIAGNOSIS — A0472 Enterocolitis due to Clostridium difficile, not specified as recurrent: Secondary | ICD-10-CM | POA: Diagnosis not present

## 2016-09-12 NOTE — Assessment & Plan Note (Addendum)
4 episodes overall, since 2017 more recent Second episode now successfully tx If recurs vancomycin - taper vs FMT she is not inclined to pursue a fecal transplant right now but if she gets another that she will

## 2016-09-12 NOTE — Progress Notes (Signed)
RANIYA WOOLARD 81 y.o. [birth year] [id number]  Assessment & Plan:   Encounter Diagnosis  Name Primary?  . C. difficile colitis - recurrent     C. difficile colitis - recurrent 4 episodes overall, since 2017 more recent Second episode now successfully tx If recurs vancomycin - taper vs FMT she is not inclined to pursue a fecal transplant right now but if she gets another that she will  I appreciate the opportunity to care for this patient. CC: Laurann Montana, MD   Subjective:   Chief Complaint: C diff  HPI The patient is here she had a fourth episode of Clostridium difficile colitis with watery diarrhea and a positive PCR. Treated with a ten-day course of vancomycin 125 mg 4 times a day started 64 and then a dose taper and is asymptomatic for the most part right now. Normal stools with some intermittent softer stools. No watery diarrhea. Allergies  Allergen Reactions  . Ezetimibe Other (See Comments)    Muscle pain= zetia  . Ezetimibe-Simvastatin Other (See Comments)    Muscle pain= vytorin  . Morphine Other (See Comments)    nightmares  . Rosuvastatin Other (See Comments)    Muscle pain  . Ambien [Zolpidem Tartrate] Other (See Comments)    Fell down  . Plaquenil [Hydroxychloroquine Sulfate] Other (See Comments)    Doesn't recall reaction  . Polytrim [Polymyxin B-Trimethoprim] Other (See Comments)    unknown  . Prevacid [Lansoprazole] Other (See Comments)    dizziness   Current Meds  Medication Sig  . amLODipine (NORVASC) 2.5 MG tablet Take 2.5 mg by mouth daily.  [name] aspirin 81 MG tablet Take 81 mg by mouth daily.  . Biotin 10 MG TABS Take 10 mg by mouth daily.  . calcium carbonate (ANTACID) 420 MG CHEW chewable tablet Chew 420 mg by mouth daily.  . clopidogrel (PLAVIX) 75 MG tablet Take 1 tablet (75 mg total) by mouth daily with breakfast.  . colestipol (COLESTID) 5 g granules Take 5 g by mouth daily with supper.  . cyclobenzaprine (FLEXERIL) 10 MG tablet Take 10  mg by mouth 3 (three) times daily as needed for muscle spasms.  . famotidine (PEPCID) 20 MG tablet Take 1 tablet (20 mg total) by mouth daily.  . feeding supplement, ENSURE ENLIVE, (ENSURE ENLIVE) LIQD Take 237 mLs by mouth 2 (two) times daily between meals.  . ferrous sulfate 325 (65 FE) MG tablet Take 325 mg by mouth daily with breakfast.  . fluticasone (FLONASE ALLERGY RELIEF) 50 MCG/ACT nasal spray Place 1 spray into the nose daily as needed for allergies.  . isosorbide mononitrate (IMDUR) 30 MG 24 hr tablet Take 1 tablet (30 mg total) by mouth daily.  [name] loratadine (CLARITIN) 10 MG tablet Take 10 mg by mouth daily.  . metoprolol succinate (TOPROL-XL) 25 MG 24 hr tablet Take 12.5 mg by mouth daily.  . Multiple Vitamins-Minerals (EMERGEN-C VITAMIN C PO) Take 1 tablet by mouth daily.  . nitroGLYCERIN (NITROSTAT) 0.4 MG SL tablet Place 1 tablet (0.4 mg total) under the tongue every 5 (five) minutes as needed for chest pain.  . Polyvinyl Alcohol-Povidone (REFRESH OP) Place 1 drop into both eyes 2 (two) times daily.   [name] saccharomyces boulardii (FLORASTOR) 250 MG capsule Take 250 mg by mouth 2 (two) times daily.  . temazepam (RESTORIL) 15 MG capsule Take 15 mg by mouth at bedtime as needed for sleep. SLEEP AID  . traZODone (DESYREL) 50 MG tablet Take half (1/2) to one (1)  tablet (25 mg to 50 mg total) by mouth daily at bedtime as needed for sleep.  . [DISCONTINUED] vancomycin (VANCOCIN) 125 MG capsule Take 1 capsule (125 mg total) by mouth 4 (four) times daily. For 10 days then take 1 capsule by mouth every 3 days for 10 doses   Past Medical History:  Diagnosis Date  . Abnormal nuclear stress test 05/24/2015  . C. difficile colitis - recurrent 06/23/2016  . C. difficile diarrhea on po vanc 10/04/2015  . CAD (coronary artery disease) 2006, 2016   a. 08/2014 NSTEMI: Attempted PCI of 85% RPDA - unable to wire, complicated by dissection, case aborted;  b. 08/2014 Relook Cath: LM nl, LAD 60p, SP1 90ost  (small), D1 65ost, RI 45/50, RCA nl, RPDA 90 - healed dissection;  b. 08/2014 Echo: EF 55%, mild/mon midapical inferior HK, mild AI, PASP . c. Cath 05/2015 with CTO of the right-PDA and unsuccessful PCI  . chronic back   . CKD (chronic kidney disease), stage II   . COLONIC POLYPS, ADENOMATOUS, HX OF   . Diverticulitis   . Dyslipidemia    a. statin intolerant.  . Elevated troponin level 10/04/2015  . ENDOMETRIOSIS   . Essential hypertension   . Fibromyalgia   . GASTRIC ANTRAL VASCULAR ECTASIA   . GERD   . History of hiatal hernia   . Hypertension, uncontrolled 10/04/2015  . Insomnia   . IRON DEFICIENCY ANEMIA SECONDARY TO BLOOD LOSS   . Myocardial infarction (HCC) 08/2014  . Neuropathy   . NSTEMI (non-ST elevated myocardial infarction) (HCC) 08/18/2014  . Osteoarthritis    "back, hips, basically all over" (05/24/2015)  . Raynaud's syndrome   . Rosacea, acne   . Sicca syndrome (HCC)   . SJOGREN'S SYNDROME   . SUPRAVENTRICULAR TACHYCARDIA   . Vitamin D deficiency    Past Surgical History:  Procedure Laterality Date  . APPENDECTOMY    . BREAST BIOPSY Bilateral    "both were negative"  . BREAST LUMPECTOMY Right X 2  . BREAST LUMPECTOMY WITH RADIOACTIVE SEED LOCALIZATION Right 01/24/2014   Procedure: BREAST LUMPECTOMY WITH RADIOACTIVE SEED LOCALIZATION;  Surgeon: Avel Peace, MD;  Location: [facility];  Service: General;  Laterality: Right;  . CARDIAC CATHETERIZATION N/A 08/18/2014   Procedure: Left Heart Cath and Coronary Angiography;  Surgeon: Marykay Lex, MD;  Location: [facility] INVASIVE CV LAB;  Service: Cardiovascular;  Laterality: N/A;  . CARDIAC CATHETERIZATION  08/18/2014   Procedure: Coronary Balloon Angioplasty;  Surgeon: Marykay Lex, MD;  Location: [facility] INVASIVE CV LAB;  Service: Cardiovascular;;  . CARDIAC CATHETERIZATION N/A 08/21/2014   Procedure: Left Heart Cath and Coronary Angiography;  Surgeon: Marykay Lex, MD;  Location: [facility] INVASIVE CV LAB;   Service: Cardiovascular;  Laterality: N/A;  . CARDIAC CATHETERIZATION  2006  . CARDIAC CATHETERIZATION N/A 05/24/2015   Procedure: Left Heart Cath and Coronary Angiography;  Surgeon: Tonny Bollman, MD;  Location: [facility] INVASIVE CV LAB;  Service: Cardiovascular;  Laterality: N/A;  . COLONOSCOPY W/ BIOPSIES AND POLYPECTOMY  12/17/2006   adenomatous polyps, external hemorrhoids  . ESOPHAGOGASTRODUODENOSCOPY  11/27/2009   APC tretment of lesions  . EYE SURGERY    . HERNIA REPAIR    . HIATAL HERNIA REPAIR  2005   and paraesophageal  . LAPAROSCOPIC CHOLECYSTECTOMY    . TEAR DUCT PROBING Right    "no plugs or anything"  . TOTAL ABDOMINAL HYSTERECTOMY     w/BSO   Social History   Social History  .  Marital status: Married    Spouse name: N/A  . Number of children: 3  . Years of education: college   Occupational History  . retired    Social History Main Topics  . Smoking status: Never Smoker  . Smokeless tobacco: Never Used  . Alcohol use Yes  . Drug use: No  . Sexual activity: Not Currently   Other Topics Concern  . Not on file   Social History Narrative   Patient drinks caffeine occasionally.   Patient is right handed.      family history includes Breast cancer in her daughter, sister, sister, and sister; Cancer in her daughter and sister; Coronary artery disease in her brother, brother, brother, father, and mother; Diabetes in her mother; Heart attack in her father; Heart disease in her brother, father, and mother; Heart failure in her mother; Hyperlipidemia in her mother; Hypertension in her father and mother; Kidney failure in her mother; Peripheral vascular disease in her mother.   Review of Systems As above  Objective:   Physical Exam BP 100/60   Pulse 68   Ht 5\' 2"  (1.575 m)   Wt 126 lb 6 oz (57.3 kg)   BMI 23.11 kg/m  Elderly no acute distress  15 minutes time spent with patient > half in counseling coordination of care

## 2016-09-12 NOTE — Patient Instructions (Signed)
  Let us know if the diarrhea returns.   Follow up with Dr Leone Payor as needed.    I appreciate the opportunity to care for you. Stan Head, MD, [facility]

## 2016-10-02 ENCOUNTER — Telehealth: Payer: Self-pay | Admitting: Internal Medicine

## 2016-10-02 MED ORDER — VANCOMYCIN HCL 125 MG PO CAPS: 125.0000 mg | ORAL_CAPSULE | Freq: Four times a day (QID) | ORAL | 0 refills | Status: DC

## 2016-10-02 NOTE — Telephone Encounter (Signed)
Patient reports that she is still having semi liquid stools several times a day everyday. 3 since this am.  Large amount with each BM.  She is asking about FMT not a capsule endoscopy.

## 2016-10-02 NOTE — Telephone Encounter (Signed)
OK  So right now we need to restart vancomycin 125 mg qid x 2 weeks and I will then work out a taper and get her set up for an FMT.  We need to get the C diff back under control.  I will contact her also today/tomorrow to discuss.  Route this back to me after you call her and do rx

## 2016-10-02 NOTE — Telephone Encounter (Signed)
Patient asking about a capsule endoscopy for diarrhea. I don't see any mention in the chart.  Please advise

## 2016-10-02 NOTE — Telephone Encounter (Signed)
Left message for patient to call back  

## 2016-10-02 NOTE — Telephone Encounter (Signed)
If she is having recurrent severe diarrhea I must assume that the C. difficile is back. When that she last take vancomycin?  A capsule endoscopy is not necessary or indicated in my opinion

## 2016-10-02 NOTE — Telephone Encounter (Signed)
Patient notified of the recommendations rx sent to [facility] She is notified that Dr. Leone Payor will call her in the next few days to discuss further

## 2016-10-13 ENCOUNTER — Other Ambulatory Visit: Payer: Self-pay | Admitting: Cardiovascular Disease

## 2016-10-13 ENCOUNTER — Telehealth: Payer: Self-pay | Admitting: Internal Medicine

## 2016-10-13 NOTE — Telephone Encounter (Signed)
Patient called to report that she is having pure watery stools.  She did not restart the vancomycin that was sent in on 10/02/16.  She will go to [facility] now to pick this up.  She will call after she has been on it for two weeks to discuss the FMT and vancomycin taper.  See phone notes from 10/02/16.  She does not know how she missed going to pick this up.

## 2016-10-16 NOTE — Telephone Encounter (Signed)
Spoke with Meenakshi and she said she's doing better. No fever. No bowel movements today but also hasn't eaten today. Stated she's been too busy with a home project to eat.  Trying to get the house ready to sale, moving to [employer]. Says she's just very tired she states.  She also stated that she had severe left arm pain Tues night, called her son who's a Dr and he told her to take her nitro which she did and it helped.  She is going to call her heart Dr and I encouraged her to do this ASAP as she said she's overdue to see them.

## 2016-10-16 NOTE — Telephone Encounter (Signed)
Please get a symptom update

## 2016-10-20 ENCOUNTER — Telehealth: Payer: Self-pay | Admitting: Cardiovascular Disease

## 2016-10-20 NOTE — Telephone Encounter (Signed)
Attempted to reach the pt at home number but phone line is busy.   Reviewed chart and the pt made GI aware of symptoms on 10/16/16 and they advised that she contact cardiology to schedule follow up.

## 2016-10-20 NOTE — Telephone Encounter (Signed)
New message  Pt c/o of Chest Pain: STAT if CP now or developed within 24 hours  1. Are you having CP right now? No  2. Are you experiencing any other symptoms (ex. SOB, nausea, vomiting, sweating)? Leg arm pain, weakness,   3. How long have you been experiencing CP? 2-3 weeks   4. Is your CP continuous or coming and going? Coming and going   5. Have you taken Nitroglycerin? Yes  ?   Pt call requesting to speak with RN. Pt states she needs to see Dr. Excell Seltzer as soo as possible. Pt states she

## 2016-10-21 NOTE — Telephone Encounter (Signed)
I attempted to reach the pt at home number two times but someone picks up the phone and hangs up.   I called the pt's mobile number and left a voicemail for the pt to contact the office.

## 2016-10-22 MED ORDER — VANCOMYCIN HCL 125 MG PO CAPS: ORAL_CAPSULE | ORAL | 0 refills | Status: DC

## 2016-10-22 NOTE — Telephone Encounter (Signed)
I called [facility] and gave the vancomycin rx verbally to [place].  I told him she will pick it up in a few days.  I called to let Hallie know and they are still on the way to [place], got lost in [place].  She said she doesn't know her schedule and will call back to make the September appointment.

## 2016-10-22 NOTE — Telephone Encounter (Signed)
Spoke to patient - semi-solid stools - 6 d of qid vancomycin left. I am rxing a taper  Please call into pharmacy ([facility]) vancomycin 125 mg   Take tid x 5 d Bid x 5d Qd x 5 d Qod x 5 doses q 3 d x 5 doses disp # 40 no refill  Let them know she will pick it up in a few days - she went to [place], [place] for 2 d  Please also contact her and get her a September appointment

## 2016-10-22 NOTE — Addendum Note (Signed)
Addended by: [place], Wendelin Bradt E on: 10/22/2016 06:04 PM   Modules accepted: Orders

## 2016-10-24 NOTE — Telephone Encounter (Signed)
Patient states that she has been having intermittent episodes of chest discomfort at rest with left arm numbness for the past month. Patient states that her symptoms are relieved with one NTG. Patient denies having any symptoms at this present time. Patient states that she is taking her meds as directed. Patient requesting to see Dr. Excell Seltzer. Offered patient first available appointment with APP on Wednesday and patient refused. Patient states that she only wants to see Dr. Excell Seltzer and will wait for her scheduled appointment in November.

## 2016-10-24 NOTE — Telephone Encounter (Signed)
Follow Up ° ° °Pt calling back °

## 2016-10-30 ENCOUNTER — Other Ambulatory Visit: Payer: Self-pay | Admitting: Family Medicine

## 2016-10-30 DIAGNOSIS — N6310 Unspecified lump in the right breast, unspecified quadrant: Secondary | ICD-10-CM

## 2016-11-03 ENCOUNTER — Telehealth: Payer: Self-pay | Admitting: Internal Medicine

## 2016-11-03 NOTE — Telephone Encounter (Signed)
Patient reports that her stools are still loose, but not watery. She is on the last week of her taper.  Would like to discuss FMT.

## 2016-11-04 NOTE — Telephone Encounter (Signed)
Spoke to her and will make plans to do FMT  Hopefully next week

## 2016-11-04 NOTE — Telephone Encounter (Signed)
Please schedule for colonoscopy w/ FMT 8/31 at [facility] as of right now they have openings  I am off that day but will do the case   needs a previsit  I have explained everything to her  I want her to  take the whole prep the night before like in old days - want her to get some rest overnight  Dx is C diff colitis   She should take 2 Imadium AD at 0800 that morning with small amount of water She stays on her Plavix

## 2016-11-04 NOTE — Telephone Encounter (Signed)
Patient is scheduled for 11/14/16 [facility] at 10:00.  She will need to arrive at 8:30.     Left message for patient to call back

## 2016-11-05 ENCOUNTER — Other Ambulatory Visit: Payer: Self-pay

## 2016-11-05 ENCOUNTER — Other Ambulatory Visit: Payer: Medicare Other

## 2016-11-05 DIAGNOSIS — A0472 Enterocolitis due to Clostridium difficile, not specified as recurrent: Secondary | ICD-10-CM

## 2016-11-05 NOTE — Telephone Encounter (Signed)
Patient notified of the appt details.  She will come for a pre-visit on 11/06/16 4:00

## 2016-11-06 ENCOUNTER — Ambulatory Visit (AMBULATORY_SURGERY_CENTER): Payer: Self-pay

## 2016-11-06 VITALS — Ht 60.0 in | Wt 124.8 lb

## 2016-11-06 DIAGNOSIS — A0472 Enterocolitis due to Clostridium difficile, not specified as recurrent: Secondary | ICD-10-CM

## 2016-11-06 MED ORDER — METOCLOPRAMIDE HCL 10 MG PO TABS: 10.0000 mg | ORAL_TABLET | Freq: Two times a day (BID) | ORAL | 0 refills | Status: DC

## 2016-11-06 NOTE — Progress Notes (Signed)
No allergies to eggs or soy No diet meds No home oxygen No past problems with anesthesia  Declined emmi 

## 2016-11-06 NOTE — Telephone Encounter (Signed)
I spoke with the [name] and arranged an appointment with Dr Excell Seltzer on 11/10/16.

## 2016-11-07 ENCOUNTER — Ambulatory Visit
Admission: RE | Admit: 2016-11-07 | Discharge: 2016-11-07 | Disposition: A | Discharge status: Home / Self Care | Payer: Medicare Other | Source: Ambulatory Visit | Attending: Family Medicine | Admitting: Family Medicine

## 2016-11-07 DIAGNOSIS — N6310 Unspecified lump in the right breast, unspecified quadrant: Secondary | ICD-10-CM

## 2016-11-10 ENCOUNTER — Ambulatory Visit (INDEPENDENT_AMBULATORY_CARE_PROVIDER_SITE_OTHER): Payer: Medicare Other | Admitting: Cardiovascular Disease

## 2016-11-10 ENCOUNTER — Encounter: Payer: Self-pay | Admitting: Cardiovascular Disease

## 2016-11-10 VITALS — BP 110/60 | HR 66 | Ht 60.0 in | Wt 124.2 lb

## 2016-11-10 DIAGNOSIS — I25119 Atherosclerotic heart disease of native coronary artery with unspecified angina pectoris: Secondary | ICD-10-CM

## 2016-11-10 DIAGNOSIS — I10 Essential (primary) hypertension: Secondary | ICD-10-CM

## 2016-11-10 NOTE — Patient Instructions (Signed)
Medication Instructions:  Your physician recommends that you continue on your current medications as directed. Please refer to the Current Medication list given to you today.   Labwork: None ordered  Testing/Procedures: None ordered  Follow-Up: Keep appointment with Dr. Excell Seltzer on 01/29/17 at 11:00 AM.  Any Other Special Instructions Will Be Listed Below (If Applicable).     If you need a refill on your cardiac medications before your next appointment, please call your pharmacy.

## 2016-11-10 NOTE — Progress Notes (Signed)
Cardiology Office Note Date:  11/11/2016   ID:  [name], DOB 1935/05/04, MRN [id number]  PCP:  Laurann Montana, MD  Cardiologist:  Tonny Bollman, MD    Chief Complaint  Patient presents with  . Chest Pain   History of Present Illness: [name] is a 80 y.o. female who presents for follow-up of CAD. The patient presented with non-ST elevation infarction in 2016. She was noted to have severe stenosis in a PDA branch and PCI was attempted. This was complicated by localized dissection. Medical therapy was pursued. However, in February 2017 she developed worsening exercise tolerance and dyspnea with low-level activity. A stress test showed ischemia in the PDA territory but also in the LAD territory. The stress test was high-risk and cardiac catheterization was done. Stem is rated total occlusion of the PDA. The LAD that was felt to have nonobstructive disease. PCI was attempted but failed because of balloon could not be passed across the total occlusion. The patient has been managed medically since that time. Films were reviewed with interventional colleagues who all agree that medical therapy was the best treatment option for this patient.  She has multiple complaints. These include chest pain and left arm pain. She has fairly marked fatigue. Complains of left leg swelling. She is short of breath with exertion. Under a lot of stress and attributes some of her symptoms to stress. Her sister died earlier this year and she had to identify the body - extremely difficult for her. The patient is scheduled to undergo fecal transplant for CDif in the near future.    Past Medical History:  Diagnosis Date  . Abnormal nuclear stress test 05/24/2015  . C. difficile colitis - recurrent 06/23/2016  . C. difficile diarrhea on po vanc 10/04/2015  . CAD (coronary artery disease) 2006, 2016   a. 08/2014 NSTEMI: Attempted PCI of 85% RPDA - unable to wire, complicated by dissection, case aborted;  b. 08/2014  Relook Cath: LM nl, LAD 60p, SP1 90ost (small), D1 65ost, RI 45/50, RCA nl, RPDA 90 - healed dissection;  b. 08/2014 Echo: EF 55%, mild/mon midapical inferior HK, mild AI, PASP . c. Cath 05/2015 with CTO of the right-PDA and unsuccessful PCI  . chronic back   . CKD (chronic kidney disease), stage II   . COLONIC POLYPS, ADENOMATOUS, HX OF   . Diverticulitis   . Dyslipidemia    a. statin intolerant.  . Elevated troponin level 10/04/2015  . ENDOMETRIOSIS   . Essential hypertension   . Fibromyalgia   . GASTRIC ANTRAL VASCULAR ECTASIA   . GERD   . History of hiatal hernia   . Hypertension, uncontrolled 10/04/2015  . Insomnia   . IRON DEFICIENCY ANEMIA SECONDARY TO BLOOD LOSS   . Myocardial infarction (HCC) 08/2014  . Neuropathy   . NSTEMI (non-ST elevated myocardial infarction) (HCC) 08/18/2014  . Osteoarthritis    "back, hips, basically all over" (05/24/2015)  . Raynaud's syndrome   . Rosacea, acne   . Sicca syndrome (HCC)   . SJOGREN'S SYNDROME   . SUPRAVENTRICULAR TACHYCARDIA   . Vitamin D deficiency     Past Surgical History:  Procedure Laterality Date  . APPENDECTOMY    . BREAST BIOPSY Bilateral    "both were negative"  . BREAST EXCISIONAL BIOPSY Right   . BREAST EXCISIONAL BIOPSY Right   . BREAST LUMPECTOMY WITH RADIOACTIVE SEED LOCALIZATION Right 01/24/2014   Procedure: BREAST LUMPECTOMY WITH RADIOACTIVE SEED LOCALIZATION;  Surgeon: Avel Peace, MD;  Location: [facility];  Service: General;  Laterality: Right;  . CARDIAC CATHETERIZATION N/A 08/18/2014   Procedure: Left Heart Cath and Coronary Angiography;  Surgeon: Marykay Lex, MD;  Location: [facility] INVASIVE CV LAB;  Service: Cardiovascular;  Laterality: N/A;  . CARDIAC CATHETERIZATION  08/18/2014   Procedure: Coronary Balloon Angioplasty;  Surgeon: Marykay Lex, MD;  Location: [facility] INVASIVE CV LAB;  Service: Cardiovascular;;  . CARDIAC CATHETERIZATION N/A 08/21/2014   Procedure: Left Heart Cath and Coronary  Angiography;  Surgeon: Marykay Lex, MD;  Location: [facility] INVASIVE CV LAB;  Service: Cardiovascular;  Laterality: N/A;  . CARDIAC CATHETERIZATION  2006  . CARDIAC CATHETERIZATION N/A 05/24/2015   Procedure: Left Heart Cath and Coronary Angiography;  Surgeon: Tonny Bollman, MD;  Location: [facility] INVASIVE CV LAB;  Service: Cardiovascular;  Laterality: N/A;  . COLONOSCOPY W/ BIOPSIES AND POLYPECTOMY  12/17/2006   adenomatous polyps, external hemorrhoids  . ESOPHAGOGASTRODUODENOSCOPY  11/27/2009   APC tretment of lesions  . EYE SURGERY    . HERNIA REPAIR    . HIATAL HERNIA REPAIR  2005   and paraesophageal  . LAPAROSCOPIC CHOLECYSTECTOMY    . TEAR DUCT PROBING Right    "no plugs or anything"  . TOTAL ABDOMINAL HYSTERECTOMY     w/BSO    Current Outpatient Prescriptions  Medication Sig Dispense Refill  . amLODipine (NORVASC) 2.5 MG tablet Take 2.5 mg by mouth daily.    [name] aspirin 81 MG tablet Take 81 mg by mouth daily.    . Biotin 10 MG TABS Take 10 mg by mouth daily.    . Bisacodyl (DULCOLAX PO) Take 10 mg by mouth once.    . calcium carbonate (ANTACID) 420 MG CHEW chewable tablet Chew 420 mg by mouth daily.    . clopidogrel (PLAVIX) 75 MG tablet Take 1 tablet (75 mg total) by mouth daily with breakfast. 90 tablet 2  . cyclobenzaprine (FLEXERIL) 10 MG tablet Take 10 mg by mouth 3 (three) times daily as needed for muscle spasms.    . famotidine (PEPCID) 20 MG tablet Take 1 tablet (20 mg total) by mouth daily. 30 tablet 1  . feeding supplement, ENSURE ENLIVE, (ENSURE ENLIVE) LIQD Take 237 mLs by mouth 2 (two) times daily between meals.    . ferrous sulfate 325 (65 FE) MG tablet Take 325 mg by mouth daily with breakfast.    . fluticasone (FLONASE ALLERGY RELIEF) 50 MCG/ACT nasal spray Place 1 spray into the nose daily as needed for allergies.    . isosorbide mononitrate (IMDUR) 30 MG 24 hr tablet Take 1 tablet (30 mg total) by mouth daily. 90 tablet 3  . loratadine (CLARITIN) 10 MG tablet Take 10 mg  by mouth daily.    . metoCLOPramide (REGLAN) 10 MG tablet Take 1 tablet (10 mg total) by mouth 2 (two) times daily. 2 tablet 0  . metoprolol succinate (TOPROL-XL) 25 MG 24 hr tablet Take 12.5 mg by mouth daily.    . Multiple Vitamins-Minerals (EMERGEN-C VITAMIN C PO) Take 1 tablet by mouth daily.    . nitroGLYCERIN (NITROSTAT) 0.4 MG SL tablet Place 1 tablet (0.4 mg total) under the tongue every 5 (five) minutes as needed for chest pain. 25 tablet 1  . polyethylene glycol powder (MIRALAX) powder Take 1 Container by mouth once.    . Polyvinyl Alcohol-Povidone (REFRESH OP) Place 1 drop into both eyes 2 (two) times daily.     [name] saccharomyces boulardii (FLORASTOR) 250 MG capsule Take 250  mg by mouth 2 (two) times daily.    . temazepam (RESTORIL) 15 MG capsule Take 15 mg by mouth at bedtime as needed for sleep. SLEEP AID    . traZODone (DESYREL) 50 MG tablet Take half (1/2) to one (1) tablet (25 mg to 50 mg total) by mouth daily at bedtime as needed for sleep.    . vancomycin (VANCOCIN HCL) 125 MG capsule Take tid x 5 days BID x 5 days QD x 5 days QOD x 5 doses Q 3 days x 5 doses 40 capsule 0   No current facility-administered medications for this visit.     Allergies:   Ezetimibe; Ezetimibe-simvastatin; Morphine; Rosuvastatin; Ambien [zolpidem tartrate]; Plaquenil [hydroxychloroquine sulfate]; Polytrim [polymyxin b-trimethoprim]; and Prevacid [lansoprazole]   Social History:  The patient  reports that she has never smoked. She has never used smokeless tobacco. She reports that she drinks alcohol. She reports that she does not use drugs.   Family History:  The patient's family history includes Breast cancer in her daughter, sister, sister, and sister; Cancer in her daughter and sister; Coronary artery disease in her brother, brother, brother, father, and mother; Diabetes in her mother; Heart attack in her father; Heart disease in her brother, father, and mother; Heart failure in her mother;  Hyperlipidemia in her mother; Hypertension in her father and mother; Kidney failure in her mother; Peripheral vascular disease in her mother.    ROS:  Please see the history of present illness.  Otherwise, review of systems is positive for leg swelling, hearing loss, blood in stool, diarrhea, back pain, excessive fatigue, balance problems.  All other systems are reviewed and negative.    PHYSICAL EXAM: VS:  BP 110/60   Pulse 66   Ht 5' (1.524 m)   Wt 124 lb 3.2 oz (56.3 kg)   SpO2 99%   BMI 24.26 kg/m  , BMI Body mass index is 24.26 kg/m. GEN: Well nourished, well developed, in no acute distress  HEENT: normal  Neck: no JVD, no masses. No carotid bruits Cardiac: RRR with 2/6 systolic murmur at the RUSB        Respiratory:  clear to auscultation bilaterally, normal work of breathing GI: soft, nontender, nondistended, + BS MS: no deformity or atrophy  Ext: trace ankle and pretibial edema, pedal pulses 2+= bilaterally Skin: warm and dry, no rash Neuro:  Strength and sensation are intact Psych: euthymic mood, full affect  EKG:  EKG is not ordered today.  Recent Labs: 06/23/2016: ALT 12 06/24/2016: BUN 8; Creatinine, Ser 0.71; Hemoglobin 10.7; Platelets 235; Potassium 3.8; Sodium 133   Lipid Panel     Component Value Date/Time   CHOL 162 08/19/2014 0255   TRIG 98 08/19/2014 0255   HDL 54 08/19/2014 0255   CHOLHDL 3.0 08/19/2014 0255   VLDL 20 08/19/2014 0255   LDLCALC 88 08/19/2014 0255      Wt Readings from Last 3 Encounters:  11/10/16 124 lb 3.2 oz (56.3 kg)  11/06/16 124 lb 12.8 oz (56.6 kg)  09/12/16 126 lb 6 oz (57.3 kg)     Cardiac Studies Reviewed: Echo 05/17/2015: Left ventricle:  The cavity size was normal. Wall thickness was normal. Systolic function was normal. The estimated ejection fraction was in the range of 55% to 60%. Wall motion was normal; there were no regional wall motion abnormalities. Doppler parameters are consistent with abnormal left ventricular  relaxation (grade 1 diastolic dysfunction).  ------------------------------------------------------------------- Aortic valve:   Mildly calcified leaflets. Cusp separation  was normal.  Doppler:   There was mild stenosis.   There was mild regurgitation.    VTI ratio of LVOT to aortic valve: 0.57. Valve area (VTI): 1.61 cm^2. Indexed valve area (VTI): 0.97 cm^2/m^2. Peak velocity ratio of LVOT to aortic valve: 0.5. Valve area (Vmax): 1.43 cm^2. Indexed valve area (Vmax): 0.86 cm^2/m^2. Mean velocity ratio of LVOT to aortic valve: 0.57. Valve area (Vmean): 1.62 cm^2. Indexed valve area (Vmean): 0.98 cm^2/m^2.    Mean gradient (S): 8 mm Hg. Peak gradient (S): 16 mm Hg.  ------------------------------------------------------------------- Aorta:  Aortic root: The aortic root was normal in size. Ascending aorta: The ascending aorta was normal in size.  ------------------------------------------------------------------- Mitral valve:   Calcified annulus. Mildly thickened leaflets . Leaflet separation was normal.  Doppler:  Transvalvular velocity was within the normal range. There was no evidence for stenosis. There was no regurgitation.    Peak gradient (D): 3 mm Hg.  ------------------------------------------------------------------- Left atrium:  The atrium was normal in size.  ------------------------------------------------------------------- Right ventricle:  The cavity size was normal. Wall thickness was normal. Systolic function was normal.  ------------------------------------------------------------------- Pulmonic valve:    The valve appears to be grossly normal. Doppler:  There was trivial regurgitation.  ------------------------------------------------------------------- Tricuspid valve:   Structurally normal valve.   Leaflet separation was normal.  Doppler:  Transvalvular velocity was within the normal range. There was mild  regurgitation.  ------------------------------------------------------------------- Right atrium:  The atrium was normal in size.  ------------------------------------------------------------------- Pericardium:  There was no pericardial effusion.  ------------------------------------------------------------------- Systemic veins: Inferior vena cava: The vessel was dilated. The respirophasic diameter changes were blunted (< 50%), consistent with elevated central venous pressure.  ASSESSMENT AND PLAN: 1.  CAD, native vessel, with angina: anti-anginal Rx includes amlodipine, imdur, and metoprolol succinate. Pt complains of multiple symptoms and has profound fatigue. I don't think she will tolerate further titration of anti-anginals. Suspect much of her angina is stress-related. Discussed at length today. She has undergone repeated cath procedures. Plans to relocate to [facility] in near future.   2. Hypertension: BP well-controlled.   3. Dyslipidemia: Patient cannot take statin drugs or zetia.  4. Aortic stenosis: very mild. Likely source of her murmur. Most recent echo reviewed.   Current medicines are reviewed with the patient today.  The patient does not have concerns regarding medicines.  Labs/ tests ordered today include:  No orders of the defined types were placed in this encounter.   Disposition:   FU as scheduled  Signed, Tonny Bollman, MD  11/11/2016 5:54 AM    [facility] [address] [place], [place], [place]  [address] Phone: [phone]; Fax: [phone]

## 2016-11-12 ENCOUNTER — Other Ambulatory Visit: Payer: Self-pay | Admitting: Family Medicine

## 2016-11-12 ENCOUNTER — Ambulatory Visit
Admission: RE | Admit: 2016-11-12 | Discharge: 2016-11-12 | Disposition: A | Discharge status: Home / Self Care | Payer: Medicare Other | Source: Ambulatory Visit | Attending: Family Medicine | Admitting: Family Medicine

## 2016-11-12 DIAGNOSIS — M25512 Pain in left shoulder: Secondary | ICD-10-CM

## 2016-11-13 ENCOUNTER — Ambulatory Visit (HOSPITAL_COMMUNITY): Payer: Medicare Other | Admitting: Emergency Medicine

## 2016-11-13 ENCOUNTER — Encounter (HOSPITAL_COMMUNITY): Payer: Self-pay | Admitting: *Deleted

## 2016-11-13 NOTE — Progress Notes (Signed)
Anesthesia Chart Review:  Pt is a same day work up.   Pt is a 81 year old female scheduled for colonoscopy, fecal transplant for C diff colitis on 11/14/2016 with Stan Head, MD  - Cardiologist is Tonny Bollman, MD who is aware of upcoming procedure.  Last office visit 11/10/16.   PMH includes:  CAD (NSTEMI 2016; CTO of R PDA, mild-moderate disease otherwise), SVT, HTN, dyslipidemia, CKD (stage III), Sjogren's syndrome, sicca syndrome, Raynaud's, iron deficiency anemia, GERD. Never smoker. BMI 24. S/p breast lumpectomy 01/24/14.   Medications include: Amlodipine, ASA 81 mg, Plavix, Pepcid, iron, Imdur, metoprolol  Labs will be obtained per endo protocol.  CXR 04/25/16: No acute abnormalities. Aortic atherosclerosis.  EKG 06/23/16: Sinus rhythm. RSR' in V1 or V2, probably normal variant  Cardiac cath 05/24/15:   Prox RCA lesion, 25% stenosed.  Dist RCA lesion, 25% stenosed.  Prox Cx to Mid Cx lesion, 50% stenosed.  Prox LAD lesion, 50% stenosed.  Dist LAD lesion, 25% stenosed.  RPDA lesion, 100% stenosed. Post intervention, there is a 100% residual stenosis. 1. Severe single-vessel coronary artery disease with total occlusion of the right PDA, attempted PCI unsuccessful due to inability to cross lesion 2. Heavily calcified coronary arteries with mild to moderate diffuse CAD involving the LAD and left circumflex, appropriate for medical therapy 3. Normal LV function by noninvasive assessment  Echo 05/17/15:  - Left ventricle: The cavity size was normal. Wall thickness was normal. Systolic function was normal. The estimated ejection fraction was in the range of 55% to 60%. Wall motion was normal; there were no regional wall motion abnormalities. Doppler parameters are consistent with abnormal left ventricular relaxation (grade 1 diastolic dysfunction). - Aortic valve: There was mild stenosis. There was mild regurgitation. Mean gradient (S): 8 mm Hg. Peak gradient (S): 16 mm Hg. - Mitral  valve: Calcified annulus. Mildly thickened leaflets . - Pulmonary arteries: PA peak pressure: 34 mm Hg (S).  Carotid duplex 08/07/14:  - 1-49% stenoses of the B proximal ICAs   If no changes, I anticipate pt can proceed with surgery as scheduled.   Rica Mast, FNP-BC [facility]Anesthesiology Phone: [phone] 11/13/2016 9:50 AM

## 2016-11-14 ENCOUNTER — Other Ambulatory Visit: Payer: Self-pay

## 2016-11-14 ENCOUNTER — Emergency Department (HOSPITAL_COMMUNITY)
Admission: EM | Admit: 2016-11-14 | Discharge: 2016-11-14 | Disposition: A | Discharge status: Home / Self Care | Payer: Medicare Other

## 2016-11-14 ENCOUNTER — Encounter (HOSPITAL_COMMUNITY)
Admission: RE | Disposition: A | Discharge status: Home / Self Care | Payer: Self-pay | Source: Ambulatory Visit | Attending: Emergency Medicine

## 2016-11-14 ENCOUNTER — Encounter (HOSPITAL_COMMUNITY): Payer: Self-pay | Admitting: *Deleted

## 2016-11-14 ENCOUNTER — Ambulatory Visit (HOSPITAL_COMMUNITY)
Admission: RE | Admit: 2016-11-14 | Discharge: 2016-11-14 | Disposition: A | Discharge status: Home / Self Care | Payer: Medicare Other | Source: Ambulatory Visit | Attending: Emergency Medicine | Admitting: Emergency Medicine

## 2016-11-14 ENCOUNTER — Ambulatory Visit (HOSPITAL_COMMUNITY): Payer: Medicare Other

## 2016-11-14 DIAGNOSIS — D509 Iron deficiency anemia, unspecified: Secondary | ICD-10-CM | POA: Diagnosis not present

## 2016-11-14 DIAGNOSIS — I471 Supraventricular tachycardia: Secondary | ICD-10-CM | POA: Diagnosis not present

## 2016-11-14 DIAGNOSIS — I739 Peripheral vascular disease, unspecified: Secondary | ICD-10-CM | POA: Insufficient documentation

## 2016-11-14 DIAGNOSIS — A0472 Enterocolitis due to Clostridium difficile, not specified as recurrent: Secondary | ICD-10-CM

## 2016-11-14 DIAGNOSIS — I129 Hypertensive chronic kidney disease with stage 1 through stage 4 chronic kidney disease, or unspecified chronic kidney disease: Secondary | ICD-10-CM | POA: Diagnosis not present

## 2016-11-14 DIAGNOSIS — R079 Chest pain, unspecified: Secondary | ICD-10-CM | POA: Diagnosis present

## 2016-11-14 DIAGNOSIS — I2510 Atherosclerotic heart disease of native coronary artery without angina pectoris: Secondary | ICD-10-CM | POA: Diagnosis not present

## 2016-11-14 DIAGNOSIS — R072 Precordial pain: Secondary | ICD-10-CM

## 2016-11-14 DIAGNOSIS — N182 Chronic kidney disease, stage 2 (mild): Secondary | ICD-10-CM | POA: Diagnosis not present

## 2016-11-14 DIAGNOSIS — E559 Vitamin D deficiency, unspecified: Secondary | ICD-10-CM | POA: Diagnosis not present

## 2016-11-14 DIAGNOSIS — I10 Essential (primary) hypertension: Secondary | ICD-10-CM

## 2016-11-14 DIAGNOSIS — Z79899 Other long term (current) drug therapy: Secondary | ICD-10-CM | POA: Diagnosis not present

## 2016-11-14 DIAGNOSIS — I7300 Raynaud's syndrome without gangrene: Secondary | ICD-10-CM | POA: Diagnosis not present

## 2016-11-14 DIAGNOSIS — I252 Old myocardial infarction: Secondary | ICD-10-CM | POA: Diagnosis not present

## 2016-11-14 DIAGNOSIS — E785 Hyperlipidemia, unspecified: Secondary | ICD-10-CM | POA: Insufficient documentation

## 2016-11-14 DIAGNOSIS — R0602 Shortness of breath: Secondary | ICD-10-CM

## 2016-11-14 DIAGNOSIS — M797 Fibromyalgia: Secondary | ICD-10-CM | POA: Diagnosis not present

## 2016-11-14 DIAGNOSIS — K219 Gastro-esophageal reflux disease without esophagitis: Secondary | ICD-10-CM | POA: Insufficient documentation

## 2016-11-14 DIAGNOSIS — Z7982 Long term (current) use of aspirin: Secondary | ICD-10-CM | POA: Diagnosis not present

## 2016-11-14 DIAGNOSIS — A0471 Enterocolitis due to Clostridium difficile, recurrent: Secondary | ICD-10-CM | POA: Insufficient documentation

## 2016-11-14 DIAGNOSIS — M3500 Sicca syndrome, unspecified: Secondary | ICD-10-CM | POA: Diagnosis not present

## 2016-11-14 DIAGNOSIS — Z7902 Long term (current) use of antithrombotics/antiplatelets: Secondary | ICD-10-CM | POA: Insufficient documentation

## 2016-11-14 DIAGNOSIS — K449 Diaphragmatic hernia without obstruction or gangrene: Secondary | ICD-10-CM | POA: Diagnosis not present

## 2016-11-14 HISTORY — DX: Dyspnea, unspecified: R06.00

## 2016-11-14 LAB — CBC WITH DIFFERENTIAL/PLATELET
BASOS ABS: 0 10*3/uL (ref 0.0–0.1)
Basophils Relative: 0 %
EOS ABS: 0.1 10*3/uL (ref 0.0–0.7)
Eosinophils Relative: 1 %
HEMATOCRIT: 38.1 % (ref 36.0–46.0)
HEMOGLOBIN: 12.3 g/dL (ref 12.0–15.0)
Lymphocytes Relative: 21 %
Lymphs Abs: 1.3 10*3/uL (ref 0.7–4.0)
MCH: 27.4 pg (ref 26.0–34.0)
MCHC: 32.3 g/dL (ref 30.0–36.0)
MCV: 84.9 fL (ref 78.0–100.0)
Monocytes Absolute: 0.7 10*3/uL (ref 0.1–1.0)
Monocytes Relative: 12 %
NEUTROS ABS: 4.1 10*3/uL (ref 1.7–7.7)
NEUTROS PCT: 66 %
Platelets: 153 10*3/uL (ref 150–400)
RBC: 4.49 MIL/uL (ref 3.87–5.11)
RDW: 15.8 % — ABNORMAL HIGH (ref 11.5–15.5)
WBC: 6.2 10*3/uL (ref 4.0–10.5)

## 2016-11-14 LAB — BASIC METABOLIC PANEL
Anion gap: 8 (ref 5–15)
CHLORIDE: 105 mmol/L (ref 101–111)
CO2: 20 mmol/L — ABNORMAL LOW (ref 22–32)
Calcium: 9.5 mg/dL (ref 8.9–10.3)
Creatinine, Ser: 0.82 mg/dL (ref 0.44–1.00)
GFR calc non Af Amer: >60 mL/min (ref >60)
Glucose, Bld: 80 mg/dL (ref 65–99)
Potassium: 4.5 mmol/L (ref 3.5–5.1)
SODIUM: 133 mmol/L — AB (ref 135–145)

## 2016-11-14 LAB — I-STAT TROPONIN, ED: Troponin i, poc: 0.01 ng/mL (ref 0.00–0.08)

## 2016-11-14 SURGERY — CANCELLED PROCEDURE
Anesthesia: Monitor Anesthesia Care

## 2016-11-14 MED ORDER — LACTATED RINGERS IV SOLN
INTRAVENOUS | Status: DC
  Administered 2016-11-14: 1000 mL via INTRAVENOUS

## 2016-11-14 MED ORDER — SODIUM CHLORIDE 0.9 % IV SOLN: INTRAVENOUS | Status: DC

## 2016-11-14 SURGICAL SUPPLY — 22 items

## 2016-11-14 NOTE — ED Provider Notes (Signed)
[facility]-EMERGENCY DEPT Provider Note   CSN: [id number] Arrival date & time: 11/14/16  1202     History   Chief Complaint Chief Complaint  Patient presents with  . Chest Pain    HPI [name] is a 81 y.o. female.  81 year old female presents with acute onset of chest pressure began today at rest. Patient was to have a fecal transplant due to a 2 year history of C. difficile when she had a sudden onset of chest pressure lasted for several minutes. No associated dyspnea or diaphoresis. She has chronic nausea. Took 2 nitroglycerin which relieved her symptoms. No recent fever cough or congestion. No recent exertional chest pain. According to the patient, she does have a history of CAD. She had an STEMI in 2016 and there was an attempted PCI but unsuccessful.      Past Medical History:  Diagnosis Date  . Abnormal nuclear stress test 05/24/2015  . C. difficile colitis - recurrent 06/23/2016  . C. difficile diarrhea on po vanc 10/04/2015  . CAD (coronary artery disease) 2006, 2016   a. 08/2014 NSTEMI: Attempted PCI of 85% RPDA - unable to wire, complicated by dissection, case aborted;  b. 08/2014 Relook Cath: LM nl, LAD 60p, SP1 90ost (small), D1 65ost, RI 45/50, RCA nl, RPDA 90 - healed dissection;  b. 08/2014 Echo: EF 55%, mild/mon midapical inferior HK, mild AI, PASP . c. Cath 05/2015 with CTO of the right-PDA and unsuccessful PCI  . chronic back   . CKD (chronic kidney disease), stage II   . COLONIC POLYPS, ADENOMATOUS, HX OF   . Diverticulitis   . Dyslipidemia    a. statin intolerant.  [name] Dyspnea    with exertion  . Elevated troponin level 10/04/2015  . ENDOMETRIOSIS   . Essential hypertension   . Fibromyalgia   . GASTRIC ANTRAL VASCULAR ECTASIA   . GERD   . History of hiatal hernia   . Hypertension, uncontrolled 10/04/2015  . Insomnia   . IRON DEFICIENCY ANEMIA SECONDARY TO BLOOD LOSS   . Myocardial infarction (HCC) 08/2014  . Neuropathy   . NSTEMI (non-ST elevated  myocardial infarction) (HCC) 08/18/2014  . Osteoarthritis    "back, hips, basically all over" (05/24/2015)  . Raynaud's syndrome   . Rosacea, acne   . Sicca syndrome (HCC)   . SJOGREN'S SYNDROME   . SUPRAVENTRICULAR TACHYCARDIA   . Vitamin D deficiency     Patient Active Problem List   Diagnosis Date Noted  . C. difficile colitis - recurrent 06/23/2016  . Hypertension, uncontrolled 10/04/2015  . Essential hypertension   . CAD (coronary artery disease)   . Paresthesia 08/01/2014  . Atypical ductal hyperplasia of right breast 02/22/2014  . Dizziness and giddiness 05/24/2013  . Palpitations 03/06/2011  . SJOGREN'S SYNDROME 04/16/2009  . SUPRAVENTRICULAR TACHYCARDIA 06/24/2008  . Dyslipidemia-statin and Zetia intol 04/20/2007  . Anemia 04/20/2007  . CAD S/P unsuccessful PCI 08/18/14 04/20/2007  . Right bundle branch block 04/20/2007  . Raynaud's syndrome 04/20/2007  . GERD 04/20/2007  . GASTRIC ANTRAL VASCULAR ECTASIA 04/20/2007  . ENDOMETRIOSIS 04/20/2007  . ACNE ROSACEA 04/20/2007  . OSTEOARTHRITIS 04/20/2007  . COLONIC POLYPS, ADENOMATOUS, HX OF 04/20/2007  . HEADACHE, CHRONIC, HX OF 04/20/2007    Past Surgical History:  Procedure Laterality Date  . ABDOMINAL HYSTERECTOMY     left right ovary  . APPENDECTOMY    . BREAST EXCISIONAL BIOPSY Right   . BREAST EXCISIONAL BIOPSY Right   . BREAST LUMPECTOMY WITH RADIOACTIVE  SEED LOCALIZATION Right 01/24/2014   Procedure: BREAST LUMPECTOMY WITH RADIOACTIVE SEED LOCALIZATION;  Surgeon: Avel Peace, MD;  Location: [facility];  Service: General;  Laterality: Right;  . CARDIAC CATHETERIZATION N/A 08/18/2014   Procedure: Left Heart Cath and Coronary Angiography;  Surgeon: Marykay Lex, MD;  Location: [facility] INVASIVE CV LAB;  Service: Cardiovascular;  Laterality: N/A;  . CARDIAC CATHETERIZATION  08/18/2014   Procedure: Coronary Balloon Angioplasty;  Surgeon: Marykay Lex, MD;  Location: [facility] INVASIVE CV LAB;  Service:  Cardiovascular;;  . CARDIAC CATHETERIZATION N/A 08/21/2014   Procedure: Left Heart Cath and Coronary Angiography;  Surgeon: Marykay Lex, MD;  Location: [facility] INVASIVE CV LAB;  Service: Cardiovascular;  Laterality: N/A;  . CARDIAC CATHETERIZATION  2006  . CARDIAC CATHETERIZATION N/A 05/24/2015   Procedure: Left Heart Cath and Coronary Angiography;  Surgeon: Tonny Bollman, MD;  Location: [facility] INVASIVE CV LAB;  Service: Cardiovascular;  Laterality: N/A;  . COLONOSCOPY W/ BIOPSIES AND POLYPECTOMY  12/17/2006   adenomatous polyps, external hemorrhoids  . ESOPHAGOGASTRODUODENOSCOPY  11/27/2009   [facility] tretment of lesions  . EYE SURGERY    . HERNIA REPAIR    . HIATAL HERNIA REPAIR  2005   and paraesophageal  . LAPAROSCOPIC CHOLECYSTECTOMY    . TEAR DUCT PROBING Left    "no plugs or anything"    OB History    No data available       Home Medications    Prior to Admission medications   Medication Sig Start Date End Date Taking? Authorizing Provider  amLODipine (NORVASC) 2.5 MG tablet Take 2.5 mg by mouth daily. 10/04/15  Yes [provider]  aspirin 81 MG tablet Take 81 mg by mouth daily.   Yes [provider]  Biotin 10 MG TABS Take 10 mg by mouth daily.   Yes [provider]  Bisacodyl (DULCOLAX PO) Take 10 mg by mouth once.   Yes [provider]  calcium carbonate (ANTACID) 420 MG CHEW chewable tablet Chew 420 mg by mouth daily.   Yes [provider]  clopidogrel (PLAVIX) 75 MG tablet Take 1 tablet (75 mg total) by mouth daily with breakfast. 10/13/16  Yes Tonny Bollman, MD  cyclobenzaprine (FLEXERIL) 10 MG tablet Take 10 mg by mouth 3 (three) times daily as needed for muscle spasms.   Yes [provider]  famotidine (PEPCID) 20 MG tablet Take 1 tablet (20 mg total) by mouth daily. 06/25/16  Yes Vassie Loll, MD  feeding supplement, ENSURE ENLIVE, (ENSURE ENLIVE) LIQD Take 237 mLs by mouth 2 (two) times daily between meals. 06/24/16  Yes  Vassie Loll, MD  ferrous sulfate 325 (65 FE) MG tablet Take 325 mg by mouth daily with breakfast. Stopped 11/08/16   Yes [provider]  fluticasone (FLONASE ALLERGY RELIEF) 50 MCG/ACT nasal spray Place 1 spray into the nose daily as needed for allergies.   Yes [provider]  isosorbide mononitrate (IMDUR) 30 MG 24 hr tablet Take 1 tablet (30 mg total) by mouth daily. 11/30/15  Yes Tonny Bollman, MD  loratadine (CLARITIN) 10 MG tablet Take 10 mg by mouth daily.   Yes [provider]  metoCLOPramide (REGLAN) 10 MG tablet Take 1 tablet (10 mg total) by mouth 2 (two) times daily. 11/06/16  Yes Iva Boop, MD  metoprolol succinate (TOPROL-XL) 25 MG 24 hr tablet Take 12.5 mg by mouth daily. 03/29/16  Yes [provider]  polyethylene glycol powder (MIRALAX) powder Take 1 Container by  mouth once.   Yes [provider]  Polyvinyl Alcohol-Povidone (REFRESH OP) Place 1 drop into both eyes 2 (two) times daily.    Yes [provider]  saccharomyces boulardii (FLORASTOR) 250 MG capsule Take 250 mg by mouth 2 (two) times daily.   Yes [provider]  temazepam (RESTORIL) 15 MG capsule Take 15 mg by mouth at bedtime as needed for sleep. SLEEP AID 08/03/13  Yes [provider]  traZODone (DESYREL) 50 MG tablet Take half (1/2) to one (1) tablet (25 mg to 50 mg total) by mouth daily at bedtime as needed for sleep. 05/13/16  Yes [provider]  vancomycin (VANCOCIN HCL) 125 MG capsule Take tid x 5 days BID x 5 days QD x 5 days QOD x 5 doses Q 3 days x 5 doses 10/22/16  Yes Iva Boop, MD  Multiple Vitamins-Minerals (EMERGEN-C VITAMIN C PO) Take 1 tablet by mouth daily.    [provider]  nitroGLYCERIN (NITROSTAT) 0.4 MG SL tablet Place 1 tablet (0.4 mg total) under the tongue every 5 (five) minutes as needed for chest pain. 06/12/15   Tonny Bollman, MD    Family History Family History  Problem Relation Age of  Onset  . Heart failure Mother   . Diabetes Mother   . Kidney failure Mother   . Hypertension Mother   . Coronary artery disease Mother   . Heart disease Mother   . Hyperlipidemia Mother   . Peripheral vascular disease Mother        amputation  . Breast cancer Sister        x 3 sisters  . Cancer Sister   . Heart disease Father   . Hypertension Father   . Coronary artery disease Father   . Heart attack Father   . Coronary artery disease Brother   . Heart disease Brother   . Coronary artery disease Brother   . Coronary artery disease Brother   . Breast cancer Sister   . Breast cancer Sister   . Breast cancer Daughter   . Cancer Daughter   . Colon cancer Neg Hx   . Esophageal cancer Neg Hx   . Stomach cancer Neg Hx   . Rectal cancer Neg Hx     Social History Social History  Substance Use Topics  . Smoking status: Never Smoker  . Smokeless tobacco: Never Used  . Alcohol use No     Allergies   Ezetimibe; Ezetimibe-simvastatin; Morphine; Rosuvastatin; Ambien [zolpidem tartrate]; Plaquenil [hydroxychloroquine sulfate]; Polytrim [polymyxin b-trimethoprim]; and Prevacid [lansoprazole]   Review of Systems Review of Systems  All other systems reviewed and are negative.    Physical Exam Updated Vital Signs BP (!) 161/59   Pulse (!) 59   Temp 98.2 F (36.8 C)   Resp 18   Ht 1.524 m (5')   Wt 59 kg (130 lb)   SpO2 100%   BMI 25.39 kg/m   Physical Exam  Constitutional: She is oriented to person, place, and time. She appears well-developed and well-nourished.  Non-toxic appearance. No distress.  HENT:  Head: Normocephalic and atraumatic.  Eyes: Pupils are equal, round, and reactive to light. Conjunctivae, EOM and lids are normal.  Neck: Normal range of motion. Neck supple. No tracheal deviation present. No thyroid mass present.  Cardiovascular: Normal rate, regular rhythm and normal heart sounds.  Exam reveals no gallop.   No murmur heard. Pulmonary/Chest: Effort  normal and breath sounds normal. No stridor. No respiratory distress.  She has no decreased breath sounds. She has no wheezes. She has no rhonchi. She has no rales.  Abdominal: Soft. Normal appearance and bowel sounds are normal. She exhibits no distension. There is no tenderness. There is no rebound and no CVA tenderness.  Musculoskeletal: Normal range of motion. She exhibits no edema or tenderness.  Neurological: She is alert and oriented to person, place, and time. She has normal strength. No cranial nerve deficit or sensory deficit. GCS eye subscore is 4. GCS verbal subscore is 5. GCS motor subscore is 6.  Skin: Skin is warm and dry. No abrasion and no rash noted.  Psychiatric: She has a normal mood and affect. Her speech is normal and behavior is normal.  Nursing note and vitals reviewed.    ED Treatments / Results  Labs (all labs ordered are listed, but only abnormal results are displayed) Labs Reviewed  BASIC METABOLIC PANEL  CBC  I-STAT TROPONIN, ED    EKG  EKG Interpretation None       Radiology Dg Shoulder Left  Result Date: 11/12/2016 CLINICAL DATA:  Left shoulder pain and limited range of motion for 2 months. EXAM: LEFT SHOULDER - 2+ VIEW COMPARISON:  None. FINDINGS: Mild glenohumeral and AC joint degenerative changes but no fracture or dislocation. No destructive bony changes or abnormal soft tissue calcifications. The visualized left lung is clear and the visualized left ribs are intact. IMPRESSION: Mild degenerative changes but no acute bony findings. Electronically Signed   By: Rudie Meyer M.D.   On: 11/12/2016 17:22    Procedures Procedures (including critical care time)  Medications Ordered in ED Medications - No data to display   EKG Interpretation  Date/Time:    Ventricular Rate:    PR Interval:    QRS Duration:   QT Interval:    QTC Calculation:   R Axis:     Text Interpretation:         Initial Impression / Assessment and Plan / ED Course  I  have reviewed the triage vital signs and the nursing notes.  Pertinent labs & imaging results that were available during my care of the patient were reviewed by me and considered in my medical decision making (see chart for details).    ED ECG REPORT   Date: 11/14/2016  Rate: 75  Rhythm: normal sinus rhythm  QRS Axis: normal  Intervals: normal  ST/T Wave abnormalities: normal  Conduction Disutrbances:none  Narrative Interpretation:   Old EKG Reviewed: unchanged  I have personally reviewed the EKG tracing and agree with the computerized printout as noted.  Patient discussed with cardiology will come and see the patient decided patient's disposition.   Final Clinical Impressions(s) / ED Diagnoses   Final diagnoses:  SOB (shortness of breath)    New Prescriptions New Prescriptions   No medications on file     Lorre Nick, MD 11/14/16 1623

## 2016-11-14 NOTE — Anesthesia Preprocedure Evaluation (Deleted)
Anesthesia Evaluation  Patient identified by MRN, date of birth, ID band Patient awake    Reviewed: Allergy & Precautions, NPO status , Patient's Chart, lab work & pertinent test results  Airway Mallampati: II  TM Distance: >3 FB Neck ROM: Full    Dental  (+) Dental Advisory Given   Pulmonary shortness of breath,    breath sounds clear to auscultation       Cardiovascular hypertension, Pt. on medications and Pt. on home beta blockers + angina with exertion + CAD, + Past MI and + Peripheral Vascular Disease  + dysrhythmias  Rhythm:Regular Rate:Normal     Neuro/Psych negative neurological ROS     GI/Hepatic Neg liver ROS, hiatal hernia, GERD  ,  Endo/Other  negative endocrine ROS  Renal/GU Renal disease     Musculoskeletal  (+) Arthritis , Fibromyalgia -  Abdominal   Peds  Hematology  (+) anemia ,   Anesthesia Other Findings   Reproductive/Obstetrics                            Anesthesia Physical Anesthesia Plan  ASA: IV  Anesthesia Plan: MAC   Post-op Pain Management:    Induction: Intravenous  PONV Risk Score and Plan: 2 and Ondansetron, Propofol infusion and Treatment may vary due to age or medical condition  Airway Management Planned: Natural Airway and Simple Face Mask  Additional Equipment:   Intra-op Plan:   Post-operative Plan:   Informed Consent: I have reviewed the patients History and Physical, chart, labs and discussed the procedure including the risks, benefits and alternatives for the proposed anesthesia with the patient or authorized representative who has indicated his/her understanding and acceptance.     Plan Discussed with: CRNA  Anesthesia Plan Comments: (Pt with chest pain at rest in holding. 8/10, substernal, diaphoretic, non-radiating, with dyspnea. EKG NSR. O2 via [place] applied and SL nitro given x2 with marked improvement in CP. Case cancelled and pt  sent via rapid response to ED.)       Anesthesia Quick Evaluation

## 2016-11-14 NOTE — H&P (Signed)
[facility] History and Physical   Primary Care Physician:  Laurann Montana, MD   Reason for Procedure:   Fecal transplant  Plan:    Colonoscopy and fecal microbiotica transplant The risks and benefits as well as alternatives of endoscopic procedure(s) and fecal microbiotica transplant have been discussed and reviewed. All questions answered. The patient agrees to proceed.      HPI: [name] is a 81 y.o. female with recurrent C diff colitis - here for fecal microbiotica  Transplant due to recurrent C diff. Off vancomycin now - stools a bit loose.   Past Medical History:  Diagnosis Date  . Abnormal nuclear stress test 05/24/2015  . C. difficile colitis - recurrent 06/23/2016  . C. difficile diarrhea on po vanc 10/04/2015  . CAD (coronary artery disease) 2006, 2016   a. 08/2014 NSTEMI: Attempted PCI of 85% RPDA - unable to wire, complicated by dissection, case aborted;  b. 08/2014 Relook Cath: LM nl, LAD 60p, SP1 90ost (small), D1 65ost, RI 45/50, RCA nl, RPDA 90 - healed dissection;  b. 08/2014 Echo: EF 55%, mild/mon midapical inferior HK, mild AI, PASP . c. Cath 05/2015 with CTO of the right-PDA and unsuccessful PCI  . chronic back   . CKD (chronic kidney disease), stage II   . COLONIC POLYPS, ADENOMATOUS, HX OF   . Diverticulitis   . Dyslipidemia    a. statin intolerant.  [name] Dyspnea    with exertion  . Elevated troponin level 10/04/2015  . ENDOMETRIOSIS   . Essential hypertension   . Fibromyalgia   . GASTRIC ANTRAL VASCULAR ECTASIA   . GERD   . History of hiatal hernia   . Hypertension, uncontrolled 10/04/2015  . Insomnia   . IRON DEFICIENCY ANEMIA SECONDARY TO BLOOD LOSS   . Myocardial infarction (HCC) 08/2014  . Neuropathy   . NSTEMI (non-ST elevated myocardial infarction) (HCC) 08/18/2014  . Osteoarthritis    "back, hips, basically all over" (05/24/2015)  . Raynaud's syndrome   . Rosacea, acne   . Sicca syndrome (HCC)   . SJOGREN'S SYNDROME   .  SUPRAVENTRICULAR TACHYCARDIA   . Vitamin D deficiency     Past Surgical History:  Procedure Laterality Date  . ABDOMINAL HYSTERECTOMY     left right ovary  . APPENDECTOMY    . BREAST EXCISIONAL BIOPSY Right   . BREAST EXCISIONAL BIOPSY Right   . BREAST LUMPECTOMY WITH RADIOACTIVE SEED LOCALIZATION Right 01/24/2014   Procedure: BREAST LUMPECTOMY WITH RADIOACTIVE SEED LOCALIZATION;  Surgeon: Avel Peace, MD;  Location: [facility];  Service: General;  Laterality: Right;  . CARDIAC CATHETERIZATION N/A 08/18/2014   Procedure: Left Heart Cath and Coronary Angiography;  Surgeon: Marykay Lex, MD;  Location: [facility] INVASIVE CV LAB;  Service: Cardiovascular;  Laterality: N/A;  . CARDIAC CATHETERIZATION  08/18/2014   Procedure: Coronary Balloon Angioplasty;  Surgeon: Marykay Lex, MD;  Location: [facility] INVASIVE CV LAB;  Service: Cardiovascular;;  . CARDIAC CATHETERIZATION N/A 08/21/2014   Procedure: Left Heart Cath and Coronary Angiography;  Surgeon: Marykay Lex, MD;  Location: [facility] INVASIVE CV LAB;  Service: Cardiovascular;  Laterality: N/A;  . CARDIAC CATHETERIZATION  2006  . CARDIAC CATHETERIZATION N/A 05/24/2015   Procedure: Left Heart Cath and Coronary Angiography;  Surgeon: Tonny Bollman, MD;  Location: [facility] INVASIVE CV LAB;  Service: Cardiovascular;  Laterality: N/A;  . COLONOSCOPY W/ BIOPSIES AND POLYPECTOMY  12/17/2006   adenomatous polyps, external hemorrhoids  . ESOPHAGOGASTRODUODENOSCOPY  11/27/2009   [facility]  tretment of lesions  . EYE SURGERY    . HERNIA REPAIR    . HIATAL HERNIA REPAIR  2005   and paraesophageal  . LAPAROSCOPIC CHOLECYSTECTOMY    . TEAR DUCT PROBING Left    "no plugs or anything"    Prior to Admission medications   Medication Sig Start Date End Date Taking? Authorizing Provider  amLODipine (NORVASC) 2.5 MG tablet Take 2.5 mg by mouth daily. 10/04/15  Yes [provider]  aspirin 81 MG tablet Take 81 mg by mouth daily.   Yes [provider]  Biotin 10 MG TABS Take 10 mg by mouth daily.   Yes [provider]  Bisacodyl (DULCOLAX PO) Take 10 mg by mouth once.   Yes [provider]  calcium carbonate (ANTACID) 420 MG CHEW chewable tablet Chew 420 mg by mouth daily.   Yes [provider]  clopidogrel (PLAVIX) 75 MG tablet Take 1 tablet (75 mg total) by mouth daily with breakfast. 10/13/16  Yes Tonny Bollman, MD  cyclobenzaprine (FLEXERIL) 10 MG tablet Take 10 mg by mouth 3 (three) times daily as needed for muscle spasms.   Yes [provider]  famotidine (PEPCID) 20 MG tablet Take 1 tablet (20 mg total) by mouth daily. 06/25/16  Yes Vassie Loll, MD  feeding supplement, ENSURE ENLIVE, (ENSURE ENLIVE) LIQD Take 237 mLs by mouth 2 (two) times daily between meals. 06/24/16  Yes Vassie Loll, MD  ferrous sulfate 325 (65 FE) MG tablet Take 325 mg by mouth daily with breakfast. Stopped 11/08/16   Yes [provider]  fluticasone (FLONASE ALLERGY RELIEF) 50 MCG/ACT nasal spray Place 1 spray into the nose daily as needed for allergies.   Yes [provider]  isosorbide mononitrate (IMDUR) 30 MG 24 hr tablet Take 1 tablet (30 mg total) by mouth daily. 11/30/15  Yes Tonny Bollman, MD  loratadine (CLARITIN) 10 MG tablet Take 10 mg by mouth daily.   Yes [provider]  metoCLOPramide (REGLAN) 10 MG tablet Take 1 tablet (10 mg total) by mouth 2 (two) times daily. 11/06/16  Yes Iva Boop, MD  metoprolol succinate (TOPROL-XL) 25 MG 24 hr tablet Take 12.5 mg by mouth daily. 03/29/16  Yes [provider]  polyethylene glycol powder (MIRALAX) powder Take 1 Container by mouth once.   Yes [provider]  Polyvinyl Alcohol-Povidone (REFRESH OP) Place 1 drop into both eyes 2 (two) times daily.    Yes [provider]  saccharomyces boulardii (FLORASTOR) 250 MG capsule Take 250 mg by mouth 2 (two) times daily.   Yes [provider]  temazepam (RESTORIL)  15 MG capsule Take 15 mg by mouth at bedtime as needed for sleep. SLEEP AID 08/03/13  Yes [provider]  traZODone (DESYREL) 50 MG tablet Take half (1/2) to one (1) tablet (25 mg to 50 mg total) by mouth daily at bedtime as needed for sleep. 05/13/16  Yes [provider]  vancomycin (VANCOCIN HCL) 125 MG capsule Take tid x 5 days BID x 5 days QD x 5 days QOD x 5 doses Q 3 days x 5 doses 10/22/16  Yes Iva Boop, MD  Multiple Vitamins-Minerals (EMERGEN-C VITAMIN C PO) Take 1 tablet by mouth daily.    [provider]  nitroGLYCERIN (NITROSTAT) 0.4 MG SL tablet Place 1 tablet (0.4 mg total) under the tongue every 5 (five) minutes as needed for chest pain. 06/12/15   Tonny Bollman, MD    Current Facility-Administered  Medications  Medication Dose Route Frequency Provider Last Rate Last Dose  . 0.9 %  sodium chloride infusion   Intravenous Continuous Iva Boop, MD      . lactated ringers infusion   Intravenous Continuous Iva Boop, MD 125 mL/hr at 11/14/16 0939 1,000 mL at 11/14/16 1610    Allergies as of 11/04/2016 - Review Complete 09/12/2016  Allergen Reaction Noted  . Ezetimibe Other (See Comments) 06/24/2008  . Ezetimibe-simvastatin Other (See Comments) 06/24/2008  . Morphine Other (See Comments) 06/24/2008  . Rosuvastatin Other (See Comments) 06/24/2008  . Ambien [zolpidem tartrate] Other (See Comments) 05/20/2013  . Plaquenil [hydroxychloroquine sulfate] Other (See Comments) 05/20/2013  . Polytrim [polymyxin b-trimethoprim] Other (See Comments) 05/20/2013  . Prevacid [lansoprazole] Other (See Comments) 05/20/2013    Family History  Problem Relation Age of Onset  . Heart failure Mother   . Diabetes Mother   . Kidney failure Mother   . Hypertension Mother   . Coronary artery disease Mother   . Heart disease Mother   . Hyperlipidemia Mother   . Peripheral vascular disease Mother        amputation  . Breast cancer Sister        x 3  sisters  . Cancer Sister   . Heart disease Father   . Hypertension Father   . Coronary artery disease Father   . Heart attack Father   . Coronary artery disease Brother   . Heart disease Brother   . Coronary artery disease Brother   . Coronary artery disease Brother   . Breast cancer Sister   . Breast cancer Sister   . Breast cancer Daughter   . Cancer Daughter   . Colon cancer Neg Hx   . Esophageal cancer Neg Hx   . Stomach cancer Neg Hx   . Rectal cancer Neg Hx     Social History   Social History  . Marital status: Married    Spouse name: N/A  . Number of children: 3  . Years of education: college   Occupational History  . retired    Social History Main Topics  . Smoking status: Never Smoker  . Smokeless tobacco: Never Used  . Alcohol use No  . Drug use: No  . Sexual activity: Not Currently   Other Topics Concern  . Not on file   Social History Narrative   Patient drinks caffeine occasionally.   Patient is right handed.       Review of Systems: Positive for some fatigue and cold intolerance All other review of systems negative except as mentioned in the HPI.  Physical Exam: Vital signs in last 24 hours: Temp:  [98 F (36.7 C)] 98 F (36.7 C) (08/31 0921) Pulse Rate:  [68] 68 (08/31 0921) Resp:  [18] 18 (08/31 0921) BP: (142)/(66) 142/66 (08/31 0921) SpO2:  [100 %] 100 % (08/31 0921) Weight:  [124 lb (56.2 kg)] 124 lb (56.2 kg) (08/31 0921)   General:   Alert,  Well-developed, well-nourished, pleasant and cooperative in NAD Lungs:  Clear throughout to auscultation.   Heart:  Regular rate and rhythm; no murmurs, clicks, rubs,  or gallops. Abdomen:  Soft, nontender and nondistended. Normal bowel sounds.   Neuro/Psych:  Alert and cooperative. Normal mood and affect. A and O x 3   @Hallelujah Wysong  Sena Slate, MD, [facility] [phone] (pager) 11/14/2016 9:45 AM@

## 2016-11-14 NOTE — Progress Notes (Signed)
Patient complaining of new chest pain.  Patient states, "it feels like somebody is sitting on me".

## 2016-11-14 NOTE — H&P (Signed)
   Patient had severe substernal chest pressure like something sitting on her chest wall in admitting waiting for her procedure. Twelve-lead EKG did not show signs of acute ischemia. Blood pressure went to 200 systolic. She was nauseous but not dyspneic or sweaty. She said she had never had pain like that before. She has significant coronary artery disease that is not able to be stented. Nitroglycerin sublingual 2 were given with improvement in pain. Rapid response team arrived, the patient will be taken to the emergency department for further evaluation and treatment. Colonoscopy is canceled.

## 2016-11-14 NOTE — Progress Notes (Signed)
Patient was placed on [facility] and EKG was ordered.  Anesthesia MD was informed and at bedside as well as GI MD.  Rapid response was called. EKG showed NSR.  Patients pain was 10/10.  Patient took her sublingual nitroglycerin at 1120.  Pain was reduced to 7/10.  Patient took a second dose of her nitroglycerin at 1125.  Pain was reduced to a 2-3/10 at 1130.  Colonoscopy was cancelled.  Patient was tranported on tele to the ED with 2 RNs.  Report was given to ED RN and patients pain was stated to be 0/10.

## 2016-11-14 NOTE — ED Triage Notes (Signed)
Pt presents to the ed from Endo. Pt was there for a fecal transplant for c. Diff she has had for 2 years. The patient started having chest pain prior to the procedure that was relieved by 2 sl nitro.  Pt has an extensive cardiac history and has no pain at this time.

## 2016-11-14 NOTE — ED Notes (Signed)
Pt assisted with bed pan

## 2016-11-14 NOTE — Consult Note (Signed)
Cardiology Consultation:   Patient ID: [name]; [id number]; [birth year]   Admit date: 11/14/2016 Date of Consult: 11/14/2016  Primary Care Provider: Laurann Montana, MD Primary Cardiologist: Dr. Excell Seltzer    Patient Profile:   [name] is a 81 y.o. female with a hx of NSTEMI 2016,  who is being seen today for the evaluation of chest pain at the request of Dr. Freida Busman.  History of Present Illness:   [name]   The patient was recently seen on the office on 11/10/16 by Dr. Excell Seltzer. Per his note- The patient presented with non-ST elevation infarction in 2016. She was noted to have severe stenosis in a PDA branch and PCI was attempted. This was complicated by localized dissection. Medical therapy was pursued. However, in February 2017she developed worsening exercise tolerance and dyspnea with low-level activity. A stress test showed ischemia in the PDA territory but also in the LAD territory. The stress test was high-risk and cardiac catheterization was done. Stem is rated total occlusion of the PDA. The LAD that was felt to have nonobstructive disease. PCI was attempted but failed because of balloon could not be passed across the total occlusion. The patient has been managed medically since that time. Films were reviewed with interventional colleagues who all agree that medical therapy was the best treatment option for this patient.  At that time she had multiple complaints including chest pain and left arm pain and marked fatigue. She was under a lot of stress and her symptoms were attributed to stress. It was felt that she would not tolerated further titration of her antianginal meds.   Patient has chronic Clostridium difficile infection. She presented for fecal transplant today. While waiting she developed chest pressure without radiation. It was substernal and there were no associated symptoms. Not pleuritic or positional. She took 2 nitroglycerin and her pain resolved 2 minutes. It is  not unusual for her to have occasional chest pain at home by her report. She states if she had been at home she would not have come to the emergency room. However she was waiting for her procedure and asked that she come to the emergency room for evaluation. She typically does not have dyspnea on exertion, orthopnea, PND, pedal edema, exertional chest pain. Cardiology asked to evaluate.   Past Medical History:  Diagnosis Date  . Abnormal nuclear stress test 05/24/2015  . C. difficile colitis - recurrent 06/23/2016  . C. difficile diarrhea on po vanc 10/04/2015  . CAD (coronary artery disease) 2006, 2016   a. 08/2014 NSTEMI: Attempted PCI of 85% RPDA - unable to wire, complicated by dissection, case aborted;  b. 08/2014 Relook Cath: LM nl, LAD 60p, SP1 90ost (small), D1 65ost, RI 45/50, RCA nl, RPDA 90 - healed dissection;  b. 08/2014 Echo: EF 55%, mild/mon midapical inferior HK, mild AI, PASP . c. Cath 05/2015 with CTO of the right-PDA and unsuccessful PCI  . chronic back   . CKD (chronic kidney disease), stage II   . COLONIC POLYPS, ADENOMATOUS, HX OF   . Diverticulitis   . Dyslipidemia    a. statin intolerant.  [name] Dyspnea    with exertion  . Elevated troponin level 10/04/2015  . ENDOMETRIOSIS   . Essential hypertension   . Fibromyalgia   . GASTRIC ANTRAL VASCULAR ECTASIA   . GERD   . History of hiatal hernia   . Hypertension, uncontrolled 10/04/2015  . Insomnia   . IRON DEFICIENCY ANEMIA SECONDARY TO BLOOD LOSS   .  Myocardial infarction (HCC) 08/2014  . Neuropathy   . NSTEMI (non-ST elevated myocardial infarction) (HCC) 08/18/2014  . Osteoarthritis    "back, hips, basically all over" (05/24/2015)  . Raynaud's syndrome   . Rosacea, acne   . Sicca syndrome (HCC)   . SJOGREN'S SYNDROME   . SUPRAVENTRICULAR TACHYCARDIA   . Vitamin D deficiency     Past Surgical History:  Procedure Laterality Date  . ABDOMINAL HYSTERECTOMY     left right ovary  . APPENDECTOMY    . BREAST EXCISIONAL  BIOPSY Right   . BREAST EXCISIONAL BIOPSY Right   . BREAST LUMPECTOMY WITH RADIOACTIVE SEED LOCALIZATION Right 01/24/2014   Procedure: BREAST LUMPECTOMY WITH RADIOACTIVE SEED LOCALIZATION;  Surgeon: Avel Peace, MD;  Location: [facility];  Service: General;  Laterality: Right;  . CARDIAC CATHETERIZATION N/A 08/18/2014   Procedure: Left Heart Cath and Coronary Angiography;  Surgeon: Marykay Lex, MD;  Location: [facility] INVASIVE CV LAB;  Service: Cardiovascular;  Laterality: N/A;  . CARDIAC CATHETERIZATION  08/18/2014   Procedure: Coronary Balloon Angioplasty;  Surgeon: Marykay Lex, MD;  Location: [facility] INVASIVE CV LAB;  Service: Cardiovascular;;  . CARDIAC CATHETERIZATION N/A 08/21/2014   Procedure: Left Heart Cath and Coronary Angiography;  Surgeon: Marykay Lex, MD;  Location: [facility] INVASIVE CV LAB;  Service: Cardiovascular;  Laterality: N/A;  . CARDIAC CATHETERIZATION  2006  . CARDIAC CATHETERIZATION N/A 05/24/2015   Procedure: Left Heart Cath and Coronary Angiography;  Surgeon: Tonny Bollman, MD;  Location: [facility] INVASIVE CV LAB;  Service: Cardiovascular;  Laterality: N/A;  . COLONOSCOPY W/ BIOPSIES AND POLYPECTOMY  12/17/2006   adenomatous polyps, external hemorrhoids  . ESOPHAGOGASTRODUODENOSCOPY  11/27/2009   APC tretment of lesions  . EYE SURGERY    . HERNIA REPAIR    . HIATAL HERNIA REPAIR  2005   and paraesophageal  . LAPAROSCOPIC CHOLECYSTECTOMY    . TEAR DUCT PROBING Left    "no plugs or anything"     Home Medications:  Prior to Admission medications   Medication Sig Start Date End Date Taking? Authorizing Provider  amLODipine (NORVASC) 2.5 MG tablet Take 2.5 mg by mouth daily. 10/04/15  Yes [provider]  aspirin 81 MG tablet Take 81 mg by mouth daily.   Yes [provider]  Biotin 10 MG TABS Take 10 mg by mouth daily.   Yes [provider]  Bisacodyl (DULCOLAX PO) Take 10 mg by mouth once.   Yes [provider]  calcium  carbonate (ANTACID) 420 MG CHEW chewable tablet Chew 420 mg by mouth daily.   Yes [provider]  clopidogrel (PLAVIX) 75 MG tablet Take 1 tablet (75 mg total) by mouth daily with breakfast. 10/13/16  Yes Tonny Bollman, MD  cyclobenzaprine (FLEXERIL) 10 MG tablet Take 10 mg by mouth 3 (three) times daily as needed for muscle spasms.   Yes [provider]  famotidine (PEPCID) 20 MG tablet Take 1 tablet (20 mg total) by mouth daily. 06/25/16  Yes Vassie Loll, MD  feeding supplement, ENSURE ENLIVE, (ENSURE ENLIVE) LIQD Take 237 mLs by mouth 2 (two) times daily between meals. 06/24/16  Yes Vassie Loll, MD  ferrous sulfate 325 (65 FE) MG tablet Take 325 mg by mouth daily with breakfast. Stopped 11/08/16   Yes [provider]  fluticasone (FLONASE ALLERGY RELIEF) 50 MCG/ACT nasal spray Place 1 spray into the nose daily as needed for allergies.   Yes [provider]  isosorbide mononitrate (IMDUR) 30 MG  24 hr tablet Take 1 tablet (30 mg total) by mouth daily. 11/30/15  Yes Tonny Bollman, MD  loratadine (CLARITIN) 10 MG tablet Take 10 mg by mouth daily.   Yes [provider]  metoCLOPramide (REGLAN) 10 MG tablet Take 1 tablet (10 mg total) by mouth 2 (two) times daily. 11/06/16  Yes Iva Boop, MD  metoprolol succinate (TOPROL-XL) 25 MG 24 hr tablet Take 12.5 mg by mouth daily. 03/29/16  Yes [provider]  polyethylene glycol powder (MIRALAX) powder Take 1 Container by mouth once.   Yes [provider]  Polyvinyl Alcohol-Povidone (REFRESH OP) Place 1 drop into both eyes 2 (two) times daily.    Yes [provider]  saccharomyces boulardii (FLORASTOR) 250 MG capsule Take 250 mg by mouth 2 (two) times daily.   Yes [provider]  temazepam (RESTORIL) 15 MG capsule Take 15 mg by mouth at bedtime as needed for sleep. SLEEP AID 08/03/13  Yes [provider]  traZODone (DESYREL) 50 MG tablet Take half (1/2) to one  (1) tablet (25 mg to 50 mg total) by mouth daily at bedtime as needed for sleep. 05/13/16  Yes [provider]  vancomycin (VANCOCIN HCL) 125 MG capsule Take tid x 5 days BID x 5 days QD x 5 days QOD x 5 doses Q 3 days x 5 doses 10/22/16  Yes Iva Boop, MD  Multiple Vitamins-Minerals (EMERGEN-C VITAMIN C PO) Take 1 tablet by mouth daily.    [provider]  nitroGLYCERIN (NITROSTAT) 0.4 MG SL tablet Place 1 tablet (0.4 mg total) under the tongue every 5 (five) minutes as needed for chest pain. 06/12/15   Tonny Bollman, MD    Inpatient Medications: Scheduled Meds:  Continuous Infusions:  PRN Meds:   Allergies:    Allergies  Allergen Reactions  . Ezetimibe Other (See Comments)    Muscle pain= zetia  . Ezetimibe-Simvastatin Other (See Comments)    Muscle pain= vytorin  . Morphine Other (See Comments)    nightmares  . Rosuvastatin Other (See Comments)    Muscle pain  . Ambien [Zolpidem Tartrate] Other (See Comments)    Fell down  . Plaquenil [Hydroxychloroquine Sulfate] Other (See Comments)    Doesn't recall reaction  . Polytrim [Polymyxin B-Trimethoprim] Other (See Comments)    unknown  . Prevacid [Lansoprazole] Other (See Comments)    dizziness    Social History:   Social History   Social History  . Marital status: Married    Spouse name: N/A  . Number of children: 3  . Years of education: college   Occupational History  . retired    Social History Main Topics  . Smoking status: Never Smoker  . Smokeless tobacco: Never Used  . Alcohol use Yes     Comment: occasional wine  . Drug use: No  . Sexual activity: Not Currently   Other Topics Concern  . Not on file   Social History Narrative   Patient drinks caffeine occasionally.   Patient is right handed.       Family History:    Family History  Problem Relation Age of Onset  . Heart failure Mother   . Diabetes Mother   . Kidney failure Mother   . Hypertension Mother   .  Coronary artery disease Mother   . Heart disease Mother   . Hyperlipidemia Mother   . Peripheral vascular disease Mother        amputation  . Breast cancer Sister  x 3 sisters  . Cancer Sister   . Heart disease Father   . Hypertension Father   . Coronary artery disease Father   . Heart attack Father   . Coronary artery disease Brother   . Heart disease Brother   . Coronary artery disease Brother   . Coronary artery disease Brother   . Breast cancer Sister   . Breast cancer Sister   . Breast cancer Daughter   . Cancer Daughter   . Colon cancer Neg Hx   . Esophageal cancer Neg Hx   . Stomach cancer Neg Hx   . Rectal cancer Neg Hx      ROS:  Please see the history of present illness.  ROS  Patient describes arthralgias and chronic diarrhea/occasional hematochezia. All other ROS reviewed and negative.     Physical Exam/Data:   Vitals:   11/14/16 1415 11/14/16 1445 11/14/16 1500 11/14/16 1558  BP: (!) 155/68 135/80 (!) 148/46 (!) 148/81  Pulse: 66 66 68 62  Resp: 20 14 10 16   Temp:      TempSrc:      SpO2:    99%  Weight:      Height:       No intake or output data in the 24 hours ending 11/14/16 1636 Filed Weights   11/14/16 0921 11/14/16 1207  Weight: 56.2 kg (124 lb) 59 kg (130 lb)   Body mass index is 25.39 kg/m.  General:  Well nourished, well developed, in no acute distress HEENT: normal Lymph: no adenopathy Neck: no JVD Endocrine:  No thryomegaly Vascular: No carotid bruits; FA pulses 2+ bilaterally without bruits  Cardiac:  normal S1, S2; RRR; no murmur  Lungs:  clear to auscultation bilaterally, no wheezing, rhonchi or rales  Abd: soft, nontender, no hepatomegaly  Ext: no edema Musculoskeletal:  No deformities, BUE and BLE strength normal and equal Skin: warm and dry  Neuro:  CNs 2-12 intact, no focal abnormalities noted Psych:  Normal affect   EKG:  The EKG was personally reviewed and demonstrates:  Sinus rhythm at 63 bpm   Relevant CV  Studies:  Left Heart Cath and Coronary Angiography  05/24/2015  Conclusion   Prox RCA lesion, 25% stenosed.  Dist RCA lesion, 25% stenosed.  Prox Cx to Mid Cx lesion, 50% stenosed.  Prox LAD lesion, 50% stenosed.  Dist LAD lesion, 25% stenosed.  RPDA lesion, 100% stenosed. Post intervention, there is a 100% residual stenosis.   1. Severe single-vessel coronary artery disease with total occlusion of the right PDA, attempted PCI unsuccessful due to inability to cross lesion  2. Heavily calcified coronary arteries with mild to moderate diffuse CAD involving the LAD and left circumflex, appropriate for medical therapy  3. Normal LV function by noninvasive assessment  Recommendations: We'll keep for observation overnight, anticipate discharge home tomorrow morning. Options include ongoing medical therapy versus another attempt at CTO-PCI. Will review with interventional colleagues and consider referral to Dr. [place] or Eldridge Dace.   Myocardial perfusion study 05/17/2015 Study Highlights   The left ventricular ejection fraction is normal (55-65%).  Nuclear stress EF: 55%.  Blood pressure demonstrated a hypotensive response to exercise.  There was no ST segment deviation noted during stress.  No T wave inversion was noted during stress.  Defect 1: There is a large defect of moderate severity present in the basal inferolateral, mid inferolateral, apical inferior, apical lateral and apex location.  This is a high risk study.  Findings consistent with ischemia.  High risk stress nuclear study with ischemia in the territory of a known right coronary artery lesion, but also with ischemia in the distribution of the distal LAD artery, new when compared to study from 2015. Normal left ventricular regional and global systolic function.  ___________________________________________________   Echocardiogram 05/17/2015 Study Conclusions - Left ventricle: The cavity size was normal. Wall  thickness was   normal. Systolic function was normal. The estimated ejection   fraction was in the range of 55% to 60%. Wall motion was normal;   there were no regional wall motion abnormalities. Doppler   parameters are consistent with abnormal left ventricular   relaxation (grade 1 diastolic dysfunction). - Aortic valve: There was mild stenosis. There was mild   regurgitation. Mean gradient (S): 8 mm Hg. Peak gradient (S): 16   mm Hg. - Mitral valve: Calcified annulus. Mildly thickened leaflets . - Pulmonary arteries: PA peak pressure: 34 mm Hg (S).     Laboratory Data:  Chemistry  Recent Labs Lab 11/14/16 1234  NA 133*  K 4.5  CL 105  CO2 20*  GLUCOSE 80  BUN <5*  CREATININE 0.82  CALCIUM 9.5  GFRNONAA >60  GFRAA >60  ANIONGAP 8   Hematology  Recent Labs Lab 11/14/16 1317  WBC 6.2  RBC 4.49  HGB 12.3  HCT 38.1  MCV 84.9  MCH 27.4  MCHC 32.3  RDW 15.8*  PLT 153    Recent Labs Lab 11/14/16 1246  TROPIPOC 0.01    Radiology/Studies:  Dg Chest Portable 1 View  Result Date: 11/14/2016 CLINICAL DATA:  Chest pain. EXAM: PORTABLE CHEST 1 VIEW COMPARISON:  April 25, 2016. FINDINGS: The cardiomediastinal silhouette is normal in size. Normal pulmonary vascularity. Atherosclerotic calcification of the aortic arch. No focal consolidation, pleural effusion, or pneumothorax. No acute osseous abnormality. IMPRESSION: No active disease. Electronically Signed   By: Obie Dredge M.D.   On: 11/14/2016 13:07   Dg Shoulder Left  Result Date: 11/12/2016 CLINICAL DATA:  Left shoulder pain and limited range of motion for 2 months. EXAM: LEFT SHOULDER - 2+ VIEW COMPARISON:  None. FINDINGS: Mild glenohumeral and AC joint degenerative changes but no fracture or dislocation. No destructive bony changes or abnormal soft tissue calcifications. The visualized left lung is clear and the visualized left ribs are intact. IMPRESSION: Mild degenerative changes but no acute bony  findings. Electronically Signed   By: Rudie Meyer M.D.   On: 11/12/2016 17:22    Assessment and Plan:   1 chest pain-patient had a short episode of chest pain while waiting for her procedure today. Promptly with 2 sublingual nitroglycerin and lasted 2 minutes by her report. She states it is not unusual for her to have occasional pain at home. She does not have exertional chest pain and there is no change in her symptoms. Her electrocardiogram shows no ST changes and her troponin is normal. Patient may be discharged on present medical therapy and follow-up with Dr. Excell Seltzer as scheduled.  2 Coronary artery disease-continue aspirin, Plavix. Intolerant to statins. Continue beta blocker, nitrates and calcium blocker.  3 hypertension-blood pressure mildly elevated. This can be followed as an outpatient and medical regimen adjusted as needed.  4 chronic Clostridium difficile infection-follow-up gastroenterology.   Signed, Olga Millers, MD  11/14/2016 4:36 PM

## 2016-11-14 NOTE — Discharge Instructions (Signed)
Follow up with your cardiologist  

## 2016-11-14 NOTE — ED Notes (Signed)
Per lab cbc hemolyzed will reorder

## 2016-11-21 ENCOUNTER — Telehealth: Payer: Self-pay | Admitting: Internal Medicine

## 2016-11-21 NOTE — Telephone Encounter (Signed)
When do you want to try and reschedule?

## 2016-11-21 NOTE — Telephone Encounter (Signed)
Phone busy.  I will continue to try and reach her

## 2016-11-21 NOTE — Telephone Encounter (Signed)
I have to look at my schedule and see - nothing easily open until October but might find something in next 2 weeks.  How is she?

## 2016-11-24 NOTE — Telephone Encounter (Signed)
Patient reports that she is still having semiformed stools, " not as bad as it was.  She is only having one BM a day. It depends on what I eat, if I eat badly I have a dad day. No more chest pain.

## 2016-12-02 NOTE — Telephone Encounter (Signed)
Left message for patient to call back  

## 2016-12-02 NOTE — Telephone Encounter (Signed)
I don't think I can do it until hospital week in October - would set up at [facility] that week if she can do it that week  Not Monday   Let me know please and tell her I hope son not stuck in [place] and ok

## 2016-12-04 NOTE — Telephone Encounter (Signed)
Left message for patient to call back  

## 2016-12-05 NOTE — Telephone Encounter (Signed)
Patient reports that she has some loose stool, but no longer every time she eats. She wants to hold off unless you feel very strongly about it. "doing a little better".

## 2016-12-05 NOTE — Telephone Encounter (Signed)
It's completely up to her  She may not get C diff again and I hope not

## 2017-01-29 ENCOUNTER — Encounter (INDEPENDENT_AMBULATORY_CARE_PROVIDER_SITE_OTHER): Payer: Self-pay

## 2017-01-29 ENCOUNTER — Encounter: Payer: Self-pay | Admitting: Cardiovascular Disease

## 2017-01-29 ENCOUNTER — Ambulatory Visit: Payer: Medicare Other | Admitting: Cardiovascular Disease

## 2017-01-29 VITALS — BP 136/68 | HR 68 | Ht 62.0 in | Wt 124.1 lb

## 2017-01-29 DIAGNOSIS — I25119 Atherosclerotic heart disease of native coronary artery with unspecified angina pectoris: Secondary | ICD-10-CM

## 2017-01-29 DIAGNOSIS — R0989 Other specified symptoms and signs involving the circulatory and respiratory systems: Secondary | ICD-10-CM

## 2017-01-29 NOTE — Patient Instructions (Signed)
Medication Instructions:  Start taking your amlodipine and Toprol at nighttime.   Labwork: None  Testing/Procedures: Your physician has requested that you have a carotid duplex. This test is an ultrasound of the carotid arteries in your neck. It looks at blood flow through these arteries that supply the brain with blood. Allow one hour for this exam. There are no restrictions or special instructions.  Follow-Up: Your provider wants you to follow-up in: 6 months with Dr. Excell Seltzer. You will receive a reminder letter in the mail two months in advance. If you don't receive a letter, please call our office to schedule the follow-up appointment.    Any Other Special Instructions Will Be Listed Below (If Applicable).     If you need a refill on your cardiac medications before your next appointment, please call your pharmacy.

## 2017-01-29 NOTE — Progress Notes (Signed)
Cardiology Office Note Date:  02/01/2017   ID:  [name], DOB 10/02/1935, MRN [id number]  PCP:  Laurann Montana, MD  Cardiologist:  Audelia Hives, MD    Chief Complaint  Patient presents with  . Fatigue     History of Present Illness: [name] is a 81 y.o. female who presents for follow-up of coronary artery disease.  The patient has chronic occlusion of the right PDA.  PCI has been attempted but is unsuccessful.  She has been managed medically as the vessel is fairly small in caliber.  She also has moderate LAD stenosis.  She is here alone today. Complaining of dizziness.  Also complains of fatigue, gait instability, and shortness of breath with activity.  All of this is essentially unchanged in these are chronic problems.  She denies orthopnea or PND.  Complains of mild leg swelling.  No recent chest pain.  She plans to move to [place] where her son has relocated. She's been concerned about living in a 2 story home and feels more unsteady with going up and down stairs.   Past Medical History:  Diagnosis Date  . Abnormal nuclear stress test 05/24/2015  . C. difficile colitis - recurrent 06/23/2016  . C. difficile diarrhea on po vanc 10/04/2015  . CAD (coronary artery disease) 2006, 2016   a. 08/2014 NSTEMI: Attempted PCI of 85% RPDA - unable to wire, complicated by dissection, case aborted;  b. 08/2014 Relook Cath: LM nl, LAD 60p, SP1 90ost (small), D1 65ost, RI 45/50, RCA nl, RPDA 90 - healed dissection;  b. 08/2014 Echo: EF 55%, mild/mon midapical inferior HK, mild AI, PASP . c. Cath 05/2015 with CTO of the right-PDA and unsuccessful PCI  . chronic back   . CKD (chronic kidney disease), stage II   . COLONIC POLYPS, ADENOMATOUS, HX OF   . Diverticulitis   . Dyslipidemia    a. statin intolerant.  [name] Dyspnea    with exertion  . Elevated troponin level 10/04/2015  . ENDOMETRIOSIS   . Essential hypertension   . Fibromyalgia   . GASTRIC ANTRAL VASCULAR ECTASIA   .  GERD   . History of hiatal hernia   . Hypertension, uncontrolled 10/04/2015  . Insomnia   . IRON DEFICIENCY ANEMIA SECONDARY TO BLOOD LOSS   . Myocardial infarction (HCC) 08/2014  . Neuropathy   . NSTEMI (non-ST elevated myocardial infarction) (HCC) 08/18/2014  . Osteoarthritis    "back, hips, basically all over" (05/24/2015)  . Raynaud's syndrome   . Rosacea, acne   . Sicca syndrome (HCC)   . SJOGREN'S SYNDROME   . SUPRAVENTRICULAR TACHYCARDIA   . Vitamin D deficiency     Past Surgical History:  Procedure Laterality Date  . ABDOMINAL HYSTERECTOMY     left right ovary  . APPENDECTOMY    . BREAST EXCISIONAL BIOPSY Right   . BREAST EXCISIONAL BIOPSY Right   . BREAST LUMPECTOMY WITH RADIOACTIVE SEED LOCALIZATION Right 01/24/2014   Performed by Avel Peace, MD at [facility]  . CANCELLED PROCEDURE, chest pain to be admitted  11/14/2016   Performed by Iva Boop, MD at [facility] ENDOSCOPY  . CARDIAC CATHETERIZATION  2006  . COLONOSCOPY W/ BIOPSIES AND POLYPECTOMY  12/17/2006   adenomatous polyps, external hemorrhoids  . Coronary Balloon Angioplasty  08/18/2014   Performed by Marykay Lex, MD at [facility] INVASIVE CV LAB  . ESOPHAGOGASTRODUODENOSCOPY  11/27/2009   APC tretment of lesions  . EYE SURGERY    .  HERNIA REPAIR    . HIATAL HERNIA REPAIR  2005   and paraesophageal  . LAPAROSCOPIC CHOLECYSTECTOMY    . Left Heart Cath and Coronary Angiography N/A 05/24/2015   Performed by Tonny Bollman, MD at [facility] INVASIVE CV LAB  . Left Heart Cath and Coronary Angiography N/A 08/21/2014   Performed by Marykay Lex, MD at [facility] INVASIVE CV LAB  . Left Heart Cath and Coronary Angiography N/A 08/18/2014   Performed by Marykay Lex, MD at [facility] INVASIVE CV LAB  . TEAR DUCT PROBING Left    "no plugs or anything"    Current Outpatient Medications  Medication Sig Dispense Refill  . amLODipine (NORVASC) 2.5 MG tablet Take 2.5 mg by mouth daily.    [name] aspirin 81 MG tablet Take 81 mg by  mouth daily.    . Biotin 10 MG TABS Take 10 mg by mouth daily.    . calcium carbonate (ANTACID) 420 MG CHEW chewable tablet Chew 420 mg by mouth daily.    . clopidogrel (PLAVIX) 75 MG tablet Take 1 tablet (75 mg total) by mouth daily with breakfast. 90 tablet 2  . cyclobenzaprine (FLEXERIL) 10 MG tablet Take 5-10 mg 3 (three) times daily as needed by mouth for muscle spasms.     . famotidine (PEPCID) 20 MG tablet Take 1 tablet (20 mg total) by mouth daily. 30 tablet 1  . feeding supplement, ENSURE ENLIVE, (ENSURE ENLIVE) LIQD Take 237 mLs by mouth 2 (two) times daily between meals.    . ferrous sulfate 325 (65 FE) MG tablet Take 325 mg by mouth daily with breakfast. Stopped 11/08/16    . fluticasone (FLONASE ALLERGY RELIEF) 50 MCG/ACT nasal spray Place 1 spray into the nose daily as needed for allergies.    . furosemide (LASIX) 20 MG tablet Take 20 mg daily as needed by mouth (swelling).    . isosorbide mononitrate (IMDUR) 30 MG 24 hr tablet Take 1 tablet (30 mg total) by mouth daily. 90 tablet 3  . loratadine (CLARITIN) 10 MG tablet Take 10 mg by mouth daily.    . metoCLOPramide (REGLAN) 10 MG tablet Take 1 tablet (10 mg total) by mouth 2 (two) times daily. 2 tablet 0  . metoprolol succinate (TOPROL-XL) 25 MG 24 hr tablet Take 12.5 mg by mouth daily.    . Multiple Vitamins-Minerals (EMERGEN-C VITAMIN C PO) Take 1 tablet by mouth daily.    . nitroGLYCERIN (NITROSTAT) 0.4 MG SL tablet Place 1 tablet (0.4 mg total) under the tongue every 5 (five) minutes as needed for chest pain. 25 tablet 1  . polyethylene glycol powder (MIRALAX) powder Take 1 Container by mouth once.    . Polyvinyl Alcohol-Povidone (REFRESH OP) Place 1 drop into both eyes 2 (two) times daily.     [name] saccharomyces boulardii (FLORASTOR) 250 MG capsule Take 250 mg by mouth 2 (two) times daily.    . temazepam (RESTORIL) 15 MG capsule Take 15 mg by mouth at bedtime as needed for sleep. SLEEP AID    . traZODone (DESYREL) 50 MG tablet  Take half (1/2) to one (1) tablet (25 mg to 50 mg total) by mouth daily at bedtime as needed for sleep.    . vancomycin (VANCOCIN HCL) 125 MG capsule Take tid x 5 days BID x 5 days QD x 5 days QOD x 5 doses Q 3 days x 5 doses (Patient not taking: Reported on 01/30/2017) 40 capsule 0   No current facility-administered medications for this  visit.     Allergies:   Ezetimibe; Ezetimibe-simvastatin; Morphine; Rosuvastatin; Ambien [zolpidem tartrate]; Plaquenil [hydroxychloroquine sulfate]; Polytrim [polymyxin b-trimethoprim]; and Prevacid [lansoprazole]   Social History:  The patient  reports that  has never smoked. she has never used smokeless tobacco. She reports that she drinks alcohol. She reports that she does not use drugs.   Family History:  The patient's family history includes Breast cancer in her daughter, sister, sister, and sister; Cancer in her daughter and sister; Coronary artery disease in her brother, brother, brother, father, and mother; Diabetes in her mother; Heart attack in her father; Heart disease in her brother, father, and mother; Heart failure in her mother; Hyperlipidemia in her mother; Hypertension in her father and mother; Kidney failure in her mother; Peripheral vascular disease in her mother.    ROS:  Please see the history of present illness.  Otherwise, review of systems is positive for leg swelling, hearing loss, visual disturbance, diarrhea, back pain, dizziness, fatigue, palpitations, balance problems.  All other systems are reviewed and negative.    PHYSICAL EXAM: VS:  BP 136/68   Pulse 68   Ht 5\' 2"  (1.575 m)   Wt 124 lb 1.9 oz (56.3 kg)   BMI 22.70 kg/m  , BMI Body mass index is 22.7 kg/m. GEN: Well nourished, well developed, in no acute distress  HEENT: normal  Neck: no JVD, no masses. Right carotid bruits Cardiac: RRR with 2/6 early peaking systolic murmur at the RUSB               Respiratory:  clear to auscultation bilaterally, normal work of  breathing GI: soft, nontender, nondistended, + BS MS: no deformity or atrophy  Ext: no pretibial edema, pedal pulses 2+= bilaterally Skin: warm and dry, no rash Neuro:  Strength and sensation are intact Psych: euthymic mood, full affect  EKG:  EKG is not ordered today.  Recent Labs: 01/30/2017: ALT 13; BUN 11; Creatinine, Ser 0.90; Hemoglobin 12.6; Platelets 197; Potassium 4.4; Sodium 131   Lipid Panel     Component Value Date/Time   CHOL 162 08/19/2014 0255   TRIG 98 08/19/2014 0255   HDL 54 08/19/2014 0255   CHOLHDL 3.0 08/19/2014 0255   VLDL 20 08/19/2014 0255   LDLCALC 88 08/19/2014 0255      Wt Readings from Last 3 Encounters:  01/30/17 124 lb (56.2 kg)  01/29/17 124 lb 1.9 oz (56.3 kg)  11/14/16 130 lb (59 kg)     Cardiac Studies Reviewed: Cardiac Cath 05-24-2015: Procedures   Left Heart Cath and Coronary Angiography  Conclusion    Prox RCA lesion, 25% stenosed.  Dist RCA lesion, 25% stenosed.  Prox Cx to Mid Cx lesion, 50% stenosed.  Prox LAD lesion, 50% stenosed.  Dist LAD lesion, 25% stenosed.  RPDA lesion, 100% stenosed. Post intervention, there is a 100% residual stenosis.   1. Severe single-vessel coronary artery disease with total occlusion of the right PDA, attempted PCI unsuccessful due to inability to cross lesion  2. Heavily calcified coronary arteries with mild to moderate diffuse CAD involving the LAD and left circumflex, appropriate for medical therapy  3. Normal LV function by noninvasive assessment  Recommendations: We'll keep for observation overnight, anticipate discharge home tomorrow morning. Options include ongoing medical therapy versus another attempt at CTO-PCI. Will review with interventional colleagues and consider referral to Dr. [place] or Eldridge Dace.   Indications   Abnormal nuclear stress test [R94.39 (ICD-10-CM)]  Complications   Complications documented in old activity  INDICATION: Abnormal nuclear stress test,  progressive angina   PROCEDURAL DETAILS:  The right groin was prepped, draped, and anesthetized with 1% lidocaine. Using modified Seldinger technique, a 5 French sheath was introduced into the right femoral artery. Standard Judkins catheters were used for coronary angiography. Left ventricular pressure was recorded with a pigtail catheter. Ventriculography was deferred as she had recent noninvasive assessment with normal LV function. PCI was attempted after the diagnostic procedure. See note for details. Catheter exchanges were performed over an 0.035 guidewire. A Perclose devices used for femoral hemostasis. There were no immediate procedural complications. The patient was transferred to the post catheterization recovery area for further monitoring.   During this procedure the patient is administered a total of Versed 3 mg and Fentanyl 50 mg to achieve and maintain moderate conscious sedation. The patient's heart rate, blood pressure, and oxygen saturation are monitored continuously during the procedure. The period of conscious sedation is 60 minutes, of which I was present face-to-face 100% of this time.   Estimated blood loss <50 mL. There were no immediate complications during the procedure.      Coronary Findings   Diagnostic  Dominance: Right  Left Anterior Descending  Prox LAD lesion 50% stenosed  Calcified. There is a heavily calcified lesion in the proximal LAD at the origin of the first diagonal. There is no significant change from the previous study last year. This does not appear to be flow obstructive and I would estimated at 50%. The remaining portions of the LAD are heavily calcified with mild nonobstructive disease noted.  Dist LAD lesion 25% stenosed  Calcified diffuse.  Left Circumflex  Prox Cx to Mid Cx lesion 50% stenosed  Diffuse. There is diffuse, calcified nonobstructive stenosis in the left circumflex up to 50%  Right Coronary Artery  This is a large, dominant  vessel. The entire vessel is heavily calcified. There are no high-grade lesions throughout the proximal, mid, or distal RCA. The PLA branches are patent, but the PDA branch is totally occluded.  Prox RCA lesion 25% stenosed  Calcified diffuse.  Dist RCA lesion 25% stenosed  Calcified diffuse.  Right Posterior Descending Artery  RPDA lesion 100% stenosed  Calcified.  Intervention   RPDA lesion  PCI  There is no pre-interventional antegrade distal flow (TIMI 0). A stent was not placed. There is no post-interventional antegrade distal flow (TIMI 0). The intervention was unsuccessful due to an inability to cross the lesion. No complications occurred at this lesion. The right PDA is totally occluded. The occlusion is at the previous site of a tight lesion and wire dissection that occurred one year ago. There is a left to right collateral that fills this vessel. The PDA is not large in caliber but it does supply large territory which includes the inferior wall and this vessel reaches the apex of the heart. Angiomax is used for anticoagulation. A JR4 guide catheter was used and it provides good backup support. I tried to use a whisper wire and was able to navigate the right coronary artery into the PDA. However, the whisper wire would not cross the lesion. I used a 1.5 mm balloon for backup support in the whisper would still not cross. This was changed out for a miracle Brothers 3 g wire but I was unable to bring the 1.5 mm balloon all the way up to the lesion. This was then changed out over the 3 g miracle Brothers wire for a fine cross. The fine cross was taken  right up to the occlusion and I was able to advance the miracle Brothers wire across the occlusion. After a fairly prolonged effort, the fine cross advanced across the lesion as well. It tracked well with the native PDA. I was able to aspirate a small amount of blood from a 3 mL syringe and a small amount of contrast was injected through the fine  cross. This demonstrated that the fine cross was located in the subintimal space. At that point the fine cross was slowly withdrawn and further injection again confirmed it was still in the subintimal space. I felt like the likelihood of successful reentry was extremely small at that point in the procedure was aborted. There were no procedural complications. The patient tolerated the procedure well.  There is a 100% residual stenosis post intervention.  Coronary Diagrams   Diagnostic Diagram       Post-Intervention Diagram        Echo 05-17-2015: Study Conclusions  - Left ventricle: The cavity size was normal. Wall thickness was   normal. Systolic function was normal. The estimated ejection   fraction was in the range of 55% to 60%. Wall motion was normal;   there were no regional wall motion abnormalities. Doppler   parameters are consistent with abnormal left ventricular   relaxation (grade 1 diastolic dysfunction). - Aortic valve: There was mild stenosis. There was mild   regurgitation. Mean gradient (S): 8 mm Hg. Peak gradient (S): 16   mm Hg. - Mitral valve: Calcified annulus. Mildly thickened leaflets . - Pulmonary arteries: PA peak pressure: 34 mm Hg (S).  ASSESSMENT AND PLAN: 1.  CAD, native vessel, with angina: continues on multi-drug Rx.  Overall appears stable with no recent chest pain on her current medicines.  We discussed altering her medications so that her antianginal/antihypertensive medicines are split between morning and evening to try to minimize fatigue.  She will take amlodipine and metoprolol succinate at bedtime.  She will continue on isosorbide in the morning.  She remains on dual antiplatelet therapy with aspirin and clopidogrel.  2. HTN: BP well-controlled on current medicines  3. Mild aortic stenosis: continue clinical follow-up. Most recent echo reviewed.   4. Dyslipidemia: unable to tolerate any lipid-lowering drugs  5.  Right carotid bruit: Recommend  carotid duplex exam.  Current medicines are reviewed with the patient today.  The patient does not have concerns regarding medicines.  Labs/ tests ordered today include:  No orders of the defined types were placed in this encounter.   Disposition:   FU 6 months  Signed, Audelia Hives, MD  02/01/2017 9:48 AM    [facility] [address] [place], [place], [place]  [address] Phone: [phone]; Fax: [phone]

## 2017-01-30 ENCOUNTER — Other Ambulatory Visit: Payer: Self-pay

## 2017-01-30 ENCOUNTER — Emergency Department (HOSPITAL_COMMUNITY): Payer: Medicare Other

## 2017-01-30 ENCOUNTER — Encounter (HOSPITAL_COMMUNITY): Payer: Self-pay

## 2017-01-30 ENCOUNTER — Encounter: Payer: Self-pay | Admitting: Cardiovascular Disease

## 2017-01-30 ENCOUNTER — Emergency Department (HOSPITAL_COMMUNITY)
Admission: EM | Admit: 2017-01-30 | Discharge: 2017-01-30 | Disposition: A | Discharge status: Home / Self Care | Payer: Medicare Other | Attending: Emergency Medicine | Admitting: Emergency Medicine

## 2017-01-30 DIAGNOSIS — N182 Chronic kidney disease, stage 2 (mild): Secondary | ICD-10-CM | POA: Diagnosis not present

## 2017-01-30 DIAGNOSIS — I252 Old myocardial infarction: Secondary | ICD-10-CM | POA: Diagnosis not present

## 2017-01-30 DIAGNOSIS — I129 Hypertensive chronic kidney disease with stage 1 through stage 4 chronic kidney disease, or unspecified chronic kidney disease: Secondary | ICD-10-CM | POA: Diagnosis not present

## 2017-01-30 DIAGNOSIS — Z862 Personal history of diseases of the blood and blood-forming organs and certain disorders involving the immune mechanism: Secondary | ICD-10-CM | POA: Diagnosis not present

## 2017-01-30 DIAGNOSIS — R201 Hypoesthesia of skin: Secondary | ICD-10-CM | POA: Diagnosis not present

## 2017-01-30 DIAGNOSIS — Z7982 Long term (current) use of aspirin: Secondary | ICD-10-CM | POA: Insufficient documentation

## 2017-01-30 DIAGNOSIS — Z79899 Other long term (current) drug therapy: Secondary | ICD-10-CM | POA: Insufficient documentation

## 2017-01-30 DIAGNOSIS — Z7902 Long term (current) use of antithrombotics/antiplatelets: Secondary | ICD-10-CM | POA: Insufficient documentation

## 2017-01-30 DIAGNOSIS — R200 Anesthesia of skin: Secondary | ICD-10-CM

## 2017-01-30 DIAGNOSIS — I2510 Atherosclerotic heart disease of native coronary artery without angina pectoris: Secondary | ICD-10-CM | POA: Diagnosis not present

## 2017-01-30 LAB — CBC
HEMATOCRIT: 35.3 % — AB (ref 36.0–46.0)
Hemoglobin: 11.5 g/dL — ABNORMAL LOW (ref 12.0–15.0)
MCH: 27.8 pg (ref 26.0–34.0)
MCHC: 32.6 g/dL (ref 30.0–36.0)
MCV: 85.5 fL (ref 78.0–100.0)
PLATELETS: 197 10*3/uL (ref 150–400)
RBC: 4.13 MIL/uL (ref 3.87–5.11)
RDW: 15.3 % (ref 11.5–15.5)
WBC: 6.8 10*3/uL (ref 4.0–10.5)

## 2017-01-30 LAB — RAPID URINE DRUG SCREEN, HOSP PERFORMED
Amphetamines: NOT DETECTED
BENZODIAZEPINES: NOT DETECTED
Barbiturates: NOT DETECTED
COCAINE: NOT DETECTED
OPIATES: NOT DETECTED
Tetrahydrocannabinol: NOT DETECTED

## 2017-01-30 LAB — I-STAT TROPONIN, ED
TROPONIN I, POC: 0 ng/mL (ref 0.00–0.08)
TROPONIN I, POC: 0.01 ng/mL (ref 0.00–0.08)

## 2017-01-30 LAB — I-STAT CHEM 8, ED
BUN: 11 mg/dL (ref 6–20)
CALCIUM ION: 1.21 mmol/L (ref 1.15–1.40)
CHLORIDE: 100 mmol/L — AB (ref 101–111)
Creatinine, Ser: 0.9 mg/dL (ref 0.44–1.00)
GLUCOSE: 85 mg/dL (ref 65–99)
HCT: 37 % (ref 36.0–46.0)
HEMOGLOBIN: 12.6 g/dL (ref 12.0–15.0)
POTASSIUM: 4.4 mmol/L (ref 3.5–5.1)
Sodium: 131 mmol/L — ABNORMAL LOW (ref 135–145)
TCO2: 22 mmol/L (ref 22–32)

## 2017-01-30 LAB — ETHANOL

## 2017-01-30 LAB — COMPREHENSIVE METABOLIC PANEL
ALT: 13 U/L — ABNORMAL LOW (ref 14–54)
ANION GAP: 6 (ref 5–15)
AST: 24 U/L (ref 15–41)
Albumin: 3.7 g/dL (ref 3.5–5.0)
Alkaline Phosphatase: 71 U/L (ref 38–126)
BUN: 9 mg/dL (ref 6–20)
CHLORIDE: 101 mmol/L (ref 101–111)
CO2: 23 mmol/L (ref 22–32)
CREATININE: 0.91 mg/dL (ref 0.44–1.00)
Calcium: 9.3 mg/dL (ref 8.9–10.3)
GFR, EST NON AFRICAN AMERICAN: 59 mL/min — AB (ref >60)
Glucose, Bld: 86 mg/dL (ref 65–99)
POTASSIUM: 4.3 mmol/L (ref 3.5–5.1)
SODIUM: 130 mmol/L — AB (ref 135–145)
Total Bilirubin: 0.8 mg/dL (ref 0.3–1.2)
Total Protein: 6.1 g/dL — ABNORMAL LOW (ref 6.5–8.1)

## 2017-01-30 LAB — URINALYSIS, ROUTINE W REFLEX MICROSCOPIC
BACTERIA UA: NONE SEEN
BILIRUBIN URINE: NEGATIVE
Glucose, UA: NEGATIVE mg/dL
HGB URINE DIPSTICK: NEGATIVE
KETONES UR: NEGATIVE mg/dL
Nitrite: NEGATIVE
Protein, ur: NEGATIVE mg/dL
SPECIFIC GRAVITY, URINE: 1.012 (ref 1.005–1.030)
pH: 5 (ref 5.0–8.0)

## 2017-01-30 LAB — DIFFERENTIAL
BASOS PCT: 0 %
Basophils Absolute: 0 10*3/uL (ref 0.0–0.1)
EOS ABS: 0 10*3/uL (ref 0.0–0.7)
Eosinophils Relative: 0 %
Lymphocytes Relative: 21 %
Lymphs Abs: 1.4 10*3/uL (ref 0.7–4.0)
MONO ABS: 0.6 10*3/uL (ref 0.1–1.0)
MONOS PCT: 8 %
Neutro Abs: 4.8 10*3/uL (ref 1.7–7.7)
Neutrophils Relative %: 71 %

## 2017-01-30 LAB — PROTIME-INR
INR: 0.99
PROTHROMBIN TIME: 13 s (ref 11.4–15.2)

## 2017-01-30 LAB — APTT: APTT: 34 s (ref 24–36)

## 2017-01-30 MED ORDER — LORAZEPAM 2 MG/ML IJ SOLN
0.5000 mg | Freq: Once | INTRAMUSCULAR | Status: AC
  Administered 2017-01-30: 0.5 mg via INTRAVENOUS
  Filled 2017-01-30: qty 1

## 2017-01-30 NOTE — Consult Note (Signed)
Neurology Consultation Reason for Consult: Left-sided numbness Referring Physician: Anitra LauthPlunkett, W  CC: Sided numbness  History is obtained from: Patient  HPI: [name] is a 81 y.o. female with a history of heart disease who was at her cardiologist's office today when she developed sudden onset left-sided hypoesthesia.  Due to this, she was brought into the emergency department.  She describes that she has numbness of her left face, arm, leg.  This started acutely at 10:30 AM.   LKW: 10:30 AM tpa given?: no, mild symptoms  ROS: A 14 point ROS was performed and is negative except as noted in the HPI.   Past Medical History:  Diagnosis Date  . Abnormal nuclear stress test 05/24/2015  . C. difficile colitis - recurrent 06/23/2016  . C. difficile diarrhea on po vanc 10/04/2015  . CAD (coronary artery disease) 2006, 2016   a. 08/2014 NSTEMI: Attempted PCI of 85% RPDA - unable to wire, complicated by dissection, case aborted;  b. 08/2014 Relook Cath: LM nl, LAD 60p, SP1 90ost (small), D1 65ost, RI 45/50, RCA nl, RPDA 90 - healed dissection;  b. 08/2014 Echo: EF 55%, mild/mon midapical inferior HK, mild AI, PASP 43mmHg. c. Cath 05/2015 with CTO of the right-PDA and unsuccessful PCI  . chronic back   . CKD (chronic kidney disease), stage II   . COLONIC POLYPS, ADENOMATOUS, HX OF   . Diverticulitis   . Dyslipidemia    a. statin intolerant.  [name]. Dyspnea    with exertion  . Elevated troponin level 10/04/2015  . ENDOMETRIOSIS   . Essential hypertension   . Fibromyalgia   . GASTRIC ANTRAL VASCULAR ECTASIA   . GERD   . History of hiatal hernia   . Hypertension, uncontrolled 10/04/2015  . Insomnia   . IRON DEFICIENCY ANEMIA SECONDARY TO BLOOD LOSS   . Myocardial infarction (HCC) 08/2014  . Neuropathy   . NSTEMI (non-ST elevated myocardial infarction) (HCC) 08/18/2014  . Osteoarthritis    "back, hips, basically all over" (05/24/2015)  . Raynaud's syndrome   . Rosacea, acne   . Sicca syndrome (HCC)    . SJOGREN'S SYNDROME   . SUPRAVENTRICULAR TACHYCARDIA   . Vitamin D deficiency      Family History  Problem Relation Age of Onset  . Heart failure Mother   . Diabetes Mother   . Kidney failure Mother   . Hypertension Mother   . Coronary artery disease Mother   . Heart disease Mother   . Hyperlipidemia Mother   . Peripheral vascular disease Mother        amputation  . Breast cancer Sister        x 3 sisters  . Cancer Sister   . Heart disease Father   . Hypertension Father   . Coronary artery disease Father   . Heart attack Father   . Coronary artery disease Brother   . Heart disease Brother   . Coronary artery disease Brother   . Coronary artery disease Brother   . Breast cancer Sister   . Breast cancer Sister   . Breast cancer Daughter   . Cancer Daughter   . Colon cancer Neg Hx   . Esophageal cancer Neg Hx   . Stomach cancer Neg Hx   . Rectal cancer Neg Hx      Social History:  reports that  has never smoked. she has never used smokeless tobacco. She reports that she drinks alcohol. She reports that she does not use drugs.  Exam: Current vital signs: BP (!) 180/79   Pulse 89   Temp 98.1 F (36.7 C)   Resp 15   Ht 5\' 2"  (1.575 m)   Wt 56.2 kg (124 lb)   SpO2 100%   BMI 22.68 kg/m  Vital signs in last 24 hours: Temp:  [98.1 F (36.7 C)-98.2 F (36.8 C)] 98.1 F (36.7 C) (11/16 1433) Pulse Rate:  [59-89] 89 (11/16 1600) Resp:  [10-17] 15 (11/16 1600) BP: (134-180)/(58-79) 180/79 (11/16 1600) SpO2:  [95 %-100 %] 100 % (11/16 1600) Weight:  [56.2 kg (124 lb)] 56.2 kg (124 lb) (11/16 1323)   Physical Exam  Constitutional: Appears well-developed and well-nourished.  Psych: Affect appropriate to situation Eyes: No scleral injection HENT: No OP obstrucion Head: Normocephalic.  Cardiovascular: Normal rate and regular rhythm.  Respiratory: Effort normal and breath sounds normal to anterior ascultation GI: Soft.  No distension. There is no tenderness.   Skin: WDI  Neuro: Mental Status: Patient is awake, alert, oriented to person, place, month, year, and situation. Patient is able to give a clear and coherent history. No signs of aphasia or neglect Cranial Nerves: II: Visual Fields are full. Pupils are equal, round, and reactive to light.   III,IV, VI: EOMI without ptosis or diploplia.  V: Facial sensation is decreased on the left, she splits midline to pinprick but not vibration VII: Facial movement is symmetric.  VIII: hearing is intact to voice X: Uvula elevates symmetrically XI: Shoulder shrug is symmetric. XII: tongue is midline without atrophy or fasciculations.  Motor: Tone is normal. Bulk is normal. 5/5 strength was present in all four extremities.  Sensory: Sensation is decreased on the left  Cerebellar: FNF and HKS are intact bilaterally   I have reviewed labs in epic and the results pertinent to this consultation are: Mild hyponatremia   Impression: 81 year old female with left-sided hypoesthesia.  She does split the midline to pinprick which does raise the possibility of embellishment, but without splitting the midline to vibration, I do not feel this is definitive.  She will need an MRI, however if this is negative then I do not feel that I would strongly pursue this.  She is already on dual antiplatelet therapy for her heart disease.  She is statin intolerant.  Recommendations: 1) MRI brain, stroke workup if positive.   Ritta Slot, MD [facility] [phone]  If 7pm- 7am, please page neurology on call as listed in AMION.

## 2017-01-30 NOTE — ED Notes (Signed)
Pt left for MRI.

## 2017-01-30 NOTE — ED Notes (Signed)
Pt was able to ambulate to restroom with one assist, tolerated well.

## 2017-01-30 NOTE — ED Notes (Signed)
Pt back in room from CT. Waiting for MRI.

## 2017-01-30 NOTE — ED Provider Notes (Signed)
[facility] EMERGENCY DEPARTMENT Provider Note   CSN: [id number] Arrival date & time: 01/30/17  1258     History   Chief Complaint Chief Complaint  Patient presents with  . Arm Pain    HPI [name] is a 81 y.o. female.  Patient is a 81 year old female with a history of coronary artery disease status post catheterization most recently in 2016 that was not amenable to stents, chronic kidney disease, ray nods, N STEMI who is presenting from her PCP office today with numbness and heaviness in the left arm.  This started approximately at 1030 today.  Any chest pain shortness of breath, nausea or vomiting with the symptoms.  However she is also noticed some of the same symptoms in her left leg.  She denies any vision issues, speech or swallowing difficulty.  She has never had symptoms like this before.  She denies any recent trauma or neck manipulation.  However she is also having some mild pain in her neck.   The history is provided by the patient.  Arm Pain  This is a new problem. Episode onset: 3 hours ago. The problem occurs constantly. The problem has not changed since onset.Associated symptoms comments: Also noticed some numbness in the left leg.  No chest pain, shortness of breath.  Patient had been given multiple nitroglycerin and aspirin without improvement of her symptoms.  She denies any trauma but does have some mild neck pain.  She denies any vision changes, difficulty walking, nausea or vomiting. Nothing aggravates the symptoms. Nothing relieves the symptoms. Treatments tried: ASA and NTG. The treatment provided no relief.    Past Medical History:  Diagnosis Date  . Abnormal nuclear stress test 05/24/2015  . C. difficile colitis - recurrent 06/23/2016  . C. difficile diarrhea on po vanc 10/04/2015  . CAD (coronary artery disease) 2006, 2016   a. 08/2014 NSTEMI: Attempted PCI of 85% RPDA - unable to wire, complicated by dissection, case aborted;  b. 08/2014  Relook Cath: LM nl, LAD 60p, SP1 90ost (small), D1 65ost, RI 45/50, RCA nl, RPDA 90 - healed dissection;  b. 08/2014 Echo: EF 55%, mild/mon midapical inferior HK, mild AI, PASP . c. Cath 05/2015 with CTO of the right-PDA and unsuccessful PCI  . chronic back   . CKD (chronic kidney disease), stage II   . COLONIC POLYPS, ADENOMATOUS, HX OF   . Diverticulitis   . Dyslipidemia    a. statin intolerant.  [name] Dyspnea    with exertion  . Elevated troponin level 10/04/2015  . ENDOMETRIOSIS   . Essential hypertension   . Fibromyalgia   . GASTRIC ANTRAL VASCULAR ECTASIA   . GERD   . History of hiatal hernia   . Hypertension, uncontrolled 10/04/2015  . Insomnia   . IRON DEFICIENCY ANEMIA SECONDARY TO BLOOD LOSS   . Myocardial infarction (HCC) 08/2014  . Neuropathy   . NSTEMI (non-ST elevated myocardial infarction) (HCC) 08/18/2014  . Osteoarthritis    "back, hips, basically all over" (05/24/2015)  . Raynaud's syndrome   . Rosacea, acne   . Sicca syndrome (HCC)   . SJOGREN'S SYNDROME   . SUPRAVENTRICULAR TACHYCARDIA   . Vitamin D deficiency     Patient Active Problem List   Diagnosis Date Noted  . C. difficile colitis - recurrent 06/23/2016  . Hypertension, uncontrolled 10/04/2015  . Essential hypertension   . CAD (coronary artery disease)   . Paresthesia 08/01/2014  . Atypical ductal hyperplasia of right breast  02/22/2014  . Dizziness and giddiness 05/24/2013  . Palpitations 03/06/2011  . SJOGREN'S SYNDROME 04/16/2009  . SUPRAVENTRICULAR TACHYCARDIA 06/24/2008  . Dyslipidemia-statin and Zetia intol 04/20/2007  . Anemia 04/20/2007  . CAD S/P unsuccessful PCI 08/18/14 04/20/2007  . Right bundle branch block 04/20/2007  . Raynaud's syndrome 04/20/2007  . GERD 04/20/2007  . GASTRIC ANTRAL VASCULAR ECTASIA 04/20/2007  . ENDOMETRIOSIS 04/20/2007  . ACNE ROSACEA 04/20/2007  . OSTEOARTHRITIS 04/20/2007  . COLONIC POLYPS, ADENOMATOUS, HX OF 04/20/2007  . HEADACHE, CHRONIC, HX OF  04/20/2007    Past Surgical History:  Procedure Laterality Date  . ABDOMINAL HYSTERECTOMY     left right ovary  . APPENDECTOMY    . BREAST EXCISIONAL BIOPSY Right   . BREAST EXCISIONAL BIOPSY Right   . BREAST LUMPECTOMY WITH RADIOACTIVE SEED LOCALIZATION Right 01/24/2014   Performed by Avel Peace, MD at [facility]  . CANCELLED PROCEDURE, chest pain to be admitted  11/14/2016   Performed by Iva Boop, MD at [facility] ENDOSCOPY  . CARDIAC CATHETERIZATION  2006  . COLONOSCOPY W/ BIOPSIES AND POLYPECTOMY  12/17/2006   adenomatous polyps, external hemorrhoids  . Coronary Balloon Angioplasty  08/18/2014   Performed by Marykay Lex, MD at [facility] INVASIVE CV LAB  . ESOPHAGOGASTRODUODENOSCOPY  11/27/2009   APC tretment of lesions  . EYE SURGERY    . HERNIA REPAIR    . HIATAL HERNIA REPAIR  2005   and paraesophageal  . LAPAROSCOPIC CHOLECYSTECTOMY    . Left Heart Cath and Coronary Angiography N/A 05/24/2015   Performed by Tonny Bollman, MD at [facility] INVASIVE CV LAB  . Left Heart Cath and Coronary Angiography N/A 08/21/2014   Performed by Marykay Lex, MD at [facility] INVASIVE CV LAB  . Left Heart Cath and Coronary Angiography N/A 08/18/2014   Performed by Marykay Lex, MD at [facility] INVASIVE CV LAB  . TEAR DUCT PROBING Left    "no plugs or anything"    OB History    No data available       Home Medications    Prior to Admission medications   Medication Sig Start Date End Date Taking? Authorizing Provider  amLODipine (NORVASC) 2.5 MG tablet Take 2.5 mg by mouth daily. 10/04/15   [provider]  aspirin 81 MG tablet Take 81 mg by mouth daily.    [provider]  Biotin 10 MG TABS Take 10 mg by mouth daily.    [provider]  calcium carbonate (ANTACID) 420 MG CHEW chewable tablet Chew 420 mg by mouth daily.    [provider]  clopidogrel (PLAVIX) 75 MG tablet Take 1 tablet (75 mg total) by mouth daily with breakfast. 10/13/16   Tonny Bollman, MD  cyclobenzaprine (FLEXERIL) 10 MG tablet Take 5-10 mg 3 (three) times daily as needed by mouth for muscle spasms.     [provider]  famotidine (PEPCID) 20 MG tablet Take 1 tablet (20 mg total) by mouth daily. 06/25/16   Vassie Loll, MD  feeding supplement, ENSURE ENLIVE, (ENSURE ENLIVE) LIQD Take 237 mLs by mouth 2 (two) times daily between meals. 06/24/16   Vassie Loll, MD  ferrous sulfate 325 (65 FE) MG tablet Take 325 mg by mouth daily with breakfast. Stopped 11/08/16    [provider]  fluticasone (FLONASE ALLERGY RELIEF) 50 MCG/ACT nasal spray Place 1 spray into the nose daily as needed for allergies.    [provider]  furosemide (LASIX) 20 MG  tablet Take 20 mg daily as needed by mouth (swelling).    [provider]  isosorbide mononitrate (IMDUR) 30 MG 24 hr tablet Take 1 tablet (30 mg total) by mouth daily. 11/30/15   Tonny Bollman, MD  loratadine (CLARITIN) 10 MG tablet Take 10 mg by mouth daily.    [provider]  metoCLOPramide (REGLAN) 10 MG tablet Take 1 tablet (10 mg total) by mouth 2 (two) times daily. 11/06/16   Iva Boop, MD  metoprolol succinate (TOPROL-XL) 25 MG 24 hr tablet Take 12.5 mg by mouth daily. 03/29/16   [provider]  Multiple Vitamins-Minerals (EMERGEN-C VITAMIN C PO) Take 1 tablet by mouth daily.    [provider]  nitroGLYCERIN (NITROSTAT) 0.4 MG SL tablet Place 1 tablet (0.4 mg total) under the tongue every 5 (five) minutes as needed for chest pain. 06/12/15   Tonny Bollman, MD  polyethylene glycol powder [facility]) powder Take 1 Container by mouth once.    [provider]  Polyvinyl Alcohol-Povidone (REFRESH OP) Place 1 drop into both eyes 2 (two) times daily.     [provider]  saccharomyces boulardii (FLORASTOR) 250 MG capsule Take 250 mg by mouth 2 (two) times daily.    [provider]  temazepam (RESTORIL) 15 MG capsule Take 15 mg by mouth at  bedtime as needed for sleep. SLEEP AID 08/03/13   [provider]  traZODone (DESYREL) 50 MG tablet Take half (1/2) to one (1) tablet (25 mg to 50 mg total) by mouth daily at bedtime as needed for sleep. 05/13/16   [provider]  vancomycin (VANCOCIN HCL) 125 MG capsule Take tid x 5 days BID x 5 days QD x 5 days QOD x 5 doses Q 3 days x 5 doses 10/22/16   Iva Boop, MD    Family History Family History  Problem Relation Age of Onset  . Heart failure Mother   . Diabetes Mother   . Kidney failure Mother   . Hypertension Mother   . Coronary artery disease Mother   . Heart disease Mother   . Hyperlipidemia Mother   . Peripheral vascular disease Mother        amputation  . Breast cancer Sister        x 3 sisters  . Cancer Sister   . Heart disease Father   . Hypertension Father   . Coronary artery disease Father   . Heart attack Father   . Coronary artery disease Brother   . Heart disease Brother   . Coronary artery disease Brother   . Coronary artery disease Brother   . Breast cancer Sister   . Breast cancer Sister   . Breast cancer Daughter   . Cancer Daughter   . Colon cancer Neg Hx   . Esophageal cancer Neg Hx   . Stomach cancer Neg Hx   . Rectal cancer Neg Hx     Social History Social History   Tobacco Use  . Smoking status: Never Smoker  . Smokeless tobacco: Never Used  Substance Use Topics  . Alcohol use: Yes    Comment: occasional wine  . Drug use: No     Allergies   Ezetimibe; Ezetimibe-simvastatin; Morphine; Rosuvastatin; Ambien [zolpidem tartrate]; Plaquenil [hydroxychloroquine sulfate]; Polytrim [polymyxin b-trimethoprim]; and Prevacid [lansoprazole]   Review of Systems Review of Systems  All other systems reviewed and are negative.    Physical Exam Updated Vital Signs BP (!) 150/58 (BP Location: Right Arm)  Pulse 61   Temp 98.2 F (36.8 C) (Oral)   Resp 16   Ht 5\' 2"  (1.575 m)   Wt 56.2 kg (124 lb)   SpO2 98%    BMI 22.68 kg/m   Physical Exam  Constitutional: She is oriented to person, place, and time. She appears well-developed and well-nourished. No distress.  HENT:  Head: Normocephalic and atraumatic.  Mouth/Throat: Oropharynx is clear and moist.  Eyes: Conjunctivae and EOM are normal. Pupils are equal, round, and reactive to light.  Neck: Normal range of motion. Neck supple. Muscular tenderness present.    Cardiovascular: Normal rate, regular rhythm and intact distal pulses.  No murmur heard. Pulmonary/Chest: Effort normal and breath sounds normal. No respiratory distress. She has no wheezes. She has no rales.  Abdominal: Soft. She exhibits no distension. There is no tenderness. There is no rebound and no guarding.  Musculoskeletal: Normal range of motion. She exhibits no edema or tenderness.  Mild discoloration of the second through fourth fingers on both hands with mild blue and white discoloration.  2+ radial pulse bilaterally  Neurological: She is alert and oriented to person, place, and time.  No notable pronator drift in upper or lower extremities.  Patient is able to ambulate without difficulty.  Sensation intact bilaterally.  5 out of 5 strength in the upper and lower extremities bilaterally.  No facial droop.  No aphasia  Skin: Skin is warm and dry. No rash noted. No erythema.  Psychiatric: She has a normal mood and affect. Her behavior is normal.  Nursing note and vitals reviewed.    ED Treatments / Results  Labs (all labs ordered are listed, but only abnormal results are displayed) Labs Reviewed  CBC - Abnormal; Notable for the following components:      Result Value   Hemoglobin 11.5 (*)    HCT 35.3 (*)    All other components within normal limits  COMPREHENSIVE METABOLIC PANEL - Abnormal; Notable for the following components:   Sodium 130 (*)    Total Protein 6.1 (*)    ALT 13 (*)    GFR calc non Af Amer 59 (*)    All other components within normal limits    URINALYSIS, ROUTINE W REFLEX MICROSCOPIC - Abnormal; Notable for the following components:   Leukocytes, UA SMALL (*)    Squamous Epithelial / LPF 0-5 (*)    Crystals PRESENT (*)    All other components within normal limits  I-STAT CHEM 8, ED - Abnormal; Notable for the following components:   Sodium 131 (*)    Chloride 100 (*)    All other components within normal limits  ETHANOL  PROTIME-INR  APTT  DIFFERENTIAL  RAPID URINE DRUG SCREEN, HOSP PERFORMED  I-STAT TROPONIN, ED  I-STAT TROPONIN, ED    EKG  EKG Interpretation  Date/Time:  Friday January 30 2017 13:06:08 EST Ventricular Rate:  62 PR Interval:  136 QRS Duration: 80 QT Interval:  430 QTC Calculation: 436 R Axis:   80 Text Interpretation:  Normal sinus rhythm Possible Left atrial enlargement No significant change since last tracing Confirmed by Gwyneth SproutPlunkett, Stellarose Cerny ([id number]) on 01/30/2017 1:35:25 PM       Radiology No results found.  Procedures Procedures (including critical care time)  Medications Ordered in ED Medications - No data to display   Initial Impression / Assessment and Plan / ED Course  I have reviewed the triage vital signs and the nursing notes.  Pertinent labs & imaging results that were  available during my care of the patient were reviewed by me and considered in my medical decision making (see chart for details).     Presenting today with symptoms concerning for potential stroke that started approximately 3 hours ago.  She is complaining of some numbness and heaviness in the left arm and leg.  This started approximately at 1030.  Current NIH is 0.  Patient is not having any chest pain, shortness of breath or cardiac symptoms.  Her EKG is unchanged and initial troponin is negative.  Discussed patient with neurology who came and evaluated the patient.  Given low NIH do not feel that she needs to be a code stroke at this time however patient will go directly for MRI for further evaluation.  Will  do a delta troponin as well.  4:42 PM Labs are reassuring.  Pt still waiting for MRI.  Delta trop pending.  Pt checked out to Dr. Julieanne Manson  Final Clinical Impressions(s) / ED Diagnoses   Final diagnoses:  None    ED Discharge Orders    None       Gwyneth Sprout, MD 01/30/17 1642

## 2017-01-30 NOTE — ED Triage Notes (Signed)
Pt arrived via [facility] from [place] where pt was having a follow up with her PCP when she developed arm numbness/tingling. Pt was given 324 ASA and 3 Nitro PTA. Pt did not take any home medications today.

## 2017-01-30 NOTE — Discharge Instructions (Signed)
Your workup today did not show evidence of acute stroke.  The MRI showed no new stroke.  Your cardiac enzymes were negative x2.  It is unclear of the exact cause of your numbness but the neurology team felt you were safe and not having a stroke.  Please follow-up with your primary care physician for further evaluation and management.  If any symptoms change or worsen, please return to the nearest emergency department.

## 2017-01-30 NOTE — ED Notes (Signed)
Pt ambulated to RR with steady gait

## 2017-01-30 NOTE — ED Notes (Signed)
Pt ambulated to restroom with assistance. Steady.

## 2017-01-30 NOTE — ED Provider Notes (Signed)
5:34 PM Care assumed from Dr. Anitra Lauth.  At time of transfer care, patient is awaiting diagnostic testing including a delta troponin and the MRI of her brain.  If diagnostic testing is reassuring, and of care is for patient to be discharged back home.  Will reassess patient after diagnostic testing.  Patient's MRI showed no acute stroke.  There was evidence of chronic ischemic disease.  Given patient's reported resolution of the numbness, I do not feel patient is currently having a stroke.  Review of neurology recommendation show the patient is likely safe for discharge home if MRI showed no stroke.  Delta troponin was negative.  Do not feel the patient's arm symptoms and leg symptoms were due to a cardiac etiology.  Given resolution of numbness and patient well appearance, patient was felt stable for discharge home.  Patient will follow-up with your PCP in the next several days.  Return precautions were given and understood.  Patient had no other questions or concerns and was discharged in good condition.  Patient was able to ambulate without difficulty on the way out of the emergency department.  Clinical Impression: 1. Left sided numbness     Disposition: Discharge  Condition: Good  I have discussed the results, Dx and Tx plan with the pt(& family if present). He/she/they expressed understanding and agree(s) with the plan. Discharge instructions discussed at great length. Strict return precautions discussed and pt &/or family have verbalized understanding of the instructions. No further questions at time of discharge.    This SmartLink is deprecated. Use AVSMEDLIST instead to display the medication list for a patient.  Follow Up: Laurann Montana, MD [address] [place] [address] [phone]     [facility] EMERGENCY DEPARTMENT [address] [id number] mc [place] [address] [phone]  If symptoms worsen       Sharmane Dame, Canary Brim, MD 01/31/17 [phone]

## 2017-01-30 NOTE — ED Notes (Signed)
ED Provider at bedside. 

## 2017-01-30 NOTE — ED Notes (Signed)
Patient given discharge instructions and verbalized understanding.  Patient stable to discharge at this time.  Patient is alert and oriented to baseline.  No distressed noted at this time.  All belongings taken with the patient at discharge.   

## 2017-02-16 ENCOUNTER — Ambulatory Visit (HOSPITAL_COMMUNITY)
Admission: RE | Admit: 2017-02-16 | Discharge: 2017-02-16 | Disposition: A | Discharge status: Home / Self Care | Payer: Medicare Other | Source: Ambulatory Visit | Attending: Cardiovascular Disease | Admitting: Cardiovascular Disease

## 2017-02-16 DIAGNOSIS — I6522 Occlusion and stenosis of left carotid artery: Secondary | ICD-10-CM | POA: Insufficient documentation

## 2017-02-16 DIAGNOSIS — R0989 Other specified symptoms and signs involving the circulatory and respiratory systems: Secondary | ICD-10-CM | POA: Diagnosis not present

## 2017-02-17 ENCOUNTER — Other Ambulatory Visit: Payer: Self-pay | Admitting: Family Medicine

## 2017-02-17 ENCOUNTER — Ambulatory Visit
Admission: RE | Admit: 2017-02-17 | Discharge: 2017-02-17 | Disposition: A | Discharge status: Home / Self Care | Payer: Medicare Other | Source: Ambulatory Visit | Attending: Family Medicine | Admitting: Family Medicine

## 2017-02-17 DIAGNOSIS — R7989 Other specified abnormal findings of blood chemistry: Secondary | ICD-10-CM

## 2017-02-17 DIAGNOSIS — R0781 Pleurodynia: Secondary | ICD-10-CM

## 2017-02-17 MED ORDER — IOPAMIDOL (ISOVUE-370) INJECTION 76%
60.0000 mL | Freq: Once | INTRAVENOUS | Status: AC | PRN
  Administered 2017-02-17: 60 mL via INTRAVENOUS

## 2017-02-19 ENCOUNTER — Other Ambulatory Visit: Payer: Self-pay | Admitting: Cardiovascular Disease

## 2017-04-27 ENCOUNTER — Telehealth: Payer: Self-pay | Admitting: Cardiovascular Disease

## 2017-04-27 NOTE — Telephone Encounter (Addendum)
Pt at church yesterday and got up to line dance and developed left sided CP.  Pt states she has had this occur 2-3 other times but this was much more severe than in the past.  Pain shot down left arm and arm went numb.  Pt sat down and rested for about 30 mins and then drove home.  Once home she took a Nitro and pain relieved.  Denies any other sx.  BP once she got home was 176/51.  Pt denies any issues since this occurrence and feels fine today.  Advised I would send message to Dr. Excell Seltzer for review and advisement.

## 2017-04-27 NOTE — Telephone Encounter (Signed)
Left message to call back  

## 2017-04-27 NOTE — Telephone Encounter (Signed)
New Message   Pt c/o of Chest Pain: STAT if CP now or developed within 24 hours  1. Are you having CP right now? Not right now    2. Are you experiencing any other symptoms (ex. SOB, nausea, vomiting, sweating)? No symptoms  3. How long have you been experiencing CP? Just last evening (04/27/2017)  4. Is your CP continuous or coming and going? continuous pain  5. Have you taken Nitroglycerin? Took one last night  ? Patient states that last night while dancing she experience some chest pains. She did take a nitroglycerin last night and then the pain subsided. No pain today.

## 2017-04-27 NOTE — Telephone Encounter (Signed)
Follow up  ° ° °Patient is returning call.  °

## 2017-04-30 ENCOUNTER — Telehealth: Payer: Self-pay | Admitting: Cardiovascular Disease

## 2017-04-30 NOTE — Telephone Encounter (Signed)
Pt c/o of Chest Pain: 1. Are you having CP right now?no  2. Are you experiencing any other symptoms (ex. SOB, nausea, vomiting, sweating)? Left Arm is tingling and hurting   3. How long have you been experiencing CP?4:20pm 4. Is your CP continuous or coming and going?coming and going  5. Have you taken Nitroglycerin?no

## 2017-04-30 NOTE — Telephone Encounter (Signed)
Ok to observe for now since she is feeling ok. If symptoms recur recommend working her in for a visit with Lorin Picket or I.   thx

## 2017-04-30 NOTE — Telephone Encounter (Signed)
Patient calling complaining of chest pain, left arm tingling/hurting. Patient stated she just took a nitro and it's feeling better. Patient stated chest pain has been coming and going since Sunday. Patient stated Sunday her chest pain was so bad she had to leave church. Patient denies any other symptoms. Patient has history of CAD. Patient refusing to go to ED and she is wanting to see Dr. Excell Seltzer tomorrow. Dr. Excell Seltzer happens to have an opening tomorrow, scheduled patient to see him tomorrow at 10:00 am. Encouraged patient to go to ED if her symptoms get worse. Patient refusing to go to the ED. Patient stated she would just see Dr. Excell Seltzer tomorrow.

## 2017-04-30 NOTE — Telephone Encounter (Signed)
Instructed patient to continue to observe for now as she is still asymptomatic.  She understands to call if symptoms reoccur for an appointment. She was grateful for assistance.

## 2017-05-01 ENCOUNTER — Encounter: Payer: Self-pay | Admitting: Cardiovascular Disease

## 2017-05-01 ENCOUNTER — Ambulatory Visit: Payer: Medicare Other | Admitting: Cardiovascular Disease

## 2017-05-01 ENCOUNTER — Encounter (INDEPENDENT_AMBULATORY_CARE_PROVIDER_SITE_OTHER): Payer: Self-pay

## 2017-05-01 VITALS — BP 122/80 | HR 69 | Ht 63.0 in | Wt 125.8 lb

## 2017-05-01 DIAGNOSIS — I25119 Atherosclerotic heart disease of native coronary artery with unspecified angina pectoris: Secondary | ICD-10-CM

## 2017-05-01 DIAGNOSIS — R0602 Shortness of breath: Secondary | ICD-10-CM

## 2017-05-01 DIAGNOSIS — R079 Chest pain, unspecified: Secondary | ICD-10-CM | POA: Diagnosis not present

## 2017-05-01 LAB — BASIC METABOLIC PANEL
BUN/Creatinine Ratio: 12 (ref 12–28)
BUN: 11 mg/dL (ref 8–27)
CALCIUM: 10 mg/dL (ref 8.7–10.3)
CO2: 21 mmol/L (ref 20–29)
CREATININE: 0.9 mg/dL (ref 0.57–1.00)
Chloride: 100 mmol/L (ref 96–106)
GFR calc non Af Amer: 61 mL/min/{1.73_m2} (ref >59)
GFR, EST AFRICAN AMERICAN: 70 mL/min/{1.73_m2} (ref >59)
Glucose: 100 mg/dL — ABNORMAL HIGH (ref 65–99)
Potassium: 4.6 mmol/L (ref 3.5–5.2)
Sodium: 135 mmol/L (ref 134–144)

## 2017-05-01 LAB — TROPONIN T: Troponin T TROPT: <0.011 ng/mL (ref <0.011)

## 2017-05-01 LAB — PRO B NATRIURETIC PEPTIDE: NT-PRO BNP: 1000 pg/mL — AB (ref 0–738)

## 2017-05-01 NOTE — Progress Notes (Signed)
Cardiology Office Note Date:  05/01/2017   ID:  [name], DOB [birth year], MRN [id number]  PCP:  Laurann Montana, MD  Cardiologist:  Tonny Bollman, MD    Chief Complaint  Patient presents with  . Shortness of Breath  . Chest Pain     History of Present Illness: [name] is a 82 y.o. female who presents for evaluation of chest pain.  She called into the office yesterday with chest pain and was advised to go to the emergency room.  She declined and was added onto my schedule today.  She's here alone today. She feels 'terrible.' States she 'can hardly go.' She feels lightheaded and weak. She started having chest pain associated with dancing at a church function this weekend. She's had intermittent chest pain since then. Some episodes have had associated weakness and tingling into the left arm. She took NTG last night with relief of symptoms. Describes the pain as sharp and located in the left breast area.   Past Medical History:  Diagnosis Date  . Abnormal nuclear stress test 05/24/2015  . C. difficile colitis - recurrent 06/23/2016  . C. difficile diarrhea on po vanc 10/04/2015  . CAD (coronary artery disease) 2006, 2016   a. 08/2014 NSTEMI: Attempted PCI of 85% RPDA - unable to wire, complicated by dissection, case aborted;  b. 08/2014 Relook Cath: LM nl, LAD 60p, SP1 90ost (small), D1 65ost, RI 45/50, RCA nl, RPDA 90 - healed dissection;  b. 08/2014 Echo: EF 55%, mild/mon midapical inferior HK, mild AI, PASP . c. Cath 05/2015 with CTO of the right-PDA and unsuccessful PCI  . chronic back   . CKD (chronic kidney disease), stage II   . COLONIC POLYPS, ADENOMATOUS, HX OF   . Diverticulitis   . Dyslipidemia    a. statin intolerant.  [name] Dyspnea    with exertion  . Elevated troponin level 10/04/2015  . ENDOMETRIOSIS   . Essential hypertension   . Fibromyalgia   . GASTRIC ANTRAL VASCULAR ECTASIA   . GERD   . History of hiatal hernia   . Hypertension, uncontrolled 10/04/2015    . Insomnia   . IRON DEFICIENCY ANEMIA SECONDARY TO BLOOD LOSS   . Myocardial infarction (HCC) 08/2014  . Neuropathy   . NSTEMI (non-ST elevated myocardial infarction) (HCC) 08/18/2014  . Osteoarthritis    "back, hips, basically all over" (05/24/2015)  . Raynaud's syndrome   . Rosacea, acne   . Sicca syndrome (HCC)   . SJOGREN'S SYNDROME   . SUPRAVENTRICULAR TACHYCARDIA   . Vitamin D deficiency     Past Surgical History:  Procedure Laterality Date  . ABDOMINAL HYSTERECTOMY     left right ovary  . APPENDECTOMY    . BREAST EXCISIONAL BIOPSY Right   . BREAST EXCISIONAL BIOPSY Right   . BREAST LUMPECTOMY WITH RADIOACTIVE SEED LOCALIZATION Right 01/24/2014   Procedure: BREAST LUMPECTOMY WITH RADIOACTIVE SEED LOCALIZATION;  Surgeon: Avel Peace, MD;  Location: [facility];  Service: General;  Laterality: Right;  . CARDIAC CATHETERIZATION N/A 08/18/2014   Procedure: Left Heart Cath and Coronary Angiography;  Surgeon: Marykay Lex, MD;  Location: [facility] INVASIVE CV LAB;  Service: Cardiovascular;  Laterality: N/A;  . CARDIAC CATHETERIZATION  08/18/2014   Procedure: Coronary Balloon Angioplasty;  Surgeon: Marykay Lex, MD;  Location: [facility] INVASIVE CV LAB;  Service: Cardiovascular;;  . CARDIAC CATHETERIZATION N/A 08/21/2014   Procedure: Left Heart Cath and Coronary Angiography;  Surgeon: Marykay Lex, MD;  Location: [facility] INVASIVE CV LAB;  Service: Cardiovascular;  Laterality: N/A;  . CARDIAC CATHETERIZATION  2006  . CARDIAC CATHETERIZATION N/A 05/24/2015   Procedure: Left Heart Cath and Coronary Angiography;  Surgeon: Tonny Bollman, MD;  Location: [facility] INVASIVE CV LAB;  Service: Cardiovascular;  Laterality: N/A;  . COLONOSCOPY W/ BIOPSIES AND POLYPECTOMY  12/17/2006   adenomatous polyps, external hemorrhoids  . ESOPHAGOGASTRODUODENOSCOPY  11/27/2009   APC tretment of lesions  . EYE SURGERY    . HERNIA REPAIR    . HIATAL HERNIA REPAIR  2005   and paraesophageal  . LAPAROSCOPIC  CHOLECYSTECTOMY    . TEAR DUCT PROBING Left    "no plugs or anything"    Current Outpatient Medications  Medication Sig Dispense Refill  . amLODipine (NORVASC) 2.5 MG tablet Take 2.5 mg by mouth daily.    [name] aspirin 81 MG tablet Take 81 mg by mouth daily.    . Biotin 10 MG TABS Take 10 mg by mouth daily.    . calcium carbonate (ANTACID) 420 MG CHEW chewable tablet Chew 420 mg by mouth daily.    . clopidogrel (PLAVIX) 75 MG tablet Take 1 tablet (75 mg total) by mouth daily with breakfast. 90 tablet 2  . cyclobenzaprine (FLEXERIL) 10 MG tablet Take 5-10 mg 3 (three) times daily as needed by mouth for muscle spasms.     . famotidine (PEPCID) 20 MG tablet Take 1 tablet (20 mg total) by mouth daily. 30 tablet 1  . feeding supplement, ENSURE ENLIVE, (ENSURE ENLIVE) LIQD Take 237 mLs by mouth 2 (two) times daily between meals.    . ferrous sulfate 325 (65 FE) MG tablet Take 325 mg by mouth daily with breakfast. Stopped 11/08/16    . fluticasone (FLONASE ALLERGY RELIEF) 50 MCG/ACT nasal spray Place 1 spray into the nose daily as needed for allergies.    . furosemide (LASIX) 20 MG tablet Take 20 mg daily as needed by mouth (swelling).    . isosorbide mononitrate (IMDUR) 30 MG 24 hr tablet TAKE ONE TABLET BY MOUTH ONCE DAILY 90 tablet 3  . loratadine (CLARITIN) 10 MG tablet Take 10 mg by mouth daily.    . metoCLOPramide (REGLAN) 10 MG tablet Take 1 tablet (10 mg total) by mouth 2 (two) times daily. 2 tablet 0  . metoprolol succinate (TOPROL-XL) 25 MG 24 hr tablet Take 12.5 mg by mouth daily.    . Multiple Vitamins-Minerals (EMERGEN-C VITAMIN C PO) Take 1 tablet by mouth daily.    . nitroGLYCERIN (NITROSTAT) 0.4 MG SL tablet Place 1 tablet (0.4 mg total) under the tongue every 5 (five) minutes as needed for chest pain. 25 tablet 1  . polyethylene glycol powder (MIRALAX) powder Take 1 Container by mouth once.    . Polyvinyl Alcohol-Povidone (REFRESH OP) Place 1 drop into both eyes 2 (two) times daily.       [name] saccharomyces boulardii (FLORASTOR) 250 MG capsule Take 250 mg by mouth 2 (two) times daily.    . temazepam (RESTORIL) 15 MG capsule Take 15 mg by mouth at bedtime as needed for sleep. SLEEP AID    . traZODone (DESYREL) 50 MG tablet Take half (1/2) to one (1) tablet (25 mg to 50 mg total) by mouth daily at bedtime as needed for sleep.     No current facility-administered medications for this visit.     Allergies:   Ezetimibe; Ezetimibe-simvastatin; Morphine; Rosuvastatin; Ambien [zolpidem tartrate]; Plaquenil [hydroxychloroquine sulfate]; Polytrim [polymyxin b-trimethoprim]; and Prevacid [lansoprazole]  Social History:  The patient  reports that  has never smoked. she has never used smokeless tobacco. She reports that she drinks alcohol. She reports that she does not use drugs.   Family History:  The patient's family history includes Breast cancer in her daughter, sister, sister, and sister; Cancer in her daughter and sister; Coronary artery disease in her brother, brother, brother, father, and mother; Diabetes in her mother; Heart attack in her father; Heart disease in her brother, father, and mother; Heart failure in her mother; Hyperlipidemia in her mother; Hypertension in her father and mother; Kidney failure in her mother; Peripheral vascular disease in her mother.    ROS:  Please see the history of present illness.  All other systems are reviewed and negative.    PHYSICAL EXAM: VS:  BP 122/80   Pulse 69   Ht 5\' 3"  (1.6 m)   Wt 125 lb 12.8 oz (57.1 kg)   BMI 22.28 kg/m  , BMI Body mass index is 22.28 kg/m. GEN: Well nourished, well developed, in no acute distress  HEENT: normal  Neck: no JVD, no masses. No carotid bruits Cardiac: RRR with 2/6 SEM at the RUSB   Respiratory:  clear to auscultation bilaterally, normal work of breathing GI: soft, nontender, nondistended, + BS MS: no deformity or atrophy  Ext: no pretibial edema, pedal pulses 2+= bilaterally Skin: warm and dry,  no rash Neuro:  Strength and sensation are intact Psych: euthymic mood, full affect  EKG:  EKG is ordered today. The ekg ordered today shows normal sinus rhythm 69 bpm, within normal limits.  Recent Labs: 01/30/2017: ALT 13; BUN 11; Creatinine, Ser 0.90; Hemoglobin 12.6; Platelets 197; Potassium 4.4; Sodium 131   Lipid Panel     Component Value Date/Time   CHOL 162 08/19/2014 0255   TRIG 98 08/19/2014 0255   HDL 54 08/19/2014 0255   CHOLHDL 3.0 08/19/2014 0255   VLDL 20 08/19/2014 0255   LDLCALC 88 08/19/2014 0255      Wt Readings from Last 3 Encounters:  05/01/17 125 lb 12.8 oz (57.1 kg)  01/30/17 124 lb (56.2 kg)  01/29/17 124 lb 1.9 oz (56.3 kg)     Cardiac Studies Reviewed: Echo 05-17-2015: Study Conclusions  - Left ventricle: The cavity size was normal. Wall thickness was   normal. Systolic function was normal. The estimated ejection   fraction was in the range of 55% to 60%. Wall motion was normal;   there were no regional wall motion abnormalities. Doppler   parameters are consistent with abnormal left ventricular   relaxation (grade 1 diastolic dysfunction). - Aortic valve: There was mild stenosis. There was mild   regurgitation. Mean gradient (S): 8 mm Hg. Peak gradient (S): 16   mm Hg. - Mitral valve: Calcified annulus. Mildly thickened leaflets . - Pulmonary arteries: PA peak pressure: 34 mm Hg (S).  Nuclear Stress Test: Study Highlights    The left ventricular ejection fraction is normal (55-65%).  Nuclear stress EF: 55%.  Blood pressure demonstrated a hypotensive response to exercise.  There was no ST segment deviation noted during stress.  No T wave inversion was noted during stress.  Defect 1: There is a large defect of moderate severity present in the basal inferolateral, mid inferolateral, apical inferior, apical lateral and apex location.  This is a high risk study.  Findings consistent with ischemia.   High risk stress nuclear study  with ischemia in the territory of a known right coronary artery lesion,  but also with ischemia in the distribution of the distal LAD artery, new when compared to study from 2015. Normal left ventricular regional and global systolic function.   Cath 05-24-2015: Conclusion    Prox RCA lesion, 25% stenosed.  Dist RCA lesion, 25% stenosed.  Prox Cx to Mid Cx lesion, 50% stenosed.  Prox LAD lesion, 50% stenosed.  Dist LAD lesion, 25% stenosed.  RPDA lesion, 100% stenosed. Post intervention, there is a 100% residual stenosis.   1. Severe single-vessel coronary artery disease with total occlusion of the right PDA, attempted PCI unsuccessful due to inability to cross lesion  2. Heavily calcified coronary arteries with mild to moderate diffuse CAD involving the LAD and left circumflex, appropriate for medical therapy  3. Normal LV function by noninvasive assessment   Left Anterior Descending  Prox LAD lesion 50% stenosed  Calcified. There is a heavily calcified lesion in the proximal LAD at the origin of the first diagonal. There is no significant change from the previous study last year. This does not appear to be flow obstructive and I would estimated at 50%. The remaining portions of the LAD are heavily calcified with mild nonobstructive disease noted.  Dist LAD lesion 25% stenosed  Calcified diffuse.  Left Circumflex  Prox Cx to Mid Cx lesion 50% stenosed  Diffuse. There is diffuse, calcified nonobstructive stenosis in the left circumflex up to 50%  Right Coronary Artery  This is a large, dominant vessel. The entire vessel is heavily calcified. There are no high-grade lesions throughout the proximal, mid, or distal RCA. The PLA branches are patent, but the PDA branch is totally occluded.  Prox RCA lesion 25% stenosed  Calcified diffuse.  Dist RCA lesion 25% stenosed  Calcified diffuse.  Right Posterior Descending Artery  RPDA lesion 100% stenosed  Calcified.   Diagnostic Diagram        Post-Intervention Diagram         ASSESSMENT AND PLAN: 1.  Coronary artery disease, native vessel, with angina: Fortunately the patient's EKG is normal.  I am going to check a troponin as she has had an increase in her symptoms.  I went back and looked at her last coronary angiogram from 2017 which demonstrated moderate diffuse nonobstructive disease in the LAD, mild nonobstructive disease in the circumflex, and total occlusion of the right PDA with left to right collaterals.  The patient is on a good antianginal regimen, isosorbide, and metoprolol.  She remains on dual antiplatelet therapy with aspirin and clopidogrel.  I had a lengthy discussion with her today.  She is under a good bit of stress and is trying to move to the [facility] but having some issues with that.  I really think this is exacerbating her symptoms.  I am not inclined to move forward with cardiac catheterization unless her enzymes are abnormal or she has progressive symptoms.  2.  Hypertension: Blood pressure is well controlled on current medicines.  These are reviewed today and no changes are recommended.  3.  Hyperlipidemia: The patient is intolerant to any lipid lowering agents.  4.  Mild aortic stenosis: Continue clinical follow-up.  Last echo is reviewed. Will update an echo since she complains of shortness of breath and fatigue.   Current medicines are reviewed with the patient today.  The patient does not have concerns regarding medicines.  Labs/ tests ordered today include:   Orders Placed This Encounter  Procedures  . Basic metabolic panel  . Pro b natriuretic peptide (BNP)  .  Troponin T  . EKG 12-Lead  . ECHOCARDIOGRAM COMPLETE    Disposition:   FU 4 months  Signed, Tonny Bollman, MD  05/01/2017 1:43 PM    [facility] [address] [place], [place], [place]  [address] Phone: [phone]; Fax: [phone]

## 2017-05-01 NOTE — Patient Instructions (Signed)
Medication Instructions:  Your provider recommends that you continue on your current medications as directed. Please refer to the Current Medication list given to you today.    Labwork: TODAY: Troponin, BMET, BNP  Testing/Procedures: Your provider has requested that you have an echocardiogram. Echocardiography is a painless test that uses sound waves to create images of your heart. It provides your doctor with information about the size and shape of your heart and how well your heart's chambers and valves are working. This procedure takes approximately one hour. There are no restrictions for this procedure.  Follow-Up: Your provider recommends that you schedule a follow-up appointment in 4 months with Dr. Excell Seltzer.  Any Other Special Instructions Will Be Listed Below (If Applicable).     If you need a refill on your cardiac medications before your next appointment, please call your pharmacy.

## 2017-05-07 ENCOUNTER — Other Ambulatory Visit: Payer: Self-pay | Admitting: *Deleted

## 2017-05-07 MED ORDER — FUROSEMIDE 20 MG PO TABS: 20.0000 mg | ORAL_TABLET | Freq: Every day | ORAL | 9 refills | Status: DC

## 2017-05-11 ENCOUNTER — Other Ambulatory Visit: Payer: Self-pay

## 2017-05-11 ENCOUNTER — Ambulatory Visit (HOSPITAL_COMMUNITY): Payer: Medicare Other | Attending: Cardiovascular Disease

## 2017-05-11 DIAGNOSIS — G8929 Other chronic pain: Secondary | ICD-10-CM | POA: Diagnosis not present

## 2017-05-11 DIAGNOSIS — I352 Nonrheumatic aortic (valve) stenosis with insufficiency: Secondary | ICD-10-CM | POA: Insufficient documentation

## 2017-05-11 DIAGNOSIS — I252 Old myocardial infarction: Secondary | ICD-10-CM | POA: Insufficient documentation

## 2017-05-11 DIAGNOSIS — M797 Fibromyalgia: Secondary | ICD-10-CM | POA: Insufficient documentation

## 2017-05-11 DIAGNOSIS — R079 Chest pain, unspecified: Secondary | ICD-10-CM | POA: Diagnosis present

## 2017-05-11 DIAGNOSIS — N189 Chronic kidney disease, unspecified: Secondary | ICD-10-CM | POA: Diagnosis not present

## 2017-05-11 DIAGNOSIS — E785 Hyperlipidemia, unspecified: Secondary | ICD-10-CM | POA: Insufficient documentation

## 2017-05-11 DIAGNOSIS — R0602 Shortness of breath: Secondary | ICD-10-CM | POA: Diagnosis not present

## 2017-05-11 DIAGNOSIS — M549 Dorsalgia, unspecified: Secondary | ICD-10-CM | POA: Insufficient documentation

## 2017-05-11 DIAGNOSIS — I129 Hypertensive chronic kidney disease with stage 1 through stage 4 chronic kidney disease, or unspecified chronic kidney disease: Secondary | ICD-10-CM | POA: Insufficient documentation

## 2017-06-02 ENCOUNTER — Telehealth: Payer: Self-pay

## 2017-06-02 DIAGNOSIS — I5189 Other ill-defined heart diseases: Secondary | ICD-10-CM

## 2017-06-02 DIAGNOSIS — I272 Pulmonary hypertension, unspecified: Secondary | ICD-10-CM

## 2017-06-02 MED ORDER — FUROSEMIDE 20 MG PO TABS: 20.0000 mg | ORAL_TABLET | Freq: Every day | ORAL | 3 refills | Status: DC

## 2017-06-02 NOTE — Telephone Encounter (Signed)
Informed patient of results and verbal understanding expressed.  Instructed patient to INCREASE LASIX to daily.  BMET and BNP scheduled 4/16. Patient agrees with treatment plan.

## 2017-06-02 NOTE — Telephone Encounter (Signed)
-----   Message from Tonny Bollman, MD sent at 05/25/2017  7:57 AM EDT ----- Normal LV function, mild valvular disease. Diastolic dysfunction noted. Pulmonary HTN noted. Would start taking furosemide as a scheduled daily medication. Repeat BMET and BNP in about 4 weeks.

## 2017-06-10 ENCOUNTER — Other Ambulatory Visit: Payer: Self-pay | Admitting: Family Medicine

## 2017-06-10 ENCOUNTER — Telehealth: Payer: Self-pay | Admitting: Cardiovascular Disease

## 2017-06-10 ENCOUNTER — Ambulatory Visit
Admission: RE | Admit: 2017-06-10 | Discharge: 2017-06-10 | Disposition: A | Discharge status: Home / Self Care | Payer: Medicare Other | Source: Ambulatory Visit | Attending: Family Medicine | Admitting: Family Medicine

## 2017-06-10 DIAGNOSIS — M542 Cervicalgia: Secondary | ICD-10-CM

## 2017-06-10 DIAGNOSIS — M25512 Pain in left shoulder: Secondary | ICD-10-CM

## 2017-06-10 NOTE — Telephone Encounter (Signed)
Left message for patient that her echo results were forwarded to her PCP and notes on there stated she could not be reached - this is perhaps what they told her about. Reiterated that results were reviewed with her, recommendations were shared and she agreed with plan. Instructed her to continue current treatment plan and to call if she has any further questions or concerns.

## 2017-06-10 NOTE — Telephone Encounter (Signed)
New message  Pt verbalized that she is calling for RN  She said that she has not received any call about any results  She verbalized that her PCP office stated that Dr.Coopers office has informed her PCP that they were unable to reach her  Pt wants rn to call

## 2017-06-30 ENCOUNTER — Other Ambulatory Visit: Payer: Medicare Other

## 2017-07-02 ENCOUNTER — Telehealth: Payer: Self-pay | Admitting: Cardiovascular Disease

## 2017-07-02 NOTE — Telephone Encounter (Signed)
Patient does not need repeat labs. She had them drawn at outside facility and they were stable.  Attempted to call patient. Line is busy. Will try again later.

## 2017-07-02 NOTE — Telephone Encounter (Signed)
New Message  Pt calling to see if she needed that lab appt on 4/16 that she cancelled because she didn't make it. Please call

## 2017-07-10 NOTE — Telephone Encounter (Signed)
Attempted to call patient x2.  Phone picked up and then cut off both attempts.  Will try again later.

## 2017-07-20 ENCOUNTER — Telehealth: Payer: Self-pay

## 2017-07-20 NOTE — Telephone Encounter (Signed)
   Primary Cardiologist: Tonny Bollman, MD  Chart reviewed as part of pre-operative protocol coverage. Left voice mail to call between preop hours.  [place], [place] 07/20/2017, 3:42 PM

## 2017-07-20 NOTE — Telephone Encounter (Signed)
Follow up   Patient is returning call in reference to pre-op clearance.    Request for surgical clearance:  1. What type of surgery is being performed?  Cataract extraction w/Intraocular lens implantation of the RIGHT EYE/ LEFT EYE, followed by the RIGHT EYE/ LEFT EYE    2. When is this surgery scheduled? 07/31/17   3. What type of clearance is required (medical clearance vs. Pharmacy clearance to hold med vs. Both)? Medical Clearance  4. Are there any medications that need to be held prior to surgery and how long?  None    5. Practice name and name of physician performing surgery? [facility], [place]   6. What is your office phone number [phone]    7.   What is your office fax number [phone]  8.   Anesthesia type (None, local, MAC, general) ? Topical anesthesia w/IV medication    Mendel Ryder 07/20/2017, 12:09 PM  _________________________________________________________________   (provider comments below)

## 2017-07-20 NOTE — Telephone Encounter (Signed)
Patient has returned call. She continues to have chest pain since last office with Dr. Excell Seltzer in 04/2017. No excerebration. DASI is 5.07 Mets. She walks about 30 minutes at very brisk place and frequent stop. No chest pain. Just stable chronic dyspnea.   Dr. Excell Seltzer, Is it ok to clear the patient? Please forward your response to P CV DIV PREOP. Thank you  Manson Passey, PA 07/20/2017, 4:04 PM

## 2017-07-20 NOTE — Telephone Encounter (Signed)
   [facility] Pre-operative Risk Assessment    Request for surgical clearance:  1. What type of surgery is being performed?  Cataract extraction w/Intraocular lens implantation of the RIGHT EYE/ LEFT EYE, followed by the RIGHT EYE/ LEFT EYE    2. When is this surgery scheduled? 07/31/17   3. What type of clearance is required (medical clearance vs. Pharmacy clearance to hold med vs. Both)? Medical Clearance  4. Are there any medications that need to be held prior to surgery and how long?  None    5. Practice name and name of physician performing surgery? [facility], [place]   6. What is your office phone number [phone]    7.   What is your office fax number [phone]  8.   Anesthesia type (None, local, MAC, general) ? Topical anesthesia w/IV medication    [name] 07/20/2017, 12:09 PM  _________________________________________________________________   (provider comments below)

## 2017-07-20 NOTE — Telephone Encounter (Signed)
Patient understands repeat labs are not needed.

## 2017-07-21 NOTE — Telephone Encounter (Signed)
   Primary Cardiologist: Tonny Bollman, MD  Chart reviewed as part of pre-operative protocol coverage. Given past medical history and time since last visit, based on ACC/AHA guidelines, [name] would be at acceptable risk for the planned procedure without further cardiovascular testing. Dr. Excell Seltzer has reviewed.   I will route this recommendation to the requesting party via Epic fax function and remove from pre-op pool.  Please call with questions.  Nada Boozer, NP 07/21/2017, 1:53 PM

## 2017-07-21 NOTE — Telephone Encounter (Signed)
She can proceed. thanks

## 2017-08-13 ENCOUNTER — Ambulatory Visit: Payer: Medicare Other | Admitting: Cardiovascular Disease

## 2017-08-31 ENCOUNTER — Ambulatory Visit: Payer: Medicare Other | Admitting: Cardiovascular Disease

## 2017-08-31 ENCOUNTER — Encounter (INDEPENDENT_AMBULATORY_CARE_PROVIDER_SITE_OTHER): Payer: Self-pay

## 2017-08-31 ENCOUNTER — Encounter: Payer: Self-pay | Admitting: Cardiovascular Disease

## 2017-08-31 VITALS — BP 82/50 | HR 79 | Ht 63.0 in | Wt 131.8 lb

## 2017-08-31 DIAGNOSIS — I25119 Atherosclerotic heart disease of native coronary artery with unspecified angina pectoris: Secondary | ICD-10-CM

## 2017-08-31 DIAGNOSIS — I952 Hypotension due to drugs: Secondary | ICD-10-CM | POA: Diagnosis not present

## 2017-08-31 NOTE — Progress Notes (Signed)
Cardiology Office Note Date:  08/31/2017   ID:  [name], DOB 04-29-1935, MRN [id number]  PCP:  Laurann Montana, MD  Cardiologist:  Tonny Bollman, MD    Chief Complaint  Patient presents with  . Fatigue     History of Present Illness: [name] is a 82 y.o. female who presents for follow-up of coronary artery disease, hypertension, and chronic diastolic heart failure.  The patient is here alone today.  She complains of generalized fatigue.  She denies lightheadedness or syncope.  She brings in a medicine list that includes both amlodipine and Lotrel.  There seems to be some confusion about her antihypertensive medications.  We have not had Lotrel on her medicine list over any recent office visits.  She reports no recent chest pain.  Shortness of breath with activity is unchanged.  No edema, orthopnea, or PND.  Primarily just complains of feeling "washed out."   Past Medical History:  Diagnosis Date  . Abnormal nuclear stress test 05/24/2015  . Atypical ductal hyperplasia of right breast 02/22/2014  . C. difficile colitis - recurrent 06/23/2016  . C. difficile diarrhea on po vanc 10/04/2015  . CAD (coronary artery disease) 2006, 2016   a. 08/2014 NSTEMI: Attempted PCI of 85% RPDA - unable to wire, complicated by dissection, case aborted;  b. 08/2014 Relook Cath: LM nl, LAD 60p, SP1 90ost (small), D1 65ost, RI 45/50, RCA nl, RPDA 90 - healed dissection;  b. 08/2014 Echo: EF 55%, mild/mon midapical inferior HK, mild AI, PASP . c. Cath 05/2015 with CTO of the right-PDA and unsuccessful PCI  . CAD S/P unsuccessful PCI 08/18/14 04/20/2007  . chronic back   . CKD (chronic kidney disease), stage II   . COLONIC POLYPS, ADENOMATOUS, HX OF   . Diverticulitis   . Dyslipidemia    a. statin intolerant.  [name] Dyspnea    with exertion  . Elevated troponin level 10/04/2015  . ENDOMETRIOSIS   . Essential hypertension   . Fibromyalgia   . GASTRIC ANTRAL VASCULAR ECTASIA   . GERD   . History of  hiatal hernia   . Hypertension, uncontrolled 10/04/2015  . Insomnia   . IRON DEFICIENCY ANEMIA SECONDARY TO BLOOD LOSS   . Myocardial infarction (HCC) 08/2014  . Neuropathy   . NSTEMI (non-ST elevated myocardial infarction) (HCC) 08/18/2014  . Osteoarthritis    "back, hips, basically all over" (05/24/2015)  . Palpitations 03/06/2011  . Paresthesia 08/01/2014  . Raynaud's syndrome   . Rosacea, acne   . Sicca syndrome (HCC)   . SJOGREN'S SYNDROME   . SUPRAVENTRICULAR TACHYCARDIA   . Vitamin D deficiency     Past Surgical History:  Procedure Laterality Date  . ABDOMINAL HYSTERECTOMY     left right ovary  . APPENDECTOMY    . BREAST EXCISIONAL BIOPSY Right   . BREAST EXCISIONAL BIOPSY Right   . BREAST LUMPECTOMY WITH RADIOACTIVE SEED LOCALIZATION Right 01/24/2014   Procedure: BREAST LUMPECTOMY WITH RADIOACTIVE SEED LOCALIZATION;  Surgeon: Avel Peace, MD;  Location: [facility];  Service: General;  Laterality: Right;  . CARDIAC CATHETERIZATION N/A 08/18/2014   Procedure: Left Heart Cath and Coronary Angiography;  Surgeon: Marykay Lex, MD;  Location[facility] CV LAB;  Service: Cardiovascular;  Laterality: N/A;  . CARDIAC CATHETERIZATION  08/18/2014   Procedure: Coronary Balloon Angioplasty;  Surgeon: Marykay Lex, MD;  Location: [facility] INVASIVE CV LAB;  Service: Cardiovascular;;  . CARDIAC CATHETERIZATION N/A 08/21/2014   Procedure: Left  Heart Cath and Coronary Angiography;  Surgeon: Marykay Lex, MD;  Location: [facility] INVASIVE CV LAB;  Service: Cardiovascular;  Laterality: N/A;  . CARDIAC CATHETERIZATION  2006  . CARDIAC CATHETERIZATION N/A 05/24/2015   Procedure: Left Heart Cath and Coronary Angiography;  Surgeon: Tonny Bollman, MD;  Location: [facility] INVASIVE CV LAB;  Service: Cardiovascular;  Laterality: N/A;  . COLONOSCOPY W/ BIOPSIES AND POLYPECTOMY  12/17/2006   adenomatous polyps, external hemorrhoids  . ESOPHAGOGASTRODUODENOSCOPY  11/27/2009   APC tretment of lesions  .  EYE SURGERY    . HERNIA REPAIR    . HIATAL HERNIA REPAIR  2005   and paraesophageal  . LAPAROSCOPIC CHOLECYSTECTOMY    . TEAR DUCT PROBING Left    "no plugs or anything"    Current Outpatient Medications  Medication Sig Dispense Refill  . amLODipine (NORVASC) 2.5 MG tablet Take 2.5 mg by mouth daily.    [name] amLODipine-benazepril (LOTREL) 5-10 MG capsule Take 1 capsule by mouth daily.    [name] aspirin 81 MG tablet Take 81 mg by mouth daily.    . Biotin 10 MG TABS Take 10 mg by mouth daily.    . calcium carbonate (ANTACID) 420 MG CHEW chewable tablet Chew 420 mg by mouth daily.    . clopidogrel (PLAVIX) 75 MG tablet Take 1 tablet (75 mg total) by mouth daily with breakfast. 90 tablet 2  . cyclobenzaprine (FLEXERIL) 10 MG tablet Take 5-10 mg 3 (three) times daily as needed by mouth for muscle spasms.     . famotidine (PEPCID) 20 MG tablet Take 1 tablet (20 mg total) by mouth daily. 30 tablet 1  . feeding supplement, ENSURE ENLIVE, (ENSURE ENLIVE) LIQD Take 237 mLs by mouth 2 (two) times daily between meals.    . ferrous sulfate 325 (65 FE) MG tablet Take 325 mg by mouth daily with breakfast. Stopped 11/08/16    . fluticasone (FLONASE ALLERGY RELIEF) 50 MCG/ACT nasal spray Place 1 spray into the nose daily as needed for allergies.    . furosemide (LASIX) 20 MG tablet Take 1 tablet (20 mg total) by mouth daily. 90 tablet 3  . isosorbide mononitrate (IMDUR) 30 MG 24 hr tablet TAKE ONE TABLET BY MOUTH ONCE DAILY 90 tablet 3  . loratadine (CLARITIN) 10 MG tablet Take 10 mg by mouth daily.    . metoCLOPramide (REGLAN) 10 MG tablet Take 1 tablet (10 mg total) by mouth 2 (two) times daily. 2 tablet 0  . metoprolol succinate (TOPROL-XL) 25 MG 24 hr tablet Take 12.5 mg by mouth daily.    . Multiple Vitamins-Minerals (EMERGEN-C VITAMIN C PO) Take 1 tablet by mouth daily.    . nitroGLYCERIN (NITROSTAT) 0.4 MG SL tablet Place 1 tablet (0.4 mg total) under the tongue every 5 (five) minutes as needed for chest  pain. 25 tablet 1  . polyethylene glycol powder (MIRALAX) powder Take 1 Container by mouth once.    . Polyvinyl Alcohol-Povidone (REFRESH OP) Place 1 drop into both eyes 2 (two) times daily.     [name] saccharomyces boulardii (FLORASTOR) 250 MG capsule Take 250 mg by mouth 2 (two) times daily.    . temazepam (RESTORIL) 15 MG capsule Take 15 mg by mouth at bedtime as needed for sleep. SLEEP AID    . traZODone (DESYREL) 50 MG tablet Take half (1/2) to one (1) tablet (25 mg to 50 mg total) by mouth daily at bedtime as needed for sleep.     No current facility-administered medications for  this visit.    Allergies:   Ezetimibe; Ezetimibe-simvastatin; Morphine; Rosuvastatin; Ambien [zolpidem tartrate]; Plaquenil [hydroxychloroquine sulfate]; Polytrim [polymyxin b-trimethoprim]; and Prevacid [lansoprazole]   Social History:  The patient  reports that she has never smoked. She has never used smokeless tobacco. She reports that she drinks alcohol. She reports that she does not use drugs.   Family History:  The patient's family history includes Breast cancer in her daughter, sister, sister, and sister; Cancer in her daughter and sister; Coronary artery disease in her brother, brother, brother, father, and mother; Diabetes in her mother; Heart attack in her father; Heart disease in her brother, father, and mother; Heart failure in her mother; Hyperlipidemia in her mother; Hypertension in her father and mother; Kidney failure in her mother; Peripheral vascular disease in her mother.   ROS:  Please see the history of present illness.  Otherwise, review of systems is positive for dizziness.  All other systems are reviewed and negative.   PHYSICAL EXAM: VS:  BP (!) 82/50   Pulse 79   Ht 5\' 3"  (1.6 m)   Wt 131 lb 12.8 oz (59.8 kg)   SpO2 97%   BMI 23.35 kg/m  , BMI Body mass index is 23.35 kg/m. GEN: Well nourished, well developed, in no acute distress  HEENT: normal  Neck: no JVD, no masses. No carotid  bruits Cardiac: RRR with 2/6 SEM at the RUSB Respiratory:  clear to auscultation bilaterally, normal work of breathing GI: soft, nontender, nondistended, + BS MS: no deformity or atrophy  Ext: no pretibial edema, pedal pulses 2+= bilaterally Skin: warm and dry, no rash Neuro:  Strength and sensation are intact Psych: euthymic mood, full affect  EKG:  EKG is not ordered today.  Recent Labs: 01/30/2017: ALT 13; Hemoglobin 12.6; Platelets 197 05/01/2017: BUN 11; Creatinine, Ser 0.90; NT-Pro BNP 1,000; Potassium 4.6; Sodium 135   Lipid Panel     Component Value Date/Time   CHOL 162 08/19/2014 0255   TRIG 98 08/19/2014 0255   HDL 54 08/19/2014 0255   CHOLHDL 3.0 08/19/2014 0255   VLDL 20 08/19/2014 0255   LDLCALC 88 08/19/2014 0255      Wt Readings from Last 3 Encounters:  08/31/17 131 lb 12.8 oz (59.8 kg)  05/01/17 125 lb 12.8 oz (57.1 kg)  01/30/17 124 lb (56.2 kg)     Cardiac Studies Reviewed: Echo 05/11/2017: Study Conclusions  - Left ventricle: The cavity size was normal. Wall thickness was   normal. Systolic function was normal. The estimated ejection   fraction was in the range of 55% to 60%. Wall motion was normal;   there were no regional wall motion abnormalities. Features are   consistent with a pseudonormal left ventricular filling pattern,   with concomitant abnormal relaxation and increased filling   pressure (grade 2 diastolic dysfunction). - Aortic valve: There was mild stenosis. There was mild to moderate   regurgitation. Valve area (VTI): 1.6 cm^2. Valve area (Vmax):   1.32 cm^2. Valve area (Vmean): 1.23 cm^2. - Pulmonary arteries: Systolic pressure was moderately increased.   PA peak pressure: 56 mm Hg (S).  ASSESSMENT AND PLAN: 1.  Coronary artery disease, native vessel, without angina: She will continue on aspirin and clopidogrel.  2.  Hypotension: Suspect iatrogenic related to antihypertensive medications.  She does not appear acutely ill.  There  is confusion about which medication she is actually taking.  She brings in a list today but does not have her pill bottles.  I  asked her to call in when she gets home so that we can review her medications.  For now she is asked to hold all of her blood pressure lowering medicines until we can straighten this out.  3.  Hyperlipidemia: Intolerant to statin drugs and Zetia.  4.  Chronic diastolic heart failure: No evidence of volume overload.  Her breathing is stable.  She has New York Heart Association functional class II symptoms.  Current medicines are reviewed with the patient today.  The patient does not have concerns regarding medicines.  Labs/ tests ordered today include:  No orders of the defined types were placed in this encounter.   Disposition:   FU 2 weeks Pharm-D blood pressure clinic for BP recheck after medication adjustments.   Signed, Tonny Bollman, MD  08/31/2017 5:43 PM    [facility] [address] [place], [place], [place]  [address] Phone: [phone]; Fax: [phone]

## 2017-08-31 NOTE — Patient Instructions (Signed)
Medication Instructions:  Please call the office to confirm your medications when you get home. Then we will make appropriate medication recommendations.   Labwork: None  Testing/Procedures: None  Follow-Up: Your provider wants you to follow-up in: 6 months with Dr. Excell Seltzer. You will receive a reminder letter in the mail two months in advance. If you don't receive a letter, please call our office to schedule the follow-up appointment.    Any Other Special Instructions Will Be Listed Below (If Applicable).     If you need a refill on your cardiac medications before your next appointment, please call your pharmacy.

## 2017-09-02 ENCOUNTER — Telehealth: Payer: Self-pay

## 2017-09-02 NOTE — Telephone Encounter (Signed)
Patient was instructed at OV 6/17 to call the next morning prior to taking medications to confirm the meds she was taking. She did not call. Called to confirm meds she is actually taking so Dr. Excell Seltzer can review and make appropriate changes.  Left message to call back and confirm medications.

## 2017-09-02 NOTE — Telephone Encounter (Signed)
Please advise her to stop lotrel. thanks

## 2017-09-02 NOTE — Telephone Encounter (Signed)
Patient called and confirmed medications she is taking. Below are the cardiac medications she is taking: Norvasc 2.5 mg daily Lotrel 5-10 mg daily ASA 81 mg daily Lasix 20 mg daily Plavix 75 mg daily Imdur 30 mg daily NTG SL as needed  She has no complaints today. She has not checked her BP (she only checks her BP when she does not feel well).  She is also not taking several other medications. Medication list updated for viewing.

## 2017-09-03 NOTE — Telephone Encounter (Signed)
Spoke with pt and gave her Dr Earmon Phoenix recommendation of stopping Lotrel. Pt verbalized understanding and had no additional questions. Medication list updated.

## 2017-09-18 ENCOUNTER — Ambulatory Visit
Admission: RE | Admit: 2017-09-18 | Discharge: 2017-09-18 | Disposition: A | Discharge status: Home / Self Care | Payer: Medicare Other | Source: Ambulatory Visit | Attending: Physician Assistant | Admitting: Physician Assistant

## 2017-09-18 ENCOUNTER — Other Ambulatory Visit: Payer: Self-pay | Admitting: Physician Assistant

## 2017-09-18 DIAGNOSIS — M546 Pain in thoracic spine: Secondary | ICD-10-CM

## 2017-10-21 ENCOUNTER — Other Ambulatory Visit: Payer: Self-pay | Admitting: Cardiovascular Disease

## 2017-12-18 ENCOUNTER — Other Ambulatory Visit: Payer: Self-pay | Admitting: Family Medicine

## 2017-12-18 DIAGNOSIS — N6310 Unspecified lump in the right breast, unspecified quadrant: Secondary | ICD-10-CM

## 2017-12-23 ENCOUNTER — Ambulatory Visit: Payer: Medicare Other

## 2017-12-23 ENCOUNTER — Ambulatory Visit
Admission: RE | Admit: 2017-12-23 | Discharge: 2017-12-23 | Disposition: A | Discharge status: Home / Self Care | Payer: Medicare Other | Source: Ambulatory Visit | Attending: Family Medicine | Admitting: Family Medicine

## 2017-12-23 DIAGNOSIS — N6310 Unspecified lump in the right breast, unspecified quadrant: Secondary | ICD-10-CM

## 2018-01-22 ENCOUNTER — Telehealth: Payer: Self-pay

## 2018-01-22 NOTE — Telephone Encounter (Signed)
   [facility] Pre-operative Risk Assessment    Request for surgical clearance:  1. What type of surgery is being performed?  EXTRACTION OF 2 TEETH   2. When is this surgery scheduled?  TBD   3. What type of clearance is required (medical clearance vs. Pharmacy clearance to hold med vs. Both)?    4. Are there any medications that need to be held prior to surgery and how long?   5. Practice name and name of physician performing surgery?  [facility]   6. What is your office phone number [phone]    7.   What is your office fax number [phone]  8.   Anesthesia type (None, local, MAC, general) ?    Legrand Como  Charl Wellen 01/22/2018, 10:32 AM  _________________________________________________________________   (provider comments below)

## 2018-01-22 NOTE — Telephone Encounter (Signed)
Spoke with [employer] @ [facility].  She states that their providers never ask pt to stop any medications. Pt will have IV Sedation

## 2018-01-22 NOTE — Telephone Encounter (Signed)
   Need more info. Callback, please call dental office. Any request to hold meds? What kind of anesthesia? If simple dental extraction with local anesthesia, this will be a straightforward clearance.  Laurann Montana, PA-C 01/22/2018, 12:24 PM

## 2018-01-22 NOTE — Telephone Encounter (Signed)
   Primary Cardiologist: Tonny Bollman, MD  Chart reviewed as part of pre-operative protocol coverage. Simple dental extractions are considered low risk procedures per guidelines and generally do not require any specific cardiac clearance. It is also generally accepted that for simple extractions and dental cleanings, there is no need to interrupt blood thinner therapy.   SBE prophylaxis is not required for the patient.  I will route this recommendation to the requesting party via Epic fax function and remove from pre-op pool.  Please call with questions.  Laurann Montana, PA-C 01/22/2018, 1:00 PM

## 2018-03-04 IMAGING — CT CT ABD-PELV W/ CM
2 of 5 series · 17 of 46 positions shown, 19 images · IV contrast (ISOVUE)
Comparison: 02/20/2006

CLINICAL DATA: Weakness and recent diagnosis of C. difficile
infection

EXAM:
CT ABDOMEN AND PELVIS WITH CONTRAST
TECHNIQUE: Multidetector CT imaging of the abdomen and pelvis was performed
using the standard protocol following bolus administration of
intravenous contrast.
CONTRAST:  100mL BSODO2-0GG IOPAMIDOL (BSODO2-0GG) INJECTION 61%

[Series 2: abd/pel with · axial · 0.74mm/px · z∈[-330,+5]mm · 14 of 77 slices shown, 16 images]
[im 5/77  soft-tissue]
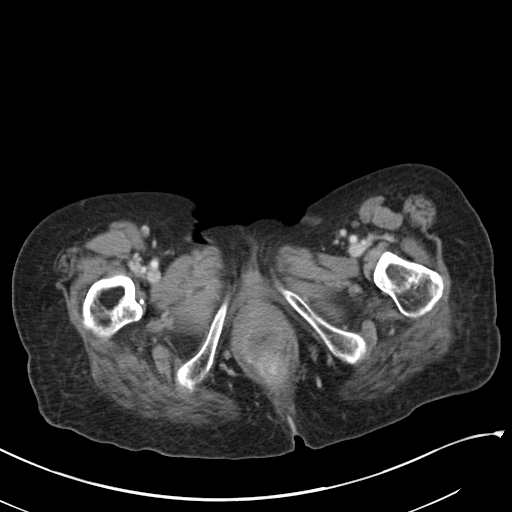
[im 5/77  bone]
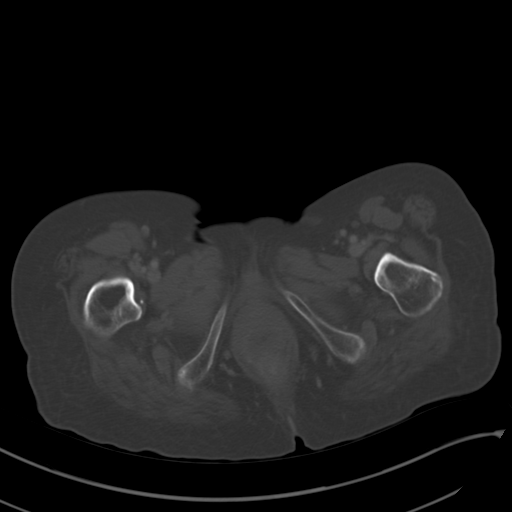
[im 10/77  soft-tissue]
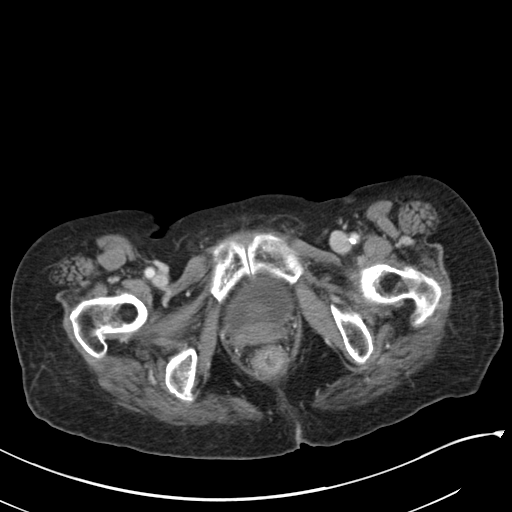
[im 15/77  soft-tissue]
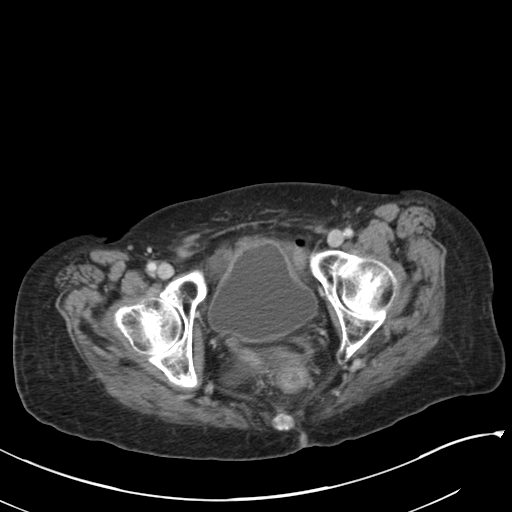
[im 20/77  soft-tissue]
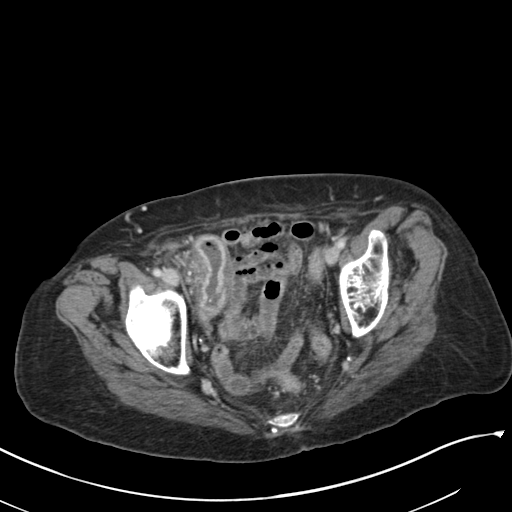
[im 24/77  soft-tissue]
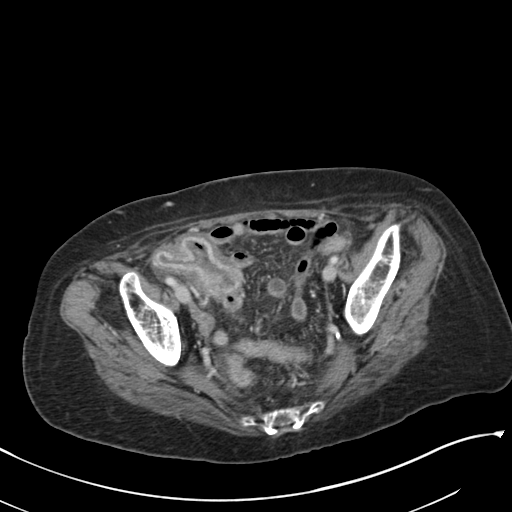
[im 29/77  soft-tissue]
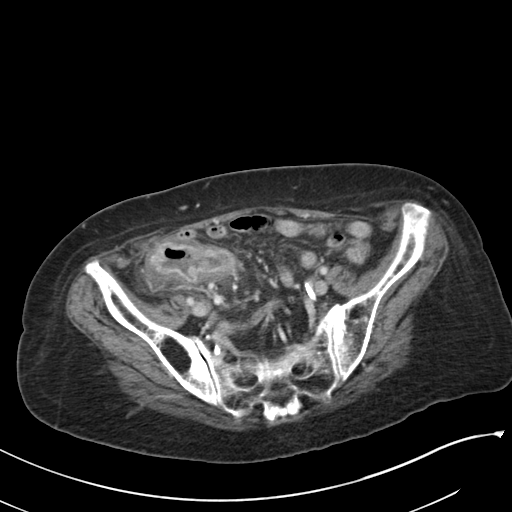
[im 34/77  soft-tissue]
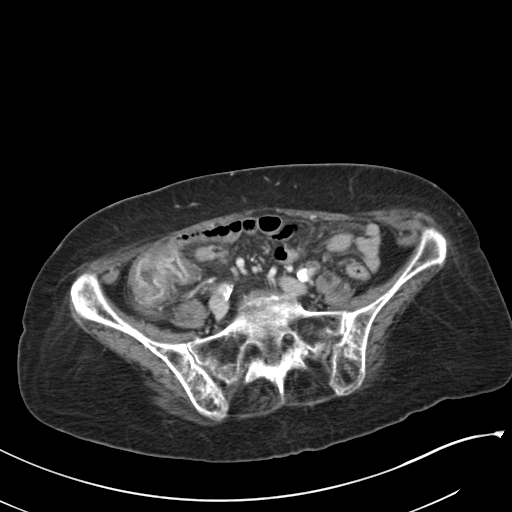
[im 43/77  soft-tissue]
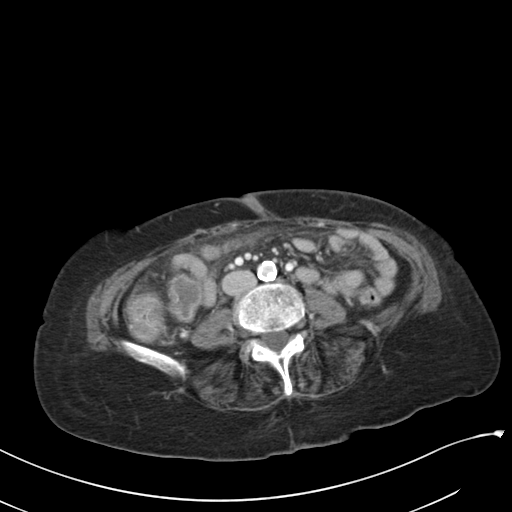
[im 48/77  soft-tissue]
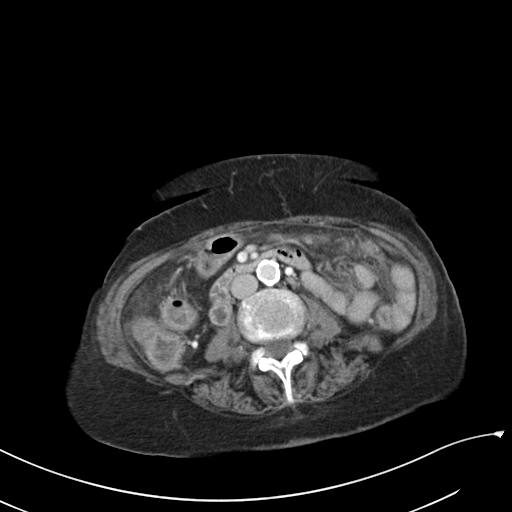
[im 48/77  bone]
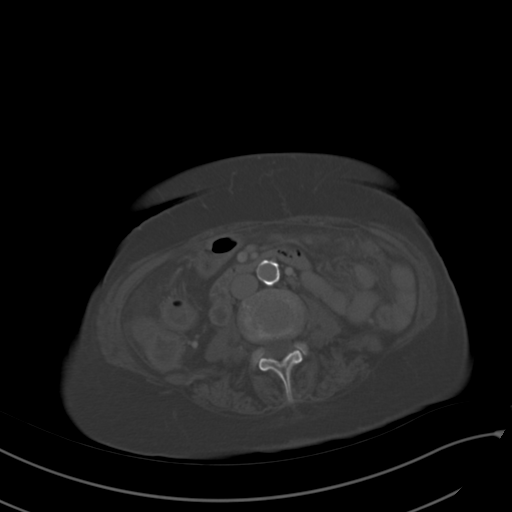
[im 53/77  soft-tissue]
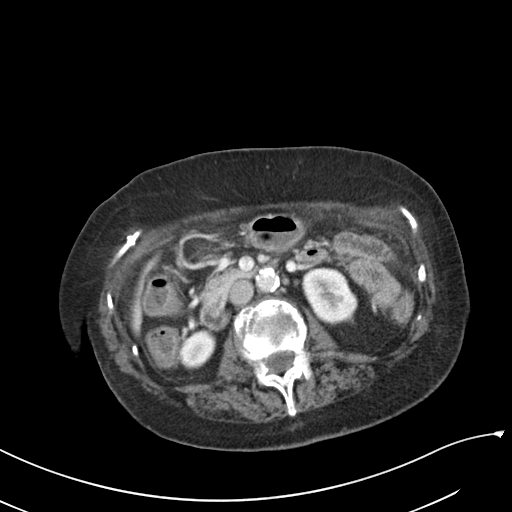
[im 58/77  soft-tissue]
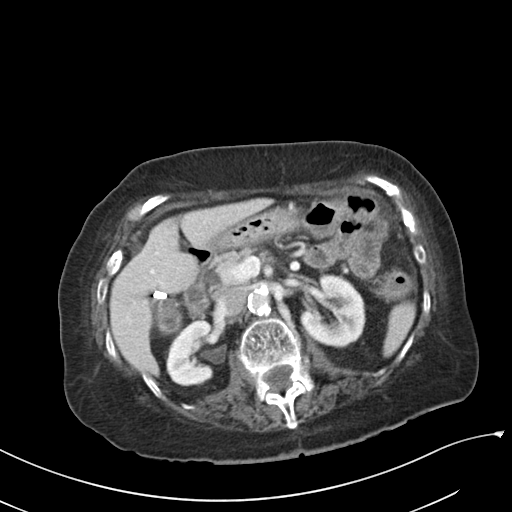
[im 62/77  soft-tissue]
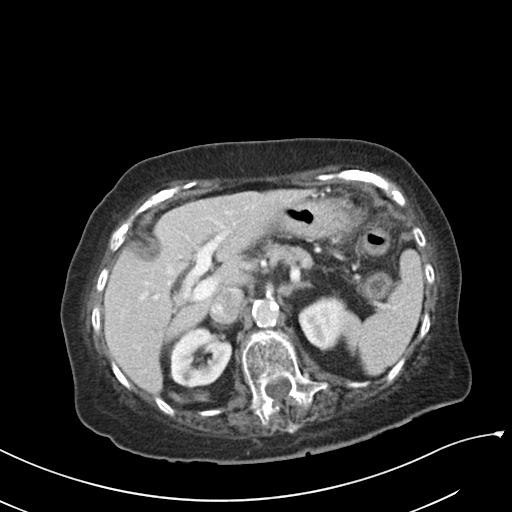
[im 67/77  soft-tissue]
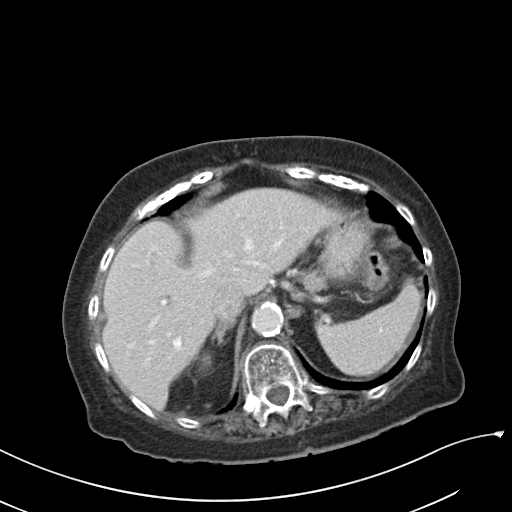
[im 72/77  soft-tissue]
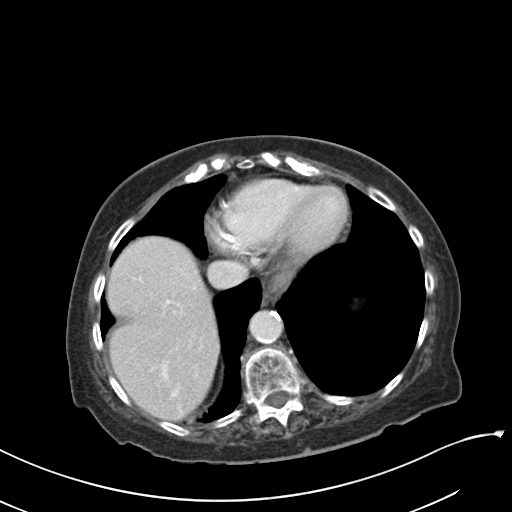

[Series 3: coronal a/|p · coronal · 0.81mm/px · 3 of 114 slices shown]
[im 38/114  soft-tissue]
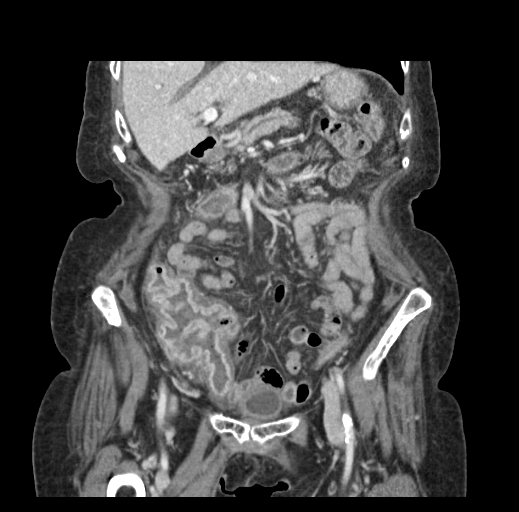
[im 51/114  soft-tissue]
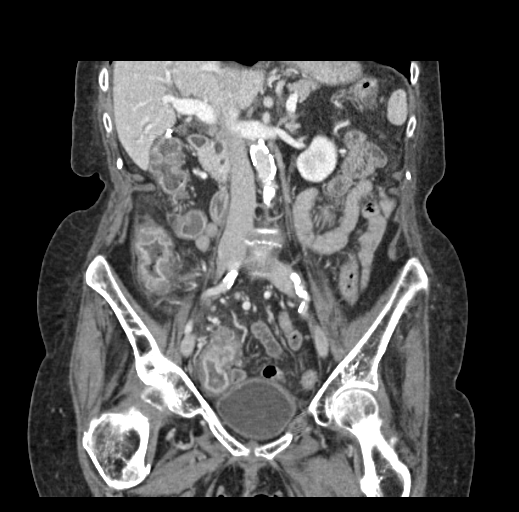
[im 63/114  soft-tissue]
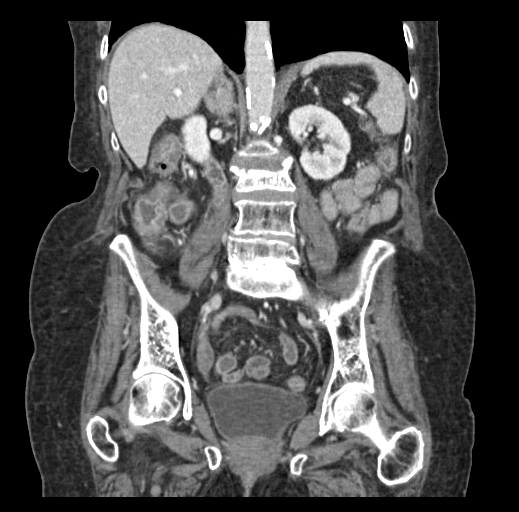

[17 of 46 positions shown; findings below may reference images not displayed]

FINDINGS: Lower chest: No acute abnormality.

Hepatobiliary: No focal liver abnormality is seen. Status post
cholecystectomy. No biliary dilatation.

Pancreas: Unremarkable. No pancreatic ductal dilatation or
surrounding inflammatory changes.

Spleen: Normal in size without focal abnormality.

Adrenals/Urinary Tract: Adrenal glands are within normal limits. A
small right renal cyst is seen. No calculi or obstructive changes
are noted. The bladder is well distended.

Stomach/Bowel: The colon is predominately decompressed with
enhancing mucosa consistent with inflammatory changes well as some
pericolonic inflammatory change. No obstructive changes are seen.
These findings are consistent with the given clinical history. The
appendix has been surgically removed.

Vascular/Lymphatic: Aortic atherosclerosis. No enlarged abdominal or
pelvic lymph nodes.

Reproductive: Status post hysterectomy. No adnexal masses.

Other: No abdominal wall hernia or abnormality. No abdominopelvic
ascites.

Musculoskeletal: L1 and L3 compression deformities are noted with
overall chronic appearance. Diffuse osteopenia is noted.
IMPRESSION: Inflammatory changes in the colon predominately in the right colon
consistent with the given clinical history of C. difficile
infection.

L1 and L3 compression deformities likely of a chronic nature given
their appearance.

No other focal abnormality is noted.

## 2018-04-12 ENCOUNTER — Telehealth: Payer: Self-pay | Admitting: Cardiovascular Disease

## 2018-04-12 NOTE — Telephone Encounter (Signed)
New message   Pt c/o of Chest Pain: STAT if CP now or developed within 24 hours  1. Are you having CP right now? No, but did last night   2. Are you experiencing any other symptoms (ex. SOB, nausea, vomiting, sweating)? SOB (can hear on phone while talking to pt)   3. How long have you been experiencing CP? Last night   4. Is your CP continuous or coming and going?  CP was continuous while she had it, also had left arm pain. PT says it still feels strange    5. Have you taken Nitroglycerin? Yes, last night. She said she may have took two   PT would like to only see Dr Excell Seltzer  ?

## 2018-04-12 NOTE — Telephone Encounter (Signed)
Spoke with patient who called because she has been experiencing CP and SOB quite a bit lately.  She said that last night 1/26, she had CP, took 2 Nitro and had relief.  While talking with her this morning, she is SOB, but has no CP.  I scheduled an appt with Dr Excell Seltzer 1/29 @ 2:20 pm.  She verbalized understanding.

## 2018-04-14 ENCOUNTER — Ambulatory Visit: Payer: Medicare Other | Admitting: Cardiovascular Disease

## 2018-04-14 ENCOUNTER — Encounter: Payer: Self-pay | Admitting: Cardiovascular Disease

## 2018-04-14 VITALS — BP 130/68 | HR 68 | Ht 63.0 in | Wt 121.4 lb

## 2018-04-14 DIAGNOSIS — I5032 Chronic diastolic (congestive) heart failure: Secondary | ICD-10-CM

## 2018-04-14 DIAGNOSIS — I25119 Atherosclerotic heart disease of native coronary artery with unspecified angina pectoris: Secondary | ICD-10-CM | POA: Diagnosis not present

## 2018-04-14 DIAGNOSIS — R0602 Shortness of breath: Secondary | ICD-10-CM | POA: Diagnosis not present

## 2018-04-14 NOTE — Patient Instructions (Addendum)
Medication Instructions:  Your provider recommends that you continue on your current medications as directed. Please refer to the Current Medication list given to you today.    Labwork: TODAY: BMET, BNP, CBC  Testing/Procedures: Your provider has requested that you have an echocardiogram. Echocardiography is a painless test that uses sound waves to create images of your heart. It provides your doctor with information about the size and shape of your heart and how well your heart's chambers and valves are working. This procedure takes approximately one hour. There are no restrictions for this procedure. You are scheduled April 19, 2018. Please arrive at 1:30PM for your echocardiogram.  Follow-Up: Your provider wants you to follow-up in: 6 months with Dr. Excell Seltzer.  You will receive a reminder letter in the mail two months in advance. If you don't receive a letter, please call our office to schedule the follow-up appointment.

## 2018-04-14 NOTE — Progress Notes (Signed)
Cardiology Office Note:    Date:  04/16/2018   ID:  [name], DOB March 23, 1935, MRN [id number]  PCP:  Laurann Montana, MD  Cardiologist:  Tonny Bollman, MD  Electrophysiologist:  None   Referring MD: Laurann Montana, MD   Chief Complaint  Patient presents with  . Shortness of Breath   History of Present Illness:    [name] is a 83 y.o. female with a hx of coronary artery disease, hypertension, and chronic diastolic heart failure, presenting for follow-up evaluation.  The patient is here alone today.  She continues to have a multitude of complaints.  She feels tired all of the time.  She is moderately short of breath with activity.  She is had no recent chest pain or pressure.  Leg swelling is unchanged.  No orthopnea or PND.  She complains of feeling "washed out."  Past Medical History:  Diagnosis Date  . Abnormal nuclear stress test 05/24/2015  . Atypical ductal hyperplasia of right breast 02/22/2014  . C. difficile colitis - recurrent 06/23/2016  . C. difficile diarrhea on po vanc 10/04/2015  . CAD (coronary artery disease) 2006, 2016   a. 08/2014 NSTEMI: Attempted PCI of 85% RPDA - unable to wire, complicated by dissection, case aborted;  b. 08/2014 Relook Cath: LM nl, LAD 60p, SP1 90ost (small), D1 65ost, RI 45/50, RCA nl, RPDA 90 - healed dissection;  b. 08/2014 Echo: EF 55%, mild/mon midapical inferior HK, mild AI, PASP . c. Cath 05/2015 with CTO of the right-PDA and unsuccessful PCI  . CAD S/P unsuccessful PCI 08/18/14 04/20/2007  . chronic back   . CKD (chronic kidney disease), stage II   . COLONIC POLYPS, ADENOMATOUS, HX OF   . Diverticulitis   . Dyslipidemia    a. statin intolerant.  [name] Dyspnea    with exertion  . Elevated troponin level 10/04/2015  . ENDOMETRIOSIS   . Essential hypertension   . Fibromyalgia   . GASTRIC ANTRAL VASCULAR ECTASIA   . GERD   . History of hiatal hernia   . Hypertension, uncontrolled 10/04/2015  . Insomnia   . IRON DEFICIENCY ANEMIA  SECONDARY TO BLOOD LOSS   . Myocardial infarction (HCC) 08/2014  . Neuropathy   . NSTEMI (non-ST elevated myocardial infarction) (HCC) 08/18/2014  . Osteoarthritis    "back, hips, basically all over" (05/24/2015)  . Palpitations 03/06/2011  . Paresthesia 08/01/2014  . Raynaud's syndrome   . Rosacea, acne   . Sicca syndrome (HCC)   . SJOGREN'S SYNDROME   . SUPRAVENTRICULAR TACHYCARDIA   . Vitamin D deficiency     Past Surgical History:  Procedure Laterality Date  . ABDOMINAL HYSTERECTOMY     left right ovary  . APPENDECTOMY    . BREAST EXCISIONAL BIOPSY Right   . BREAST EXCISIONAL BIOPSY Right   . BREAST LUMPECTOMY WITH RADIOACTIVE SEED LOCALIZATION Right 01/24/2014   Procedure: BREAST LUMPECTOMY WITH RADIOACTIVE SEED LOCALIZATION;  Surgeon: Avel Peace, MD;  Location: [facility];  Service: General;  Laterality: Right;  . CARDIAC CATHETERIZATION N/A 08/18/2014   Procedure: Left Heart Cath and Coronary Angiography;  Surgeon: Marykay Lex, MD;  Location: [facility] INVASIVE CV LAB;  Service: Cardiovascular;  Laterality: N/A;  . CARDIAC CATHETERIZATION  08/18/2014   Procedure: Coronary Balloon Angioplasty;  Surgeon: Marykay Lex, MD;  Location: [facility] INVASIVE CV LAB;  Service: Cardiovascular;;  . CARDIAC CATHETERIZATION N/A 08/21/2014   Procedure: Left Heart Cath and Coronary Angiography;  Surgeon: Marykay Lex,  MD;  Location: [facility] INVASIVE CV LAB;  Service: Cardiovascular;  Laterality: N/A;  . CARDIAC CATHETERIZATION  2006  . CARDIAC CATHETERIZATION N/A 05/24/2015   Procedure: Left Heart Cath and Coronary Angiography;  Surgeon: Tonny Bollman, MD;  Location: [facility] INVASIVE CV LAB;  Service: Cardiovascular;  Laterality: N/A;  . COLONOSCOPY W/ BIOPSIES AND POLYPECTOMY  12/17/2006   adenomatous polyps, external hemorrhoids  . ESOPHAGOGASTRODUODENOSCOPY  11/27/2009   APC tretment of lesions  . EYE SURGERY    . HERNIA REPAIR    . HIATAL HERNIA REPAIR  2005   and paraesophageal  .  LAPAROSCOPIC CHOLECYSTECTOMY    . TEAR DUCT PROBING Left    "no plugs or anything"    Current Medications: Current Meds  Medication Sig  . amLODipine (NORVASC) 2.5 MG tablet Take 2.5 mg by mouth daily.  [name] aspirin 81 MG tablet Take 81 mg by mouth daily.  . calcium carbonate (ANTACID) 420 MG CHEW chewable tablet Chew 420 mg by mouth daily.  . clopidogrel (PLAVIX) 75 MG tablet TAKE 1 TABLET BY MOUTH ONCE DAILY WITH BREAKFAST  . famotidine (PEPCID) 20 MG tablet Take 1 tablet (20 mg total) by mouth daily.  . feeding supplement, ENSURE ENLIVE, (ENSURE ENLIVE) LIQD Take 237 mLs by mouth 2 (two) times daily between meals.  . ferrous sulfate 325 (65 FE) MG tablet Take 325 mg by mouth as needed. Stopped 11/08/16  . fluticasone (FLONASE ALLERGY RELIEF) 50 MCG/ACT nasal spray Place 1 spray into the nose daily as needed for allergies.  . furosemide (LASIX) 20 MG tablet Take 1 tablet (20 mg total) by mouth daily.  . isosorbide mononitrate (IMDUR) 30 MG 24 hr tablet TAKE ONE TABLET BY MOUTH ONCE DAILY  . loratadine (CLARITIN) 10 MG tablet Take 10 mg by mouth daily.  . Multiple Vitamins-Minerals (EMERGEN-C VITAMIN C PO) Take 1 tablet by mouth daily.  . nitroGLYCERIN (NITROSTAT) 0.4 MG SL tablet Place 1 tablet (0.4 mg total) under the tongue every 5 (five) minutes as needed for chest pain.  . Polyvinyl Alcohol-Povidone (REFRESH OP) Place 1 drop into both eyes 2 (two) times daily.   . temazepam (RESTORIL) 15 MG capsule Take 15 mg by mouth at bedtime as needed for sleep. SLEEP AID     Allergies:   Ezetimibe; Ezetimibe-simvastatin; Morphine; Rosuvastatin; Ambien [zolpidem tartrate]; Plaquenil [hydroxychloroquine sulfate]; Polytrim [polymyxin b-trimethoprim]; and Prevacid [lansoprazole]   Social History   Socioeconomic History  . Marital status: Married    Spouse name: Not on file  . Number of children: 3  . Years of education: college  . Highest education level: Not on file  Occupational History  .  Occupation: retired  Engineer, production  . Financial resource strain: Not on file  . Food insecurity:    Worry: Not on file    Inability: Not on file  . Transportation needs:    Medical: Not on file    Non-medical: Not on file  Tobacco Use  . Smoking status: Never Smoker  . Smokeless tobacco: Never Used  Substance and Sexual Activity  . Alcohol use: Yes    Comment: occasional wine  . Drug use: No  . Sexual activity: Not Currently  Lifestyle  . Physical activity:    Days per week: Not on file    Minutes per session: Not on file  . Stress: Not on file  Relationships  . Social connections:    Talks on phone: Not on file    Gets together: Not on file  Attends religious service: Not on file    Active member of club or organization: Not on file    Attends meetings of clubs or organizations: Not on file    Relationship status: Not on file  Other Topics Concern  . Not on file  Social History Narrative   Patient drinks caffeine occasionally.   Patient is right handed.     Family History: The patient's family history includes Breast cancer in her daughter, sister, sister, and sister; Cancer in her daughter and sister; Coronary artery disease in her brother, brother, brother, father, and mother; Diabetes in her mother; Heart attack in her father; Heart disease in her brother, father, and mother; Heart failure in her mother; Hyperlipidemia in her mother; Hypertension in her father and mother; Kidney failure in her mother; Peripheral vascular disease in her mother. There is no history of Colon cancer, Esophageal cancer, Stomach cancer, or Rectal cancer.  ROS:   Please see the history of present illness.    All other systems reviewed and are negative.  EKGs/Labs/Other Studies Reviewed:    The following studies were reviewed today: Echo 05/11/2017: Study Conclusions  - Left ventricle: The cavity size was normal. Wall thickness was   normal. Systolic function was normal. The estimated  ejection   fraction was in the range of 55% to 60%. Wall motion was normal;   there were no regional wall motion abnormalities. Features are   consistent with a pseudonormal left ventricular filling pattern,   with concomitant abnormal relaxation and increased filling   pressure (grade 2 diastolic dysfunction). - Aortic valve: There was mild stenosis. There was mild to moderate   regurgitation. Valve area (VTI): 1.6 cm^2. Valve area (Vmax):   1.32 cm^2. Valve area (Vmean): 1.23 cm^2. - Pulmonary arteries: Systolic pressure was moderately increased.   PA peak pressure: 56 mm Hg (S).  EKG:  EKG is ordered today.  The ekg ordered today demonstrates normal sinus rhythm 68 bpm, within normal limits.  Recent Labs: 04/14/2018: ALT 6; BUN 10; Creatinine, Ser 1.01; Hemoglobin 10.6; NT-Pro BNP 982; Platelets 193; Potassium 5.3; Sodium 133  Recent Lipid Panel    Component Value Date/Time   CHOL 162 08/19/2014 0255   TRIG 98 08/19/2014 0255   HDL 54 08/19/2014 0255   CHOLHDL 3.0 08/19/2014 0255   VLDL 20 08/19/2014 0255   LDLCALC 88 08/19/2014 0255    Physical Exam:    VS:  BP 130/68   Pulse 68   Ht 5\' 3"  (1.6 m)   Wt 121 lb 6.4 oz (55.1 kg)   SpO2 98%   BMI 21.51 kg/m     Wt Readings from Last 3 Encounters:  04/14/18 121 lb 6.4 oz (55.1 kg)  08/31/17 131 lb 12.8 oz (59.8 kg)  05/01/17 125 lb 12.8 oz (57.1 kg)    GEN:  Well nourished, well developed in no acute distress HEENT: Normal NECK: No JVD; No carotid bruits LYMPHATICS: No lymphadenopathy CARDIAC: RRR, 2/6 systolic murmur at the RUSB RESPIRATORY:  Clear to auscultation without rales, wheezing or rhonchi  ABDOMEN: Soft, non-tender, non-distended MUSCULOSKELETAL:  Trace bilateral pretibial edema; No deformity  SKIN: Warm and dry NEUROLOGIC:  Alert and oriented x 3 PSYCHIATRIC:  Normal affect   ASSESSMENT:    1. Chronic diastolic heart failure (HCC)   2. Shortness of breath   3. Coronary artery disease involving  native coronary artery of native heart with angina pectoris (HCC)    PLAN:    In order of  problems listed above:  1. The patient continues to have New York Heart Association functional class II-III symptoms of chronic diastolic heart failure.  She does not have physical exam evidence of volume excess.  However, her BNP has been elevated in the past and her echo has shown findings consistent with diastolic dysfunction.  She has not been taking furosemide regularly and I emphasized the importance of taking this as scheduled on a daily basis.  We discussed sodium restriction and daily weights.  I will see her back in 6 months. 2. Suspect multifactorial, primarily related to #1.  Will update an echocardiogram study. 3. No anginal symptoms.  Continue medical management with aspirin and clopidogrel, isosorbide and amlodipine for antianginal therapy.   Medication Adjustments/Labs and Tests Ordered: Current medicines are reviewed at length with the patient today.  Concerns regarding medicines are outlined above.  Orders Placed This Encounter  Procedures  . CBC with Differential/Platelet  . Comprehensive metabolic panel  . Pro b natriuretic peptide (BNP)  . EKG 12-Lead  . ECHOCARDIOGRAM COMPLETE   No orders of the defined types were placed in this encounter.   Patient Instructions  Medication Instructions:  Your provider recommends that you continue on your current medications as directed. Please refer to the Current Medication list given to you today.    Labwork: TODAY: BMET, BNP, CBC  Testing/Procedures: Your provider has requested that you have an echocardiogram. Echocardiography is a painless test that uses sound waves to create images of your heart. It provides your doctor with information about the size and shape of your heart and how well your heart's chambers and valves are working. This procedure takes approximately one hour. There are no restrictions for this procedure. You are  scheduled April 19, 2018. Please arrive at 1:30PM for your echocardiogram.  Follow-Up: Your provider wants you to follow-up in: 6 months with Dr. Excell Seltzerooper.  You will receive a reminder letter in the mail two months in advance. If you don't receive a letter, please call our office to schedule the follow-up appointment.        Signed, Tonny BollmanMichael Genella Bas, MD  04/16/2018 8:07 AM    [facility]

## 2018-04-15 LAB — CBC WITH DIFFERENTIAL/PLATELET
Basophils Absolute: 0 10*3/uL (ref 0.0–0.2)
Basos: 1 %
EOS (ABSOLUTE): 0.1 10*3/uL (ref 0.0–0.4)
Eos: 1 %
Hematocrit: 33.2 % — ABNORMAL LOW (ref 34.0–46.6)
Hemoglobin: 10.6 g/dL — ABNORMAL LOW (ref 11.1–15.9)
IMMATURE GRANULOCYTES: 0 %
Immature Grans (Abs): 0 10*3/uL (ref 0.0–0.1)
Lymphocytes Absolute: 1.1 10*3/uL (ref 0.7–3.1)
Lymphs: 21 %
MCH: 28.2 pg (ref 26.6–33.0)
MCHC: 31.9 g/dL (ref 31.5–35.7)
MCV: 88 fL (ref 79–97)
MONOS ABS: 0.7 10*3/uL (ref 0.1–0.9)
Monocytes: 13 %
Neutrophils Absolute: 3.6 10*3/uL (ref 1.4–7.0)
Neutrophils: 64 %
PLATELETS: 193 10*3/uL (ref 150–450)
RBC: 3.76 x10E6/uL — ABNORMAL LOW (ref 3.77–5.28)
RDW: 13.5 % (ref 11.7–15.4)
WBC: 5.6 10*3/uL (ref 3.4–10.8)

## 2018-04-15 LAB — COMPREHENSIVE METABOLIC PANEL
A/G RATIO: 1.5 (ref 1.2–2.2)
ALT: 6 IU/L (ref 0–32)
AST: 17 IU/L (ref 0–40)
Albumin: 3.9 g/dL (ref 3.7–4.7)
Alkaline Phosphatase: 50 IU/L (ref 39–117)
BUN/Creatinine Ratio: 10 — ABNORMAL LOW (ref 12–28)
BUN: 10 mg/dL (ref 8–27)
Bilirubin Total: 0.6 mg/dL (ref 0.0–1.2)
CO2: 20 mmol/L (ref 20–29)
Calcium: 9.4 mg/dL (ref 8.7–10.3)
Chloride: 99 mmol/L (ref 96–106)
Creatinine, Ser: 1.01 mg/dL — ABNORMAL HIGH (ref 0.57–1.00)
GFR, EST AFRICAN AMERICAN: 61 mL/min/{1.73_m2} (ref >59)
GFR, EST NON AFRICAN AMERICAN: 53 mL/min/{1.73_m2} — AB (ref >59)
Globulin, Total: 2.6 g/dL (ref 1.5–4.5)
Glucose: 85 mg/dL (ref 65–99)
Potassium: 5.3 mmol/L — ABNORMAL HIGH (ref 3.5–5.2)
Sodium: 133 mmol/L — ABNORMAL LOW (ref 134–144)
Total Protein: 6.5 g/dL (ref 6.0–8.5)

## 2018-04-15 LAB — PRO B NATRIURETIC PEPTIDE: NT-Pro BNP: 982 pg/mL — ABNORMAL HIGH (ref 0–738)

## 2018-04-16 ENCOUNTER — Encounter: Payer: Self-pay | Admitting: Cardiovascular Disease

## 2018-04-19 ENCOUNTER — Ambulatory Visit (HOSPITAL_COMMUNITY): Payer: Medicare Other | Attending: Cardiology

## 2018-04-19 DIAGNOSIS — I5032 Chronic diastolic (congestive) heart failure: Secondary | ICD-10-CM | POA: Insufficient documentation

## 2018-04-19 DIAGNOSIS — R0602 Shortness of breath: Secondary | ICD-10-CM | POA: Insufficient documentation

## 2018-04-21 ENCOUNTER — Telehealth: Payer: Self-pay

## 2018-04-21 MED ORDER — FUROSEMIDE 20 MG PO TABS: 20.0000 mg | ORAL_TABLET | Freq: Every day | ORAL | 3 refills | Status: DC

## 2018-04-21 NOTE — Telephone Encounter (Signed)
Reviewed results with patient who verbalized understanding.    When instructed to increase furosemide, [name] reported she had not been taking the medication for weeks because she ran out and never got a refill. Instructed her to restart the medication. She will start at 20 mg daily (she was wary to do any thing more at this time). She understands she will be called if Dr. Excell Seltzer has further recommendations.

## 2018-04-21 NOTE — Telephone Encounter (Signed)
-----   Message from Tonny Bollman, MD sent at 04/21/2018  2:21 PM EST ----- Labs and echo study reviewed.  Heart function is normal with the exception of elevated filling pressures and diastolic abnormalities.  She has mild aortic stenosis.  With elevated BNP and ongoing symptoms of shortness of breath, would have her try to alternate 20 and 40 mg of furosemide every other day rather than her current dose of 20 mg daily.

## 2018-06-02 ENCOUNTER — Other Ambulatory Visit: Payer: Self-pay | Admitting: Cardiovascular Disease

## 2018-06-09 ENCOUNTER — Other Ambulatory Visit: Payer: Self-pay

## 2018-07-30 ENCOUNTER — Ambulatory Visit: Payer: Medicare Other | Admitting: Cardiovascular Disease

## 2018-07-30 ENCOUNTER — Encounter

## 2018-11-19 ENCOUNTER — Encounter (HOSPITAL_COMMUNITY): Payer: Self-pay

## 2018-11-19 ENCOUNTER — Inpatient Hospital Stay (HOSPITAL_COMMUNITY)
Admission: EM | Admit: 2018-11-19 | Discharge: 2018-11-24 | DRG: 280 | Disposition: A | Discharge status: Home / Self Care | Payer: Medicare Other | Attending: Family Medicine | Admitting: Family Medicine

## 2018-11-19 ENCOUNTER — Emergency Department (HOSPITAL_COMMUNITY): Payer: Medicare Other

## 2018-11-19 ENCOUNTER — Telehealth: Payer: Self-pay | Admitting: Cardiovascular Disease

## 2018-11-19 ENCOUNTER — Observation Stay (HOSPITAL_COMMUNITY): Payer: Medicare Other

## 2018-11-19 DIAGNOSIS — Z20828 Contact with and (suspected) exposure to other viral communicable diseases: Secondary | ICD-10-CM | POA: Diagnosis present

## 2018-11-19 DIAGNOSIS — M47814 Spondylosis without myelopathy or radiculopathy, thoracic region: Secondary | ICD-10-CM | POA: Diagnosis present

## 2018-11-19 DIAGNOSIS — E875 Hyperkalemia: Secondary | ICD-10-CM | POA: Diagnosis present

## 2018-11-19 DIAGNOSIS — Z8719 Personal history of other diseases of the digestive system: Secondary | ICD-10-CM

## 2018-11-19 DIAGNOSIS — M4722 Other spondylosis with radiculopathy, cervical region: Secondary | ICD-10-CM | POA: Diagnosis present

## 2018-11-19 DIAGNOSIS — I214 Non-ST elevation (NSTEMI) myocardial infarction: Principal | ICD-10-CM

## 2018-11-19 DIAGNOSIS — R0602 Shortness of breath: Secondary | ICD-10-CM | POA: Diagnosis present

## 2018-11-19 DIAGNOSIS — R202 Paresthesia of skin: Secondary | ICD-10-CM | POA: Diagnosis present

## 2018-11-19 DIAGNOSIS — I82402 Acute embolism and thrombosis of unspecified deep veins of left lower extremity: Secondary | ICD-10-CM | POA: Diagnosis present

## 2018-11-19 DIAGNOSIS — Z7951 Long term (current) use of inhaled steroids: Secondary | ICD-10-CM

## 2018-11-19 DIAGNOSIS — I5033 Acute on chronic diastolic (congestive) heart failure: Secondary | ICD-10-CM | POA: Diagnosis present

## 2018-11-19 DIAGNOSIS — R7989 Other specified abnormal findings of blood chemistry: Secondary | ICD-10-CM | POA: Diagnosis present

## 2018-11-19 DIAGNOSIS — I2510 Atherosclerotic heart disease of native coronary artery without angina pectoris: Secondary | ICD-10-CM

## 2018-11-19 DIAGNOSIS — Z841 Family history of disorders of kidney and ureter: Secondary | ICD-10-CM

## 2018-11-19 DIAGNOSIS — M797 Fibromyalgia: Secondary | ICD-10-CM | POA: Diagnosis present

## 2018-11-19 DIAGNOSIS — Z888 Allergy status to other drugs, medicaments and biological substances status: Secondary | ICD-10-CM

## 2018-11-19 DIAGNOSIS — I252 Old myocardial infarction: Secondary | ICD-10-CM

## 2018-11-19 DIAGNOSIS — M3500 Sicca syndrome, unspecified: Secondary | ICD-10-CM | POA: Diagnosis present

## 2018-11-19 DIAGNOSIS — E876 Hypokalemia: Secondary | ICD-10-CM

## 2018-11-19 DIAGNOSIS — R778 Other specified abnormalities of plasma proteins: Secondary | ICD-10-CM | POA: Diagnosis present

## 2018-11-19 DIAGNOSIS — Z8349 Family history of other endocrine, nutritional and metabolic diseases: Secondary | ICD-10-CM

## 2018-11-19 DIAGNOSIS — E785 Hyperlipidemia, unspecified: Secondary | ICD-10-CM | POA: Diagnosis present

## 2018-11-19 DIAGNOSIS — Z833 Family history of diabetes mellitus: Secondary | ICD-10-CM

## 2018-11-19 DIAGNOSIS — Z9861 Coronary angioplasty status: Secondary | ICD-10-CM

## 2018-11-19 DIAGNOSIS — Z803 Family history of malignant neoplasm of breast: Secondary | ICD-10-CM

## 2018-11-19 DIAGNOSIS — M4802 Spinal stenosis, cervical region: Secondary | ICD-10-CM | POA: Diagnosis present

## 2018-11-19 DIAGNOSIS — Z7982 Long term (current) use of aspirin: Secondary | ICD-10-CM

## 2018-11-19 DIAGNOSIS — Z8249 Family history of ischemic heart disease and other diseases of the circulatory system: Secondary | ICD-10-CM

## 2018-11-19 DIAGNOSIS — Z79899 Other long term (current) drug therapy: Secondary | ICD-10-CM

## 2018-11-19 DIAGNOSIS — G8929 Other chronic pain: Secondary | ICD-10-CM | POA: Diagnosis present

## 2018-11-19 DIAGNOSIS — N182 Chronic kidney disease, stage 2 (mild): Secondary | ICD-10-CM | POA: Diagnosis not present

## 2018-11-19 DIAGNOSIS — R200 Anesthesia of skin: Secondary | ICD-10-CM | POA: Diagnosis present

## 2018-11-19 DIAGNOSIS — I10 Essential (primary) hypertension: Secondary | ICD-10-CM | POA: Diagnosis present

## 2018-11-19 DIAGNOSIS — I700 Atherosclerosis of aorta: Secondary | ICD-10-CM | POA: Diagnosis present

## 2018-11-19 DIAGNOSIS — E871 Hypo-osmolality and hyponatremia: Secondary | ICD-10-CM | POA: Diagnosis present

## 2018-11-19 DIAGNOSIS — I7300 Raynaud's syndrome without gangrene: Secondary | ICD-10-CM | POA: Diagnosis present

## 2018-11-19 DIAGNOSIS — I5032 Chronic diastolic (congestive) heart failure: Secondary | ICD-10-CM | POA: Diagnosis present

## 2018-11-19 DIAGNOSIS — K219 Gastro-esophageal reflux disease without esophagitis: Secondary | ICD-10-CM | POA: Diagnosis present

## 2018-11-19 DIAGNOSIS — I130 Hypertensive heart and chronic kidney disease with heart failure and stage 1 through stage 4 chronic kidney disease, or unspecified chronic kidney disease: Secondary | ICD-10-CM | POA: Diagnosis present

## 2018-11-19 DIAGNOSIS — Z90710 Acquired absence of both cervix and uterus: Secondary | ICD-10-CM

## 2018-11-19 DIAGNOSIS — Z7902 Long term (current) use of antithrombotics/antiplatelets: Secondary | ICD-10-CM

## 2018-11-19 DIAGNOSIS — Z885 Allergy status to narcotic agent status: Secondary | ICD-10-CM

## 2018-11-19 DIAGNOSIS — D509 Iron deficiency anemia, unspecified: Secondary | ICD-10-CM | POA: Diagnosis present

## 2018-11-19 DIAGNOSIS — I25119 Atherosclerotic heart disease of native coronary artery with unspecified angina pectoris: Secondary | ICD-10-CM | POA: Diagnosis present

## 2018-11-19 DIAGNOSIS — Z9049 Acquired absence of other specified parts of digestive tract: Secondary | ICD-10-CM

## 2018-11-19 DIAGNOSIS — M79602 Pain in left arm: Secondary | ICD-10-CM | POA: Diagnosis not present

## 2018-11-19 LAB — BASIC METABOLIC PANEL
Anion gap: 9 (ref 5–15)
BUN: 11 mg/dL (ref 8–23)
CO2: 21 mmol/L — ABNORMAL LOW (ref 22–32)
Calcium: 9.6 mg/dL (ref 8.9–10.3)
Chloride: 99 mmol/L (ref 98–111)
Creatinine, Ser: 1.09 mg/dL — ABNORMAL HIGH (ref 0.44–1.00)
GFR calc Af Amer: 56 mL/min — ABNORMAL LOW (ref >60)
GFR calc non Af Amer: 48 mL/min — ABNORMAL LOW (ref >60)
Glucose, Bld: 92 mg/dL (ref 70–99)
Potassium: 5.4 mmol/L — ABNORMAL HIGH (ref 3.5–5.1)
Sodium: 129 mmol/L — ABNORMAL LOW (ref 135–145)

## 2018-11-19 LAB — CBC
HCT: 35.3 % — ABNORMAL LOW (ref 36.0–46.0)
Hemoglobin: 11.2 g/dL — ABNORMAL LOW (ref 12.0–15.0)
MCH: 27.9 pg (ref 26.0–34.0)
MCHC: 31.7 g/dL (ref 30.0–36.0)
MCV: 88 fL (ref 80.0–100.0)
Platelets: 202 10*3/uL (ref 150–400)
RBC: 4.01 MIL/uL (ref 3.87–5.11)
RDW: 15.4 % (ref 11.5–15.5)
WBC: 5.5 10*3/uL (ref 4.0–10.5)
nRBC: 0 % (ref 0.0–0.2)

## 2018-11-19 LAB — BRAIN NATRIURETIC PEPTIDE: B Natriuretic Peptide: 794.7 pg/mL — ABNORMAL HIGH (ref 0.0–100.0)

## 2018-11-19 LAB — TROPONIN I (HIGH SENSITIVITY)
Troponin I (High Sensitivity): 80 ng/L — ABNORMAL HIGH (ref <18)
Troponin I (High Sensitivity): 85 ng/L — ABNORMAL HIGH (ref <18)
Troponin I (High Sensitivity): 81 ng/L — ABNORMAL HIGH (ref <18)

## 2018-11-19 LAB — D-DIMER, QUANTITATIVE: D-Dimer, Quant: 0.57 ug/mL-FEU — ABNORMAL HIGH (ref 0.00–0.50)

## 2018-11-19 MED ORDER — NITROGLYCERIN 0.4 MG SL SUBL: 0.4000 mg | SUBLINGUAL_TABLET | SUBLINGUAL | Status: DC | PRN

## 2018-11-19 MED ORDER — CLOPIDOGREL BISULFATE 75 MG PO TABS
75.0000 mg | ORAL_TABLET | Freq: Every day | ORAL | Status: DC
  Administered 2018-11-20 – 2018-11-24 (×5): 75 mg via ORAL
  Filled 2018-11-19 (×6): qty 1

## 2018-11-19 MED ORDER — AMLODIPINE BESYLATE 2.5 MG PO TABS
2.5000 mg | ORAL_TABLET | Freq: Every day | ORAL | Status: DC
  Administered 2018-11-20: 2.5 mg via ORAL
  Filled 2018-11-19: qty 1

## 2018-11-19 MED ORDER — LORATADINE 10 MG PO TABS
10.0000 mg | ORAL_TABLET | Freq: Every day | ORAL | Status: DC
  Administered 2018-11-20 – 2018-11-24 (×5): 10 mg via ORAL
  Filled 2018-11-19 (×5): qty 1

## 2018-11-19 MED ORDER — ONDANSETRON HCL 4 MG/2ML IJ SOLN: 4.0000 mg | Freq: Four times a day (QID) | INTRAMUSCULAR | Status: DC | PRN

## 2018-11-19 MED ORDER — FLUTICASONE PROPIONATE 50 MCG/ACT NA SUSP
1.0000 | Freq: Every day | NASAL | Status: DC | PRN
  Filled 2018-11-19: qty 16

## 2018-11-19 MED ORDER — ACETAMINOPHEN 325 MG PO TABS: 650.0000 mg | ORAL_TABLET | ORAL | Status: DC | PRN

## 2018-11-19 MED ORDER — ISOSORBIDE MONONITRATE ER 30 MG PO TB24: 30.0000 mg | ORAL_TABLET | Freq: Every day | ORAL | Status: DC

## 2018-11-19 MED ORDER — ASPIRIN 81 MG PO CHEW
81.0000 mg | CHEWABLE_TABLET | Freq: Every day | ORAL | Status: DC
  Administered 2018-11-20 – 2018-11-22 (×3): 81 mg via ORAL
  Filled 2018-11-19 (×5): qty 1

## 2018-11-19 MED ORDER — SODIUM POLYSTYRENE SULFONATE 15 GM/60ML PO SUSP
15.0000 g | Freq: Once | ORAL | Status: AC
  Administered 2018-11-20: 05:00:00 15 g via ORAL
  Filled 2018-11-19: qty 60

## 2018-11-19 MED ORDER — ENOXAPARIN SODIUM 40 MG/0.4ML ~~LOC~~ SOLN
40.0000 mg | SUBCUTANEOUS | Status: DC
  Filled 2018-11-19: qty 0.4

## 2018-11-19 MED ORDER — HYDRALAZINE HCL 20 MG/ML IJ SOLN: 5.0000 mg | INTRAMUSCULAR | Status: DC | PRN

## 2018-11-19 MED ORDER — POLYVINYL ALCOHOL 1.4 % OP SOLN
1.0000 [drp] | Freq: Two times a day (BID) | OPHTHALMIC | Status: DC
  Administered 2018-11-20 – 2018-11-23 (×8): 1 [drp] via OPHTHALMIC
  Filled 2018-11-19 (×2): qty 15

## 2018-11-19 MED ORDER — ASPIRIN 81 MG PO CHEW
324.0000 mg | CHEWABLE_TABLET | Freq: Once | ORAL | Status: AC
  Administered 2018-11-19: 16:00:00 324 mg via ORAL
  Filled 2018-11-19: qty 4

## 2018-11-19 MED ORDER — IOHEXOL 350 MG/ML SOLN
100.0000 mL | Freq: Once | INTRAVENOUS | Status: AC | PRN
  Administered 2018-11-19: 100 mL via INTRAVENOUS

## 2018-11-19 MED ORDER — FERROUS SULFATE 325 (65 FE) MG PO TABS
325.0000 mg | ORAL_TABLET | Freq: Every day | ORAL | Status: DC
  Administered 2018-11-20 – 2018-11-24 (×4): 325 mg via ORAL
  Filled 2018-11-19 (×5): qty 1

## 2018-11-19 MED ORDER — VITAMIN C 250 MG PO TABS
250.0000 mg | ORAL_TABLET | Freq: Every day | ORAL | Status: DC
  Administered 2018-11-20 – 2018-11-24 (×5): 250 mg via ORAL
  Filled 2018-11-19 (×6): qty 1

## 2018-11-19 MED ORDER — FAMOTIDINE 20 MG PO TABS
20.0000 mg | ORAL_TABLET | Freq: Every day | ORAL | Status: DC
  Administered 2018-11-20 – 2018-11-24 (×5): 20 mg via ORAL
  Filled 2018-11-19 (×5): qty 1

## 2018-11-19 NOTE — Telephone Encounter (Signed)
I spoke to the patient who called because she is extremely SOB, (audible on the phone) and has had CP radiating down the left arm for about 1 hour now.  I told her to immediately hang up the phone and call 911.  Her husband is there with her.  They both verbalized understanding.

## 2018-11-19 NOTE — H&P (Addendum)
History and Physical    [name] LimaBetty S Caligiuri NWG:[id number] DOB: 05/10/1935 DOA: 11/19/2018  Referring MD/NP/PA:   PCP: Laurann MontanaWhite, Cynthia, MD   Patient coming from:  The patient is coming from home.  At baseline, pt is independent for most of ADL.        Chief Complaint: SOB, left arm numbness, weakness   HPI: [name] is a 83 y.o. female with medical history significant of hypertension, hyperlipidemia, GERD, SVT, Sjogren's disease, dCHF, CAD, iron deficiency anemia, CKD 2, right bundle blockage, who presents with shortness of breath, left arm numbness and weakness  Patient states that she has chronic mild shortness of breath, which has worsened today.  She does not have cough, chest pain, fever or chills.  No tenderness in the calf areas.  She also reports left arm numbness and weakness which started earlier today.  Initially states that she also has pain in left arm, but then she states that she is not very sure whether she has left arm pain or not.  Patient states that she has mild intermittent diarrhea, one episode today.  No nausea vomiting or abdominal pain.  No symptoms of UTI.  No facial droop or slurred speech.    ED Course: pt was found to have troponin 80, 85, positive d-dimer 0.57, pending COVID-19 test, potassium 5.4, sodium 129, renal function close to baseline, temperature normal, blood pressure 152/86, heart rate 69, oxygen saturation 99% on room air.  Chest x-ray negative.  Patient is placed on telemetry bed for observation.  Review of Systems:   General: no fevers, chills, no body weight gain, has fatigue HEENT: no blurry vision, hearing changes or sore throat Respiratory: has dyspnea, no coughing, wheezing CV: no chest pain, no palpitations GI: no nausea, vomiting, abdominal pain, has diarrhea, no constipation GU: no dysuria, burning on urination, increased urinary frequency, hematuria  Ext: no leg edema Neuro: no unilateral weakness, numbness, or tingling, no vision change  or hearing loss Skin: no rash, no skin tear. MSK: No muscle spasm, no deformity, no limitation of range of movement in spin Heme: No easy bruising.  Travel history: No recent long distant travel.  Allergy:  Allergies  Allergen Reactions  . Ezetimibe Other (See Comments)    Muscle pain= zetia  . Ezetimibe-Simvastatin Other (See Comments)    Muscle pain= vytorin  . Morphine Other (See Comments)    nightmares  . Rosuvastatin Other (See Comments)    Muscle pain  . Ambien [Zolpidem Tartrate] Other (See Comments)    Fell down  . Plaquenil [Hydroxychloroquine Sulfate] Other (See Comments)    Doesn't recall reaction  . Polytrim [Polymyxin B-Trimethoprim] Other (See Comments)    unknown  . Prevacid [Lansoprazole] Other (See Comments)    dizziness    Past Medical History:  Diagnosis Date  . Abnormal nuclear stress test 05/24/2015  . Atypical ductal hyperplasia of right breast 02/22/2014  . C. difficile colitis - recurrent 06/23/2016  . C. difficile diarrhea on po vanc 10/04/2015  . CAD (coronary artery disease) 2006, 2016   a. 08/2014 NSTEMI: Attempted PCI of 85% RPDA - unable to wire, complicated by dissection, case aborted;  b. 08/2014 Relook Cath: LM nl, LAD 60p, SP1 90ost (small), D1 65ost, RI 45/50, RCA nl, RPDA 90 - healed dissection;  b. 08/2014 Echo: EF 55%, mild/mon midapical inferior HK, mild AI, PASP 43mmHg. c. Cath 05/2015 with CTO of the right-PDA and unsuccessful PCI  . CAD S/P unsuccessful PCI 08/18/14 04/20/2007  .  chronic back   . CKD (chronic kidney disease), stage II   . COLONIC POLYPS, ADENOMATOUS, HX OF   . Diverticulitis   . Dyslipidemia    a. statin intolerant.  [name]. Dyspnea    with exertion  . Elevated troponin level 10/04/2015  . ENDOMETRIOSIS   . Essential hypertension   . Fibromyalgia   . GASTRIC ANTRAL VASCULAR ECTASIA   . GERD   . History of hiatal hernia   . Hypertension, uncontrolled 10/04/2015  . Insomnia   . IRON DEFICIENCY ANEMIA SECONDARY TO BLOOD LOSS   .  Myocardial infarction (HCC) 08/2014  . Neuropathy   . NSTEMI (non-ST elevated myocardial infarction) (HCC) 08/18/2014  . Osteoarthritis    "back, hips, basically all over" (05/24/2015)  . Palpitations 03/06/2011  . Paresthesia 08/01/2014  . Raynaud's syndrome   . Rosacea, acne   . Sicca syndrome (HCC)   . SJOGREN'S SYNDROME   . SUPRAVENTRICULAR TACHYCARDIA   . Vitamin D deficiency     Past Surgical History:  Procedure Laterality Date  . ABDOMINAL HYSTERECTOMY     left right ovary  . APPENDECTOMY    . BREAST EXCISIONAL BIOPSY Right   . BREAST EXCISIONAL BIOPSY Right   . BREAST LUMPECTOMY WITH RADIOACTIVE SEED LOCALIZATION Right 01/24/2014   Procedure: BREAST LUMPECTOMY WITH RADIOACTIVE SEED LOCALIZATION;  Surgeon: Avel Peaceodd Rosenbower, MD;  Location: [facility];  Service: General;  Laterality: Right;  . CARDIAC CATHETERIZATION N/A 08/18/2014   Procedure: Left Heart Cath and Coronary Angiography;  Surgeon: Marykay Lexavid W Harding, MD;  [facility];  Service: Cardiovascular;  Laterality: N/A;  . CARDIAC CATHETERIZATION  08/18/2014   Procedure: Coronary Balloon Angioplasty;  Surgeon: Marykay Lexavid W Harding, MD;  [facility];  Service: Cardiovascular;;  . CARDIAC CATHETERIZATION N/A 08/21/2014   Procedure: Left Heart Cath and Coronary Angiography;  Surgeon: Marykay Lexavid W Harding, MD;  [facility];  Service: Cardiovascular;  Laterality: N/A;  . CARDIAC CATHETERIZATION  2006  . CARDIAC CATHETERIZATION N/A 05/24/2015   Procedure: Left Heart Cath and Coronary Angiography;  Surgeon: Tonny BollmanMichael Cooper, MD;  [facility];  Service: Cardiovascular;  Laterality: N/A;  . COLONOSCOPY W/ BIOPSIES AND POLYPECTOMY  12/17/2006   adenomatous polyps, external hemorrhoids  . ESOPHAGOGASTRODUODENOSCOPY  11/27/2009   APC tretment of lesions  . EYE SURGERY    . HERNIA REPAIR    . HIATAL HERNIA REPAIR  2005   and paraesophageal  . LAPAROSCOPIC CHOLECYSTECTOMY    .  TEAR DUCT PROBING Left    "no plugs or anything"    Social History:  reports that she has never smoked. She has never used smokeless tobacco. She reports current alcohol use. She reports that she does not use drugs.  Family History:  Family History  Problem Relation Age of Onset  . Heart failure Mother   . Diabetes Mother   . Kidney failure Mother   . Hypertension Mother   . Coronary artery disease Mother   . Heart disease Mother   . Hyperlipidemia Mother   . Peripheral vascular disease Mother        amputation  . Breast cancer Sister        x 3 sisters  . Cancer Sister   . Heart disease Father   . Hypertension Father   . Coronary artery disease Father   . Heart attack Father   . Coronary artery disease Brother   . Heart disease Brother   .  Coronary artery disease Brother   . Coronary artery disease Brother   . Breast cancer Sister   . Breast cancer Sister   . Breast cancer Daughter   . Cancer Daughter   . Colon cancer Neg Hx   . Esophageal cancer Neg Hx   . Stomach cancer Neg Hx   . Rectal cancer Neg Hx      Prior to Admission medications   Medication Sig Start Date End Date Taking? Authorizing Provider  amLODipine (NORVASC) 2.5 MG tablet Take 2.5 mg by mouth daily.    [provider]  aspirin 81 MG tablet Take 81 mg by mouth daily.    [provider]  calcium carbonate (ANTACID) 420 MG CHEW chewable tablet Chew 420 mg by mouth daily.    [provider]  clopidogrel (PLAVIX) 75 MG tablet TAKE 1 TABLET BY MOUTH ONCE DAILY WITH BREAKFAST Patient taking differently: Take 75 mg by mouth daily.  10/21/17   Tonny Bollman, MD  famotidine (PEPCID) 20 MG tablet Take 1 tablet (20 mg total) by mouth daily. 06/25/16   Vassie Loll, MD  feeding supplement, ENSURE ENLIVE, (ENSURE ENLIVE) LIQD Take 237 mLs by mouth 2 (two) times daily between meals. 06/24/16   Vassie Loll, MD  ferrous sulfate 325 (65 FE) MG tablet Take 325 mg by mouth as needed. Stopped  11/08/16    [provider]  fluticasone Aleda Grana ALLERGY RELIEF) 50 MCG/ACT nasal spray Place 1 spray into the nose daily as needed for allergies.    [provider]  furosemide (LASIX) 20 MG tablet Take 1 tablet (20 mg total) by mouth daily. 04/21/18 04/16/19  Tonny Bollman, MD  isosorbide mononitrate (IMDUR) 30 MG 24 hr tablet Take 1 tablet by mouth once daily 06/02/18   Tonny Bollman, MD  loratadine (CLARITIN) 10 MG tablet Take 10 mg by mouth daily.    [provider]  Multiple Vitamins-Minerals (EMERGEN-C VITAMIN C PO) Take 1 tablet by mouth daily.    [provider]  nitroGLYCERIN (NITROSTAT) 0.4 MG SL tablet Place 1 tablet (0.4 mg total) under the tongue every 5 (five) minutes as needed for chest pain. 06/12/15   Tonny Bollman, MD  Polyvinyl Alcohol-Povidone (REFRESH OP) Place 1 drop into both eyes 2 (two) times daily.     [provider]  temazepam (RESTORIL) 15 MG capsule Take 15 mg by mouth at bedtime as needed for sleep. SLEEP AID 08/03/13   [provider]    Physical Exam: Vitals:   11/19/18 1514 11/19/18 1524 11/19/18 1647 11/19/18 1700  BP: (!) 159/108   (!) 152/86  Pulse: 79   69  Resp: 12   16  Temp:  98.8 F (37.1 C)    TempSrc:  Oral    SpO2: 99%   100%  Weight:   49.9 kg   Height:   5\' 2"  (1.575 m)    General: Not in acute distress HEENT:       Eyes: PERRL, EOMI, no scleral icterus.       ENT: No discharge from the ears and nose, no pharynx injection, no tonsillar enlargement.        Neck: No JVD, no mass felt. Heme: No neck lymph node enlargement. Cardiac: S1/S2, RRR, No murmurs, No gallops or rubs. Respiratory: No rales, wheezing, rhonchi or rubs. GI: Soft, nondistended, nontender, no rebound pain, no organomegaly, BS present. GU: No hematuria Ext: No pitting leg edema bilaterally. 2+DP/PT pulse bilaterally. Musculoskeletal: No joint deformities, No joint  redness or warmth, no limitation of ROM in spin.  Skin: No rashes.  Neuro: Alert, oriented X3, cranial nerves II-XII grossly intact, moves all extremities normally. Muscle strength is slightly decreased in left arm compared to the right arm. Sensation to light touch intact. Brachial reflex 2+ bilaterally. Negative Babinski's sign.  Psych: Patient is not psychotic, no suicidal or hemocidal ideation.  Labs on Admission: I have personally reviewed following labs and imaging studies  CBC: Recent Labs  Lab 11/19/18 1528  WBC 5.5  HGB 11.2*  HCT 35.3*  MCV 88.0  PLT 202   Basic Metabolic Panel: Recent Labs  Lab 11/19/18 1528  NA 129*  K 5.4*  CL 99  CO2 21*  GLUCOSE 92  BUN 11  CREATININE 1.09*  CALCIUM 9.6   GFR: Estimated Creatinine Clearance: 32.4 mL/min (A) (by C-G formula based on SCr of 1.09 mg/dL (H)). Liver Function Tests: No results for input(s): AST, ALT, ALKPHOS, BILITOT, PROT, ALBUMIN in the last 168 hours. No results for input(s): LIPASE, AMYLASE in the last 168 hours. No results for input(s): AMMONIA in the last 168 hours. Coagulation Profile: No results for input(s): INR, PROTIME in the last 168 hours. Cardiac Enzymes: No results for input(s): CKTOTAL, CKMB, CKMBINDEX, TROPONINI in the last 168 hours. BNP (last 3 results) Recent Labs    04/14/18 1517  PROBNP 982*   HbA1C: No results for input(s): HGBA1C in the last 72 hours. CBG: No results for input(s): GLUCAP in the last 168 hours. Lipid Profile: No results for input(s): CHOL, HDL, LDLCALC, TRIG, CHOLHDL, LDLDIRECT in the last 72 hours. Thyroid Function Tests: No results for input(s): TSH, T4TOTAL, FREET4, T3FREE, THYROIDAB in the last 72 hours. Anemia Panel: No results for input(s): VITAMINB12, FOLATE, FERRITIN, TIBC, IRON, RETICCTPCT in the last 72 hours. Urine analysis:    Component Value Date/Time   COLORURINE YELLOW 01/30/2017 1336   APPEARANCEUR CLEAR 01/30/2017 1336   LABSPEC 1.012 01/30/2017 1336   PHURINE 5.0 01/30/2017 1336    GLUCOSEU NEGATIVE 01/30/2017 1336   HGBUR NEGATIVE 01/30/2017 1336   BILIRUBINUR NEGATIVE 01/30/2017 1336   KETONESUR NEGATIVE 01/30/2017 1336   PROTEINUR NEGATIVE 01/30/2017 1336   UROBILINOGEN 0.2 03/15/2009 1800   NITRITE NEGATIVE 01/30/2017 1336   LEUKOCYTESUR SMALL (A) 01/30/2017 1336   Sepsis Labs: @LABRCNTIP (procalcitonin:4,lacticidven:4) )No results found for this or any previous visit (from the past 240 hour(s)).   Radiological Exams on Admission: Dg Chest Port 1 View  Result Date: 11/19/2018 CLINICAL DATA:  Pt complains of SOB [id number] but states that the reason she came in what because her family "made her" because of her left arm numbness 2-3 days ago EXAM: PORTABLE CHEST 1 VIEW COMPARISON:  02/17/2017 FINDINGS: Heart size is normal. Stable mild elevation of the RIGHT hemidiaphragm. The lungs are free of focal consolidations and pleural effusions. No pulmonary edema. Visualized osseous structures have a normal appearance. IMPRESSION: No active disease. Electronically Signed   By: Norva PavlovElizabeth  Brown M.D.   On: 11/19/2018 15:45     EKG: Independently reviewed.  Sinus rhythm, QTC 450, low-voltage, LAE, nonspecific T wave change.   Assessment/Plan Principal Problem:   SOB (shortness of breath) Active Problems:   Dyslipidemia-statin and Zetia intol   Iron deficiency anemia   CAD S/P unsuccessful PCI 08/18/14   GERD   Essential hypertension   CKD (chronic kidney disease), stage II   Elevated troponin   Hyponatremia   Hyperkalemia   Chronic diastolic (congestive) heart failure (HCC)   Left arm  numbness   SOB (shortness of breath): Etiology is not clear.  Patient has history of CHF, but no leg edema.  Chest x-ray not show pulmonary edema.  Will get BNP for further evaluation. Her trop is elevated 80 -->85, indicating patient may has ischemic change.  D-dimer is positive, will need to rule out PE.   - will place on Tele bed for obs - Trend Trop - Repeat EKG in the am  - prn  Nitroglycerin, and Aspirin, plavix, Imdur - pt can not tolerate statin and Zetia - Risk factor stratification: will check FLP and A1C  - check UDS - CTA to r/o PE - LE doppler to r/o DVT  Elevated trop: -see above  Addendum: trop is trending up from 80-->85-->197. BNP is 794. Due to hyponatremia, will not start IV lasix. Pt's O2 sat 97% on RA. -will start IV heparin.  Hx of CAD S/P unsuccessful PCI 08/18/14 and elevated trop: has SOB, denies CP. -see above  Left arm numbness and weakness and questionable pain: Will need to rule out stroke and radiculopathy -MRI-brain -MRI-C spin  Dyslipidemia-statin and Zetia intol: -f/u FLP  Iron deficiency anemia: Hgb 11.2. -Continue iron supplement  GERD: -pepcid  HTN:  -Continue home medications: Amlodipine -IV hydralazine prn  CKD (chronic kidney disease), stage II: stable. Cre 1.09, BUN 11. GFR 48. -f/u by BMP  Hyponatremia: Na 129. Most likely due to poor oral intake and continuation of lasix. Mental status normal - Will check urine sodium, urine osmolality, serum osmolality. - check TSH - pt is NPO due to CP which will serve as Fluid restriction  - IVF: will not give IVF since we are not very sure if her SOB is related to CHF - f/u by BMP -hold lasix  Hyperkalemia: K=5.4, mild. -will give 15 g of Kayexalate  Chronic diastolic (congestive) heart failure ([place]): 2D echo on 04/19/2018 showed EF of 55-60%.  Patient has no leg edema or JVD.  Chest x-ray did not show pulmonary edema.  Does not seem to have acute exacerbation. -Hold Lasix due to hyponatremia - Check BNP    DVT ppx: on IV heparin Code Status: Full code Family Communication:   Yes, patient's husband at bed side Disposition Plan:  Anticipate discharge back to previous home environment Consults called:  none Admission status: Obs / tele    Date of Service 11/19/2018    [facility]   If 7PM-7AM, please contact night-coverage [email or web address]  Password [email or web address] 11/19/2018, 8:20 PM

## 2018-11-19 NOTE — ED Provider Notes (Signed)
[facility] EMERGENCY DEPARTMENT Provider Note   CSN: [id number] Arrival date & time: 11/19/18  1507     History   Chief Complaint Chief Complaint  Patient presents with  . Arm Pain    HPI [name] is a 83 y.o. female with a past medical history of CAD and HTN who presents to the emergency department with tingling in her left arm and dyspnea. No history of fall or trauma.    The history is provided by the patient.  Illness Location:  L arm and occasional L chest discomfort Quality:  Tingling Severity:  Mild Duration:  2 days Timing:  Constant Progression:  Waxing and waning Chronicity:  Recurrent Context:  Worse with excertion, similar to prior arm tingling in the setting of prior ACS Worsened by:  Exertion  Associated symptoms: chest pain and shortness of breath   Associated symptoms: no abdominal pain, no congestion, no cough, no diarrhea, no fever, no headaches, no nausea, no rash, no vomiting and no wheezing   Shortness of breath:    Severity:  Moderate   Frequency: occuring at rest and with excertion, occurs only with exertion at baseline.   Progression:  Worsening Risk factors:  CAD   Past Medical History:  Diagnosis Date  . Abnormal nuclear stress test 05/24/2015  . Atypical ductal hyperplasia of right breast 02/22/2014  . C. difficile colitis - recurrent 06/23/2016  . C. difficile diarrhea on po vanc 10/04/2015  . CAD (coronary artery disease) 2006, 2016   a. 08/2014 NSTEMI: Attempted PCI of 85% RPDA - unable to wire, complicated by dissection, case aborted;  b. 08/2014 Relook Cath: LM nl, LAD 60p, SP1 90ost (small), D1 65ost, RI 45/50, RCA nl, RPDA 90 - healed dissection;  b. 08/2014 Echo: EF 55%, mild/mon midapical inferior HK, mild AI, PASP . c. Cath 05/2015 with CTO of the right-PDA and unsuccessful PCI  . CAD S/P unsuccessful PCI 08/18/14 04/20/2007  . chronic back   . CKD (chronic kidney disease), stage II   . COLONIC POLYPS, ADENOMATOUS,  HX OF   . Diverticulitis   . Dyslipidemia    a. statin intolerant.  [name] Dyspnea    with exertion  . Elevated troponin level 10/04/2015  . ENDOMETRIOSIS   . Essential hypertension   . Fibromyalgia   . GASTRIC ANTRAL VASCULAR ECTASIA   . GERD   . History of hiatal hernia   . Hypertension, uncontrolled 10/04/2015  . Insomnia   . IRON DEFICIENCY ANEMIA SECONDARY TO BLOOD LOSS   . Myocardial infarction (HCC) 08/2014  . Neuropathy   . NSTEMI (non-ST elevated myocardial infarction) (HCC) 08/18/2014  . Osteoarthritis    "back, hips, basically all over" (05/24/2015)  . Palpitations 03/06/2011  . Paresthesia 08/01/2014  . Raynaud's syndrome   . Rosacea, acne   . Sicca syndrome (HCC)   . SJOGREN'S SYNDROME   . SUPRAVENTRICULAR TACHYCARDIA   . Vitamin D deficiency     Patient Active Problem List   Diagnosis Date Noted  . Left arm numbness 11/19/2018  . CKD (chronic kidney disease), stage II   . SOB (shortness of breath)   . Elevated troponin   . Hyponatremia   . Hyperkalemia   . Chronic diastolic (congestive) heart failure (HCC)   . C. difficile colitis - recurrent 06/23/2016  . Hypertension, uncontrolled 10/04/2015  . Essential hypertension   . CAD (coronary artery disease)   . Paresthesia 08/01/2014  . Atypical ductal hyperplasia of right breast 02/22/2014  .  Dizziness and giddiness 05/24/2013  . Palpitations 03/06/2011  . [place] SYNDROME 04/16/2009  . SUPRAVENTRICULAR TACHYCARDIA 06/24/2008  . Dyslipidemia-statin and Zetia intol 04/20/2007  . Iron deficiency anemia 04/20/2007  . CAD S/P unsuccessful PCI 08/18/14 04/20/2007  . Right bundle branch block 04/20/2007  . Raynaud's syndrome 04/20/2007  . GERD 04/20/2007  . GASTRIC ANTRAL VASCULAR ECTASIA 04/20/2007  . ENDOMETRIOSIS 04/20/2007  . ACNE ROSACEA 04/20/2007  . OSTEOARTHRITIS 04/20/2007  . COLONIC POLYPS, ADENOMATOUS, HX OF 04/20/2007  . HEADACHE, CHRONIC, HX OF 04/20/2007    Past Surgical History:  Procedure  Laterality Date  . ABDOMINAL HYSTERECTOMY     left right ovary  . APPENDECTOMY    . BREAST EXCISIONAL BIOPSY Right   . BREAST EXCISIONAL BIOPSY Right   . BREAST LUMPECTOMY WITH RADIOACTIVE SEED LOCALIZATION Right 01/24/2014   Procedure: BREAST LUMPECTOMY WITH RADIOACTIVE SEED LOCALIZATION;  Surgeon: Jackolyn Confer, MD;  Location: [place];  Service: General;  Laterality: Right;  . CARDIAC CATHETERIZATION N/A 08/18/2014   Procedure: Left Heart Cath and Coronary Angiography;  Surgeon: Leonie Man, MD;  Location: [place] [facility];  Service: Cardiovascular;  Laterality: N/A;  . CARDIAC CATHETERIZATION  08/18/2014   Procedure: Coronary Balloon Angioplasty;  Surgeon: Leonie Man, MD;  Location: [place] [facility];  Service: Cardiovascular;;  . CARDIAC CATHETERIZATION N/A 08/21/2014   Procedure: Left Heart Cath and Coronary Angiography;  Surgeon: Leonie Man, MD;  Location: [place] [facility];  Service: Cardiovascular;  Laterality: N/A;  . CARDIAC CATHETERIZATION  2006  . CARDIAC CATHETERIZATION N/A 05/24/2015   Procedure: Left Heart Cath and Coronary Angiography;  Surgeon: Sherren Mocha, MD;  Location: [place] [facility];  Service: Cardiovascular;  Laterality: N/A;  . COLONOSCOPY W/ BIOPSIES AND POLYPECTOMY  12/17/2006   adenomatous polyps, external hemorrhoids  . ESOPHAGOGASTRODUODENOSCOPY  11/27/2009   APC tretment of lesions  . EYE SURGERY    . HERNIA REPAIR    . HIATAL HERNIA REPAIR  2005   and paraesophageal  . LAPAROSCOPIC CHOLECYSTECTOMY    . TEAR DUCT PROBING Left    "no plugs or anything"     OB History   No obstetric history on file.      Home Medications    Prior to Admission medications   Medication Sig Start Date End Date Taking? Authorizing Provider  acetaminophen (TYLENOL) 500 MG tablet Take 1,000 mg by mouth 3 (three) times daily.   Yes [provider]  amLODipine (NORVASC) 2.5 MG tablet Take 2.5 mg by mouth daily.   Yes [provider]  aspirin 81 MG tablet Take 81 mg by mouth daily.   Yes [provider]  clopidogrel (PLAVIX) 75 MG tablet TAKE 1 TABLET BY MOUTH ONCE DAILY WITH BREAKFAST Patient taking differently: Take 75 mg by mouth daily.  10/21/17  Yes Sherren Mocha, MD  famotidine (PEPCID) 20 MG tablet Take 1 tablet (20 mg total) by mouth daily. 06/25/16  Yes Barton Dubois, MD  feeding supplement, ENSURE ENLIVE, (ENSURE ENLIVE) LIQD Take 237 mLs by mouth 2 (two) times daily between meals. 06/24/16  Yes Barton Dubois, MD  ferrous sulfate 325 (65 FE) MG tablet Take 325 mg by mouth as needed. Stopped 11/08/16   Yes [provider]  fluticasone (FLONASE ALLERGY RELIEF) 50 MCG/ACT nasal spray Place 1 spray into the nose daily as needed for allergies.   Yes [provider]  furosemide (LASIX) 20 MG tablet Take 1 tablet (20 mg total) by  mouth daily. 04/21/18 04/16/19 Yes Tonny Bollmanooper, Michael, MD  isosorbide mononitrate (IMDUR) 30 MG 24 hr tablet Take 1 tablet by mouth once daily 06/02/18  Yes Tonny Bollmanooper, Michael, MD  loratadine (CLARITIN) 10 MG tablet Take 10 mg by mouth daily.   Yes [provider]  Multiple Vitamins-Minerals (EMERGEN-C VITAMIN C PO) Take 1 tablet by mouth daily.   Yes [provider]  nitroGLYCERIN (NITROSTAT) 0.4 MG SL tablet Place 1 tablet (0.4 mg total) under the tongue every 5 (five) minutes as needed for chest pain. 06/12/15  Yes Tonny Bollmanooper, Michael, MD  Polyvinyl Alcohol-Povidone (REFRESH OP) Place 1 drop into both eyes 2 (two) times daily.    Yes [provider]    Family History Family History  Problem Relation Age of Onset  . Heart failure Mother   . Diabetes Mother   . Kidney failure Mother   . Hypertension Mother   . Coronary artery disease Mother   . Heart disease Mother   . Hyperlipidemia Mother   . Peripheral vascular disease Mother        amputation  . Breast cancer Sister        x 3 sisters  . Cancer Sister   . Heart disease Father    . Hypertension Father   . Coronary artery disease Father   . Heart attack Father   . Coronary artery disease Brother   . Heart disease Brother   . Coronary artery disease Brother   . Coronary artery disease Brother   . Breast cancer Sister   . Breast cancer Sister   . Breast cancer Daughter   . Cancer Daughter   . Colon cancer Neg Hx   . Esophageal cancer Neg Hx   . Stomach cancer Neg Hx   . Rectal cancer Neg Hx     Social History Social History   Tobacco Use  . Smoking status: Never Smoker  . Smokeless tobacco: Never Used  Substance Use Topics  . Alcohol use: Yes    Comment: occasional wine  . Drug use: No     Allergies   Ezetimibe, Ezetimibe-simvastatin, Morphine, Rosuvastatin, Ambien [zolpidem tartrate], Plaquenil [hydroxychloroquine sulfate], Polytrim [polymyxin b-trimethoprim], and Prevacid [lansoprazole]   Review of Systems Review of Systems  Constitutional: Negative for fever.  HENT: Negative for congestion and trouble swallowing.   Eyes: Negative for visual disturbance.  Respiratory: Positive for shortness of breath. Negative for cough and wheezing.   Cardiovascular: Positive for chest pain. Negative for palpitations and leg swelling.  Gastrointestinal: Negative for abdominal pain, constipation, diarrhea, nausea and vomiting.  Genitourinary: Negative for dysuria.  Skin: Negative for rash.  Neurological: Positive for numbness. Negative for syncope, weakness and headaches.  Psychiatric/Behavioral: Negative for confusion.     Physical Exam Updated Vital Signs BP (!) 142/81 (BP Location: Left Arm)   Pulse 92   Temp 98.4 F (36.9 C) (Oral)   Resp 17   Ht 5\' 2"  (1.575 m)   Wt 49.9 kg   SpO2 95%   BMI 20.12 kg/m   Physical Exam Constitutional:      General: She is not in acute distress. HENT:     Head: Normocephalic and atraumatic.     Right Ear: External ear normal.     Left Ear: External ear normal.     Nose: Nose normal.     Mouth/Throat:      Mouth: Mucous membranes are moist.     Pharynx: Oropharynx is clear.  Eyes:     Pupils:  Pupils are equal, round, and reactive to light.  Neck:     Musculoskeletal: Neck supple.  Cardiovascular:     Rate and Rhythm: Normal rate and regular rhythm.     Pulses: Normal pulses.  Pulmonary:     Effort: Pulmonary effort is normal. No respiratory distress.     Breath sounds: No stridor. No wheezing or rales.  Chest:     Chest wall: No tenderness.  Abdominal:     General: Bowel sounds are normal.     Palpations: Abdomen is soft.     Tenderness: There is no abdominal tenderness. There is no guarding or rebound.  Musculoskeletal:        General: No tenderness.     Right lower leg: No edema.     Left lower leg: No edema.  Skin:    General: Skin is warm and dry.  Neurological:     General: No focal deficit present.     Mental Status: She is alert and oriented to person, place, and time.     Cranial Nerves: No cranial nerve deficit.     Motor: No weakness.     Coordination: Coordination normal.     Comments: Sensation intact however feels tingly in LUE      ED Treatments / Results  Labs (all labs ordered are listed, but only abnormal results are displayed) Labs Reviewed  BASIC METABOLIC PANEL - Abnormal; Notable for the following components:      Result Value   Sodium 129 (*)    Potassium 5.4 (*)    CO2 21 (*)    Creatinine, Ser 1.09 (*)    GFR calc non Af Amer 48 (*)    GFR calc Af Amer 56 (*)    All other components within normal limits  CBC - Abnormal; Notable for the following components:   Hemoglobin 11.2 (*)    HCT 35.3 (*)    All other components within normal limits  D-DIMER, QUANTITATIVE (NOT AT [facility]) - Abnormal; Notable for the following components:   D-Dimer, Quant 0.57 (*)    All other components within normal limits  BRAIN NATRIURETIC PEPTIDE - Abnormal; Notable for the following components:   B Natriuretic Peptide 794.7 (*)    All other components within  normal limits  TROPONIN I (HIGH SENSITIVITY) - Abnormal; Notable for the following components:   Troponin I (High Sensitivity) 80 (*)    All other components within normal limits  TROPONIN I (HIGH SENSITIVITY) - Abnormal; Notable for the following components:   Troponin I (High Sensitivity) 85 (*)    All other components within normal limits  TROPONIN I (HIGH SENSITIVITY) - Abnormal; Notable for the following components:   Troponin I (High Sensitivity) 81 (*)    All other components within normal limits  TROPONIN I (HIGH SENSITIVITY) - Abnormal; Notable for the following components:   Troponin I (High Sensitivity) 197 (*)    All other components within normal limits  SARS CORONAVIRUS 2 (TAT 6-24 HRS)  OSMOLALITY  OSMOLALITY, URINE  SODIUM, URINE, RANDOM  TSH  HEMOGLOBIN A1C  LIPID PANEL  RAPID URINE DRUG SCREEN, HOSP PERFORMED    EKG EKG Interpretation  Date/Time:  Friday November 19 2018 15:14:50 EDT Ventricular Rate:  79 PR Interval:    QRS Duration: 97 QT Interval:  392 QTC Calculation: 450 R Axis:   78 Text Interpretation:  Sinus rhythm Probable left atrial enlargement RSR' in V1 or V2, right VCD or RVH Nonspecific T abnormalities, lateral  leads No significant change since last tracing Confirmed by Lorre NickAllen, Anthony ([id number]) on 11/19/2018 3:33:37 PM   Radiology Ct Angio Chest Pe W Or Wo Contrast  Result Date: 11/19/2018 CLINICAL DATA:  Shortness of breath for 1 year EXAM: CT ANGIOGRAPHY CHEST WITH CONTRAST TECHNIQUE: Multidetector CT imaging of the chest was performed using the standard protocol during bolus administration of intravenous contrast. Multiplanar CT image reconstructions and MIPs were obtained to evaluate the vascular anatomy. CONTRAST:  100mL OMNIPAQUE IOHEXOL 350 MG/ML SOLN COMPARISON:  CT chest February 17, 2017 FINDINGS: Cardiovascular: There is a optimal opacification of the pulmonary arteries. There is no central,segmental, or subsegmental filling defects within  the pulmonary arteries. There is mild cardiomegaly. No evidence of right ventricular heart strain. There is mitral and aortic valve calcifications. Aortic atherosclerotic calcifications are seen as on the prior exam. Calcifications seen at the origin of the great vessels. There is normal three-vessel brachiocephalic anatomy without proximal stenosis. There is an ectatic ascending thoracic aorta as on prior. Mediastinum/Nodes: No hilar, mediastinal, or axillary adenopathy. Thyroid gland, trachea, and esophagus demonstrate no significant findings. Lungs/Pleura: There is a mild amount of bibasilar dependent atelectasis. No large airspace consolidation or pleural effusion. Upper Abdomen: No acute abnormalities present in the visualized portions of the upper abdomen. Musculoskeletal: No chest wall abnormality. There is worsening compression deformity of the T6 vertebral body with 80% loss of vertebral body height, progressed from the prior radiograph of September 18, 2017. There is unchanged slight superior compression deformities of T11 and T12 with approximately 25% loss in height. Review of the MIP images confirms the above findings. IMPRESSION: 1. No central, segmental, or subsegmental pulmonary embolism. 2. Mild cardiomegaly 3.  Aortic Atherosclerosis (ICD10-I70.0). 4. Worsening in compression deformity of the T6 vertebral body with 80% loss of vertebral body height compared to prior radiograph of September 18, 2017. Electronically Signed   By: Jonna ClarkBindu  Avutu M.D.   On: 11/19/2018 20:49   Dg Chest Port 1 View  Result Date: 11/19/2018 CLINICAL DATA:  Pt complains of SOB x8936yr but states that the reason she came in what because her family "made her" because of her left arm numbness 2-3 days ago EXAM: PORTABLE CHEST 1 VIEW COMPARISON:  02/17/2017 FINDINGS: Heart size is normal. Stable mild elevation of the RIGHT hemidiaphragm. The lungs are free of focal consolidations and pleural effusions. No pulmonary edema. Visualized osseous  structures have a normal appearance. IMPRESSION: No active disease. Electronically Signed   By: Norva PavlovElizabeth  Brown M.D.   On: 11/19/2018 15:45    Procedures Procedures (including critical care time)  Medications Ordered in ED Medications  sodium polystyrene (KAYEXALATE) 15 GM/60ML suspension 15 g (has no administration in time range)  acetaminophen (TYLENOL) tablet 650 mg (has no administration in time range)  ondansetron (ZOFRAN) injection 4 mg (has no administration in time range)  enoxaparin (LOVENOX) injection 40 mg (has no administration in time range)  hydrALAZINE (APRESOLINE) injection 5 mg (has no administration in time range)  aspirin chewable tablet 81 mg (has no administration in time range)  amLODipine (NORVASC) tablet 2.5 mg (has no administration in time range)  isosorbide mononitrate (IMDUR) 24 hr tablet 30 mg (has no administration in time range)  nitroGLYCERIN (NITROSTAT) SL tablet 0.4 mg (has no administration in time range)  famotidine (PEPCID) tablet 20 mg (has no administration in time range)  clopidogrel (PLAVIX) tablet 75 mg (has no administration in time range)  ferrous sulfate tablet 325 mg (has no administration in time range)  vitamin C (ASCORBIC ACID) tablet 250 mg (has no administration in time range)  fluticasone (FLONASE) 50 MCG/ACT nasal spray 1 spray (has no administration in time range)  loratadine (CLARITIN) tablet 10 mg (has no administration in time range)  polyvinyl alcohol (LIQUIFILM TEARS) 1.4 % ophthalmic solution 1 drop (has no administration in time range)  aspirin chewable tablet 324 mg (324 mg Oral Given 11/19/18 1541)  iohexol (OMNIPAQUE) 350 MG/ML injection 100 mL (100 mLs Intravenous Contrast Given 11/19/18 2029)     Initial Impression / Assessment and Plan / ED Course  I have reviewed the triage vital signs and the nursing notes.  Pertinent labs & imaging results that were available during my care of the patient were reviewed by me and  considered in my medical decision making (see chart for details).        No increased work of breathing on presentation. Sensation intact in LUE however quality of sensation abnormal. Intact circulation, no history of trauma. Concern for possible ACS given patient history. Given full dose ASA. EKG without acute ischemic changes. Troponins elevated. Mild hyponatremia. D-dimer positive however negative if age adjusted, low suspicion for PE at this time, feel ACS is more likely, plan to discuss with admitting team. Patient admitted to hospitalist for further evaluation and management.  Patient seen and plan discussed with Dr. Freida Busman.  Final Clinical Impressions(s) / ED Diagnoses   Final diagnoses:  None    ED Discharge Orders    None       Joselyn Arrow Philmore Pali, MD 11/20/18 Estrella Myrtle    Lorre Nick, MD 11/23/18 [phone]

## 2018-11-19 NOTE — ED Triage Notes (Signed)
Patient arrived via [facility]   Pt complains of SOB x54yr but states that the reason she came in what because her family "made her" because of her LA pain.   Pt states she took 2 Nitro earlier today and denies chest pain, N/V.

## 2018-11-19 NOTE — Telephone Encounter (Signed)
New message    Pt c/o Shortness Of Breath: STAT if SOB developed within the last 24 hours or pt is noticeably SOB on the phone  1. Are you currently SOB (can you hear that pt is SOB on the phone)? yes  2. How long have you been experiencing SOB? Patient has had pain for about 1 hour   3. Are you SOB when sitting or when up moving around? Both   4. Are you currently experiencing any other symptoms? Pain running down left arm,

## 2018-11-19 NOTE — ED Notes (Addendum)
Pt is wanting the nurse to pull blood off her IV. RN will be informed after she comes out of room 21. RN Legrand Como to draw

## 2018-11-19 NOTE — ED Provider Notes (Signed)
I saw and evaluated the patient, reviewed the resident's note and I agree with the findings and plan.  EKG: EKG Interpretation  Date/Time:  Friday November 19 2018 15:14:50 EDT Ventricular Rate:  79 PR Interval:    QRS Duration: 97 QT Interval:  392 QTC Calculation: 450 R Axis:   78 Text Interpretation:  Sinus rhythm Probable left atrial enlargement RSR' in V1 or V2, right VCD or RVH Nonspecific T abnormalities, lateral leads No significant change since last tracing Confirmed by Lacretia Leigh ([id number]) on 11/19/2018 3:36:49 PM  83 year old female presents with left arm paresthesias times several days.  No associated chest pain or chest pressure.  Does have a history of coronary disease and states this is like her anginal equivalent.  EKG is unchanged here.  Will cycle troponins and likely admit   Lacretia Leigh, MD 11/19/18 1601

## 2018-11-20 ENCOUNTER — Observation Stay (HOSPITAL_COMMUNITY): Payer: Medicare Other

## 2018-11-20 ENCOUNTER — Other Ambulatory Visit: Payer: Self-pay

## 2018-11-20 DIAGNOSIS — I214 Non-ST elevation (NSTEMI) myocardial infarction: Secondary | ICD-10-CM | POA: Diagnosis present

## 2018-11-20 DIAGNOSIS — D509 Iron deficiency anemia, unspecified: Secondary | ICD-10-CM | POA: Diagnosis present

## 2018-11-20 DIAGNOSIS — R7989 Other specified abnormal findings of blood chemistry: Secondary | ICD-10-CM | POA: Diagnosis not present

## 2018-11-20 DIAGNOSIS — I2510 Atherosclerotic heart disease of native coronary artery without angina pectoris: Secondary | ICD-10-CM

## 2018-11-20 DIAGNOSIS — I5032 Chronic diastolic (congestive) heart failure: Secondary | ICD-10-CM | POA: Diagnosis not present

## 2018-11-20 DIAGNOSIS — I252 Old myocardial infarction: Secondary | ICD-10-CM | POA: Diagnosis not present

## 2018-11-20 DIAGNOSIS — I7300 Raynaud's syndrome without gangrene: Secondary | ICD-10-CM | POA: Diagnosis present

## 2018-11-20 DIAGNOSIS — R200 Anesthesia of skin: Secondary | ICD-10-CM | POA: Diagnosis not present

## 2018-11-20 DIAGNOSIS — E875 Hyperkalemia: Secondary | ICD-10-CM | POA: Diagnosis present

## 2018-11-20 DIAGNOSIS — M3500 Sicca syndrome, unspecified: Secondary | ICD-10-CM | POA: Diagnosis present

## 2018-11-20 DIAGNOSIS — I25119 Atherosclerotic heart disease of native coronary artery with unspecified angina pectoris: Secondary | ICD-10-CM | POA: Diagnosis present

## 2018-11-20 DIAGNOSIS — M79602 Pain in left arm: Secondary | ICD-10-CM | POA: Diagnosis present

## 2018-11-20 DIAGNOSIS — I5033 Acute on chronic diastolic (congestive) heart failure: Secondary | ICD-10-CM | POA: Diagnosis present

## 2018-11-20 DIAGNOSIS — M4802 Spinal stenosis, cervical region: Secondary | ICD-10-CM | POA: Diagnosis present

## 2018-11-20 DIAGNOSIS — Z885 Allergy status to narcotic agent status: Secondary | ICD-10-CM | POA: Diagnosis not present

## 2018-11-20 DIAGNOSIS — R0602 Shortness of breath: Secondary | ICD-10-CM | POA: Diagnosis not present

## 2018-11-20 DIAGNOSIS — Z9861 Coronary angioplasty status: Secondary | ICD-10-CM

## 2018-11-20 DIAGNOSIS — M4722 Other spondylosis with radiculopathy, cervical region: Secondary | ICD-10-CM | POA: Diagnosis present

## 2018-11-20 DIAGNOSIS — R202 Paresthesia of skin: Secondary | ICD-10-CM | POA: Diagnosis present

## 2018-11-20 DIAGNOSIS — G8929 Other chronic pain: Secondary | ICD-10-CM | POA: Diagnosis present

## 2018-11-20 DIAGNOSIS — M797 Fibromyalgia: Secondary | ICD-10-CM | POA: Diagnosis present

## 2018-11-20 DIAGNOSIS — I82402 Acute embolism and thrombosis of unspecified deep veins of left lower extremity: Secondary | ICD-10-CM | POA: Diagnosis not present

## 2018-11-20 DIAGNOSIS — I130 Hypertensive heart and chronic kidney disease with heart failure and stage 1 through stage 4 chronic kidney disease, or unspecified chronic kidney disease: Secondary | ICD-10-CM | POA: Diagnosis present

## 2018-11-20 DIAGNOSIS — E785 Hyperlipidemia, unspecified: Secondary | ICD-10-CM | POA: Diagnosis present

## 2018-11-20 DIAGNOSIS — E871 Hypo-osmolality and hyponatremia: Secondary | ICD-10-CM | POA: Diagnosis present

## 2018-11-20 DIAGNOSIS — I700 Atherosclerosis of aorta: Secondary | ICD-10-CM | POA: Diagnosis present

## 2018-11-20 DIAGNOSIS — R609 Edema, unspecified: Secondary | ICD-10-CM | POA: Diagnosis not present

## 2018-11-20 DIAGNOSIS — M47814 Spondylosis without myelopathy or radiculopathy, thoracic region: Secondary | ICD-10-CM | POA: Diagnosis present

## 2018-11-20 DIAGNOSIS — Z8719 Personal history of other diseases of the digestive system: Secondary | ICD-10-CM | POA: Diagnosis not present

## 2018-11-20 DIAGNOSIS — K219 Gastro-esophageal reflux disease without esophagitis: Secondary | ICD-10-CM | POA: Diagnosis present

## 2018-11-20 DIAGNOSIS — N182 Chronic kidney disease, stage 2 (mild): Secondary | ICD-10-CM | POA: Diagnosis present

## 2018-11-20 DIAGNOSIS — Z888 Allergy status to other drugs, medicaments and biological substances status: Secondary | ICD-10-CM | POA: Diagnosis not present

## 2018-11-20 DIAGNOSIS — Z20828 Contact with and (suspected) exposure to other viral communicable diseases: Secondary | ICD-10-CM | POA: Diagnosis present

## 2018-11-20 LAB — LIPID PANEL
Cholesterol: 192 mg/dL (ref 0–200)
HDL: 76 mg/dL (ref >40)
LDL Cholesterol: 105 mg/dL — ABNORMAL HIGH (ref 0–99)
Total CHOL/HDL Ratio: 2.5 RATIO
Triglycerides: 53 mg/dL (ref <150)
VLDL: 11 mg/dL (ref 0–40)

## 2018-11-20 LAB — BASIC METABOLIC PANEL
Anion gap: 12 (ref 5–15)
BUN: 11 mg/dL (ref 8–23)
CO2: 19 mmol/L — ABNORMAL LOW (ref 22–32)
Calcium: 9.2 mg/dL (ref 8.9–10.3)
Chloride: 101 mmol/L (ref 98–111)
Creatinine, Ser: 1.09 mg/dL — ABNORMAL HIGH (ref 0.44–1.00)
GFR calc Af Amer: 56 mL/min — ABNORMAL LOW (ref >60)
GFR calc non Af Amer: 48 mL/min — ABNORMAL LOW (ref >60)
Glucose, Bld: 77 mg/dL (ref 70–99)
Potassium: 4.7 mmol/L (ref 3.5–5.1)
Sodium: 132 mmol/L — ABNORMAL LOW (ref 135–145)

## 2018-11-20 LAB — RAPID URINE DRUG SCREEN, HOSP PERFORMED
Amphetamines: NOT DETECTED
Barbiturates: NOT DETECTED
Benzodiazepines: NOT DETECTED
Cocaine: NOT DETECTED
Opiates: NOT DETECTED
Tetrahydrocannabinol: NOT DETECTED

## 2018-11-20 LAB — HEPARIN LEVEL (UNFRACTIONATED): Heparin Unfractionated: 0.24 IU/mL — ABNORMAL LOW (ref 0.30–0.70)

## 2018-11-20 LAB — TROPONIN I (HIGH SENSITIVITY)
Troponin I (High Sensitivity): 197 ng/L (ref <18)
Troponin I (High Sensitivity): 246 ng/L (ref <18)
Troponin I (High Sensitivity): 237 ng/L (ref <18)

## 2018-11-20 LAB — SARS CORONAVIRUS 2 (TAT 6-24 HRS): SARS Coronavirus 2: NEGATIVE

## 2018-11-20 LAB — NM MYOCAR MULTI W/SPECT W/WALL MOTION / EF
Exercise duration (min): 4 min
Exercise duration (sec): 30 s
Rest HR: 77 {beats}/min

## 2018-11-20 LAB — OSMOLALITY: Osmolality: 275 mOsm/kg (ref 275–295)

## 2018-11-20 LAB — TSH: TSH: 2.29 u[IU]/mL (ref 0.350–4.500)

## 2018-11-20 LAB — SODIUM, URINE, RANDOM: Sodium, Ur: 72 mmol/L

## 2018-11-20 LAB — HEMOGLOBIN A1C
Hgb A1c MFr Bld: 5.1 % (ref 4.8–5.6)
Mean Plasma Glucose: 99.67 mg/dL

## 2018-11-20 LAB — OSMOLALITY, URINE: Osmolality, Ur: 241 mOsm/kg — ABNORMAL LOW (ref 300–900)

## 2018-11-20 MED ORDER — HEPARIN BOLUS VIA INFUSION
2000.0000 [IU] | Freq: Once | INTRAVENOUS | Status: AC
  Administered 2018-11-20: 2000 [IU] via INTRAVENOUS
  Filled 2018-11-20: qty 2000

## 2018-11-20 MED ORDER — OXYCODONE-ACETAMINOPHEN 5-325 MG PO TABS
1.0000 | ORAL_TABLET | ORAL | Status: DC | PRN
  Filled 2018-11-20: qty 1

## 2018-11-20 MED ORDER — SODIUM CHLORIDE 0.9 % WEIGHT BASED INFUSION: 3.0000 mL/kg/h | INTRAVENOUS | Status: DC

## 2018-11-20 MED ORDER — FUROSEMIDE 10 MG/ML IJ SOLN
20.0000 mg | Freq: Once | INTRAMUSCULAR | Status: AC
  Administered 2018-11-20: 20 mg via INTRAVENOUS
  Filled 2018-11-20: qty 2

## 2018-11-20 MED ORDER — METOPROLOL TARTRATE 12.5 MG HALF TABLET
12.5000 mg | ORAL_TABLET | Freq: Two times a day (BID) | ORAL | Status: DC
  Administered 2018-11-20 – 2018-11-24 (×9): 12.5 mg via ORAL
  Filled 2018-11-20 (×9): qty 1

## 2018-11-20 MED ORDER — FUROSEMIDE 10 MG/ML IJ SOLN: 20.0000 mg | Freq: Every day | INTRAMUSCULAR | Status: DC

## 2018-11-20 MED ORDER — ASPIRIN 81 MG PO CHEW: 81.0000 mg | CHEWABLE_TABLET | ORAL | Status: DC

## 2018-11-20 MED ORDER — REGADENOSON 0.4 MG/5ML IV SOLN
0.4000 mg | Freq: Once | INTRAVENOUS | Status: DC
  Filled 2018-11-20: qty 5

## 2018-11-20 MED ORDER — TECHNETIUM TC 99M TETROFOSMIN IV KIT
10.0000 | PACK | Freq: Once | INTRAVENOUS | Status: AC | PRN
  Administered 2018-11-20: 10 via INTRAVENOUS

## 2018-11-20 MED ORDER — SODIUM CHLORIDE 0.9 % WEIGHT BASED INFUSION
1.0000 mL/kg/h | INTRAVENOUS | Status: DC
  Administered 2018-11-23: 1 mL/kg/h via INTRAVENOUS

## 2018-11-20 MED ORDER — SODIUM CHLORIDE 0.9% FLUSH: 3.0000 mL | INTRAVENOUS | Status: DC | PRN

## 2018-11-20 MED ORDER — REGADENOSON 0.4 MG/5ML IV SOLN
INTRAVENOUS | Status: AC
  Filled 2018-11-20: qty 5

## 2018-11-20 MED ORDER — TECHNETIUM TC 99M TETROFOSMIN IV KIT
30.0000 | PACK | Freq: Once | INTRAVENOUS | Status: AC | PRN
  Administered 2018-11-20: 30 via INTRAVENOUS

## 2018-11-20 MED ORDER — HEPARIN (PORCINE) 25000 UT/250ML-% IV SOLN
700.0000 [IU]/h | INTRAVENOUS | Status: DC
  Administered 2018-11-20: 600 [IU]/h via INTRAVENOUS
  Administered 2018-11-21 – 2018-11-22 (×2): 700 [IU]/h via INTRAVENOUS
  Filled 2018-11-20 (×3): qty 250

## 2018-11-20 MED ORDER — SODIUM CHLORIDE 0.9 % WEIGHT BASED INFUSION
3.0000 mL/kg/h | INTRAVENOUS | Status: DC
  Administered 2018-11-23: 3 mL/kg/h via INTRAVENOUS

## 2018-11-20 MED ORDER — ISOSORBIDE MONONITRATE ER 60 MG PO TB24
60.0000 mg | ORAL_TABLET | Freq: Every day | ORAL | Status: DC
  Administered 2018-11-20 – 2018-11-24 (×5): 60 mg via ORAL
  Filled 2018-11-20 (×5): qty 1

## 2018-11-20 MED ORDER — REGADENOSON 0.4 MG/5ML IV SOLN
0.4000 mg | Freq: Once | INTRAVENOUS | Status: AC
  Administered 2018-11-20: 0.4 mg via INTRAVENOUS
  Filled 2018-11-20: qty 5

## 2018-11-20 MED ORDER — ASPIRIN 81 MG PO CHEW
81.0000 mg | CHEWABLE_TABLET | ORAL | Status: AC
  Administered 2018-11-23: 81 mg via ORAL
  Filled 2018-11-20: qty 1

## 2018-11-20 MED ORDER — SODIUM CHLORIDE 0.9 % WEIGHT BASED INFUSION: 1.0000 mL/kg/h | INTRAVENOUS | Status: DC

## 2018-11-20 NOTE — Progress Notes (Signed)
ANTICOAGULATION CONSULT NOTE - Initial Consult  Pharmacy Consult for heparin Indication: chest pain/ACS  Allergies  Allergen Reactions  . Ezetimibe Other (See Comments)    Muscle pain= zetia  . Ezetimibe-Simvastatin Other (See Comments)    Muscle pain= vytorin  . Morphine Other (See Comments)    nightmares  . Rosuvastatin Other (See Comments)    Muscle pain  . Ambien [Zolpidem Tartrate] Other (See Comments)    Fell down  . Plaquenil [Hydroxychloroquine Sulfate] Other (See Comments)    Doesn't recall reaction  . Polytrim [Polymyxin B-Trimethoprim] Other (See Comments)    unknown  . Prevacid [Lansoprazole] Other (See Comments)    dizziness    Patient Measurements: Height: 5\' 2"  (157.5 cm) Weight: 110 lb (49.9 kg) IBW/kg (Calculated) : 50.1 Heparin Dosing Weight: 49.9 kg  Vital Signs: Temp: 98.4 F (36.9 C) (09/04 2314) Temp Source: Oral (09/04 2314) BP: 142/81 (09/04 2314) Pulse Rate: 92 (09/04 2314)  Labs: Recent Labs    11/19/18 1528 11/19/18 1728 11/19/18 2130 11/19/18 2355  HGB 11.2*  --   --   --   HCT 35.3*  --   --   --   PLT 202  --   --   --   CREATININE 1.09*  --   --   --   TROPONINIHS 80* 85* 81* 197*    Estimated Creatinine Clearance: 32.4 mL/min (A) (by C-G formula based on SCr of 1.09 mg/dL (H)).   Medical History: Past Medical History:  Diagnosis Date  . Abnormal nuclear stress test 05/24/2015  . Atypical ductal hyperplasia of right breast 02/22/2014  . C. difficile colitis - recurrent 06/23/2016  . C. difficile diarrhea on po vanc 10/04/2015  . CAD (coronary artery disease) 2006, 2016   a. 08/2014 NSTEMI: Attempted PCI of 85% RPDA - unable to wire, complicated by dissection, case aborted;  b. 08/2014 Relook Cath: LM nl, LAD 60p, SP1 90ost (small), D1 65ost, RI 45/50, RCA nl, RPDA 90 - healed dissection;  b. 08/2014 Echo: EF 55%, mild/mon midapical inferior HK, mild AI, PASP . c. Cath 05/2015 with CTO of the right-PDA and unsuccessful PCI  .  CAD S/P unsuccessful PCI 08/18/14 04/20/2007  . chronic back   . CKD (chronic kidney disease), stage II   . COLONIC POLYPS, ADENOMATOUS, HX OF   . Diverticulitis   . Dyslipidemia    a. statin intolerant.  [name] Dyspnea    with exertion  . Elevated troponin level 10/04/2015  . ENDOMETRIOSIS   . Essential hypertension   . Fibromyalgia   . GASTRIC ANTRAL VASCULAR ECTASIA   . GERD   . History of hiatal hernia   . Hypertension, uncontrolled 10/04/2015  . Insomnia   . IRON DEFICIENCY ANEMIA SECONDARY TO BLOOD LOSS   . Myocardial infarction (HCC) 08/2014  . Neuropathy   . NSTEMI (non-ST elevated myocardial infarction) (HCC) 08/18/2014  . Osteoarthritis    "back, hips, basically all over" (05/24/2015)  . Palpitations 03/06/2011  . Paresthesia 08/01/2014  . Raynaud's syndrome   . Rosacea, acne   . Sicca syndrome (HCC)   . SJOGREN'S SYNDROME   . SUPRAVENTRICULAR TACHYCARDIA   . Vitamin D deficiency     Medications:  Medications Prior to Admission  Medication Sig Dispense Refill Last Dose  . acetaminophen (TYLENOL) 500 MG tablet Take 1,000 mg by mouth 3 (three) times daily.   11/19/2018 at Unknown time  . amLODipine (NORVASC) 2.5 MG tablet Take 2.5 mg by mouth daily.  11/19/2018 at Unknown time  . aspirin 81 MG tablet Take 81 mg by mouth daily.   11/19/2018 at 0830  . clopidogrel (PLAVIX) 75 MG tablet TAKE 1 TABLET BY MOUTH ONCE DAILY WITH BREAKFAST (Patient taking differently: Take 75 mg by mouth daily. ) 90 tablet 3 11/19/2018 at 0830  . famotidine (PEPCID) 20 MG tablet Take 1 tablet (20 mg total) by mouth daily. 30 tablet 1 11/18/2018 at Unknown time  . feeding supplement, ENSURE ENLIVE, (ENSURE ENLIVE) LIQD Take 237 mLs by mouth 2 (two) times daily between meals.   unknown  . ferrous sulfate 325 (65 FE) MG tablet Take 325 mg by mouth as needed. Stopped 11/08/16   11/19/2018 at Unknown time  . fluticasone (FLONASE ALLERGY RELIEF) 50 MCG/ACT nasal spray Place 1 spray into the nose daily as needed for  allergies.   unknown  . furosemide (LASIX) 20 MG tablet Take 1 tablet (20 mg total) by mouth daily. 90 tablet 3 unknown  . isosorbide mononitrate (IMDUR) 30 MG 24 hr tablet Take 1 tablet by mouth once daily 90 tablet 3 11/19/2018 at Unknown time  . loratadine (CLARITIN) 10 MG tablet Take 10 mg by mouth daily.   unknown  . Multiple Vitamins-Minerals (EMERGEN-C VITAMIN C PO) Take 1 tablet by mouth daily.   11/19/2018 at Unknown time  . nitroGLYCERIN (NITROSTAT) 0.4 MG SL tablet Place 1 tablet (0.4 mg total) under the tongue every 5 (five) minutes as needed for chest pain. 25 tablet 1 11/19/2018 at Unknown time  . Polyvinyl Alcohol-Povidone (REFRESH OP) Place 1 drop into both eyes 2 (two) times daily.    11/19/2018 at Unknown time    Assessment: 83 yo lady to start heparin for CP.  Hg 11.2, PTLC 202 Goal of Therapy:  Heparin level 0.3-0.7 units/ml Monitor platelets by anticoagulation protocol: Yes   Plan:  Heparin 2000 unit bolus and drip at 600 units/hr Check heparin level 8 hr after start Daily HL and CBC Monitor for bleeding complications  Thanks for allowing pharmacy to be a part of this patient's care.  Excell Seltzer, PharmD Clinical Pharmacist 11/20/2018,3:57 AM

## 2018-11-20 NOTE — Consult Note (Addendum)
Cardiology Consultation:   Patient ID: [name] MRN: [id number]; DOB: 06/16/1935  Admit date: 11/19/2018 Date of Consult: 11/20/2018  Primary Care Provider: Laurann MontanaWhite, Cynthia, MD Primary Cardiologist: Tonny BollmanMichael Cooper, MD  Primary Electrophysiologist:  None    Patient Profile:   [name] LimaBetty S Ruberg is a 83 y.o. female with a hx of CAD who is being seen today for the evaluation of elevated Troponin and Lt arm numbjness at the request of Dr Jomarie LongsJoseph  History of Present Illness:   Ms. [name] is a pleasant 83 year old female followed by Dr. Excell Seltzerooper.  She lives at home with her husband in their own house.  Her son is a Insurance account managerneurologist in [place].  The patient has a history of coronary disease.  In June 2016 she had an NSTEMI treated with an attempted PCI of a right PDA.  This was complicated by wire dissection and the case was aborted.  Relook catheterization showed normal left main, 60% proximal LAD, 90% septal perforator, 65% diagonal, 50% ramus intermedius, normal RCA with a 90% right PDA and heel dissection.  Her ejection fraction was 55%.  Catheterization was done again in March 2017 with an attempted PCI which was unsuccessful to the PDA.  She has had problems over the years with chest pain and shortness of breath.  Dr. Excell Seltzerooper saw her in January this year.  Echocardiogram April 19, 2018 showed an ejection fraction of 55 to 60%.  There was normal LV wall thickness.  There was evidence of pseudo-normal diastolic filling.  She had elevated LVEDP.  She was admitted last evening with complaints of shortness of breath and left arm numbness.  She primarily complained to me of left arm numbness this morning when I examined her.  She denies any significant chest pain.  She says her symptoms started yesterday evening.  She took a nitroglycerin with no relief.  She waited a couple hours and took a second.  When she got through to the office on call Fannie KneeSue was suggested she come to the emergency room.  In the emergency  room her troponin was 80, 85, 197, and her last troponin was 246. Chest CTA was negative for PE.  She had no acute EKG changes. Her BNP was 794.  She had no heart failure on chest x-ray.  Because of initial sodium of 129 she was not given a diuretic.  This morning she denies chest pain or shortness of breath.  Her main complaint continues to be some left arm numbness.  An MRI of her C-spine did suggest significant DJD and moderate stenosis.   Heart Pathway Score:     Past Medical History:  Diagnosis Date   Abnormal nuclear stress test 05/24/2015   Atypical ductal hyperplasia of right breast 02/22/2014   C. difficile colitis - recurrent 06/23/2016   C. difficile diarrhea on po vanc 10/04/2015   CAD (coronary artery disease) 2006, 2016   a. 08/2014 NSTEMI: Attempted PCI of 85% RPDA - unable to wire, complicated by dissection, case aborted;  b. 08/2014 Relook Cath: LM nl, LAD 60p, SP1 90ost (small), D1 65ost, RI 45/50, RCA nl, RPDA 90 - healed dissection;  b. 08/2014 Echo: EF 55%, mild/mon midapical inferior HK, mild AI, PASP 43mmHg. c. Cath 05/2015 with CTO of the right-PDA and unsuccessful PCI   CAD S/P unsuccessful PCI 08/18/14 04/20/2007   chronic back    CKD (chronic kidney disease), stage II    COLONIC POLYPS, ADENOMATOUS, HX OF    Diverticulitis    Dyslipidemia  a. statin intolerant.   Dyspnea    with exertion   Elevated troponin level 10/04/2015   ENDOMETRIOSIS    Essential hypertension    Fibromyalgia    GASTRIC ANTRAL VASCULAR ECTASIA    GERD    History of hiatal hernia    Hypertension, uncontrolled 10/04/2015   Insomnia    IRON DEFICIENCY ANEMIA SECONDARY TO BLOOD LOSS    Myocardial infarction (HCC) 08/2014   Neuropathy    NSTEMI (non-ST elevated myocardial infarction) (HCC) 08/18/2014   Osteoarthritis    "back, hips, basically all over" (05/24/2015)   Palpitations 03/06/2011   Paresthesia 08/01/2014   Raynaud's syndrome    Rosacea, acne    Sicca syndrome  (HCC)    SJOGREN'S SYNDROME    SUPRAVENTRICULAR TACHYCARDIA    Vitamin D deficiency     Past Surgical History:  Procedure Laterality Date   ABDOMINAL HYSTERECTOMY     left right ovary   APPENDECTOMY     BREAST EXCISIONAL BIOPSY Right    BREAST EXCISIONAL BIOPSY Right    BREAST LUMPECTOMY WITH RADIOACTIVE SEED LOCALIZATION Right 01/24/2014   Procedure: BREAST LUMPECTOMY WITH RADIOACTIVE SEED LOCALIZATION;  Surgeon: Avel Peaceodd Rosenbower, MD;  Location: [facility];  Service: General;  Laterality: Right;   CARDIAC CATHETERIZATION N/A 08/18/2014   Procedure: Left Heart Cath and Coronary Angiography;  Surgeon: Marykay Lexavid W Harding, MD;  [facility];  Service: Cardiovascular;  Laterality: N/A;   CARDIAC CATHETERIZATION  08/18/2014   Procedure: Coronary Balloon Angioplasty;  Surgeon: Marykay Lexavid W Harding, MD;  [facility];  Service: Cardiovascular;;   CARDIAC CATHETERIZATION N/A 08/21/2014   Procedure: Left Heart Cath and Coronary Angiography;  Surgeon: Marykay Lexavid W Harding, MD;  [facility];  Service: Cardiovascular;  Laterality: N/A;   CARDIAC CATHETERIZATION  2006   CARDIAC CATHETERIZATION N/A 05/24/2015   Procedure: Left Heart Cath and Coronary Angiography;  Surgeon: Tonny BollmanMichael Cooper, MD;  [facility];  Service: Cardiovascular;  Laterality: N/A;   COLONOSCOPY W/ BIOPSIES AND POLYPECTOMY  12/17/2006   adenomatous polyps, external hemorrhoids   ESOPHAGOGASTRODUODENOSCOPY  11/27/2009   [facility] tretment of lesions   EYE SURGERY     HERNIA REPAIR     HIATAL HERNIA REPAIR  2005   and paraesophageal   LAPAROSCOPIC CHOLECYSTECTOMY     TEAR DUCT PROBING Left    "no plugs or anything"     Home Medications:  Prior to Admission medications   Medication Sig Start Date End Date Taking? Authorizing Provider  acetaminophen (TYLENOL) 500 MG tablet Take 1,000 mg by mouth 3 (three) times daily.   Yes [provider]   amLODipine (NORVASC) 2.5 MG tablet Take 2.5 mg by mouth daily.   Yes [provider]  aspirin 81 MG tablet Take 81 mg by mouth daily.   Yes [provider]  clopidogrel (PLAVIX) 75 MG tablet TAKE 1 TABLET BY MOUTH ONCE DAILY WITH BREAKFAST Patient taking differently: Take 75 mg by mouth daily.  10/21/17  Yes Tonny Bollmanooper, Michael, MD  famotidine (PEPCID) 20 MG tablet Take 1 tablet (20 mg total) by mouth daily. 06/25/16  Yes Vassie LollMadera, Carlos, MD  feeding supplement, ENSURE ENLIVE, (ENSURE ENLIVE) LIQD Take 237 mLs by mouth 2 (two) times daily between meals. 06/24/16  Yes Vassie LollMadera, Carlos, MD  ferrous sulfate 325 (65 FE) MG tablet Take 325 mg by mouth as needed. Stopped 11/08/16   Yes [provider]  fluticasone (FLONASE ALLERGY RELIEF) 50 MCG/ACT  nasal spray Place 1 spray into the nose daily as needed for allergies.   Yes [provider]  furosemide (LASIX) 20 MG tablet Take 1 tablet (20 mg total) by mouth daily. 04/21/18 04/16/19 Yes Tonny Bollman, MD  isosorbide mononitrate (IMDUR) 30 MG 24 hr tablet Take 1 tablet by mouth once daily 06/02/18  Yes Tonny Bollman, MD  loratadine (CLARITIN) 10 MG tablet Take 10 mg by mouth daily.   Yes [provider]  Multiple Vitamins-Minerals (EMERGEN-C VITAMIN C PO) Take 1 tablet by mouth daily.   Yes [provider]  nitroGLYCERIN (NITROSTAT) 0.4 MG SL tablet Place 1 tablet (0.4 mg total) under the tongue every 5 (five) minutes as needed for chest pain. 06/12/15  Yes Tonny Bollman, MD  Polyvinyl Alcohol-Povidone (REFRESH OP) Place 1 drop into both eyes 2 (two) times daily.    Yes [provider]    Inpatient Medications: Scheduled Meds:  amLODipine  2.5 mg Oral Daily   aspirin  81 mg Oral Daily   clopidogrel  75 mg Oral Daily   famotidine  20 mg Oral Daily   ferrous sulfate  325 mg Oral Q breakfast   isosorbide mononitrate  30 mg Oral Daily   loratadine  10 mg Oral Daily   polyvinyl alcohol  1  drop Both Eyes BID   vitamin C  250 mg Oral Daily   Continuous Infusions:  heparin 600 Units/hr (11/20/18 0515)   PRN Meds: acetaminophen, fluticasone, hydrALAZINE, nitroGLYCERIN, ondansetron (ZOFRAN) IV, oxyCODONE-acetaminophen  Allergies:    Allergies  Allergen Reactions   Ezetimibe Other (See Comments)    Muscle pain= zetia   Ezetimibe-Simvastatin Other (See Comments)    Muscle pain= vytorin   Morphine Other (See Comments)    nightmares   Rosuvastatin Other (See Comments)    Muscle pain   Ambien [Zolpidem Tartrate] Other (See Comments)    Fell down   Plaquenil [Hydroxychloroquine Sulfate] Other (See Comments)    Doesn't recall reaction   Polytrim [Polymyxin B-Trimethoprim] Other (See Comments)    unknown   Prevacid [Lansoprazole] Other (See Comments)    dizziness    Social History:   Social History   Socioeconomic History   Marital status: Married    Spouse name: Not on file   Number of children: 3   Years of education: college   Highest education level: Not on file  Occupational History   Occupation: retired  Ecologist strain: Not on file   Food insecurity    Worry: Not on file    Inability: Not on Occupational hygienist needs    Medical: Not on file    Non-medical: Not on file  Tobacco Use   Smoking status: Never Smoker   Smokeless tobacco: Never Used  Substance and Sexual Activity   Alcohol use: Yes    Comment: occasional wine   Drug use: No   Sexual activity: Not Currently  Lifestyle   Physical activity    Days per week: Not on file    Minutes per session: Not on file   Stress: Not on file  Relationships   Social connections    Talks on phone: Not on file    Gets together: Not on file    Attends religious service: Not on file    Active member of club or organization: Not on file    Attends meetings of clubs or organizations: Not on file    Relationship status: Not on file  Intimate partner  violence    Fear of current or ex partner: Not on file    Emotionally abused: Not on file    Physically abused: Not on file    Forced sexual activity: Not on file  Other Topics Concern   Not on file  Social History Narrative   Patient drinks caffeine occasionally.   Patient is right handed.    Family History:   Family History  Problem Relation Age of Onset   Heart failure Mother    Diabetes Mother    Kidney failure Mother    Hypertension Mother    Coronary artery disease Mother    Heart disease Mother    Hyperlipidemia Mother    Peripheral vascular disease Mother        amputation   Breast cancer Sister        x 3 sisters   Cancer Sister    Heart disease Father    Hypertension Father    Coronary artery disease Father    Heart attack Father    Coronary artery disease Brother    Heart disease Brother    Coronary artery disease Brother    Coronary artery disease Brother    Breast cancer Sister    Breast cancer Sister    Breast cancer Daughter    Cancer Daughter    Colon cancer Neg Hx    Esophageal cancer Neg Hx    Stomach cancer Neg Hx    Rectal cancer Neg Hx      ROS:  Please see the history of present illness.  All other ROS reviewed and negative.     Physical Exam/Data:   Vitals:   11/19/18 2000 11/19/18 2314 11/20/18 0607 11/20/18 0805  BP: (!) 160/77 (!) 142/81 118/78 134/80  Pulse:  92 77 82  Resp: 14 17 15 20   Temp:  98.4 F (36.9 C) 98.2 F (36.8 C) 98.5 F (36.9 C)  TempSrc:  Oral Oral Oral  SpO2:  95% 95% 99%  Weight:  53.2 kg 52 kg   Height:        Intake/Output Summary (Last 24 hours) at 11/20/2018 0904 Last data filed at 11/20/2018 0515 Gross per 24 hour  Intake 32.33 ml  Output 850 ml  Net -817.67 ml   Last 3 Weights 11/20/2018 11/19/2018 11/19/2018  Weight (lbs) 114 lb 9.6 oz 117 lb 3.2 oz 110 lb  Weight (kg) 51.982 kg 53.162 kg 49.896 kg     Body mass index is 20.96 kg/m.  General:  Well nourished, well  developed, in no acute distress HEENT: normal Lymph: no adenopathy Neck: no JVD Endocrine:  No thryomegaly Vascular: No carotid bruits; FA pulses 2+ bilaterally without bruits  Cardiac:  normal S1, S2; RRR; no murmur  Lungs:  clear to auscultation bilaterally, no wheezing, rhonchi or rales  Abd: soft, nontender, no hepatomegaly  Ext: no edema Musculoskeletal:  No deformities, BUE and BLE strength normal and equal Skin: warm and dry  Neuro:  CNs 2-12 intact, no focal abnormalities noted Psych:  Normal affect   EKG:  The EKG was personally reviewed and demonstrates:  NSR- no acute changes Telemetry:  Telemetry was personally reviewed and demonstrates:  NSR  Relevant CV Studies: Echo Feb 2020- IMPRESSIONS    1. The left ventricle has normal systolic function of 89-21%. The cavity size is normal. There is normal left ventricular wall thickness. Echo evidence of pseudonormal diastolic filling patterns. Elevated left ventricular end-diastolic pressure.  2. The right ventricle  is mildly enlarged in size. There is normal systolic function. Right ventricular systolic pressure is mildly elevated with an estimated pressure of 36.9 mmHg.  3. Normal left atrial size.  4. Mildly dilated right atrial size.  5. The mitral valve normal in structure. There is mild thickening and mildly calcified of the anterior leaflet. There is severe mitral annular calcification present. Regurgitation is mild by color flow Doppler.  6. Tricuspid regurgitation is mild.  7. The aortic valve tricuspid. There is severe thickening and severely calcified of the aortic valve with moderately decreased cusp excursion. Aortic valve regurgitation is mild to moderate by color flow Doppler. The jet is eccentric towards the  anterior MV leaflet. The calculated aortic valve area is 1.46 cm consistent with mild stenosis.  8. The inferior vena cava was dilated in size with <50% respiratory variablity.  9. Compared to prior echo  the PASP has decreased from to .   Laboratory Data:  High Sensitivity Troponin:   Recent Labs  Lab 11/19/18 1528 11/19/18 1728 11/19/18 2130 11/19/18 2355 11/20/18 0651  TROPONINIHS 80* 85* 81* 197* 246*     Chemistry Recent Labs  Lab 11/19/18 1528 11/20/18 0651  NA 129* 132*  K 5.4* 4.7  CL 99 101  CO2 21* 19*  GLUCOSE 92 77  BUN 11 11  CREATININE 1.09* 1.09*  CALCIUM 9.6 9.2  GFRNONAA 48* 48*  GFRAA 56* 56*  ANIONGAP 9 12    No results for input(s): PROT, ALBUMIN, AST, ALT, ALKPHOS, BILITOT in the last 168 hours. Hematology Recent Labs  Lab 11/19/18 1528  WBC 5.5  RBC 4.01  HGB 11.2*  HCT 35.3*  MCV 88.0  MCH 27.9  MCHC 31.7  RDW 15.4  PLT 202   BNP Recent Labs  Lab 11/19/18 2130  BNP 794.7*    DDimer  Recent Labs  Lab 11/19/18 1528  DDIMER 0.57*     Radiology/Studies:  Ct Angio Chest Pe W Or Wo Contrast  Result Date: 11/19/2018 CLINICAL DATA:  Shortness of breath for 1 year EXAM: CT ANGIOGRAPHY CHEST WITH CONTRAST TECHNIQUE: Multidetector CT imaging of the chest was performed using the standard protocol during bolus administration of intravenous contrast. Multiplanar CT image reconstructions and MIPs were obtained to evaluate the vascular anatomy. CONTRAST:  OMNIPAQUE IOHEXOL 350 MG/ML SOLN COMPARISON:  CT chest February 17, 2017 FINDINGS: Cardiovascular: There is a optimal opacification of the pulmonary arteries. There is no central,segmental, or subsegmental filling defects within the pulmonary arteries. There is mild cardiomegaly. No evidence of right ventricular heart strain. There is mitral and aortic valve calcifications. Aortic atherosclerotic calcifications are seen as on the prior exam. Calcifications seen at the origin of the great vessels. There is normal three-vessel brachiocephalic anatomy without proximal stenosis. There is an ectatic ascending thoracic aorta as on prior. Mediastinum/Nodes: No hilar, mediastinal, or  axillary adenopathy. Thyroid gland, trachea, and esophagus demonstrate no significant findings. Lungs/Pleura: There is a mild amount of bibasilar dependent atelectasis. No large airspace consolidation or pleural effusion. Upper Abdomen: No acute abnormalities present in the visualized portions of the upper abdomen. Musculoskeletal: No chest wall abnormality. There is worsening compression deformity of the T6 vertebral body with 80% loss of vertebral body height, progressed from the prior radiograph of September 18, 2017. There is unchanged slight superior compression deformities of T11 and T12 with approximately 25% loss in height. Review of the MIP images confirms the above findings. IMPRESSION: 1. No central, segmental, or subsegmental pulmonary embolism. 2.  Mild cardiomegaly 3.  Aortic Atherosclerosis (ICD10-I70.0). 4. Worsening in compression deformity of the T6 vertebral body with 80% loss of vertebral body height compared to prior radiograph of September 18, 2017. Electronically Signed   By: Jonna Clark M.D.   On: 11/19/2018 20:49   Mr Brain Wo Contrast  Result Date: 11/20/2018 CLINICAL DATA:  Initial evaluation for focal neural deficit, stroke suspected. EXAM: MRI HEAD WITHOUT CONTRAST TECHNIQUE: Multiplanar, multiecho pulse sequences of the brain and surrounding structures were obtained without intravenous contrast. COMPARISON:  Prior MRI from 01/30/2017. FINDINGS: Brain: Mild diffuse prominence of the CSF containing spaces compatible with generalized age-related cerebral atrophy. Patchy and confluent T2/FLAIR hyperintensity within the periventricular deep white matter both cerebral hemispheres most consistent with chronic small vessel ischemic disease, moderate nature. Superimposed small remote cortical infarct present at the right parietal lobe. Few small remote bilateral cerebellar infarcts. Tiny remote lacunar infarcts noted within the thalami. No abnormal foci of restricted diffusion to suggest acute or  subacute ischemia. Gray-white matter differentiation maintained. No other areas of remote cortical infarction. No foci of susceptibility artifact to suggest acute or chronic intracranial hemorrhage. No mass lesion, midline shift or mass effect. No hydrocephalus. No extra-axial fluid collection. Pituitary gland suprasellar region normal. Midline structures intact. Vascular: Major intracranial vascular flow voids maintained. Skull and upper cervical spine: Craniocervical junction within normal limits. Prominent degenerative spondylolysis noted at C5-6 without significant stenosis. Bone marrow signal intensity normal. No scalp soft tissue abnormality. Sinuses/Orbits: Patient status post ocular lens replacement on the right. Globes and orbital soft tissues demonstrate no acute finding. Paranasal sinuses are clear. No mastoid effusion. Inner ear structures normal. Other: None. IMPRESSION: 1. No acute intracranial abnormality. 2. Age-related cerebral atrophy with moderate chronic microvascular ischemic disease, with superimposed remote infarcts involving the right parietal lobe, bilateral thalami, and cerebellum. Electronically Signed   By: Rise Mu M.D.   On: 11/20/2018 03:26   Mr Cervical Spine Wo Contrast  Result Date: 11/20/2018 CLINICAL DATA:  Initial evaluation for radiculopathy. Left arm numbness. EXAM: MRI CERVICAL SPINE WITHOUT CONTRAST TECHNIQUE: Multiplanar, multisequence MR imaging of the cervical spine was performed. No intravenous contrast was administered. COMPARISON:  Prior radiograph from 08/19/2017 FINDINGS: Alignment: Mild exaggeration of the normal cervical lordosis. Trace grade 1 anterolisthesis of C6 on C7, C7 on T1 and T1 on T2, chronic and facet mediated. Vertebrae: Vertebral body height maintained without evidence for acute or chronic fracture. Bone marrow signal intensity within normal limits. No discrete or worrisome osseous lesions. Prominent reactive endplate changes noted about  the C5-6 interspace. No other abnormal marrow edema. Cord: Signal intensity within the cervical spinal cord is normal. Posterior Fossa, vertebral arteries, paraspinal tissues: Visualized brain and posterior fossa within normal limits. Craniocervical junction normal. Paraspinous and prevertebral soft tissues within normal limits. Normal intravascular flow voids seen within the vertebral arteries bilaterally. Disc levels: C2-C3: Chronic intervertebral disc space narrowing without significant disc bulge. No significant canal or foraminal stenosis. C3-C4: Chronic intervertebral disc space narrowing. Left paracentral disc osteophyte indents the ventral thecal sac, contacting the left ventral cord. Mild spinal stenosis without cord deformity. Left C3-4 facet is ankylosed. No significant foraminal narrowing. C4-C5: Chronic intervertebral disc space narrowing with diffuse degenerative disc osteophyte, asymmetric to the left. Flattening and partial effacement of the ventral thecal sac with resultant mild spinal stenosis. No cord deformity. Left greater than right facet hypertrophy. Moderate left worse than right C5 foraminal narrowing. C5-C6: Chronic intervertebral disc space narrowing with diffuse degenerative disc osteophyte.  Associated prominent reactive endplate changes. Associated broad posterior component partially effaces the ventral thecal sac, contacting the ventral spinal cord. Mild spinal stenosis without cord deformity. Moderate bilateral C6 foraminal narrowing. C6-C7: Trace anterolisthesis. Chronic intervertebral disc space narrowing with mild disc bulge. Right worse than left facet hypertrophy. No significant spinal stenosis. Foramina remain patent. C7-T1: Mild anterolisthesis. Associated broad posterior pseudo disc bulge/uncovering. Bilateral facet hypertrophy. No canal or foraminal stenosis. Visualized upper thoracic spine demonstrates no significant finding. IMPRESSION: 1. Left paracentral disc osteophyte at  C3-4, contacting the left ventral spinal cord and resulting in mild spinal stenosis. The ventral left C4 nerve root could be affected. 2. Degenerative disc osteophyte with facet degeneration at C4-5 and C5-6 with resultant mild canal with moderate bilateral C5 and C6 foraminal stenosis, worse on the left. Either of the C5 or C6 nerve roots could be affected. 3. Prominent discogenic reactive endplate changes about the C5-6 interspace, which could contribute to underlying neck pain. Electronically Signed   By: Rise MuBenjamin  McClintock M.D.   On: 11/20/2018 04:55   Dg Chest Port 1 View  Result Date: 11/19/2018 CLINICAL DATA:  Pt complains of SOB [id number] but states that the reason she came in what because her family "made her" because of her left arm numbness 2-3 days ago EXAM: PORTABLE CHEST 1 VIEW COMPARISON:  02/17/2017 FINDINGS: Heart size is normal. Stable mild elevation of the RIGHT hemidiaphragm. The lungs are free of focal consolidations and pleural effusions. No pulmonary edema. Visualized osseous structures have a normal appearance. IMPRESSION: No active disease. Electronically Signed   By: Norva PavlovElizabeth  Brown M.D.   On: 11/19/2018 15:45    Assessment and Plan:   Troponin elevation- NSTEMI vs demand ischemia secondary to acute on chronic diastolic CHF  Acute on chronic diastolic CHF- Elevated BNP, she did have elevated LVEDP in Feb on echo  CAD- NSTEM 2016- failed RPDA PCI and again in 2017 Moderate residual CAD  C-spine and T-spine DJD- See MRI and CT report. Lt arm pain may be radiculopathy   Plan: IV Heparin added by attending.  Will increase nitrate and give one dose of IV Lasix.  She is NPO- not sure cath or Myoview appropriate now- MD to see.       For questions or updates, please contact [facility] Please consult [email or web address] for contact info under     Signed, Corine ShelterLuke Kilroy, PA-C  11/20/2018 9:04 AM   Patient examined chart reviewed Exam with elderly white female Chapped lips  normal UE pulses no focal neuro signs.weakness. Agree with heparin given elevated troponin and known CAD She is not keen to repeat cath due To failure of PCI last time Will risk stratify with myovue this am if possible. F/U primary service with MRI C spine as "numbness" in LUE may be radiculopathy  Charlton HawsPeter Nashalie Sallis

## 2018-11-20 NOTE — Progress Notes (Signed)
PROGRESS NOTE    [name] LimaBetty S [name]  ZOX:[id number] DOB: 04/14/1935 DOA: 11/19/2018 PCP: Laurann MontanaWhite, Cynthia, MD  Brief Narrative: 83 year old female with history of CAD, right PDA occlusion with unsuccessful attempt at PCI, continues to have problems with chest pain and shortness of breath, noted to have severe calcification of the aortic and mitral valve, no comment on MS or AS on echocardiogram. -Presented to the ED yesterday evening with shortness of breath and left arm numbness -Reports ongoing dyspnea on exertion for months  -Her high-sensitivity troponin was noted to be elevated, EKG was unchanged, she was started on IV heparin and cardiology was consulted overnight   Assessment & Plan:   Non-STEMI versus demand ischemia -Has known CAD, followed by Dr. Excell Seltzerooper on medical management -Failed PCI of PDA in 2016 and again in 2017, moderate residual CAD -High-sensitivity troponin trending up from 81 to 2 47 -Cardiology following -Recommend to continue IV heparin -Continue aspirin Plavix, statin -Unclear why she is not on a beta-blocker, will start low-dose and monitor  Acute on chronic diastolic CHF -She may be slightly volume overloaded -I also wonder if she has valvular heart disease will discuss with cardiology, severe calcification of mitral and aortic valves reported on the last echo -IV Lasix x1 today  Left arm numbness -Suspect this is secondary to radiculopathy from C5-C6 cervical disc disease and spinal stenosis -Physical therapy consult -Supportive care only  Chronic iron deficiency anemia -Continue oral iron  Hyponatremia -Monitor with diuresis, she is asymptomatic  DVT prophylaxis: Currently on IV heparin Code Status: Full code Family Communication: No family at bedside Disposition Plan: Home pending above work-up  Consultants:   Cardiology   Procedures:   Antimicrobials:    Subjective: -Complains of left arm numbness, reports chronic chest pain and dyspnea  with activity  Objective: Vitals:   11/19/18 2314 11/20/18 0607 11/20/18 0805 11/20/18 0943  BP: (!) 142/81 118/78 134/80 128/70  Pulse: 92 77 82 79  Resp: 17 15 20    Temp: 98.4 F (36.9 C) 98.2 F (36.8 C) 98.5 F (36.9 C)   TempSrc: Oral Oral Oral   SpO2: 95% 95% 99% 100%  Weight: 53.2 kg 52 kg    Height:        Intake/Output Summary (Last 24 hours) at 11/20/2018 1151 Last data filed at 11/20/2018 1000 Gross per 24 hour  Intake 32.33 ml  Output 1300 ml  Net -1267.67 ml   Filed Weights   11/19/18 1647 11/19/18 2314 11/20/18 0607  Weight: 49.9 kg 53.2 kg 52 kg    Examination:  General exam: AAO x3, no distress Respiratory system: Decreased breath sounds at bases, otherwise clear Cardiovascular system: S1 & S2 heard, RRR.  Gastrointestinal system: Abdomen is nondistended, soft and nontender.Normal bowel sounds heard. Central nervous system: AAO x3, motor strength in proximal and distal muscle groups in all extremities, mild numbness noted along C5 distribution in the left arm Extremities: Trace edema Skin: No rashes, lesions or ulcers Psychiatry: Judgement and insight appear normal. Mood & affect appropriate.     Data Reviewed:   CBC: Recent Labs  Lab 11/19/18 1528  WBC 5.5  HGB 11.2*  HCT 35.3*  MCV 88.0  PLT 202   Basic Metabolic Panel: Recent Labs  Lab 11/19/18 1528 11/20/18 0651  NA 129* 132*  K 5.4* 4.7  CL 99 101  CO2 21* 19*  GLUCOSE 92 77  BUN 11 11  CREATININE 1.09* 1.09*  CALCIUM 9.6 9.2   GFR: Estimated Creatinine Clearance:  32.6 mL/min (A) (by C-G formula based on SCr of 1.09 mg/dL (H)). Liver Function Tests: No results for input(s): AST, ALT, ALKPHOS, BILITOT, PROT, ALBUMIN in the last 168 hours. No results for input(s): LIPASE, AMYLASE in the last 168 hours. No results for input(s): AMMONIA in the last 168 hours. Coagulation Profile: No results for input(s): INR, PROTIME in the last 168 hours. Cardiac Enzymes: No results for  input(s): CKTOTAL, CKMB, CKMBINDEX, TROPONINI in the last 168 hours. BNP (last 3 results) Recent Labs    04/14/18 1517  PROBNP 982*   HbA1C: Recent Labs    11/20/18 0630  HGBA1C 5.1   CBG: No results for input(s): GLUCAP in the last 168 hours. Lipid Profile: Recent Labs    11/20/18 0651  CHOL 192  HDL 76  LDLCALC 105*  TRIG 53  CHOLHDL 2.5   Thyroid Function Tests: Recent Labs    11/20/18 0630  TSH 2.290   Anemia Panel: No results for input(s): VITAMINB12, FOLATE, FERRITIN, TIBC, IRON, RETICCTPCT in the last 72 hours. Urine analysis:    Component Value Date/Time   COLORURINE YELLOW 01/30/2017 1336   APPEARANCEUR CLEAR 01/30/2017 1336   LABSPEC 1.012 01/30/2017 1336   PHURINE 5.0 01/30/2017 1336   GLUCOSEU NEGATIVE 01/30/2017 1336   HGBUR NEGATIVE 01/30/2017 1336   BILIRUBINUR NEGATIVE 01/30/2017 1336   KETONESUR NEGATIVE 01/30/2017 1336   PROTEINUR NEGATIVE 01/30/2017 1336   UROBILINOGEN 0.2 03/15/2009 1800   NITRITE NEGATIVE 01/30/2017 1336   LEUKOCYTESUR SMALL (A) 01/30/2017 1336   Sepsis Labs: @LABRCNTIP (procalcitonin:4,lacticidven:4)  ) Recent Results (from the past 240 hour(s))  SARS CORONAVIRUS 2 (TAT 6-24 HRS) Nasopharyngeal Nasopharyngeal Swab     Status: None   Collection Time: 11/19/18  5:40 PM   Specimen: Nasopharyngeal Swab  Result Value Ref Range Status   SARS Coronavirus 2 NEGATIVE NEGATIVE Final    Comment: (NOTE) SARS-CoV-2 target nucleic acids are NOT DETECTED. The SARS-CoV-2 RNA is generally detectable in upper and lower respiratory specimens during the acute phase of infection. Negative results do not preclude SARS-CoV-2 infection, do not rule out co-infections with other pathogens, and should not be used as the sole basis for treatment or other patient management decisions. Negative results must be combined with clinical observations, patient history, and epidemiological information. The expected result is Negative. Fact Sheet  for Patients: HairSlick.no Fact Sheet for Healthcare Providers: quierodirigir.com This test is not yet approved or cleared by the [place] FDA and  has been authorized for detection and/or diagnosis of SARS-CoV-2 by FDA under an Emergency Use Authorization (EUA). This EUA will remain  in effect (meaning this test can be used) for the duration of the COVID-19 declaration under Section 56 4(b)(1) of the Act, 21 U.S.C. section 360bbb-3(b)(1), unless the authorization is terminated or revoked sooner. Performed at [facility], [address], [place], [place] [address]          Radiology Studies: Ct Angio Chest Pe W Or Wo Contrast  Result Date: 11/19/2018 CLINICAL DATA:  Shortness of breath for 1 year EXAM: CT ANGIOGRAPHY CHEST WITH CONTRAST TECHNIQUE: Multidetector CT imaging of the chest was performed using the standard protocol during bolus administration of intravenous contrast. Multiplanar CT image reconstructions and MIPs were obtained to evaluate the vascular anatomy. CONTRAST:  OMNIPAQUE IOHEXOL 350 MG/ML SOLN COMPARISON:  CT chest February 17, 2017 FINDINGS: Cardiovascular: There is a optimal opacification of the pulmonary arteries. There is no central,segmental, or subsegmental filling defects within the pulmonary arteries.  There is mild cardiomegaly. No evidence of right ventricular heart strain. There is mitral and aortic valve calcifications. Aortic atherosclerotic calcifications are seen as on the prior exam. Calcifications seen at the origin of the great vessels. There is normal three-vessel brachiocephalic anatomy without proximal stenosis. There is an ectatic ascending thoracic aorta as on prior. Mediastinum/Nodes: No hilar, mediastinal, or axillary adenopathy. Thyroid gland, trachea, and esophagus demonstrate no significant findings. Lungs/Pleura: There is a mild amount of bibasilar dependent atelectasis. No  large airspace consolidation or pleural effusion. Upper Abdomen: No acute abnormalities present in the visualized portions of the upper abdomen. Musculoskeletal: No chest wall abnormality. There is worsening compression deformity of the T6 vertebral body with 80% loss of vertebral body height, progressed from the prior radiograph of September 18, 2017. There is unchanged slight superior compression deformities of T11 and T12 with approximately 25% loss in height. Review of the MIP images confirms the above findings. IMPRESSION: 1. No central, segmental, or subsegmental pulmonary embolism. 2. Mild cardiomegaly 3.  Aortic Atherosclerosis (ICD10-I70.0). 4. Worsening in compression deformity of the T6 vertebral body with 80% loss of vertebral body height compared to prior radiograph of September 18, 2017. Electronically Signed   By: Jonna ClarkBindu  Avutu M.D.   On: 11/19/2018 20:49   Mr Brain Wo Contrast  Result Date: 11/20/2018 CLINICAL DATA:  Initial evaluation for focal neural deficit, stroke suspected. EXAM: MRI HEAD WITHOUT CONTRAST TECHNIQUE: Multiplanar, multiecho pulse sequences of the brain and surrounding structures were obtained without intravenous contrast. COMPARISON:  Prior MRI from 01/30/2017. FINDINGS: Brain: Mild diffuse prominence of the CSF containing spaces compatible with generalized age-related cerebral atrophy. Patchy and confluent T2/FLAIR hyperintensity within the periventricular deep white matter both cerebral hemispheres most consistent with chronic small vessel ischemic disease, moderate nature. Superimposed small remote cortical infarct present at the right parietal lobe. Few small remote bilateral cerebellar infarcts. Tiny remote lacunar infarcts noted within the thalami. No abnormal foci of restricted diffusion to suggest acute or subacute ischemia. Gray-white matter differentiation maintained. No other areas of remote cortical infarction. No foci of susceptibility artifact to suggest acute or chronic  intracranial hemorrhage. No mass lesion, midline shift or mass effect. No hydrocephalus. No extra-axial fluid collection. Pituitary gland suprasellar region normal. Midline structures intact. Vascular: Major intracranial vascular flow voids maintained. Skull and upper cervical spine: Craniocervical junction within normal limits. Prominent degenerative spondylolysis noted at C5-6 without significant stenosis. Bone marrow signal intensity normal. No scalp soft tissue abnormality. Sinuses/Orbits: Patient status post ocular lens replacement on the right. Globes and orbital soft tissues demonstrate no acute finding. Paranasal sinuses are clear. No mastoid effusion. Inner ear structures normal. Other: None. IMPRESSION: 1. No acute intracranial abnormality. 2. Age-related cerebral atrophy with moderate chronic microvascular ischemic disease, with superimposed remote infarcts involving the right parietal lobe, bilateral thalami, and cerebellum. Electronically Signed   By: Rise MuBenjamin  McClintock M.D.   On: 11/20/2018 03:26   Mr Cervical Spine Wo Contrast  Result Date: 11/20/2018 CLINICAL DATA:  Initial evaluation for radiculopathy. Left arm numbness. EXAM: MRI CERVICAL SPINE WITHOUT CONTRAST TECHNIQUE: Multiplanar, multisequence MR imaging of the cervical spine was performed. No intravenous contrast was administered. COMPARISON:  Prior radiograph from 08/19/2017 FINDINGS: Alignment: Mild exaggeration of the normal cervical lordosis. Trace grade 1 anterolisthesis of C6 on C7, C7 on T1 and T1 on T2, chronic and facet mediated. Vertebrae: Vertebral body height maintained without evidence for acute or chronic fracture. Bone marrow signal intensity within normal limits. No discrete or worrisome osseous  lesions. Prominent reactive endplate changes noted about the C5-6 interspace. No other abnormal marrow edema. Cord: Signal intensity within the cervical spinal cord is normal. Posterior Fossa, vertebral arteries, paraspinal  tissues: Visualized brain and posterior fossa within normal limits. Craniocervical junction normal. Paraspinous and prevertebral soft tissues within normal limits. Normal intravascular flow voids seen within the vertebral arteries bilaterally. Disc levels: C2-C3: Chronic intervertebral disc space narrowing without significant disc bulge. No significant canal or foraminal stenosis. C3-C4: Chronic intervertebral disc space narrowing. Left paracentral disc osteophyte indents the ventral thecal sac, contacting the left ventral cord. Mild spinal stenosis without cord deformity. Left C3-4 facet is ankylosed. No significant foraminal narrowing. C4-C5: Chronic intervertebral disc space narrowing with diffuse degenerative disc osteophyte, asymmetric to the left. Flattening and partial effacement of the ventral thecal sac with resultant mild spinal stenosis. No cord deformity. Left greater than right facet hypertrophy. Moderate left worse than right C5 foraminal narrowing. C5-C6: Chronic intervertebral disc space narrowing with diffuse degenerative disc osteophyte. Associated prominent reactive endplate changes. Associated broad posterior component partially effaces the ventral thecal sac, contacting the ventral spinal cord. Mild spinal stenosis without cord deformity. Moderate bilateral C6 foraminal narrowing. C6-C7: Trace anterolisthesis. Chronic intervertebral disc space narrowing with mild disc bulge. Right worse than left facet hypertrophy. No significant spinal stenosis. Foramina remain patent. C7-T1: Mild anterolisthesis. Associated broad posterior pseudo disc bulge/uncovering. Bilateral facet hypertrophy. No canal or foraminal stenosis. Visualized upper thoracic spine demonstrates no significant finding. IMPRESSION: 1. Left paracentral disc osteophyte at C3-4, contacting the left ventral spinal cord and resulting in mild spinal stenosis. The ventral left C4 nerve root could be affected. 2. Degenerative disc osteophyte  with facet degeneration at C4-5 and C5-6 with resultant mild canal with moderate bilateral C5 and C6 foraminal stenosis, worse on the left. Either of the C5 or C6 nerve roots could be affected. 3. Prominent discogenic reactive endplate changes about the C5-6 interspace, which could contribute to underlying neck pain. Electronically Signed   By: Jeannine Boga M.D.   On: 11/20/2018 04:55   Dg Chest Port 1 View  Result Date: 11/19/2018 CLINICAL DATA:  Pt complains of SOB x54yr but states that the reason she came in what because her family "made her" because of her left arm numbness 2-3 days ago EXAM: PORTABLE CHEST 1 VIEW COMPARISON:  02/17/2017 FINDINGS: Heart size is normal. Stable mild elevation of the RIGHT hemidiaphragm. The lungs are free of focal consolidations and pleural effusions. No pulmonary edema. Visualized osseous structures have a normal appearance. IMPRESSION: No active disease. Electronically Signed   By: Nolon Nations M.D.   On: 11/19/2018 15:45        Scheduled Meds:  amLODipine  2.5 mg Oral Daily   aspirin  81 mg Oral Daily   clopidogrel  75 mg Oral Daily   famotidine  20 mg Oral Daily   ferrous sulfate  325 mg Oral Q breakfast   isosorbide mononitrate  60 mg Oral Daily   loratadine  10 mg Oral Daily   polyvinyl alcohol  1 drop Both Eyes BID   regadenoson       regadenoson  0.4 mg Intravenous Once   vitamin C  250 mg Oral Daily   Continuous Infusions:  heparin 600 Units/hr (11/20/18 0515)     LOS: 0 days    Time spent: 47min    Domenic Polite, MD [facility]  11/20/2018, 11:51 AM

## 2018-11-20 NOTE — Progress Notes (Signed)
Pt left floor to mri test via bed

## 2018-11-20 NOTE — Progress Notes (Signed)
PT Cancellation Note  Patient Details Name: [name] MRN: [id number] DOB: Mar 23, 1935   Cancelled Treatment:    Reason Eval/Treat Not Completed: Patient at procedure or test/unavailable   Jaydy Fitzhenry B Ramani Riva 11/20/2018, 10:41 AM  Elwyn Reach, PT Acute Rehabilitation Services Pager: [phone] Office: [phone]

## 2018-11-20 NOTE — Plan of Care (Signed)

## 2018-11-20 NOTE — Progress Notes (Signed)
Reviewed myovue EF 39% Moderate area of severe ischemia in inferior /infeiror lateral walls With pain elevated troponin delta x 3 and known CAD recommended Cath on Tuesday She is agreeable Continue heparin ECG is non acute  Jenkins Rouge

## 2018-11-20 NOTE — Progress Notes (Signed)
Notified provider ablut lab troponin sensitivity 197, pt is asymtomatic, will continue monitor, and follow up for new order.

## 2018-11-20 NOTE — Progress Notes (Signed)
ANTICOAGULATION CONSULT NOTE - Follow-Up Consult  Pharmacy Consult for heparin Indication: chest pain/ACS  Allergies  Allergen Reactions  . Ezetimibe Other (See Comments)    Muscle pain= zetia  . Ezetimibe-Simvastatin Other (See Comments)    Muscle pain= vytorin  . Morphine Other (See Comments)    nightmares  . Rosuvastatin Other (See Comments)    Muscle pain  . Ambien [Zolpidem Tartrate] Other (See Comments)    Fell down  . Plaquenil [Hydroxychloroquine Sulfate] Other (See Comments)    Doesn't recall reaction  . Polytrim [Polymyxin B-Trimethoprim] Other (See Comments)    unknown  . Prevacid [Lansoprazole] Other (See Comments)    dizziness    Patient Measurements: Height: 5\' 2"  (157.5 cm) Weight: 110 lb 9.6 oz (50.2 kg) IBW/kg (Calculated) : 50.1 Heparin Dosing Weight: 49.9 kg  Vital Signs: Temp: 98.5 F (36.9 C) (09/05 0805) Temp Source: Oral (09/05 0805) BP: 116/64 (09/05 1500) Pulse Rate: 43 (09/05 1500)  Labs: Recent Labs    11/19/18 1528  11/19/18 2355 11/20/18 0651 11/20/18 0957 11/20/18 1649  HGB 11.2*  --   --   --   --   --   HCT 35.3*  --   --   --   --   --   PLT 202  --   --   --   --   --   HEPARINUNFRC  --   --   --   --   --  0.24*  CREATININE 1.09*  --   --  1.09*  --   --   TROPONINIHS 80*   < > 197* 246* 237*  --    < > = values in this interval not displayed.    Estimated Creatinine Clearance: 32.6 mL/min (A) (by C-G formula based on SCr of 1.09 mg/dL (H)).   Medical History: Past Medical History:  Diagnosis Date  . Abnormal nuclear stress test 05/24/2015  . Atypical ductal hyperplasia of right breast 02/22/2014  . C. difficile colitis - recurrent 06/23/2016  . C. difficile diarrhea on po vanc 10/04/2015  . CAD (coronary artery disease) 2006, 2016   a. 08/2014 NSTEMI: Attempted PCI of 85% RPDA - unable to wire, complicated by dissection, case aborted;  b. 08/2014 Relook Cath: LM nl, LAD 60p, SP1 90ost (small), D1 65ost, RI 45/50, RCA nl,  RPDA 90 - healed dissection;  b. 08/2014 Echo: EF 55%, mild/mon midapical inferior HK, mild AI, PASP . c. Cath 05/2015 with CTO of the right-PDA and unsuccessful PCI  . CAD S/P unsuccessful PCI 08/18/14 04/20/2007  . chronic back   . CKD (chronic kidney disease), stage II   . COLONIC POLYPS, ADENOMATOUS, HX OF   . Diverticulitis   . Dyslipidemia    a. statin intolerant.  [name] Dyspnea    with exertion  . Elevated troponin level 10/04/2015  . ENDOMETRIOSIS   . Essential hypertension   . Fibromyalgia   . GASTRIC ANTRAL VASCULAR ECTASIA   . GERD   . History of hiatal hernia   . Hypertension, uncontrolled 10/04/2015  . Insomnia   . IRON DEFICIENCY ANEMIA SECONDARY TO BLOOD LOSS   . Myocardial infarction (HCC) 08/2014  . Neuropathy   . NSTEMI (non-ST elevated myocardial infarction) (HCC) 08/18/2014  . Osteoarthritis    "back, hips, basically all over" (05/24/2015)  . Palpitations 03/06/2011  . Paresthesia 08/01/2014  . Raynaud's syndrome   . Rosacea, acne   . Sicca syndrome (HCC)   . SJOGREN'S SYNDROME   .  SUPRAVENTRICULAR TACHYCARDIA   . Vitamin D deficiency     Medications:  Medications Prior to Admission  Medication Sig Dispense Refill Last Dose  . acetaminophen (TYLENOL) 500 MG tablet Take 1,000 mg by mouth 3 (three) times daily.   11/19/2018 at Unknown time  . amLODipine (NORVASC) 2.5 MG tablet Take 2.5 mg by mouth daily.   11/19/2018 at Unknown time  . aspirin 81 MG tablet Take 81 mg by mouth daily.   11/19/2018 at 0830  . clopidogrel (PLAVIX) 75 MG tablet TAKE 1 TABLET BY MOUTH ONCE DAILY WITH BREAKFAST (Patient taking differently: Take 75 mg by mouth daily. ) 90 tablet 3 11/19/2018 at 0830  . famotidine (PEPCID) 20 MG tablet Take 1 tablet (20 mg total) by mouth daily. 30 tablet 1 11/18/2018 at Unknown time  . feeding supplement, ENSURE ENLIVE, (ENSURE ENLIVE) LIQD Take 237 mLs by mouth 2 (two) times daily between meals.   unknown  . ferrous sulfate 325 (65 FE) MG tablet Take 325 mg by mouth  as needed. Stopped 11/08/16   11/19/2018 at Unknown time  . fluticasone (FLONASE ALLERGY RELIEF) 50 MCG/ACT nasal spray Place 1 spray into the nose daily as needed for allergies.   unknown  . furosemide (LASIX) 20 MG tablet Take 1 tablet (20 mg total) by mouth daily. 90 tablet 3 unknown  . isosorbide mononitrate (IMDUR) 30 MG 24 hr tablet Take 1 tablet by mouth once daily 90 tablet 3 11/19/2018 at Unknown time  . loratadine (CLARITIN) 10 MG tablet Take 10 mg by mouth daily.   unknown  . Multiple Vitamins-Minerals (EMERGEN-C VITAMIN C PO) Take 1 tablet by mouth daily.   11/19/2018 at Unknown time  . nitroGLYCERIN (NITROSTAT) 0.4 MG SL tablet Place 1 tablet (0.4 mg total) under the tongue every 5 (five) minutes as needed for chest pain. 25 tablet 1 11/19/2018 at Unknown time  . Polyvinyl Alcohol-Povidone (REFRESH OP) Place 1 drop into both eyes 2 (two) times daily.    11/19/2018 at Unknown time    Assessment: 59 yoF on heparin for ACS r/o. Initial heparin level slightly below goal at 0.24. Cath planned for Tuesday.  Goal of Therapy:  Heparin level 0.3-0.7 units/ml Monitor platelets by anticoagulation protocol: Yes   Plan:  -Increase heparin to 700 units/hr -Daily heparin level and CBC   Arrie Senate, PharmD, BCPS Clinical Pharmacist [phone] Please check AMION for all [place] numbers 11/20/2018

## 2018-11-21 ENCOUNTER — Inpatient Hospital Stay (HOSPITAL_COMMUNITY): Payer: Medicare Other

## 2018-11-21 DIAGNOSIS — I82402 Acute embolism and thrombosis of unspecified deep veins of left lower extremity: Secondary | ICD-10-CM

## 2018-11-21 DIAGNOSIS — R609 Edema, unspecified: Secondary | ICD-10-CM

## 2018-11-21 DIAGNOSIS — R7989 Other specified abnormal findings of blood chemistry: Secondary | ICD-10-CM

## 2018-11-21 DIAGNOSIS — I5032 Chronic diastolic (congestive) heart failure: Secondary | ICD-10-CM

## 2018-11-21 DIAGNOSIS — R200 Anesthesia of skin: Secondary | ICD-10-CM

## 2018-11-21 LAB — BASIC METABOLIC PANEL
Anion gap: 11 (ref 5–15)
BUN: 11 mg/dL (ref 8–23)
CO2: 21 mmol/L — ABNORMAL LOW (ref 22–32)
Calcium: 8.6 mg/dL — ABNORMAL LOW (ref 8.9–10.3)
Chloride: 100 mmol/L (ref 98–111)
Creatinine, Ser: 1.01 mg/dL — ABNORMAL HIGH (ref 0.44–1.00)
GFR calc Af Amer: >60 mL/min (ref >60)
GFR calc non Af Amer: 53 mL/min — ABNORMAL LOW (ref >60)
Glucose, Bld: 80 mg/dL (ref 70–99)
Potassium: 3.6 mmol/L (ref 3.5–5.1)
Sodium: 132 mmol/L — ABNORMAL LOW (ref 135–145)

## 2018-11-21 LAB — CBC
HCT: 30.7 % — ABNORMAL LOW (ref 36.0–46.0)
Hemoglobin: 10.1 g/dL — ABNORMAL LOW (ref 12.0–15.0)
MCH: 27.6 pg (ref 26.0–34.0)
MCHC: 32.9 g/dL (ref 30.0–36.0)
MCV: 83.9 fL (ref 80.0–100.0)
Platelets: 207 10*3/uL (ref 150–400)
RBC: 3.66 MIL/uL — ABNORMAL LOW (ref 3.87–5.11)
RDW: 15.1 % (ref 11.5–15.5)
WBC: 5.1 10*3/uL (ref 4.0–10.5)
nRBC: 0 % (ref 0.0–0.2)

## 2018-11-21 LAB — HEPARIN LEVEL (UNFRACTIONATED)
Heparin Unfractionated: 0.42 IU/mL (ref 0.30–0.70)
Heparin Unfractionated: 0.52 IU/mL (ref 0.30–0.70)

## 2018-11-21 NOTE — Progress Notes (Signed)
Subjective:  Denies SSCP, palpitations or Dyspnea Lips chapped   Objective:  Vitals:   11/20/18 2027 11/21/18 0017 11/21/18 0625 11/21/18 0905  BP: 116/66 (!) 104/58 (!) 106/53 128/66  Pulse: 74 67 64 68  Resp:  14 14 20   Temp: 98 F (36.7 C) 98.4 F (36.9 C) 98.6 F (37 C) 98.3 F (36.8 C)  TempSrc: Oral Oral Oral Oral  SpO2: 98% 97% 98% 99%  Weight:   50.8 kg   Height:        Intake/Output from previous day:  Intake/Output Summary (Last 24 hours) at 11/21/2018 [id number] Last data filed at 11/21/2018 0857 Gross per 24 hour  Intake 615.04 ml  Output 1250 ml  Net -634.96 ml    Physical Exam:  Affect appropriate Healthy:  appears stated age HEENT: normal Neck supple with no adenopathy JVP normal no bruits no thyromegaly Lungs clear with no wheezing and good diaphragmatic motion Heart:  S1/S2 no murmur, no rub, gallop or click PMI normal Abdomen: benighn, BS positve, no tenderness, no AAA no bruit.  No HSM or HJR Distal pulses intact with no bruits No edema Neuro non-focal Skin warm and dry No muscular weakness   Lab Results: Basic Metabolic Panel: Recent Labs    11/20/18 0651 11/21/18 0247  NA 132* 132*  K 4.7 3.6  CL 101 100  CO2 19* 21*  GLUCOSE 77 80  BUN 11 11  CREATININE 1.09* 1.01*  CALCIUM 9.2 8.6*   Liver Function Tests: No results for input(s): AST, ALT, ALKPHOS, BILITOT, PROT, ALBUMIN in the last 72 hours. No results for input(s): LIPASE, AMYLASE in the last 72 hours. CBC: Recent Labs    11/19/18 1528 11/21/18 0247  WBC 5.5 5.1  HGB 11.2* 10.1*  HCT 35.3* 30.7*  MCV 88.0 83.9  PLT 202 207   Cardiac Enzymes: No results for input(s): CKTOTAL, CKMB, CKMBINDEX, TROPONINI in the last 72 hours. BNP: Invalid input(s): POCBNP D-Dimer: Recent Labs    11/19/18 1528  DDIMER 0.57*   Hemoglobin A1C: Recent Labs    11/20/18 0630  HGBA1C 5.1   Fasting Lipid Panel: Recent Labs    11/20/18 0651  CHOL 192  HDL 76  LDLCALC 105*   TRIG 53  CHOLHDL 2.5   Thyroid Function Tests: Recent Labs    11/20/18 0630  TSH 2.290    Imaging: Ct Angio Chest Pe W Or Wo Contrast  Result Date: 11/19/2018 CLINICAL DATA:  Shortness of breath for 1 year EXAM: CT ANGIOGRAPHY CHEST WITH CONTRAST TECHNIQUE: Multidetector CT imaging of the chest was performed using the standard protocol during bolus administration of intravenous contrast. Multiplanar CT image reconstructions and MIPs were obtained to evaluate the vascular anatomy. CONTRAST:  100mL OMNIPAQUE IOHEXOL 350 MG/ML SOLN COMPARISON:  CT chest February 17, 2017 FINDINGS: Cardiovascular: There is a optimal opacification of the pulmonary arteries. There is no central,segmental, or subsegmental filling defects within the pulmonary arteries. There is mild cardiomegaly. No evidence of right ventricular heart strain. There is mitral and aortic valve calcifications. Aortic atherosclerotic calcifications are seen as on the prior exam. Calcifications seen at the origin of the great vessels. There is normal three-vessel brachiocephalic anatomy without proximal stenosis. There is an ectatic ascending thoracic aorta as on prior. Mediastinum/Nodes: No hilar, mediastinal, or axillary adenopathy. Thyroid gland, trachea, and esophagus demonstrate no significant findings. Lungs/Pleura: There is a mild amount of bibasilar dependent atelectasis. No large airspace consolidation or pleural effusion. Upper Abdomen: No acute abnormalities present  in the visualized portions of the upper abdomen. Musculoskeletal: No chest wall abnormality. There is worsening compression deformity of the T6 vertebral body with 80% loss of vertebral body height, progressed from the prior radiograph of September 18, 2017. There is unchanged slight superior compression deformities of T11 and T12 with approximately 25% loss in height. Review of the MIP images confirms the above findings. IMPRESSION: 1. No central, segmental, or subsegmental  pulmonary embolism. 2. Mild cardiomegaly 3.  Aortic Atherosclerosis (ICD10-I70.0). 4. Worsening in compression deformity of the T6 vertebral body with 80% loss of vertebral body height compared to prior radiograph of September 18, 2017. Electronically Signed   By: Jonna ClarkBindu  Avutu M.D.   On: 11/19/2018 20:49   Mr Brain Wo Contrast  Result Date: 11/20/2018 CLINICAL DATA:  Initial evaluation for focal neural deficit, stroke suspected. EXAM: MRI HEAD WITHOUT CONTRAST TECHNIQUE: Multiplanar, multiecho pulse sequences of the brain and surrounding structures were obtained without intravenous contrast. COMPARISON:  Prior MRI from 01/30/2017. FINDINGS: Brain: Mild diffuse prominence of the CSF containing spaces compatible with generalized age-related cerebral atrophy. Patchy and confluent T2/FLAIR hyperintensity within the periventricular deep white matter both cerebral hemispheres most consistent with chronic small vessel ischemic disease, moderate nature. Superimposed small remote cortical infarct present at the right parietal lobe. Few small remote bilateral cerebellar infarcts. Tiny remote lacunar infarcts noted within the thalami. No abnormal foci of restricted diffusion to suggest acute or subacute ischemia. Gray-white matter differentiation maintained. No other areas of remote cortical infarction. No foci of susceptibility artifact to suggest acute or chronic intracranial hemorrhage. No mass lesion, midline shift or mass effect. No hydrocephalus. No extra-axial fluid collection. Pituitary gland suprasellar region normal. Midline structures intact. Vascular: Major intracranial vascular flow voids maintained. Skull and upper cervical spine: Craniocervical junction within normal limits. Prominent degenerative spondylolysis noted at C5-6 without significant stenosis. Bone marrow signal intensity normal. No scalp soft tissue abnormality. Sinuses/Orbits: Patient status post ocular lens replacement on the right. Globes and orbital  soft tissues demonstrate no acute finding. Paranasal sinuses are clear. No mastoid effusion. Inner ear structures normal. Other: None. IMPRESSION: 1. No acute intracranial abnormality. 2. Age-related cerebral atrophy with moderate chronic microvascular ischemic disease, with superimposed remote infarcts involving the right parietal lobe, bilateral thalami, and cerebellum. Electronically Signed   By: Rise MuBenjamin  McClintock M.D.   On: 11/20/2018 03:26   Mr Cervical Spine Wo Contrast  Result Date: 11/20/2018 CLINICAL DATA:  Initial evaluation for radiculopathy. Left arm numbness. EXAM: MRI CERVICAL SPINE WITHOUT CONTRAST TECHNIQUE: Multiplanar, multisequence MR imaging of the cervical spine was performed. No intravenous contrast was administered. COMPARISON:  Prior radiograph from 08/19/2017 FINDINGS: Alignment: Mild exaggeration of the normal cervical lordosis. Trace grade 1 anterolisthesis of C6 on C7, C7 on T1 and T1 on T2, chronic and facet mediated. Vertebrae: Vertebral body height maintained without evidence for acute or chronic fracture. Bone marrow signal intensity within normal limits. No discrete or worrisome osseous lesions. Prominent reactive endplate changes noted about the C5-6 interspace. No other abnormal marrow edema. Cord: Signal intensity within the cervical spinal cord is normal. Posterior Fossa, vertebral arteries, paraspinal tissues: Visualized brain and posterior fossa within normal limits. Craniocervical junction normal. Paraspinous and prevertebral soft tissues within normal limits. Normal intravascular flow voids seen within the vertebral arteries bilaterally. Disc levels: C2-C3: Chronic intervertebral disc space narrowing without significant disc bulge. No significant canal or foraminal stenosis. C3-C4: Chronic intervertebral disc space narrowing. Left paracentral disc osteophyte indents the ventral thecal sac, contacting the  left ventral cord. Mild spinal stenosis without cord deformity.  Left C3-4 facet is ankylosed. No significant foraminal narrowing. C4-C5: Chronic intervertebral disc space narrowing with diffuse degenerative disc osteophyte, asymmetric to the left. Flattening and partial effacement of the ventral thecal sac with resultant mild spinal stenosis. No cord deformity. Left greater than right facet hypertrophy. Moderate left worse than right C5 foraminal narrowing. C5-C6: Chronic intervertebral disc space narrowing with diffuse degenerative disc osteophyte. Associated prominent reactive endplate changes. Associated broad posterior component partially effaces the ventral thecal sac, contacting the ventral spinal cord. Mild spinal stenosis without cord deformity. Moderate bilateral C6 foraminal narrowing. C6-C7: Trace anterolisthesis. Chronic intervertebral disc space narrowing with mild disc bulge. Right worse than left facet hypertrophy. No significant spinal stenosis. Foramina remain patent. C7-T1: Mild anterolisthesis. Associated broad posterior pseudo disc bulge/uncovering. Bilateral facet hypertrophy. No canal or foraminal stenosis. Visualized upper thoracic spine demonstrates no significant finding. IMPRESSION: 1. Left paracentral disc osteophyte at C3-4, contacting the left ventral spinal cord and resulting in mild spinal stenosis. The ventral left C4 nerve root could be affected. 2. Degenerative disc osteophyte with facet degeneration at C4-5 and C5-6 with resultant mild canal with moderate bilateral C5 and C6 foraminal stenosis, worse on the left. Either of the C5 or C6 nerve roots could be affected. 3. Prominent discogenic reactive endplate changes about the C5-6 interspace, which could contribute to underlying neck pain. Electronically Signed   By: Rise Mu M.D.   On: 11/20/2018 04:55   Nm Myocar Multi W/spect W/wall Motion / Ef  Result Date: 11/20/2018  There was no ST segment deviation noted during stress.  Findings consistent with ischemia.  This is an  intermediate risk study.  The left ventricular ejection fraction is moderately decreased (30-44%).  Moderate area of severe ischemia in the inferior to inferior lateal wall at mid and basal level Inferior wall hypokinesis EF estimated at 39% Intermediate risk study base on size/severity of ischemia Area and depressed EF   Dg Chest Port 1 View  Result Date: 11/19/2018 CLINICAL DATA:  Pt complains of SOB x97yr but states that the reason she came in what because her family "made her" because of her left arm numbness 2-3 days ago EXAM: PORTABLE CHEST 1 VIEW COMPARISON:  02/17/2017 FINDINGS: Heart size is normal. Stable mild elevation of the RIGHT hemidiaphragm. The lungs are free of focal consolidations and pleural effusions. No pulmonary edema. Visualized osseous structures have a normal appearance. IMPRESSION: No active disease. Electronically Signed   By: Norva Pavlov M.D.   On: 11/19/2018 15:45    Cardiac Studies:  ECG: NSR no acute changes    Telemetry:  NSR 11/21/2018   Echo: 04/19/18 EF 55-60%   Medications:    aspirin  81 mg Oral Daily   [START ON 11/23/2018] aspirin  81 mg Oral Pre-Cath   clopidogrel  75 mg Oral Daily   famotidine  20 mg Oral Daily   ferrous sulfate  325 mg Oral Q breakfast   isosorbide mononitrate  60 mg Oral Daily   loratadine  10 mg Oral Daily   metoprolol tartrate  12.5 mg Oral BID   polyvinyl alcohol  1 drop Both Eyes BID   regadenoson  0.4 mg Intravenous Once   vitamin C  250 mg Oral Daily      [START ON 11/23/2018] sodium chloride     Followed by   [START ON 11/23/2018] sodium chloride     heparin 700 Units/hr (11/21/18 0541)  Assessment/Plan:   Chest Pain:  History of failed PCI/PDA 2016 with wire dissection Cath 2017 with moderate circumflex and LAD dx with occluded PDA Myovue 11/20/18 with moderate area of severe inferior /inferior lateral wall ischemia. Mildly elevated troponin On heparin For cath on Tuesday The current medical regimen is  effective;  continue present plan and medications. ASA/Plavix ( not CABG candidate) beta blocker and nitrates Orders done on schedule Prefers not to have Dr Ellyn Hack do as he performed cath with wire dissection   Jenkins Rouge 11/21/2018, 9:52 AM

## 2018-11-21 NOTE — Progress Notes (Signed)
VASCULAR LAB PRELIMINARY  PRELIMINARY  PRELIMINARY  PRELIMINARY  Bilateral lower extremity venous duplex completed.    Preliminary report:  See CV proc for preliminary results.  Gave report to [place], RN  Ovide Dusek, [place], RVT 11/21/2018, 12:35 PM

## 2018-11-21 NOTE — Evaluation (Signed)
Physical Therapy Evaluation Patient Details Name: [name] MRN: [id number] DOB: 1935/08/13 Today's Date: 11/21/2018   History of Present Illness  83 yo admitted with SOB, CP, and LUE with NSTEMI vs demand ischemia secondary to acute on chronic diastolic CHF. PMhx: CAD, CHF, anemia, HTN  Clinical Impression  Pt supine on arrival reporting no LUE pain but once mobilizing did endorse continued numbness. Hr 76 at rest with rise to 95 during gait. Pt with equal strength bil UE and LE with pt with mild balance deficits and decreased activity tolerance this session. Pt performed gait and stairs then reported increased numbness to include left leg but specifically calf when formally tested. RN aware of all above and assessing pt end of session. Pt will benefit from acute therapy to maximize mobility, function and balance.     Follow Up Recommendations No PT follow up    Equipment Recommendations  None recommended by PT    Recommendations for Other Services       Precautions / Restrictions Precautions Precautions: Fall      Mobility  Bed Mobility Overal bed mobility: Modified Independent                Transfers Overall transfer level: Modified independent                  Ambulation/Gait Ambulation/Gait assistance: Supervision Gait Distance (Feet): 300 Feet Assistive device: None Gait Pattern/deviations: Step-through pattern;Decreased stride length   Gait velocity interpretation: >2.62 ft/sec, indicative of community ambulatory General Gait Details: pt initiating gait without UE assist but reports feeling unsteady with need for guarding. Last 68' pt holding IV pole for increased reassurance with report of LLE numbness in addition to arm  Stairs Stairs: Yes Stairs assistance: Min guard Stair Management: Step to pattern;Forwards;One rail Right Number of Stairs: 3 General stair comments: pt able to perform stairs without physical assist with cues for safety due  to IV. Pt with report of left sided numbness after stairs with no other motor change and pt returned to room  Wheelchair Mobility    Modified Rankin (Stroke Patients Only)       Balance Overall balance assessment: Mild deficits observed, not formally tested                                           Pertinent Vitals/Pain Pain Assessment: No/denies pain    Home Living Family/patient expects to be discharged to:: Private residence Living Arrangements: Spouse/significant other Available Help at Discharge: Available 24 hours/day Type of Home: House       Home Layout: Two level;Bed/bath upstairs Home Equipment: Cane - single point      Prior Function Level of Independence: Independent               Hand Dominance        Extremity/Trunk Assessment   Upper Extremity Assessment Upper Extremity Assessment: LUE deficits/detail LUE Deficits / Details: pt reports generalized numbness/tingling of entire LUE not aggravated to alleviated with positioning or movement of arm or neck. Strength 5/5 elbow flexion and extension, 4/5 shoulder flexion and abduction    Lower Extremity Assessment Lower Extremity Assessment: (after stairs pt reporting numbness of LLE with further assessment located in calf only with strength 5/5 all myotomes)    Cervical / Trunk Assessment Cervical / Trunk Assessment: Other exceptions(forward head)  Communication   Communication: No  difficulties  Cognition Arousal/Alertness: Awake/alert Behavior During Therapy: WFL for tasks assessed/performed Overall Cognitive Status: Within Functional Limits for tasks assessed                                        General Comments General comments (skin integrity, edema, etc.): pt reports 2 falls in the last year    Exercises     Assessment/Plan    PT Assessment Patient needs continued PT services  PT Problem List Decreased activity tolerance;Decreased  balance;Decreased knowledge of use of DME;Impaired sensation;Decreased mobility       PT Treatment Interventions Gait training;Therapeutic exercise;Patient/family education;Stair training;Functional mobility training;Balance training;Therapeutic activities;DME instruction    PT Goals (Current goals can be found in the Care Plan section)  Acute Rehab PT Goals Patient Stated Goal: return home PT Goal Formulation: With patient Time For Goal Achievement: 12/05/18 Potential to Achieve Goals: Good    Frequency Min 3X/week   Barriers to discharge        Co-evaluation               AM-PAC PT "6 Clicks" Mobility  Outcome Measure Help needed turning from your back to your side while in a flat bed without using bedrails?: None Help needed moving from lying on your back to sitting on the side of a flat bed without using bedrails?: None Help needed moving to and from a bed to a chair (including a wheelchair)?: None Help needed standing up from a chair using your arms (e.g., wheelchair or bedside chair)?: None Help needed to walk in hospital room?: A Little Help needed climbing 3-5 steps with a railing? : A Little 6 Click Score: 22    End of Session Equipment Utilized During Treatment: Gait belt Activity Tolerance: Patient tolerated treatment well Patient left: in chair;with call bell/phone within reach;with nursing/sitter in room Nurse Communication: Mobility status(report of numbness progressing from LUE to also include LLE) PT Visit Diagnosis: Other abnormalities of gait and mobility (R26.89)    Time: 1020-1046 PT Time Calculation (min) (ACUTE ONLY): 26 min   Charges:   PT Evaluation $PT Eval Moderate Complexity: 1 Mod          [place], PT Acute Rehabilitation Services Pager: [phone] Office: [place] 11/21/2018, 12:15 PM

## 2018-11-21 NOTE — Progress Notes (Signed)
ANTICOAGULATION CONSULT NOTE - Follow-Up Consult  Pharmacy Consult for heparin Indication: chest pain/ACS (NSTEMI vs demand ischemia)  Allergies  Allergen Reactions  . Ezetimibe Other (See Comments)    Muscle pain= zetia  . Ezetimibe-Simvastatin Other (See Comments)    Muscle pain= vytorin  . Morphine Other (See Comments)    nightmares  . Rosuvastatin Other (See Comments)    Muscle pain  . Ambien [Zolpidem Tartrate] Other (See Comments)    Fell down  . Plaquenil [Hydroxychloroquine Sulfate] Other (See Comments)    Doesn't recall reaction  . Polytrim [Polymyxin B-Trimethoprim] Other (See Comments)    unknown  . Prevacid [Lansoprazole] Other (See Comments)    dizziness    Patient Measurements: Height: 5\' 2"  (157.5 cm) Weight: 112 lb 1.6 oz (50.8 kg)(scale c) IBW/kg (Calculated) : 50.1 Heparin Dosing Weight: 49.9 kg  Vital Signs: Temp: 98.3 F (36.8 C) (09/06 0905) Temp Source: Oral (09/06 0905) BP: 128/66 (09/06 0905) Pulse Rate: 68 (09/06 0905)  Labs: Recent Labs    11/19/18 1528  11/19/18 2355 11/20/18 0630 11/20/18 0957 11/20/18 1649 11/21/18 0247 11/21/18 0946  HGB 11.2*  --   --   --   --   --  10.1*  --   HCT 35.3*  --   --   --   --   --  30.7*  --   PLT 202  --   --   --   --   --  207  --   HEPARINUNFRC  --   --   --   --   --  0.24* 0.42 0.52  CREATININE 1.09*  --   --  1.09*  --   --  1.01*  --   TROPONINIHS 80*   < > 197* 246* 237*  --   --   --    < > = values in this interval not displayed.    Estimated Creatinine Clearance: 35.1 mL/min (A) (by C-G formula based on SCr of 1.01 mg/dL (H)).   Medical History: Past Medical History:  Diagnosis Date  . Abnormal nuclear stress test 05/24/2015  . Atypical ductal hyperplasia of right breast 02/22/2014  . C. difficile colitis - recurrent 06/23/2016  . C. difficile diarrhea on po vanc 10/04/2015  . CAD (coronary artery disease) 2006, 2016   a. 08/2014 NSTEMI: Attempted PCI of 85% RPDA - unable to wire,  complicated by dissection, case aborted;  b. 08/2014 Relook Cath: LM nl, LAD 60p, SP1 90ost (small), D1 65ost, RI 45/50, RCA nl, RPDA 90 - healed dissection;  b. 08/2014 Echo: EF 55%, mild/mon midapical inferior HK, mild AI, PASP 50mmHg. c. Cath 05/2015 with CTO of the right-PDA and unsuccessful PCI  . CAD S/P unsuccessful PCI 08/18/14 04/20/2007  . chronic back   . CKD (chronic kidney disease), stage II   . COLONIC POLYPS, ADENOMATOUS, HX OF   . Diverticulitis   . Dyslipidemia    a. statin intolerant.  [name] Dyspnea    with exertion  . Elevated troponin level 10/04/2015  . ENDOMETRIOSIS   . Essential hypertension   . Fibromyalgia   . GASTRIC ANTRAL VASCULAR ECTASIA   . GERD   . History of hiatal hernia   . Hypertension, uncontrolled 10/04/2015  . Insomnia   . IRON DEFICIENCY ANEMIA SECONDARY TO BLOOD LOSS   . Myocardial infarction ([place]) 08/2014  . Neuropathy   . NSTEMI (non-ST elevated myocardial infarction) ([place]) 08/18/2014  . Osteoarthritis    "back, hips, basically  all over" (05/24/2015)  . Palpitations 03/06/2011  . Paresthesia 08/01/2014  . Raynaud's syndrome   . Rosacea, acne   . Sicca syndrome ([facility])   . SJOGREN'S SYNDROME   . SUPRAVENTRICULAR TACHYCARDIA   . Vitamin D deficiency     Medications:  Medications Prior to Admission  Medication Sig Dispense Refill Last Dose  . acetaminophen (TYLENOL) 500 MG tablet Take 1,000 mg by mouth 3 (three) times daily.   11/19/2018 at Unknown time  . amLODipine (NORVASC) 2.5 MG tablet Take 2.5 mg by mouth daily.   11/19/2018 at Unknown time  . aspirin 81 MG tablet Take 81 mg by mouth daily.   11/19/2018 at 0830  . clopidogrel (PLAVIX) 75 MG tablet TAKE 1 TABLET BY MOUTH ONCE DAILY WITH BREAKFAST (Patient taking differently: Take 75 mg by mouth daily. ) 90 tablet 3 11/19/2018 at 0830  . famotidine (PEPCID) 20 MG tablet Take 1 tablet (20 mg total) by mouth daily. 30 tablet 1 11/18/2018 at Unknown time  . feeding supplement, ENSURE ENLIVE, (ENSURE ENLIVE) LIQD  Take 237 mLs by mouth 2 (two) times daily between meals.   unknown  . ferrous sulfate 325 (65 FE) MG tablet Take 325 mg by mouth as needed. Stopped 11/08/16   11/19/2018 at Unknown time  . fluticasone (FLONASE ALLERGY RELIEF) 50 MCG/ACT nasal spray Place 1 spray into the nose daily as needed for allergies.   unknown  . furosemide (LASIX) 20 MG tablet Take 1 tablet (20 mg total) by mouth daily. 90 tablet 3 unknown  . isosorbide mononitrate (IMDUR) 30 MG 24 hr tablet Take 1 tablet by mouth once daily 90 tablet 3 11/19/2018 at Unknown time  . loratadine (CLARITIN) 10 MG tablet Take 10 mg by mouth daily.   unknown  . Multiple Vitamins-Minerals (EMERGEN-C VITAMIN C PO) Take 1 tablet by mouth daily.   11/19/2018 at Unknown time  . nitroGLYCERIN (NITROSTAT) 0.4 MG SL tablet Place 1 tablet (0.4 mg total) under the tongue every 5 (five) minutes as needed for chest pain. 25 tablet 1 11/19/2018 at Unknown time  . Polyvinyl Alcohol-Povidone (REFRESH OP) Place 1 drop into both eyes 2 (two) times daily.    11/19/2018 at Unknown time    Assessment: 82 yoF on heparin for ACS r/o. Cath planned for Tuesday.  Confirmatory heparin level therapeutic at 0.52. No dose change needed at this time.   H&H low but stable, platelets WNL, no bleeding noted per nursing  Goal of Therapy:  Heparin level 0.3-0.7 units/ml Monitor platelets by anticoagulation protocol: Yes   Plan:  -Continue heparin at 700 units/hr -Monitor daily heparin level and CBC; watch for s/sx of bleeding   Domenic Moras, PharmD PGY1 Ambulatory Care Pharmacy Resident [employer] Phone: [phone] 11/21/2018

## 2018-11-21 NOTE — Progress Notes (Addendum)
PROGRESS NOTE    [name]  ZOX:[id number] DOB: 02/10/1936 DOA: 11/19/2018 PCP: Laurann MontanaWhite, Cynthia, MD    Brief Narrative:  83 year old female with history of coronary artery disease, presents to the hospital with complaints of shortness of breath and left arm numbness.  She has mildly elevated troponin.  She was seen by cardiology who performed a stress test which showed depressed EF of 39% and areas of ischemia.  Plans are for cardiac cath on 9/8.  She is anticoagulated with heparin.  For her left arm numbness, MRI imaging of brain did not show any evidence of stroke.  MRI C-spine did show degenerative changes and spinal stenosis.   Assessment & Plan:   Principal Problem:   SOB (shortness of breath) Active Problems:   Dyslipidemia-statin and Zetia intol   Iron deficiency anemia   CAD S/P unsuccessful PCI 08/18/14   GERD   Essential hypertension   CKD (chronic kidney disease), stage II   Elevated troponin   Hyponatremia   Hyperkalemia   Chronic diastolic (congestive) heart failure (HCC)   Left arm numbness   Leg DVT (deep venous thromboembolism), acute, left (HCC)   1. Elevated troponin.  NSTEMI versus demand ischemia.  She has known history of coronary artery disease.  Cardiology following.  She had Myoview on 9/5 that showed moderate area of severe inferior/inferolateral wall ischemia and decline in EF to 39% EF was normal on echo from 04/2018.  Plan is for cardiac cath on 9/8.  She is continued on aspirin and Plavix, beta-blockers and nitrates.  She is anticoagulated with intravenous heparin. 2. Acute left lower extremity DVT.  Noted on venous Dopplers.  Patient had elevated d-dimer on admission.  CT angiogram of the chest was negative for PE.  Continue on intravenous heparin. 3. Acute on chronic diastolic congestive heart failure.  She received a dose of intravenous Lasix yesterday.  Overall volume status appears to be improved.  She is not short of breath. 4. Left sided numbness.   Patient initially presented with left arm numbness, but feels that this has progressed to her entire left side.  Initial MRI of the brain did not show any evidence of stroke.  MRI of the C-spine showed cervical disc disease and spinal stenosis.  Consult with neurology. 5. Hyponatremia.  Asymptomatic.  Stable. 6. Chronic iron deficiency anemia.  Continue oral iron.   DVT prophylaxis: heparin infusion Code Status: full code Family Communication: Discussed with son, Dr. Abel Prestoom Alleman over the phone Disposition Plan: discharge home after cardiac catheterization on tuesday   Consultants:   Cardiology  Neurology  Procedures:     Antimicrobials:       Subjective: Complains of continued left sided numbness. Denies any weakness and is able to ambulate.   Objective: Vitals:   11/21/18 0625 11/21/18 0905 11/21/18 1053 11/21/18 1155  BP: (!) 106/53 128/66 124/65 113/72  Pulse: 64 68 63 (!) 58  Resp: 14 20  20   Temp: 98.6 F (37 C) 98.3 F (36.8 C) 99.3 F (37.4 C) 98.4 F (36.9 C)  TempSrc: Oral Oral Oral Oral  SpO2: 98% 99% 99% 100%  Weight: 50.8 kg     Height:        Intake/Output Summary (Last 24 hours) at 11/21/2018 1243 Last data filed at 11/21/2018 0900 Gross per 24 hour  Intake 735.04 ml  Output 800 ml  Net -64.96 ml   Filed Weights   11/20/18 0607 11/20/18 1630 11/21/18 0625  Weight: 52 kg 50.2 kg  50.8 kg    Examination:  General exam: Appears calm and comfortable  Respiratory system: Clear to auscultation. Respiratory effort normal. Cardiovascular system: S1 & S2 heard, RRR. No JVD, murmurs, rubs, gallops or clicks. No pedal edema. Gastrointestinal system: Abdomen is nondistended, soft and nontender. No organomegaly or masses felt. Normal bowel sounds heard. Central nervous system: Alert and oriented. Strength is equal bilaterally. Extremities: Symmetric 5 x 5 power. Skin: No rashes, lesions or ulcers Psychiatry: Judgement and insight appear normal. Mood &  affect appropriate.     Data Reviewed: I have personally reviewed following labs and imaging studies  CBC: Recent Labs  Lab 11/19/18 1528 11/21/18 0247  WBC 5.5 5.1  HGB 11.2* 10.1*  HCT 35.3* 30.7*  MCV 88.0 83.9  PLT 202 207   Basic Metabolic Panel: Recent Labs  Lab 11/19/18 1528 11/20/18 0651 11/21/18 0247  NA 129* 132* 132*  K 5.4* 4.7 3.6  CL 99 101 100  CO2 21* 19* 21*  GLUCOSE 92 77 80  BUN 11 11 11   CREATININE 1.09* 1.09* 1.01*  CALCIUM 9.6 9.2 8.6*   GFR: Estimated Creatinine Clearance: 35.1 mL/min (A) (by C-G formula based on SCr of 1.01 mg/dL (H)). Liver Function Tests: No results for input(s): AST, ALT, ALKPHOS, BILITOT, PROT, ALBUMIN in the last 168 hours. No results for input(s): LIPASE, AMYLASE in the last 168 hours. No results for input(s): AMMONIA in the last 168 hours. Coagulation Profile: No results for input(s): INR, PROTIME in the last 168 hours. Cardiac Enzymes: No results for input(s): CKTOTAL, CKMB, CKMBINDEX, TROPONINI in the last 168 hours. BNP (last 3 results) Recent Labs    04/14/18 1517  PROBNP 982*   HbA1C: Recent Labs    11/20/18 0630  HGBA1C 5.1   CBG: No results for input(s): GLUCAP in the last 168 hours. Lipid Profile: Recent Labs    11/20/18 0651  CHOL 192  HDL 76  LDLCALC 105*  TRIG 53  CHOLHDL 2.5   Thyroid Function Tests: Recent Labs    11/20/18 0630  TSH 2.290   Anemia Panel: No results for input(s): VITAMINB12, FOLATE, FERRITIN, TIBC, IRON, RETICCTPCT in the last 72 hours. Sepsis Labs: No results for input(s): PROCALCITON, LATICACIDVEN in the last 168 hours.  Recent Results (from the past 240 hour(s))  SARS CORONAVIRUS 2 (TAT 6-24 HRS) Nasopharyngeal Nasopharyngeal Swab     Status: None   Collection Time: 11/19/18  5:40 PM   Specimen: Nasopharyngeal Swab  Result Value Ref Range Status   SARS Coronavirus 2 NEGATIVE NEGATIVE Final    Comment: (NOTE) SARS-CoV-2 target nucleic acids are NOT  DETECTED. The SARS-CoV-2 RNA is generally detectable in upper and lower respiratory specimens during the acute phase of infection. Negative results do not preclude SARS-CoV-2 infection, do not rule out co-infections with other pathogens, and should not be used as the sole basis for treatment or other patient management decisions. Negative results must be combined with clinical observations, patient history, and epidemiological information. The expected result is Negative. Fact Sheet for Patients: HairSlick.nohttps://www.fda.gov/media/138098/download Fact Sheet for Healthcare Providers: quierodirigir.comhttps://www.fda.gov/media/138095/download This test is not yet approved or cleared by the [place] FDA and  has been authorized for detection and/or diagnosis of SARS-CoV-2 by FDA under an Emergency Use Authorization (EUA). This EUA will remain  in effect (meaning this test can be used) for the duration of the COVID-19 declaration under Section 56 4(b)(1) of the Act, 21 U.S.C. section 360bbb-3(b)(1), unless the authorization is terminated or revoked sooner. Performed  at [facility], [address] [place], [place] [address]          Radiology Studies: Ct Angio Chest Pe W Or Wo Contrast  Result Date: 11/19/2018 CLINICAL DATA:  Shortness of breath for 1 year EXAM: CT ANGIOGRAPHY CHEST WITH CONTRAST TECHNIQUE: Multidetector CT imaging of the chest was performed using the standard protocol during bolus administration of intravenous contrast. Multiplanar CT image reconstructions and MIPs were obtained to evaluate the vascular anatomy. CONTRAST:  100mL OMNIPAQUE IOHEXOL 350 MG/ML SOLN COMPARISON:  CT chest February 17, 2017 FINDINGS: Cardiovascular: There is a optimal opacification of the pulmonary arteries. There is no central,segmental, or subsegmental filling defects within the pulmonary arteries. There is mild cardiomegaly. No evidence of right ventricular heart strain. There is mitral and aortic valve  calcifications. Aortic atherosclerotic calcifications are seen as on the prior exam. Calcifications seen at the origin of the great vessels. There is normal three-vessel brachiocephalic anatomy without proximal stenosis. There is an ectatic ascending thoracic aorta as on prior. Mediastinum/Nodes: No hilar, mediastinal, or axillary adenopathy. Thyroid gland, trachea, and esophagus demonstrate no significant findings. Lungs/Pleura: There is a mild amount of bibasilar dependent atelectasis. No large airspace consolidation or pleural effusion. Upper Abdomen: No acute abnormalities present in the visualized portions of the upper abdomen. Musculoskeletal: No chest wall abnormality. There is worsening compression deformity of the T6 vertebral body with 80% loss of vertebral body height, progressed from the prior radiograph of September 18, 2017. There is unchanged slight superior compression deformities of T11 and T12 with approximately 25% loss in height. Review of the MIP images confirms the above findings. IMPRESSION: 1. No central, segmental, or subsegmental pulmonary embolism. 2. Mild cardiomegaly 3.  Aortic Atherosclerosis (ICD10-I70.0). 4. Worsening in compression deformity of the T6 vertebral body with 80% loss of vertebral body height compared to prior radiograph of September 18, 2017. Electronically Signed   By: Jonna ClarkBindu  Avutu M.D.   On: 11/19/2018 20:49   Mr Brain Wo Contrast  Result Date: 11/20/2018 CLINICAL DATA:  Initial evaluation for focal neural deficit, stroke suspected. EXAM: MRI HEAD WITHOUT CONTRAST TECHNIQUE: Multiplanar, multiecho pulse sequences of the brain and surrounding structures were obtained without intravenous contrast. COMPARISON:  Prior MRI from 01/30/2017. FINDINGS: Brain: Mild diffuse prominence of the CSF containing spaces compatible with generalized age-related cerebral atrophy. Patchy and confluent T2/FLAIR hyperintensity within the periventricular deep white matter both cerebral hemispheres  most consistent with chronic small vessel ischemic disease, moderate nature. Superimposed small remote cortical infarct present at the right parietal lobe. Few small remote bilateral cerebellar infarcts. Tiny remote lacunar infarcts noted within the thalami. No abnormal foci of restricted diffusion to suggest acute or subacute ischemia. Gray-white matter differentiation maintained. No other areas of remote cortical infarction. No foci of susceptibility artifact to suggest acute or chronic intracranial hemorrhage. No mass lesion, midline shift or mass effect. No hydrocephalus. No extra-axial fluid collection. Pituitary gland suprasellar region normal. Midline structures intact. Vascular: Major intracranial vascular flow voids maintained. Skull and upper cervical spine: Craniocervical junction within normal limits. Prominent degenerative spondylolysis noted at C5-6 without significant stenosis. Bone marrow signal intensity normal. No scalp soft tissue abnormality. Sinuses/Orbits: Patient status post ocular lens replacement on the right. Globes and orbital soft tissues demonstrate no acute finding. Paranasal sinuses are clear. No mastoid effusion. Inner ear structures normal. Other: None. IMPRESSION: 1. No acute intracranial abnormality. 2. Age-related cerebral atrophy with moderate chronic microvascular ischemic disease, with superimposed remote infarcts involving the right parietal  lobe, bilateral thalami, and cerebellum. Electronically Signed   By: Jeannine Boga M.D.   On: 11/20/2018 03:26   Mr Cervical Spine Wo Contrast  Result Date: 11/20/2018 CLINICAL DATA:  Initial evaluation for radiculopathy. Left arm numbness. EXAM: MRI CERVICAL SPINE WITHOUT CONTRAST TECHNIQUE: Multiplanar, multisequence MR imaging of the cervical spine was performed. No intravenous contrast was administered. COMPARISON:  Prior radiograph from 08/19/2017 FINDINGS: Alignment: Mild exaggeration of the normal cervical lordosis. Trace  grade 1 anterolisthesis of C6 on C7, C7 on T1 and T1 on T2, chronic and facet mediated. Vertebrae: Vertebral body height maintained without evidence for acute or chronic fracture. Bone marrow signal intensity within normal limits. No discrete or worrisome osseous lesions. Prominent reactive endplate changes noted about the C5-6 interspace. No other abnormal marrow edema. Cord: Signal intensity within the cervical spinal cord is normal. Posterior Fossa, vertebral arteries, paraspinal tissues: Visualized brain and posterior fossa within normal limits. Craniocervical junction normal. Paraspinous and prevertebral soft tissues within normal limits. Normal intravascular flow voids seen within the vertebral arteries bilaterally. Disc levels: C2-C3: Chronic intervertebral disc space narrowing without significant disc bulge. No significant canal or foraminal stenosis. C3-C4: Chronic intervertebral disc space narrowing. Left paracentral disc osteophyte indents the ventral thecal sac, contacting the left ventral cord. Mild spinal stenosis without cord deformity. Left C3-4 facet is ankylosed. No significant foraminal narrowing. C4-C5: Chronic intervertebral disc space narrowing with diffuse degenerative disc osteophyte, asymmetric to the left. Flattening and partial effacement of the ventral thecal sac with resultant mild spinal stenosis. No cord deformity. Left greater than right facet hypertrophy. Moderate left worse than right C5 foraminal narrowing. C5-C6: Chronic intervertebral disc space narrowing with diffuse degenerative disc osteophyte. Associated prominent reactive endplate changes. Associated broad posterior component partially effaces the ventral thecal sac, contacting the ventral spinal cord. Mild spinal stenosis without cord deformity. Moderate bilateral C6 foraminal narrowing. C6-C7: Trace anterolisthesis. Chronic intervertebral disc space narrowing with mild disc bulge. Right worse than left facet hypertrophy.  No significant spinal stenosis. Foramina remain patent. C7-T1: Mild anterolisthesis. Associated broad posterior pseudo disc bulge/uncovering. Bilateral facet hypertrophy. No canal or foraminal stenosis. Visualized upper thoracic spine demonstrates no significant finding. IMPRESSION: 1. Left paracentral disc osteophyte at C3-4, contacting the left ventral spinal cord and resulting in mild spinal stenosis. The ventral left C4 nerve root could be affected. 2. Degenerative disc osteophyte with facet degeneration at C4-5 and C5-6 with resultant mild canal with moderate bilateral C5 and C6 foraminal stenosis, worse on the left. Either of the C5 or C6 nerve roots could be affected. 3. Prominent discogenic reactive endplate changes about the C5-6 interspace, which could contribute to underlying neck pain. Electronically Signed   By: Jeannine Boga M.D.   On: 11/20/2018 04:55   Nm Myocar Multi W/spect W/wall Motion / Ef  Result Date: 11/20/2018  There was no ST segment deviation noted during stress.  Findings consistent with ischemia.  This is an intermediate risk study.  The left ventricular ejection fraction is moderately decreased (30-44%).  Moderate area of severe ischemia in the inferior to inferior lateal wall at mid and basal level Inferior wall hypokinesis EF estimated at 39% Intermediate risk study base on size/severity of ischemia Area and depressed EF   Dg Chest Port 1 View  Result Date: 11/19/2018 CLINICAL DATA:  Pt complains of SOB x99yr but states that the reason she came in what because her family "made her" because of her left arm numbness 2-3 days ago EXAM: PORTABLE CHEST 1  VIEW COMPARISON:  02/17/2017 FINDINGS: Heart size is normal. Stable mild elevation of the RIGHT hemidiaphragm. The lungs are free of focal consolidations and pleural effusions. No pulmonary edema. Visualized osseous structures have a normal appearance. IMPRESSION: No active disease. Electronically Signed   By: Norva Pavlov M.D.   On: 11/19/2018 15:45   Vas [place] Lower Extremity Venous (dvt)  Result Date: 11/21/2018  Lower Venous Study Indications: Elevated D-dimer, edema.  Comparison Study: Prior negative left LEV study done 08/22/15 is available for                   comparison Performing Technologist: Sherren Kerns RVS  Examination Guidelines: A complete evaluation includes B-mode imaging, spectral Doppler, color Doppler, and power Doppler as needed of all accessible portions of each vessel. Bilateral testing is considered an integral part of a complete examination. Limited examinations for reoccurring indications may be performed as noted.  +---------+---------------+---------+-----------+----------+--------------+  RIGHT     Compressibility Phasicity Spontaneity Properties Thrombus Aging  +---------+---------------+---------+-----------+----------+--------------+  CFV       Full            Yes       Yes                                    +---------+---------------+---------+-----------+----------+--------------+  SFJ       Full                                                             +---------+---------------+---------+-----------+----------+--------------+  FV Prox   Full                                                             +---------+---------------+---------+-----------+----------+--------------+  FV Mid    Full                                                             +---------+---------------+---------+-----------+----------+--------------+  FV Distal Full                                                             +---------+---------------+---------+-----------+----------+--------------+  PFV       Full                                                             +---------+---------------+---------+-----------+----------+--------------+  POP       Full            Yes  Yes                                    +---------+---------------+---------+-----------+----------+--------------+  PTV       Full                                                              +---------+---------------+---------+-----------+----------+--------------+  PERO      Full                                                             +---------+---------------+---------+-----------+----------+--------------+   +---------+---------------+---------+-----------+----------+--------------+  LEFT      Compressibility Phasicity Spontaneity Properties Thrombus Aging  +---------+---------------+---------+-----------+----------+--------------+  CFV       Full            Yes       Yes                                    +---------+---------------+---------+-----------+----------+--------------+  SFJ       Full                                                             +---------+---------------+---------+-----------+----------+--------------+  FV Prox   Full                                                             +---------+---------------+---------+-----------+----------+--------------+  FV Mid    Full                                                             +---------+---------------+---------+-----------+----------+--------------+  FV Distal Full                                                             +---------+---------------+---------+-----------+----------+--------------+  PFV       Full                                                             +---------+---------------+---------+-----------+----------+--------------+  POP       Full            Yes       Yes                                    +---------+---------------+---------+-----------+----------+--------------+  PTV       Full                                                             +---------+---------------+---------+-----------+----------+--------------+  PERO      None                                             Acute           +---------+---------------+---------+-----------+----------+--------------+     Summary: Right: There is no evidence of deep vein thrombosis in  the lower extremity. Left: Findings consistent with acute deep vein thrombosis involving the left peroneal veins.  *See table(s) above for measurements and observations.    Preliminary         Scheduled Meds:  aspirin  81 mg Oral Daily   [START ON 11/23/2018] aspirin  81 mg Oral Pre-Cath   clopidogrel  75 mg Oral Daily   famotidine  20 mg Oral Daily   ferrous sulfate  325 mg Oral Q breakfast   isosorbide mononitrate  60 mg Oral Daily   loratadine  10 mg Oral Daily   metoprolol tartrate  12.5 mg Oral BID   polyvinyl alcohol  1 drop Both Eyes BID   regadenoson  0.4 mg Intravenous Once   vitamin C  250 mg Oral Daily   Continuous Infusions:  [START ON 11/23/2018] sodium chloride     Followed by   Melene Muller ON 11/23/2018] sodium chloride     heparin 700 Units/hr (11/21/18 0541)     LOS: 1 day    Time spent:    Erick Blinks, MD [facility]   If 7PM-7AM, please contact night-coverage [email or web address]  11/21/2018, 12:43 PM

## 2018-11-21 NOTE — Plan of Care (Signed)

## 2018-11-21 NOTE — Progress Notes (Signed)
Patient experiencing left side numb, from neck down to leg from  after walking, Movement intact, with left eye blurry.

## 2018-11-21 NOTE — Progress Notes (Signed)
ANTICOAGULATION CONSULT NOTE - [place] for heparin Indication: chest pain/ACS  Allergies  Allergen Reactions  . Ezetimibe Other (See Comments)    Muscle pain= zetia  . Ezetimibe-Simvastatin Other (See Comments)    Muscle pain= vytorin  . Morphine Other (See Comments)    nightmares  . Rosuvastatin Other (See Comments)    Muscle pain  . Ambien [Zolpidem Tartrate] Other (See Comments)    Fell down  . Plaquenil [Hydroxychloroquine Sulfate] Other (See Comments)    Doesn't recall reaction  . Polytrim [Polymyxin B-Trimethoprim] Other (See Comments)    unknown  . Prevacid [Lansoprazole] Other (See Comments)    dizziness    Patient Measurements: Height: 5\' 2"  (157.5 cm) Weight: 112 lb 1.6 oz (50.8 kg)(scale c) IBW/kg (Calculated) : 50.1 Heparin Dosing Weight: 49.9 kg  Vital Signs: Temp: 98.3 F (36.8 C) (09/06 0905) Temp Source: Oral (09/06 0905) BP: 128/66 (09/06 0905) Pulse Rate: 68 (09/06 0905)  Labs: Recent Labs    11/19/18 1528  11/19/18 2355 11/20/18 1610 11/20/18 0957 11/20/18 1649 11/21/18 0247 11/21/18 0946  HGB 11.2*  --   --   --   --   --  10.1*  --   HCT 35.3*  --   --   --   --   --  30.7*  --   PLT 202  --   --   --   --   --  207  --   HEPARINUNFRC  --   --   --   --   --  0.24* 0.42 0.52  CREATININE 1.09*  --   --  1.09*  --   --  1.01*  --   TROPONINIHS 80*   < > 197* 246* 237*  --   --   --    < > = values in this interval not displayed.    Estimated Creatinine Clearance: 35.1 mL/min (A) (by C-G formula based on SCr of 1.01 mg/dL (H)).   Medical History: Past Medical History:  Diagnosis Date  . Abnormal nuclear stress test 05/24/2015  . Atypical ductal hyperplasia of right breast 02/22/2014  . C. difficile colitis - recurrent 06/23/2016  . C. difficile diarrhea on po vanc 10/04/2015  . CAD (coronary artery disease) 2006, 2016   a. 08/2014 NSTEMI: Attempted PCI of 85% RPDA - unable to wire, complicated by dissection,  case aborted;  b. 08/2014 Relook Cath: LM nl, LAD 60p, SP1 90ost (small), D1 65ost, RI 45/50, RCA nl, RPDA 90 - healed dissection;  b. 08/2014 Echo: EF 55%, mild/mon midapical inferior HK, mild AI, PASP 79mmHg. c. Cath 05/2015 with CTO of the right-PDA and unsuccessful PCI  . CAD S/P unsuccessful PCI 08/18/14 04/20/2007  . chronic back   . CKD (chronic kidney disease), stage II   . COLONIC POLYPS, ADENOMATOUS, HX OF   . Diverticulitis   . Dyslipidemia    a. statin intolerant.  [name] Dyspnea    with exertion  . Elevated troponin level 10/04/2015  . ENDOMETRIOSIS   . Essential hypertension   . Fibromyalgia   . GASTRIC ANTRAL VASCULAR ECTASIA   . GERD   . History of hiatal hernia   . Hypertension, uncontrolled 10/04/2015  . Insomnia   . IRON DEFICIENCY ANEMIA SECONDARY TO BLOOD LOSS   . Myocardial infarction ([place]) 08/2014  . Neuropathy   . NSTEMI (non-ST elevated myocardial infarction) ([place]) 08/18/2014  . Osteoarthritis    "back, hips, basically all over" (05/24/2015)  .  Palpitations 03/06/2011  . Paresthesia 08/01/2014  . Raynaud's syndrome   . Rosacea, acne   . Sicca syndrome ([facility])   . SJOGREN'S SYNDROME   . SUPRAVENTRICULAR TACHYCARDIA   . Vitamin D deficiency     Medications:  Medications Prior to Admission  Medication Sig Dispense Refill Last Dose  . acetaminophen (TYLENOL) 500 MG tablet Take 1,000 mg by mouth 3 (three) times daily.   11/19/2018 at Unknown time  . amLODipine (NORVASC) 2.5 MG tablet Take 2.5 mg by mouth daily.   11/19/2018 at Unknown time  . aspirin 81 MG tablet Take 81 mg by mouth daily.   11/19/2018 at 0830  . clopidogrel (PLAVIX) 75 MG tablet TAKE 1 TABLET BY MOUTH ONCE DAILY WITH BREAKFAST (Patient taking differently: Take 75 mg by mouth daily. ) 90 tablet 3 11/19/2018 at 0830  . famotidine (PEPCID) 20 MG tablet Take 1 tablet (20 mg total) by mouth daily. 30 tablet 1 11/18/2018 at Unknown time  . feeding supplement, ENSURE ENLIVE, (ENSURE ENLIVE) LIQD Take 237 mLs by mouth 2  (two) times daily between meals.   unknown  . ferrous sulfate 325 (65 FE) MG tablet Take 325 mg by mouth as needed. Stopped 11/08/16   11/19/2018 at Unknown time  . fluticasone (FLONASE ALLERGY RELIEF) 50 MCG/ACT nasal spray Place 1 spray into the nose daily as needed for allergies.   unknown  . furosemide (LASIX) 20 MG tablet Take 1 tablet (20 mg total) by mouth daily. 90 tablet 3 unknown  . isosorbide mononitrate (IMDUR) 30 MG 24 hr tablet Take 1 tablet by mouth once daily 90 tablet 3 11/19/2018 at Unknown time  . loratadine (CLARITIN) 10 MG tablet Take 10 mg by mouth daily.   unknown  . Multiple Vitamins-Minerals (EMERGEN-C VITAMIN C PO) Take 1 tablet by mouth daily.   11/19/2018 at Unknown time  . nitroGLYCERIN (NITROSTAT) 0.4 MG SL tablet Place 1 tablet (0.4 mg total) under the tongue every 5 (five) minutes as needed for chest pain. 25 tablet 1 11/19/2018 at Unknown time  . Polyvinyl Alcohol-Povidone (REFRESH OP) Place 1 drop into both eyes 2 (two) times daily.    11/19/2018 at Unknown time    Assessment: 82 yoF on heparin for ACS r/o.[name] Cath planned for Tuesday.  Heparin level today is therapeutic at 0.52. her H&H has trended down slightly and is now 10.1/30.7. plts are wnl.   Goal of Therapy:  Heparin level 0.3-0.7 units/ml Monitor platelets by anticoagulation protocol: Yes   Plan:  -Continue heparin at 700 units/hr -Daily heparin level and CBC - monitor for s/sx of bleeding    Thank you,   Fara Olden, PharmD PGY-1 Pharmacy Resident   Please check amion for clinical pharmacist contact number

## 2018-11-21 NOTE — Progress Notes (Signed)
ANTICOAGULATION CONSULT NOTE - Follow Up Consult  Pharmacy Consult for heparin Indication: NSTEMI vs demand ischemia  Labs: Recent Labs    11/19/18 1528  11/19/18 2355 11/20/18 0651 11/20/18 0957 11/20/18 1649 11/21/18 0247  HGB 11.2*  --   --   --   --   --  10.1*  HCT 35.3*  --   --   --   --   --  30.7*  PLT 202  --   --   --   --   --  207  HEPARINUNFRC  --   --   --   --   --  0.24* 0.42  CREATININE 1.09*  --   --  1.09*  --   --   --   TROPONINIHS 80*   < > 197* 246* 237*  --   --    < > = values in this interval not displayed.    Assessment/Plan:  83yo female therapeutic on heparin after rate change. Will continue gtt at current rate and confirm stable with additional level.   Wynona Neat, PharmD, BCPS  11/21/2018,3:48 AM

## 2018-11-21 NOTE — Consult Note (Addendum)
NEURO HOSPITALIST  CONSULT   Requesting Physician: Dr. Kerry HoughMemon    Chief Complaint: left arm numbness  History obtained from:  Patient     HPI:                                                                                                                                         [name] is an 83 y.o. female  PMH HTN, HLD, SVT, CHF 9 diastolic), CAD, CKD 2, right bundle branch blockage who [facility] and left arm numbness.   Per patient her left arm numbness began 9/4 and now her whole left side is numb. Denies any weakness, vision changes, HA or trouble walking.[name]. Describes left arm numbness as " feels like I had a light stroke". No prior stroke history.  MRI: no acute abnormality; remote infarcts involving right parietal lobe, bilateral thalami and cerebellum.   tPA Given: no outside of window       Modified Rankin: Rankin Score=1 NIHSS:0    Past Medical History:  Diagnosis Date  . Abnormal nuclear stress test 05/24/2015  . Atypical ductal hyperplasia of right breast 02/22/2014  . C. difficile colitis - recurrent 06/23/2016  . C. difficile diarrhea on po vanc 10/04/2015  . CAD (coronary artery disease) 2006, 2016   a. 08/2014 NSTEMI: Attempted PCI of 85% RPDA - unable to wire, complicated by dissection, case aborted;  b. 08/2014 Relook Cath: LM nl, LAD 60p, SP1 90ost (small), D1 65ost, RI 45/50, RCA nl, RPDA 90 - healed dissection;  b. 08/2014 Echo: EF 55%, mild/mon midapical inferior HK, mild AI, PASP 43mmHg. c. Cath 05/2015 with CTO of the right-PDA and unsuccessful PCI  . CAD S/P unsuccessful PCI 08/18/14 04/20/2007  . chronic back   . CKD (chronic kidney disease), stage II   . COLONIC POLYPS, ADENOMATOUS, HX OF   . Diverticulitis   . Dyslipidemia    a. statin intolerant.  [name]. Dyspnea    with exertion  . Elevated troponin level 10/04/2015  . ENDOMETRIOSIS   . Essential hypertension   . Fibromyalgia   . GASTRIC ANTRAL  VASCULAR ECTASIA   . GERD   . History of hiatal hernia   . Hypertension, uncontrolled 10/04/2015  . Insomnia   . IRON DEFICIENCY ANEMIA SECONDARY TO BLOOD LOSS   . Myocardial infarction (HCC) 08/2014  . Neuropathy   . NSTEMI (non-ST elevated myocardial infarction) (HCC) 08/18/2014  . Osteoarthritis    "back, hips, basically all over" (05/24/2015)  . Palpitations 03/06/2011  . Paresthesia 08/01/2014  . Raynaud's syndrome   . Rosacea, acne   . Sicca syndrome (  HCC)   . SJOGREN'S SYNDROME   . SUPRAVENTRICULAR TACHYCARDIA   . Vitamin D deficiency     Past Surgical History:  Procedure Laterality Date  . ABDOMINAL HYSTERECTOMY     left right ovary  . APPENDECTOMY    . BREAST EXCISIONAL BIOPSY Right   . BREAST EXCISIONAL BIOPSY Right   . BREAST LUMPECTOMY WITH RADIOACTIVE SEED LOCALIZATION Right 01/24/2014   Procedure: BREAST LUMPECTOMY WITH RADIOACTIVE SEED LOCALIZATION;  Surgeon: Avel Peaceodd Rosenbower, MD;  Location: [facility];  Service: General;  Laterality: Right;  . CARDIAC CATHETERIZATION N/A 08/18/2014   Procedure: Left Heart Cath and Coronary Angiography;  Surgeon: Marykay Lexavid W Harding, MD;  [facility];  Service: Cardiovascular;  Laterality: N/A;  . CARDIAC CATHETERIZATION  08/18/2014   Procedure: Coronary Balloon Angioplasty;  Surgeon: Marykay Lexavid W Harding, MD;  [facility];  Service: Cardiovascular;;  . CARDIAC CATHETERIZATION N/A 08/21/2014   Procedure: Left Heart Cath and Coronary Angiography;  Surgeon: Marykay Lexavid W Harding, MD;  [facility];  Service: Cardiovascular;  Laterality: N/A;  . CARDIAC CATHETERIZATION  2006  . CARDIAC CATHETERIZATION N/A 05/24/2015   Procedure: Left Heart Cath and Coronary Angiography;  Surgeon: Tonny BollmanMichael Cooper, MD;  [facility];  Service: Cardiovascular;  Laterality: N/A;  . COLONOSCOPY W/ BIOPSIES AND POLYPECTOMY  12/17/2006   adenomatous polyps, external hemorrhoids  . ESOPHAGOGASTRODUODENOSCOPY   11/27/2009   APC tretment of lesions  . EYE SURGERY    . HERNIA REPAIR    . HIATAL HERNIA REPAIR  2005   and paraesophageal  . LAPAROSCOPIC CHOLECYSTECTOMY    . TEAR DUCT PROBING Left    "no plugs or anything"    Family History  Problem Relation Age of Onset  . Heart failure Mother   . Diabetes Mother   . Kidney failure Mother   . Hypertension Mother   . Coronary artery disease Mother   . Heart disease Mother   . Hyperlipidemia Mother   . Peripheral vascular disease Mother        amputation  . Breast cancer Sister        x 3 sisters  . Cancer Sister   . Heart disease Father   . Hypertension Father   . Coronary artery disease Father   . Heart attack Father   . Coronary artery disease Brother   . Heart disease Brother   . Coronary artery disease Brother   . Coronary artery disease Brother   . Breast cancer Sister   . Breast cancer Sister   . Breast cancer Daughter   . Cancer Daughter   . Colon cancer Neg Hx   . Esophageal cancer Neg Hx   . Stomach cancer Neg Hx   . Rectal cancer Neg Hx          Social History:  reports that she has never smoked. She has never used smokeless tobacco. She reports current alcohol use. She reports that she does not use drugs.  Allergies:  Allergies  Allergen Reactions  . Ezetimibe Other (See Comments)    Muscle pain= zetia  . Ezetimibe-Simvastatin Other (See Comments)    Muscle pain= vytorin  . Morphine Other (See Comments)    nightmares  . Rosuvastatin Other (See Comments)    Muscle pain  . Ambien [Zolpidem Tartrate] Other (See Comments)    Fell down  . Plaquenil [Hydroxychloroquine Sulfate] Other (See Comments)    Doesn't recall reaction  . Polytrim [Polymyxin  B-Trimethoprim] Other (See Comments)    unknown  . Prevacid [Lansoprazole] Other (See Comments)    dizziness    Medications:                                                                                                                           Scheduled: .  aspirin  81 mg Oral Daily  . [START ON 11/23/2018] aspirin  81 mg Oral Pre-Cath  . clopidogrel  75 mg Oral Daily  . famotidine  20 mg Oral Daily  . ferrous sulfate  325 mg Oral Q breakfast  . isosorbide mononitrate  60 mg Oral Daily  . loratadine  10 mg Oral Daily  . metoprolol tartrate  12.5 mg Oral BID  . polyvinyl alcohol  1 drop Both Eyes BID  . regadenoson  0.4 mg Intravenous Once  . vitamin C  250 mg Oral Daily   Continuous: . [START ON 11/23/2018] sodium chloride     Followed by  . [START ON 11/23/2018] sodium chloride    . heparin 700 Units/hr (11/21/18 0541)   XTK:[id number], fluticasone, hydrALAZINE, nitroGLYCERIN, ondansetron (ZOFRAN) IV, oxyCODONE-acetaminophen, sodium chloride flush   ROS:                                                                                                                                       ROS was performed and is negative except as noted in HPI    General Examination:                                                                                                      Blood pressure 113/72, pulse (!) 58, temperature 98.4 F (36.9 C), temperature source Oral, resp. rate 20, height 5\' 2"  (1.575 m), weight 50.8 kg, SpO2 100 %.  HEENT-  Normocephalic, no lesions, without obvious abnormality.  Normal external eye and conjunctiva. Lungs-, no excessive working breathing.  Saturations within normal limits on RA Extremities- Warm, dry  and intact Musculoskeletal-no joint tenderness, deformity or swelling Skin-warm and dry, no hyperpigmentation, vitiligo, or suspicious lesions  Neurological Examination Mental Status: Alert, oriented, thought content appropriate.  Speech fluent without evidence of aphasia.  Able to follow commands without difficulty. Cranial Nerves: II:  Visual fields grossly normal,  III,IV, VI: ptosis not present, extra-ocular motions intact bilaterally, pupils equal, round, reactive to light and accommodation V,VII: smile  symmetric, facial light touch sensation normal bilaterally VIII: hearing normal bilaterally IX,X: uvula rises midline XI: bilateral shoulder shrug XII: midline tongue extension Motor: Right : Upper extremity   5/5  Left:     Upper extremity   5/5  Lower extremity   5/5   Lower extremity   5/5 Tone and bulk:normal tone throughout; no atrophy noted Sensory: pinprick/ light touch decreased in LUE. BLE intact. Vibratory sense intact throughout. Deep Tendon Reflexes: 2+ and symmetric biceps and patella Plantars: Right: downgoing   Left: downgoing Cerebellar: Normal FNF Gait: normal gait and station   Lab Results: Basic Metabolic Panel: Recent Labs  Lab 11/19/18 1528 11/20/18 0651 11/21/18 0247  NA 129* 132* 132*  K 5.4* 4.7 3.6  CL 99 101 100  CO2 21* 19* 21*  GLUCOSE 92 77 80  BUN CREATININE 1.09* 1.09* 1.01*  CALCIUM 9.6 9.2 8.6*    CBC: Recent Labs  Lab 11/19/18 1528 11/21/18 0247  WBC 5.5 5.1  HGB 11.2* 10.1*  HCT 35.3* 30.7*  MCV 88.0 83.9  PLT 202 207    Lipid Panel: Recent Labs  Lab 11/20/18 0651  CHOL 192  TRIG 53  HDL 76  CHOLHDL 2.5  VLDL 11  LDLCALC 469*    CBG: No results for input(s): GLUCAP in the last 168 hours.  Imaging: Ct Angio Chest Pe W Or Wo Contrast  Result Date: 11/19/2018 CLINICAL DATA:  Shortness of breath for 1 year EXAM: CT ANGIOGRAPHY CHEST WITH CONTRAST TECHNIQUE: Multidetector CT imaging of the chest was performed using the standard protocol during bolus administration of intravenous contrast. Multiplanar CT image reconstructions and MIPs were obtained to evaluate the vascular anatomy. CONTRAST:  OMNIPAQUE IOHEXOL 350 MG/ML SOLN COMPARISON:  CT chest February 17, 2017 FINDINGS: Cardiovascular: There is a optimal opacification of the pulmonary arteries. There is no central,segmental, or subsegmental filling defects within the pulmonary arteries. There is mild cardiomegaly. No evidence of right ventricular heart  strain. There is mitral and aortic valve calcifications. Aortic atherosclerotic calcifications are seen as on the prior exam. Calcifications seen at the origin of the great vessels. There is normal three-vessel brachiocephalic anatomy without proximal stenosis. There is an ectatic ascending thoracic aorta as on prior. Mediastinum/Nodes: No hilar, mediastinal, or axillary adenopathy. Thyroid gland, trachea, and esophagus demonstrate no significant findings. Lungs/Pleura: There is a mild amount of bibasilar dependent atelectasis. No large airspace consolidation or pleural effusion. Upper Abdomen: No acute abnormalities present in the visualized portions of the upper abdomen. Musculoskeletal: No chest wall abnormality. There is worsening compression deformity of the T6 vertebral body with 80% loss of vertebral body height, progressed from the prior radiograph of September 18, 2017. There is unchanged slight superior compression deformities of T11 and T12 with approximately 25% loss in height. Review of the MIP images confirms the above findings. IMPRESSION: 1. No central, segmental, or subsegmental pulmonary embolism. 2. Mild cardiomegaly 3.  Aortic Atherosclerosis (ICD10-I70.0). 4. Worsening in compression deformity of the T6 vertebral body with 80% loss of vertebral body height compared to prior radiograph  of September 18, 2017. Electronically Signed   By: Jonna ClarkBindu  Avutu M.D.   On: 11/19/2018 20:49   Mr Brain Wo Contrast  Result Date: 11/20/2018 CLINICAL DATA:  Initial evaluation for focal neural deficit, stroke suspected. EXAM: MRI HEAD WITHOUT CONTRAST TECHNIQUE: Multiplanar, multiecho pulse sequences of the brain and surrounding structures were obtained without intravenous contrast. COMPARISON:  Prior MRI from 01/30/2017. FINDINGS: Brain: Mild diffuse prominence of the CSF containing spaces compatible with generalized age-related cerebral atrophy. Patchy and confluent T2/FLAIR hyperintensity within the periventricular deep  white matter both cerebral hemispheres most consistent with chronic small vessel ischemic disease, moderate nature. Superimposed small remote cortical infarct present at the right parietal lobe. Few small remote bilateral cerebellar infarcts. Tiny remote lacunar infarcts noted within the thalami. No abnormal foci of restricted diffusion to suggest acute or subacute ischemia. Gray-white matter differentiation maintained. No other areas of remote cortical infarction. No foci of susceptibility artifact to suggest acute or chronic intracranial hemorrhage. No mass lesion, midline shift or mass effect. No hydrocephalus. No extra-axial fluid collection. Pituitary gland suprasellar region normal. Midline structures intact. Vascular: Major intracranial vascular flow voids maintained. Skull and upper cervical spine: Craniocervical junction within normal limits. Prominent degenerative spondylolysis noted at C5-6 without significant stenosis. Bone marrow signal intensity normal. No scalp soft tissue abnormality. Sinuses/Orbits: Patient status post ocular lens replacement on the right. Globes and orbital soft tissues demonstrate no acute finding. Paranasal sinuses are clear. No mastoid effusion. Inner ear structures normal. Other: None. IMPRESSION: 1. No acute intracranial abnormality. 2. Age-related cerebral atrophy with moderate chronic microvascular ischemic disease, with superimposed remote infarcts involving the right parietal lobe, bilateral thalami, and cerebellum. Electronically Signed   By: Rise MuBenjamin  McClintock M.D.   On: 11/20/2018 03:26   Mr Cervical Spine Wo Contrast  Result Date: 11/20/2018 CLINICAL DATA:  Initial evaluation for radiculopathy. Left arm numbness. EXAM: MRI CERVICAL SPINE WITHOUT CONTRAST TECHNIQUE: Multiplanar, multisequence MR imaging of the cervical spine was performed. No intravenous contrast was administered. COMPARISON:  Prior radiograph from 08/19/2017 FINDINGS: Alignment: Mild exaggeration  of the normal cervical lordosis. Trace grade 1 anterolisthesis of C6 on C7, C7 on T1 and T1 on T2, chronic and facet mediated. Vertebrae: Vertebral body height maintained without evidence for acute or chronic fracture. Bone marrow signal intensity within normal limits. No discrete or worrisome osseous lesions. Prominent reactive endplate changes noted about the C5-6 interspace. No other abnormal marrow edema. Cord: Signal intensity within the cervical spinal cord is normal. Posterior Fossa, vertebral arteries, paraspinal tissues: Visualized brain and posterior fossa within normal limits. Craniocervical junction normal. Paraspinous and prevertebral soft tissues within normal limits. Normal intravascular flow voids seen within the vertebral arteries bilaterally. Disc levels: C2-C3: Chronic intervertebral disc space narrowing without significant disc bulge. No significant canal or foraminal stenosis. C3-C4: Chronic intervertebral disc space narrowing. Left paracentral disc osteophyte indents the ventral thecal sac, contacting the left ventral cord. Mild spinal stenosis without cord deformity. Left C3-4 facet is ankylosed. No significant foraminal narrowing. C4-C5: Chronic intervertebral disc space narrowing with diffuse degenerative disc osteophyte, asymmetric to the left. Flattening and partial effacement of the ventral thecal sac with resultant mild spinal stenosis. No cord deformity. Left greater than right facet hypertrophy. Moderate left worse than right C5 foraminal narrowing. C5-C6: Chronic intervertebral disc space narrowing with diffuse degenerative disc osteophyte. Associated prominent reactive endplate changes. Associated broad posterior component partially effaces the ventral thecal sac, contacting the ventral spinal cord. Mild spinal stenosis without cord deformity. Moderate bilateral  C6 foraminal narrowing. C6-C7: Trace anterolisthesis. Chronic intervertebral disc space narrowing with mild disc bulge.  Right worse than left facet hypertrophy. No significant spinal stenosis. Foramina remain patent. C7-T1: Mild anterolisthesis. Associated broad posterior pseudo disc bulge/uncovering. Bilateral facet hypertrophy. No canal or foraminal stenosis. Visualized upper thoracic spine demonstrates no significant finding. IMPRESSION: 1. Left paracentral disc osteophyte at C3-4, contacting the left ventral spinal cord and resulting in mild spinal stenosis. The ventral left C4 nerve root could be affected. 2. Degenerative disc osteophyte with facet degeneration at C4-5 and C5-6 with resultant mild canal with moderate bilateral C5 and C6 foraminal stenosis, worse on the left. Either of the C5 or C6 nerve roots could be affected. 3. Prominent discogenic reactive endplate changes about the C5-6 interspace, which could contribute to underlying neck pain. Electronically Signed   By: Rise Mu M.D.   On: 11/20/2018 04:55   Nm Myocar Multi W/spect W/wall Motion / Ef  Result Date: 11/20/2018  There was no ST segment deviation noted during stress.  Findings consistent with ischemia.  This is an intermediate risk study.  The left ventricular ejection fraction is moderately decreased (30-44%).  Moderate area of severe ischemia in the inferior to inferior lateal wall at mid and basal level Inferior wall hypokinesis EF estimated at 39% Intermediate risk study base on size/severity of ischemia Area and depressed EF   Dg Chest Port 1 View  Result Date: 11/19/2018 CLINICAL DATA:  Pt complains of SOB x45yr but states that the reason she came in what because her family "made her" because of her left arm numbness 2-3 days ago EXAM: PORTABLE CHEST 1 VIEW COMPARISON:  02/17/2017 FINDINGS: Heart size is normal. Stable mild elevation of the RIGHT hemidiaphragm. The lungs are free of focal consolidations and pleural effusions. No pulmonary edema. Visualized osseous structures have a normal appearance. IMPRESSION: No active disease.  Electronically Signed   By: Norva Pavlov M.D.   On: 11/19/2018 15:45   Vas [place] Lower Extremity Venous (dvt)  Result Date: 11/21/2018  Lower Venous Study Indications: Elevated D-dimer, edema.  Comparison Study: Prior negative left LEV study done 08/22/15 is available for                   comparison Performing Technologist: Sherren Kerns RVS  Examination Guidelines: A complete evaluation includes B-mode imaging, spectral Doppler, color Doppler, and power Doppler as needed of all accessible portions of each vessel. Bilateral testing is considered an integral part of a complete examination. Limited examinations for reoccurring indications may be performed as noted.  +---------+---------------+---------+-----------+----------+--------------+ RIGHT    CompressibilityPhasicitySpontaneityPropertiesThrombus Aging +---------+---------------+---------+-----------+----------+--------------+ CFV      Full           Yes      Yes                                 +---------+---------------+---------+-----------+----------+--------------+ SFJ      Full                                                        +---------+---------------+---------+-----------+----------+--------------+ FV Prox  Full                                                        +---------+---------------+---------+-----------+----------+--------------+  FV Mid   Full                                                        +---------+---------------+---------+-----------+----------+--------------+ FV DistalFull                                                        +---------+---------------+---------+-----------+----------+--------------+ PFV      Full                                                        +---------+---------------+---------+-----------+----------+--------------+ POP      Full           Yes      Yes                                  +---------+---------------+---------+-----------+----------+--------------+ PTV      Full                                                        +---------+---------------+---------+-----------+----------+--------------+ PERO     Full                                                        +---------+---------------+---------+-----------+----------+--------------+   +---------+---------------+---------+-----------+----------+--------------+ LEFT     CompressibilityPhasicitySpontaneityPropertiesThrombus Aging +---------+---------------+---------+-----------+----------+--------------+ CFV      Full           Yes      Yes                                 +---------+---------------+---------+-----------+----------+--------------+ SFJ      Full                                                        +---------+---------------+---------+-----------+----------+--------------+ FV Prox  Full                                                        +---------+---------------+---------+-----------+----------+--------------+ FV Mid   Full                                                        +---------+---------------+---------+-----------+----------+--------------+  FV DistalFull                                                        +---------+---------------+---------+-----------+----------+--------------+ PFV      Full                                                        +---------+---------------+---------+-----------+----------+--------------+ POP      Full           Yes      Yes                                 +---------+---------------+---------+-----------+----------+--------------+ PTV      Full                                                        +---------+---------------+---------+-----------+----------+--------------+ PERO     None                                         Acute           +---------+---------------+---------+-----------+----------+--------------+     Summary: Right: There is no evidence of deep vein thrombosis in the lower extremity. Left: Findings consistent with acute deep vein thrombosis involving the left peroneal veins.  *See table(s) above for measurements and observations.    Preliminary        Valentina Lucks, MSN, NP-C [facility] [phone]  11/21/2018, 2:44 PM   Attending physician note to follow with Assessment and plan .   Assessment: 83 y.o. female PMH HTN, HLD, SVT, CHF 9 diastolic), CAD, CKD 2, right bundle branch blockage who presented to [facility] with SOB and left arm numbness. MRI: no acute abnormality.  Ordering a repeat MRI. Concerned for stroke extension d/t spreading numbness.    Stroke Risk Factors - hyperlipidemia and hypertension    Recommendations: -- repeat MRI  NEUROHOSPITALIST ADDENDUM Performed a face to face diagnostic evaluation.   I have reviewed the contents of history and physical exam as documented by PA/ARNP/Resident and agree with above documentation.  I have discussed and formulated the above plan as documented. Edits to the note have been made as needed.  83 y.o. female with past medical history significant for hypertension, hyperlipidemia, CHF, coronary artery disease, CKD admitted for chest pain, shortness of breath and left arm numbness.  Patient also underwent MRI brain and MRI C-spine for left arm numbness -MRI brain was negative for stroke and MRI C-spine showed some C3-C4 paracentral disc osteophyte causing mild spinal stenosis, mild to moderate C5-C6 foraminal stenosis.  Her symptoms had improved, but later today patient noticed that her entire left arm and leg was numb and tingling.  She denies any involvement of her face. Neurology was consulted for her worsening of her left-sided symptoms.  She was assessed by nurse practitioner earlier in  the afternoon and noted to have only numbness over the  left upper extremity. On my assessment patient denies any sensory deficits: Light touch and pinprick are intact and symmetric bilaterally in both upper extremities and lower extremities.  Per her son, patient does often complain/exaggerates some of her symptoms at times.  The lack of involvement of her face makes this less likely to be a stroke. However, given her significant vascular risk factors, we recommend repeating MRI brain to rule out thalamic infarct that may have been missed on the initial MRI brain.  If negative I do not think she needs to have further work-up for her symptoms.     Georgiana Spinner Aroor MD [facility] [phone]   If 7pm to 7am, please call on call as listed on AMION.

## 2018-11-21 NOTE — Progress Notes (Signed)
Chaplain responded to referral from the nurse for prayer.  Patient did not remember requesting a chaplain and expressed confusion as to the reason they were in the hospital.  The patient also expressed sadness because of all the loved ones they had lost over the past year.  The chaplain provided an empathetic ear and offered a word of prayer.  Chaplain assess that no follow-up is needed.  Brion Aliment Chaplain Resident For questions concerning this note please contact me by pager [phone]

## 2018-11-22 ENCOUNTER — Inpatient Hospital Stay (HOSPITAL_COMMUNITY): Payer: Medicare Other

## 2018-11-22 DIAGNOSIS — I214 Non-ST elevation (NSTEMI) myocardial infarction: Principal | ICD-10-CM

## 2018-11-22 LAB — BASIC METABOLIC PANEL
Anion gap: 8 (ref 5–15)
BUN: 17 mg/dL (ref 8–23)
CO2: 22 mmol/L (ref 22–32)
Calcium: 8.9 mg/dL (ref 8.9–10.3)
Chloride: 101 mmol/L (ref 98–111)
Creatinine, Ser: 1.13 mg/dL — ABNORMAL HIGH (ref 0.44–1.00)
GFR calc Af Amer: 53 mL/min — ABNORMAL LOW (ref >60)
GFR calc non Af Amer: 46 mL/min — ABNORMAL LOW (ref >60)
Glucose, Bld: 84 mg/dL (ref 70–99)
Potassium: 3.8 mmol/L (ref 3.5–5.1)
Sodium: 131 mmol/L — ABNORMAL LOW (ref 135–145)

## 2018-11-22 LAB — CBC
HCT: 30 % — ABNORMAL LOW (ref 36.0–46.0)
Hemoglobin: 9.9 g/dL — ABNORMAL LOW (ref 12.0–15.0)
MCH: 27.9 pg (ref 26.0–34.0)
MCHC: 33 g/dL (ref 30.0–36.0)
MCV: 84.5 fL (ref 80.0–100.0)
Platelets: 194 10*3/uL (ref 150–400)
RBC: 3.55 MIL/uL — ABNORMAL LOW (ref 3.87–5.11)
RDW: 15.1 % (ref 11.5–15.5)
WBC: 5.8 10*3/uL (ref 4.0–10.5)
nRBC: 0 % (ref 0.0–0.2)

## 2018-11-22 LAB — HEPARIN LEVEL (UNFRACTIONATED): Heparin Unfractionated: 0.48 IU/mL (ref 0.30–0.70)

## 2018-11-22 MED ORDER — ENSURE ENLIVE PO LIQD
237.0000 mL | Freq: Two times a day (BID) | ORAL | Status: DC
  Administered 2018-11-22 (×2): 237 mL via ORAL

## 2018-11-22 MED ORDER — ADULT MULTIVITAMIN W/MINERALS CH
1.0000 | ORAL_TABLET | Freq: Every day | ORAL | Status: DC
  Administered 2018-11-22 – 2018-11-24 (×3): 1 via ORAL
  Filled 2018-11-22 (×3): qty 1

## 2018-11-22 NOTE — Progress Notes (Signed)
Pt w/ recurrent transient L arm numbness for which initial MRI was neg. Repeat MRI also neg for acute stroke.  Agree with Dr. Lorraine Lax. No further stroke workup indicated given neg MRI.  Stroke team will sign off. Please contact us for any further concerns.  Burnetta Sabin, MSN, APRN, ANVP-BC, AGPCNP-BC Advanced Practice Stroke Nurse [place] for Schedule & Pager information 11/22/2018 2:10 PM

## 2018-11-22 NOTE — Progress Notes (Signed)
PROGRESS NOTE    [name]  KXF:[id number] DOB: 10/31/1935 DOA: 11/19/2018 PCP: Laurann MontanaWhite, Cynthia, MD    Brief Narrative:  83 year old female with history of coronary artery disease, presents to the hospital with complaints of shortness of breath and left arm numbness.  She has mildly elevated troponin.  She was seen by cardiology who performed a stress test which showed depressed EF of 39% and areas of ischemia.  Plans are for cardiac cath on 9/8.  She is anticoagulated with heparin.  For her left arm numbness, MRI imaging of brain did not show any evidence of stroke.  MRI C-spine did show degenerative changes and spinal stenosis. - previously failed failed PCI/PDA in 2016 and 2017 with wire dissection,  LHC in 2017 with moderate circumflex and LAD disease with occluded PDA, nuclear stress test 11/20/2018 with moderate area of severe inferior/inferior lateral wall ischemia with mildly elevated troponin -LHC planned for 11/23/2018  Assessment & Plan:   Principal Problem:   SOB (shortness of breath) Active Problems:   Dyslipidemia-statin and Zetia intol   Iron deficiency anemia   CAD S/P unsuccessful PCI 08/18/14   GERD   Essential hypertension   CKD (chronic kidney disease), stage II   Elevated troponin   Hyponatremia   Hyperkalemia   Chronic diastolic (congestive) heart failure (HCC)   Left arm numbness   Leg DVT (deep venous thromboembolism), acute, left (HCC)   1. Elevated troponin.  NSTEMI versus demand ischemia.  She has known history of coronary artery disease.  Cardiology following.  She had Myoview on 9/5 that showed moderate area of severe inferior/inferolateral wall ischemia and decline in EF to 39% EF was normal on echo from 04/2018.  [facility]  She is continued on aspirin and Plavix, beta-blockers and nitrates.  She is anticoagulated with intravenous heparin. 2. Acute left lower extremity DVT.  Noted on venous Dopplers.  Patient had elevated d-dimer on admission.   CT angiogram of the chest was negative for PE.  Continue on intravenous heparin. 3. Acute on chronic diastolic congestive heart failure.  She received a dose of intravenous Lasix yesterday.  Overall volume status appears to be improved.  She is not short of breath. 4. Left sided numbness.  Patient initially presented with left arm numbness, but feels that this has progressed to her entire left side.  Initial MRI of the brain did not show any evidence of stroke.  MRI of the C-spine showed cervical disc disease and spinal stenosis.  Neurology input appreciated, neurology signed off as of 11/22/2018  5. hyponatremia.  Asymptomatic.  Stable. 6. Chronic iron deficiency anemia.  Continue oral iron.   DVT prophylaxis: heparin infusion Code Status: full code Family Communication: Previously discussed with with son, Dr. Abel Prestoom Merendino over the phone Disposition Plan: discharge home after cardiac catheterization if stable   Consultants:   Cardiology  Neurology  Procedures:     Antimicrobials:       Subjective: --Intermittent transient left-sided paresthesia -No chest pains or shortness of breath at this time  Objective: Vitals:   11/22/18 0513 11/22/18 1021 11/22/18 1026 11/22/18 1151  BP: (!) 102/57 (!) 104/56  (!) 102/56  Pulse: 68 63  (!) 57  Resp: 15  18 16   Temp: 98.4 F (36.9 C)  98 F (36.7 C) 97.9 F (36.6 C)  TempSrc: Oral  Oral Oral  SpO2: 97% 98%  98%  Weight: 51.2 kg     Height:  Intake/Output Summary (Last 24 hours) at 11/22/2018 1733 Last data filed at 11/22/2018 1600 Gross per 24 hour  Intake 1375 ml  Output 400 ml  Net 975 ml   Filed Weights   11/20/18 1630 11/21/18 0625 11/22/18 0513  Weight: 50.2 kg 50.8 kg 51.2 kg    Examination:  General exam: Appears calm and comfortable  Respiratory system: Clear to auscultation. Respiratory effort normal. Cardiovascular system: S1 & S2 heard, RRR. No JVD, murmurs, rubs, gallops or clicks. No pedal  edema. Gastrointestinal system: Abdomen is nondistended, soft and nontender. No organomegaly or masses felt. Normal bowel sounds heard. Central nervous system: Alert and oriented. Strength is equal bilaterally. Extremities: Symmetric 5 x 5 power. Skin: No rashes, lesions or ulcers Psychiatry: Judgement and insight appear normal. Mood & affect appropriate.     Data Reviewed:   CBC: Recent Labs  Lab 11/19/18 1528 11/21/18 0247 11/22/18 0326  WBC 5.5 5.1 5.8  HGB 11.2* 10.1* 9.9*  HCT 35.3* 30.7* 30.0*  MCV 88.0 83.9 84.5  PLT 202 207 194   Basic Metabolic Panel: Recent Labs  Lab 11/19/18 1528 11/20/18 0651 11/21/18 0247 11/22/18 0326  NA 129* 132* 132* 131*  K 5.4* 4.7 3.6 3.8  CL 99 101 100 101  CO2 21* 19* 21* 22  GLUCOSE 92 77 80 84  BUN 11 11 11 17   CREATININE 1.09* 1.09* 1.01* 1.13*  CALCIUM 9.6 9.2 8.6* 8.9   GFR: Estimated Creatinine Clearance: 31.4 mL/min (A) (by C-G formula based on SCr of 1.13 mg/dL (H)). Liver Function Tests: No results for input(s): AST, ALT, ALKPHOS, BILITOT, PROT, ALBUMIN in the last 168 hours. No results for input(s): LIPASE, AMYLASE in the last 168 hours. No results for input(s): AMMONIA in the last 168 hours. Coagulation Profile: No results for input(s): INR, PROTIME in the last 168 hours. Cardiac Enzymes: No results for input(s): CKTOTAL, CKMB, CKMBINDEX, TROPONINI in the last 168 hours. BNP (last 3 results) Recent Labs    04/14/18 1517  PROBNP 982*   HbA1C: Recent Labs    11/20/18 0630  HGBA1C 5.1   CBG: No results for input(s): GLUCAP in the last 168 hours. Lipid Profile: Recent Labs    11/20/18 0651  CHOL 192  HDL 76  LDLCALC 105*  TRIG 53  CHOLHDL 2.5   Thyroid Function Tests: Recent Labs    11/20/18 0630  TSH 2.290   Anemia Panel: No results for input(s): VITAMINB12, FOLATE, FERRITIN, TIBC, IRON, RETICCTPCT in the last 72 hours. Sepsis Labs: No results for input(s): PROCALCITON, LATICACIDVEN in the  last 168 hours.  Recent Results (from the past 240 hour(s))  SARS CORONAVIRUS 2 (TAT 6-24 HRS) Nasopharyngeal Nasopharyngeal Swab     Status: None   Collection Time: 11/19/18  5:40 PM   Specimen: Nasopharyngeal Swab  Result Value Ref Range Status   SARS Coronavirus 2 NEGATIVE NEGATIVE Final    Comment: (NOTE) SARS-CoV-2 target nucleic acids are NOT DETECTED. The SARS-CoV-2 RNA is generally detectable in upper and lower respiratory specimens during the acute phase of infection. Negative results do not preclude SARS-CoV-2 infection, do not rule out co-infections with other pathogens, and should not be used as the sole basis for treatment or other patient management decisions. Negative results must be combined with clinical observations, patient history, and epidemiological information. The expected result is Negative. Fact Sheet for Patients: HairSlick.nohttps://www.fda.gov/media/138098/download Fact Sheet for Healthcare Providers: quierodirigir.comhttps://www.fda.gov/media/138095/download This test is not yet approved or cleared by the [place] FDA and  has been authorized for detection and/or diagnosis of SARS-CoV-2 by FDA under an Emergency Use Authorization (EUA). This EUA will remain  in effect (meaning this test can be used) for the duration of the COVID-19 declaration under Section 56 4(b)(1) of the Act, 21 U.S.C. section 360bbb-3(b)(1), unless the authorization is terminated or revoked sooner. Performed at [facility], [address] [place], [place] [address]          Radiology Studies: Mr Brain Wo Contrast  Result Date: 11/22/2018 CLINICAL DATA:  Left arm numbness EXAM: MRI HEAD WITHOUT CONTRAST TECHNIQUE: Diffusion only imaging was requested for follow-up purposes COMPARISON:  Two days ago FINDINGS: Diffusion imaging is negative for acute infarct. No hydrocephalus, collection, or mass effect. No new finding. IMPRESSION: Diffusion only brain MRI which is negative for acute infarct or  other interval change. Electronically Signed   By: Marnee Spring M.D.   On: 11/22/2018 13:57   Vas [place] Lower Extremity Venous (dvt)  Result Date: 11/22/2018  Lower Venous Study Indications: Elevated D-dimer, edema.  Comparison Study: Prior negative left LEV study done 08/22/15 is available for                   comparison Performing Technologist: Sherren Kerns RVS  Examination Guidelines: A complete evaluation includes B-mode imaging, spectral Doppler, color Doppler, and power Doppler as needed of all accessible portions of each vessel. Bilateral testing is considered an integral part of a complete examination. Limited examinations for reoccurring indications may be performed as noted.  +---------+---------------+---------+-----------+----------+--------------+  RIGHT     Compressibility Phasicity Spontaneity Properties Thrombus Aging  +---------+---------------+---------+-----------+----------+--------------+  CFV       Full            Yes       Yes                                    +---------+---------------+---------+-----------+----------+--------------+  SFJ       Full                                                             +---------+---------------+---------+-----------+----------+--------------+  FV Prox   Full                                                             +---------+---------------+---------+-----------+----------+--------------+  FV Mid    Full                                                             +---------+---------------+---------+-----------+----------+--------------+  FV Distal Full                                                             +---------+---------------+---------+-----------+----------+--------------+  PFV       Full                                                             +---------+---------------+---------+-----------+----------+--------------+  POP       Full            Yes       Yes                                     +---------+---------------+---------+-----------+----------+--------------+  PTV       Full                                                             +---------+---------------+---------+-----------+----------+--------------+  PERO      Full                                                             +---------+---------------+---------+-----------+----------+--------------+   +---------+---------------+---------+-----------+----------+--------------+  LEFT      Compressibility Phasicity Spontaneity Properties Thrombus Aging  +---------+---------------+---------+-----------+----------+--------------+  CFV       Full            Yes       Yes                                    +---------+---------------+---------+-----------+----------+--------------+  SFJ       Full                                                             +---------+---------------+---------+-----------+----------+--------------+  FV Prox   Full                                                             +---------+---------------+---------+-----------+----------+--------------+  FV Mid    Full                                                             +---------+---------------+---------+-----------+----------+--------------+  FV Distal Full                                                             +---------+---------------+---------+-----------+----------+--------------+  PFV       Full                                                             +---------+---------------+---------+-----------+----------+--------------+  POP       Full            Yes       Yes                                    +---------+---------------+---------+-----------+----------+--------------+  PTV       Full                                                             +---------+---------------+---------+-----------+----------+--------------+  PERO      None                                             Acute            +---------+---------------+---------+-----------+----------+--------------+     Summary: Right: There is no evidence of deep vein thrombosis in the lower extremity. Left: Findings consistent with acute deep vein thrombosis involving the left peroneal veins.  *See table(s) above for measurements and observations. Electronically signed by Harold Barban MD on 11/22/2018 at 1:11:43 AM.    Final         Scheduled Meds:  aspirin  81 mg Oral Daily   [START ON 11/23/2018] aspirin  81 mg Oral Pre-Cath   clopidogrel  75 mg Oral Daily   famotidine  20 mg Oral Daily   feeding supplement (ENSURE ENLIVE)  237 mL Oral BID BM   ferrous sulfate  325 mg Oral Q breakfast   isosorbide mononitrate  60 mg Oral Daily   loratadine  10 mg Oral Daily   metoprolol tartrate  12.5 mg Oral BID   multivitamin with minerals  1 tablet Oral Daily   polyvinyl alcohol  1 drop Both Eyes BID   regadenoson  0.4 mg Intravenous Once   vitamin C  250 mg Oral Daily   Continuous Infusions:  [START ON 11/23/2018] sodium chloride     Followed by   Derrill Memo ON 11/23/2018] sodium chloride     heparin 700 Units/hr (11/21/18 0541)     LOS: 2 days   Roxan Hockey, MD [facility]   If 7PM-7AM, please contact night-coverage [email or web address]  11/22/2018, 5:33 PM

## 2018-11-22 NOTE — Progress Notes (Signed)
Initial Nutrition Assessment  RD working remotely.  DOCUMENTATION CODES:   Not applicable  INTERVENTION:   -Continue 250 mg vitamin C daily -MVI with minerals daily -Ensure Enlive po BID, each supplement provides 350 kcal and 20 grams of protein -Downgrade diet to dysphagia 3 (advanced mechanical soft) for ease of intake  NUTRITION DIAGNOSIS:   Inadequate oral intake related to mouth pain as evidenced by meal completion < 50%, per patient/family report.  GOAL:   Patient will meet greater than or equal to 90% of their needs  MONITOR:   PO intake, Supplement acceptance, Diet advancement, Labs, Weight trends, Skin, I & O's  REASON FOR ASSESSMENT:   Consult Diet education, Poor PO  ASSESSMENT:   [name] is a 83 y.o. female with medical history significant of hypertension, hyperlipidemia, GERD, SVT, Sjogren's disease, dCHF, CAD, iron deficiency anemia, CKD 2, right bundle blockage, who presents with shortness of breath, left arm numbness and weakness  Pt admitted with SOB (non-STEMI vs demand ischemia) and lower extremity DVT.   Reviewed I/O's: +327 ml x 24 hours and -825 ml since admission  UOP: 800 ml x 24 hours  Pt evaluated by neurology for lt arm numbness; recommend repeat MRI of brain.   Spoke with pt over the phone, who was pleasant and in good spirits today. Pt reports that she has been having difficulty eating secondary to bleeding spots on her lips; pt explains that these areas open intermittently and bleed and when they do, they are very painful. Pt shares that due to her mouth pain, she is unable to open her mouth very wide to eat. She estimates she consumed about 50% of her breakfast this morning. Documented meal completion 10-100% (averaging around 50% of meals).   PTA pt consumes 3 meals per day (Breakfast: scrambled eggs, cheese, and bell peppers; Lunch: open faced sandwich with one slice of bread, [place], and lettuce, Dinner: meat, starch, and vegetable  OR open faced sandwich).Pt intermittently drinks Ensure, but it has been a while since she last drank it.    Reviewed wt hx; pt has experienced a 7% wt loss over the past 7 months. Per pt, UBW is around 110# and does not think she has lost weight recently.   Discussed importance of good meal and supplement intake to promote healing. Pt amenable to Ensure supplements, as well as downgrading diet to mechanical soft for ease of intake. Also discussed floor stock items available to pt for beverages.   Labs reviewed: Na: 132.   Diet Order:   Diet Order            Diet NPO time specified  Diet effective now        Diet Heart Room service appropriate? Yes; Fluid consistency: Thin  Diet effective now              EDUCATION NEEDS:   Education needs have been addressed  Skin:  Skin Assessment: Reviewed RN Assessment  Last BM:  11/21/18  Height:   Ht Readings from Last 1 Encounters:  11/19/18 5\' 2"  (1.575 m)    Weight:   Wt Readings from Last 1 Encounters:  11/22/18 51.2 kg    Ideal Body Weight:  50 kg  BMI:  Body mass index is 20.63 kg/m.  Estimated Nutritional Needs:   Kcal:  1350-1550  Protein:  60-75 grams  Fluid:  > 1.3 L    Miela Desjardin A. Jimmye Norman, RD, LDN, [place] Registered Dietitian II Certified Diabetes Care and Education  Specialist Pager: [phone] After hours Pager: [phone]

## 2018-11-22 NOTE — Progress Notes (Signed)
Progress Note  Patient Name: [name] Date of Encounter: 11/22/2018  Primary Cardiologist: Sherren Mocha, MD   Subjective   83 year old female with known coronary artery disease with failed PCI/PDA in 2016 with wire dissection, cath 2017 with moderate circumflex and LAD disease with occluded PDA, nuclear stress test 11/20/2018 with moderate area of severe inferior/inferior lateral wall ischemia with mildly elevated troponin on for cardiac catheterization Tuesday.  Inpatient Medications    Scheduled Meds: . aspirin  81 mg Oral Daily  . [START ON 11/23/2018] aspirin  81 mg Oral Pre-Cath  . clopidogrel  75 mg Oral Daily  . famotidine  20 mg Oral Daily  . ferrous sulfate  325 mg Oral Q breakfast  . isosorbide mononitrate  60 mg Oral Daily  . loratadine  10 mg Oral Daily  . metoprolol tartrate  12.5 mg Oral BID  . polyvinyl alcohol  1 drop Both Eyes BID  . regadenoson  0.4 mg Intravenous Once  . vitamin C  250 mg Oral Daily   Continuous Infusions: . [START ON 11/23/2018] sodium chloride     Followed by  . [START ON 11/23/2018] sodium chloride    . heparin 700 Units/hr (11/21/18 0541)   PRN Meds: acetaminophen, fluticasone, hydrALAZINE, nitroGLYCERIN, ondansetron (ZOFRAN) IV, oxyCODONE-acetaminophen, sodium chloride flush   Vital Signs    Vitals:   11/21/18 1053 11/21/18 1155 11/21/18 2027 11/22/18 0513  BP: 124/65 113/72 (!) 111/58 (!) 102/57  Pulse: 63 (!) 58 79 68  Resp:  20 20 15   Temp: 99.3 F (37.4 C) 98.4 F (36.9 C) 98.2 F (36.8 C) 98.4 F (36.9 C)  TempSrc: Oral Oral Oral Oral  SpO2: 99% 100% 100% 97%  Weight:    51.2 kg  Height:        Intake/Output Summary (Last 24 hours) at 11/22/2018 0856 Last data filed at 11/22/2018 0841 Gross per 24 hour  Intake 1007.3 ml  Output 800 ml  Net 207.3 ml   Last 3 Weights 11/22/2018 11/21/2018 11/20/2018  Weight (lbs) 112 lb 12.8 oz 112 lb 1.6 oz 110 lb 9.6 oz  Weight (kg) 51.166 kg 50.848 kg 50.168 kg      Telemetry     Sinus rhythm no adverse arrhythmias- Personally Reviewed  ECG    Sinus rhythm, QTC 498 prolonged- Personally Reviewed  Physical Exam   GEN: No acute distress.   Neck: No JVD, dry lips Cardiac: RRR, no murmurs, rubs, or gallops.  Respiratory: Clear to auscultation bilaterally. GI: Soft, nontender, non-distended  MS: No edema; No deformity. Neuro:  Nonfocal  Psych: Normal affect   Labs    High Sensitivity Troponin:   Recent Labs  Lab 11/19/18 1728 11/19/18 2130 11/19/18 2355 11/20/18 0651 11/20/18 0957  TROPONINIHS 85* 81* 197* 246* 237*      Chemistry Recent Labs  Lab 11/20/18 0651 11/21/18 0247 11/22/18 0326  NA 132* 132* 131*  K 4.7 3.6 3.8  CL 101 100 101  CO2 19* 21* 22  GLUCOSE 77 80 84  BUN 11 11 17   CREATININE 1.09* 1.01* 1.13*  CALCIUM 9.2 8.6* 8.9  GFRNONAA 48* 53* 46*  GFRAA 56* >60 53*  ANIONGAP 12 11 8      Hematology Recent Labs  Lab 11/19/18 1528 11/21/18 0247 11/22/18 0326  WBC 5.5 5.1 5.8  RBC 4.01 3.66* 3.55*  HGB 11.2* 10.1* 9.9*  HCT 35.3* 30.7* 30.0*  MCV 88.0 83.9 84.5  MCH 27.9 27.6 27.9  MCHC 31.7 32.9 33.0  RDW 15.4 15.1 15.1  PLT 202 207 194    BNP Recent Labs  Lab 11/19/18 2130  BNP 794.7*     DDimer  Recent Labs  Lab 11/19/18 1528  DDIMER 0.57*     Radiology    Nm Myocar Multi W/spect W/wall Motion / Ef  Result Date: 11/20/2018  There was no ST segment deviation noted during stress.  Findings consistent with ischemia.  This is an intermediate risk study.  The left ventricular ejection fraction is moderately decreased (30-44%).  Moderate area of severe ischemia in the inferior to inferior lateal wall at mid and basal level Inferior wall hypokinesis EF estimated at 39% Intermediate risk study base on size/severity of ischemia Area and depressed EF   Vas [place] Lower Extremity Venous (dvt)  Result Date: 11/22/2018  Lower Venous Study Indications: Elevated D-dimer, edema.  Comparison Study: Prior negative left  LEV study done 08/22/15 is available for                   comparison Performing Technologist: Sherren Kernsandace Kanady RVS  Examination Guidelines: A complete evaluation includes B-mode imaging, spectral Doppler, color Doppler, and power Doppler as needed of all accessible portions of each vessel. Bilateral testing is considered an integral part of a complete examination. Limited examinations for reoccurring indications may be performed as noted.  +---------+---------------+---------+-----------+----------+--------------+ RIGHT    CompressibilityPhasicitySpontaneityPropertiesThrombus Aging +---------+---------------+---------+-----------+----------+--------------+ CFV      Full           Yes      Yes                                 +---------+---------------+---------+-----------+----------+--------------+ SFJ      Full                                                        +---------+---------------+---------+-----------+----------+--------------+ FV Prox  Full                                                        +---------+---------------+---------+-----------+----------+--------------+ FV Mid   Full                                                        +---------+---------------+---------+-----------+----------+--------------+ FV DistalFull                                                        +---------+---------------+---------+-----------+----------+--------------+ PFV      Full                                                        +---------+---------------+---------+-----------+----------+--------------+  POP      Full           Yes      Yes                                 +---------+---------------+---------+-----------+----------+--------------+ PTV      Full                                                        +---------+---------------+---------+-----------+----------+--------------+ PERO     Full                                                         +---------+---------------+---------+-----------+----------+--------------+   +---------+---------------+---------+-----------+----------+--------------+ LEFT     CompressibilityPhasicitySpontaneityPropertiesThrombus Aging +---------+---------------+---------+-----------+----------+--------------+ CFV      Full           Yes      Yes                                 +---------+---------------+---------+-----------+----------+--------------+ SFJ      Full                                                        +---------+---------------+---------+-----------+----------+--------------+ FV Prox  Full                                                        +---------+---------------+---------+-----------+----------+--------------+ FV Mid   Full                                                        +---------+---------------+---------+-----------+----------+--------------+ FV DistalFull                                                        +---------+---------------+---------+-----------+----------+--------------+ PFV      Full                                                        +---------+---------------+---------+-----------+----------+--------------+ POP      Full           Yes      Yes                                 +---------+---------------+---------+-----------+----------+--------------+  PTV      Full                                                        +---------+---------------+---------+-----------+----------+--------------+ PERO     None                                         Acute          +---------+---------------+---------+-----------+----------+--------------+     Summary: Right: There is no evidence of deep vein thrombosis in the lower extremity. Left: Findings consistent with acute deep vein thrombosis involving the left peroneal veins.  *See table(s) above for measurements and observations. Electronically signed by  Coral Else MD on 11/22/2018 at 1:11:43 AM.    Final     Cardiac Studies   ECHO 04/19/2018   1. The left ventricle has normal systolic function of 55-60%. The cavity size is normal. There is normal left ventricular wall thickness. Echo evidence of pseudonormal diastolic filling patterns. Elevated left ventricular end-diastolic pressure.  2. The right ventricle is mildly enlarged in size. There is normal systolic function. Right ventricular systolic pressure is mildly elevated with an estimated pressure of 36.9 mmHg.  3. Normal left atrial size.  4. Mildly dilated right atrial size.  5. The mitral valve normal in structure. There is mild thickening and mildly calcified of the anterior leaflet. There is severe mitral annular calcification present. Regurgitation is mild by color flow Doppler.  6. Tricuspid regurgitation is mild.  7. The aortic valve tricuspid. There is severe thickening and severely calcified of the aortic valve with moderately decreased cusp excursion. Aortic valve regurgitation is mild to moderate by color flow Doppler. The jet is eccentric towards the  anterior MV leaflet. The calculated aortic valve area is 1.46 cm consistent with mild stenosis.  8. The inferior vena cava was dilated in size with <50% respiratory variablity.  9. Compared to prior echo the PASP has decreased from to .  Cardiac catheterization 05/24/2015: Diagnostic Dominance: Right  Intervention      Patient Profile     83 y.o. female with coronary artery disease as described above awaiting cardiac catheterization Tuesday.  Assessment & Plan    Non-ST elevation myocardial infarction/CAD/angina - Mildly elevated high-sensitivity troponin. -On IV heparin aspirin, Plavix (not a candidate for CABG), beta-blocker, nitrates. - Orders are in for cardiac catheterization on Tuesday. - Inferior lateral/inferior wall ischemia noted on stress test. -Note CTA for chest was negative for PE -Once  again failed right PDA PCI in 2016 and 2017.  Left arm numbness -Evaluated by neurology on 11/21/2018      For questions or updates, please contact [facility] Please consult [email or web address] for contact info under        Signed, Donato Schultz, MD  11/22/2018, 8:56 AM

## 2018-11-22 NOTE — Plan of Care (Signed)
  Problem: Education: Goal: Knowledge of General Education information will improve Description: Including pain rating scale, medication(s)/side effects and non-pharmacologic comfort measures Outcome: Progressing   Problem: Health Behavior/Discharge Planning: Goal: Ability to manage health-related needs will improve Outcome: Progressing   Problem: Clinical Measurements: Goal: Ability to maintain clinical measurements within normal limits will improve Outcome: Progressing   Problem: Clinical Measurements: Goal: Ability to maintain clinical measurements within normal limits will improve Outcome: Progressing Goal: Will remain free from infection Outcome: Progressing Goal: Diagnostic test results will improve Outcome: Progressing Goal: Respiratory complications will improve Outcome: Progressing Goal: Cardiovascular complication will be avoided Outcome: Progressing   Problem: Coping: Goal: Level of anxiety will decrease Outcome: Progressing   Problem: Elimination: Goal: Will not experience complications related to bowel motility Outcome: Progressing Goal: Will not experience complications related to urinary retention Outcome: Progressing

## 2018-11-22 NOTE — Plan of Care (Signed)

## 2018-11-22 NOTE — Progress Notes (Signed)
ANTICOAGULATION CONSULT NOTE - Follow-Up Consult  Pharmacy Consult for heparin Indication: chest pain/ACS  Allergies  Allergen Reactions  . Ezetimibe Other (See Comments)    Muscle pain= zetia  . Ezetimibe-Simvastatin Other (See Comments)    Muscle pain= vytorin  . Morphine Other (See Comments)    nightmares  . Rosuvastatin Other (See Comments)    Muscle pain  . Ambien [Zolpidem Tartrate] Other (See Comments)    Fell down  . Plaquenil [Hydroxychloroquine Sulfate] Other (See Comments)    Doesn't recall reaction  . Polytrim [Polymyxin B-Trimethoprim] Other (See Comments)    unknown  . Prevacid [Lansoprazole] Other (See Comments)    dizziness    Patient Measurements: Height: 5\' 2"  (157.5 cm) Weight: 112 lb 12.8 oz (51.2 kg) IBW/kg (Calculated) : 50.1 Heparin Dosing Weight: 49.9 kg  Vital Signs: Temp: 97.9 F (36.6 C) (09/07 1151) Temp Source: Oral (09/07 1151) BP: 102/56 (09/07 1151) Pulse Rate: 57 (09/07 1151)  Labs: Recent Labs    11/19/18 1528  11/19/18 2355 11/20/18 1962 11/20/18 0957  11/21/18 0247 11/21/18 0946 11/22/18 0326  HGB 11.2*  --   --   --   --   --  10.1*  --  9.9*  HCT 35.3*  --   --   --   --   --  30.7*  --  30.0*  PLT 202  --   --   --   --   --  207  --  194  HEPARINUNFRC  --   --   --   --   --    < > 0.42 0.52 0.48  CREATININE 1.09*  --   --  1.09*  --   --  1.01*  --  1.13*  TROPONINIHS 80*   < > 197* 246* 237*  --   --   --   --    < > = values in this interval not displayed.    Estimated Creatinine Clearance: 31.4 mL/min (A) (by C-G formula based on SCr of 1.13 mg/dL (H)).   Medical History: Past Medical History:  Diagnosis Date  . Abnormal nuclear stress test 05/24/2015  . Atypical ductal hyperplasia of right breast 02/22/2014  . C. difficile colitis - recurrent 06/23/2016  . C. difficile diarrhea on po vanc 10/04/2015  . CAD (coronary artery disease) 2006, 2016   a. 08/2014 NSTEMI: Attempted PCI of 85% RPDA - unable to wire,  complicated by dissection, case aborted;  b. 08/2014 Relook Cath: LM nl, LAD 60p, SP1 90ost (small), D1 65ost, RI 45/50, RCA nl, RPDA 90 - healed dissection;  b. 08/2014 Echo: EF 55%, mild/mon midapical inferior HK, mild AI, PASP . c. Cath 05/2015 with CTO of the right-PDA and unsuccessful PCI  . CAD S/P unsuccessful PCI 08/18/14 04/20/2007  . chronic back   . CKD (chronic kidney disease), stage II   . COLONIC POLYPS, ADENOMATOUS, HX OF   . Diverticulitis   . Dyslipidemia    a. statin intolerant.  [name] Dyspnea    with exertion  . Elevated troponin level 10/04/2015  . ENDOMETRIOSIS   . Essential hypertension   . Fibromyalgia   . GASTRIC ANTRAL VASCULAR ECTASIA   . GERD   . History of hiatal hernia   . Hypertension, uncontrolled 10/04/2015  . Insomnia   . IRON DEFICIENCY ANEMIA SECONDARY TO BLOOD LOSS   . Myocardial infarction (HCC) 08/2014  . Neuropathy   . NSTEMI (non-ST elevated myocardial infarction) (HCC) 08/18/2014  .  Osteoarthritis    "back, hips, basically all over" (05/24/2015)  . Palpitations 03/06/2011  . Paresthesia 08/01/2014  . Raynaud's syndrome   . Rosacea, acne   . Sicca syndrome ([place])   . SJOGREN'S SYNDROME   . SUPRAVENTRICULAR TACHYCARDIA   . Vitamin D deficiency     Medications:  Medications Prior to Admission  Medication Sig Dispense Refill Last Dose  . acetaminophen (TYLENOL) 500 MG tablet Take 1,000 mg by mouth 3 (three) times daily.   11/19/2018 at Unknown time  . amLODipine (NORVASC) 2.5 MG tablet Take 2.5 mg by mouth daily.   11/19/2018 at Unknown time  . aspirin 81 MG tablet Take 81 mg by mouth daily.   11/19/2018 at 0830  . clopidogrel (PLAVIX) 75 MG tablet TAKE 1 TABLET BY MOUTH ONCE DAILY WITH BREAKFAST (Patient taking differently: Take 75 mg by mouth daily. ) 90 tablet 3 11/19/2018 at 0830  . famotidine (PEPCID) 20 MG tablet Take 1 tablet (20 mg total) by mouth daily. 30 tablet 1 11/18/2018 at Unknown time  . feeding supplement, ENSURE ENLIVE, (ENSURE ENLIVE) LIQD  Take 237 mLs by mouth 2 (two) times daily between meals.   unknown  . ferrous sulfate 325 (65 FE) MG tablet Take 325 mg by mouth as needed. Stopped 11/08/16   11/19/2018 at Unknown time  . fluticasone (FLONASE ALLERGY RELIEF) 50 MCG/ACT nasal spray Place 1 spray into the nose daily as needed for allergies.   unknown  . furosemide (LASIX) 20 MG tablet Take 1 tablet (20 mg total) by mouth daily. 90 tablet 3 unknown  . isosorbide mononitrate (IMDUR) 30 MG 24 hr tablet Take 1 tablet by mouth once daily 90 tablet 3 11/19/2018 at Unknown time  . loratadine (CLARITIN) 10 MG tablet Take 10 mg by mouth daily.   unknown  . Multiple Vitamins-Minerals (EMERGEN-C VITAMIN C PO) Take 1 tablet by mouth daily.   11/19/2018 at Unknown time  . nitroGLYCERIN (NITROSTAT) 0.4 MG SL tablet Place 1 tablet (0.4 mg total) under the tongue every 5 (five) minutes as needed for chest pain. 25 tablet 1 11/19/2018 at Unknown time  . Polyvinyl Alcohol-Povidone (REFRESH OP) Place 1 drop into both eyes 2 (two) times daily.    11/19/2018 at Unknown time    Assessment: 46 yoF on heparin for ACS and DVT. Cath planned for Tuesday.  Heparin level today is therapeutic at 0.48. Her H&H has trended down slightly and is now 9.9/30.0. plts are wnl. No bleeding noted per chart.   Goal of Therapy:  Heparin level 0.3-0.7 units/ml Monitor platelets by anticoagulation protocol: Yes   Plan:  -Continue heparin at 700 units/hr -Daily heparin level and CBC - monitor for s/sx of bleeding     Thank you,   Eddie Candle, PharmD PGY-1 Pharmacy Resident   Please check amion for clinical pharmacist contact number

## 2018-11-22 NOTE — H&P (View-Only) (Signed)
Progress Note  Patient Name: [name] Date of Encounter: 11/22/2018  Primary Cardiologist: Sherren Mocha, MD   Subjective   83 year old female with known coronary artery disease with failed PCI/PDA in 2016 with wire dissection, cath 2017 with moderate circumflex and LAD disease with occluded PDA, nuclear stress test 11/20/2018 with moderate area of severe inferior/inferior lateral wall ischemia with mildly elevated troponin on for cardiac catheterization Tuesday.  Inpatient Medications    Scheduled Meds: . aspirin  81 mg Oral Daily  . [START ON 11/23/2018] aspirin  81 mg Oral Pre-Cath  . clopidogrel  75 mg Oral Daily  . famotidine  20 mg Oral Daily  . ferrous sulfate  325 mg Oral Q breakfast  . isosorbide mononitrate  60 mg Oral Daily  . loratadine  10 mg Oral Daily  . metoprolol tartrate  12.5 mg Oral BID  . polyvinyl alcohol  1 drop Both Eyes BID  . regadenoson  0.4 mg Intravenous Once  . vitamin C  250 mg Oral Daily   Continuous Infusions: . [START ON 11/23/2018] sodium chloride     Followed by  . [START ON 11/23/2018] sodium chloride    . heparin 700 Units/hr (11/21/18 0541)   PRN Meds: acetaminophen, fluticasone, hydrALAZINE, nitroGLYCERIN, ondansetron (ZOFRAN) IV, oxyCODONE-acetaminophen, sodium chloride flush   Vital Signs    Vitals:   11/21/18 1053 11/21/18 1155 11/21/18 2027 11/22/18 0513  BP: 124/65 113/72 (!) 111/58 (!) 102/57  Pulse: 63 (!) 58 79 68  Resp:  20 20 15   Temp: 99.3 F (37.4 C) 98.4 F (36.9 C) 98.2 F (36.8 C) 98.4 F (36.9 C)  TempSrc: Oral Oral Oral Oral  SpO2: 99% 100% 100% 97%  Weight:    51.2 kg  Height:        Intake/Output Summary (Last 24 hours) at 11/22/2018 0856 Last data filed at 11/22/2018 0841 Gross per 24 hour  Intake 1007.3 ml  Output 800 ml  Net 207.3 ml   Last 3 Weights 11/22/2018 11/21/2018 11/20/2018  Weight (lbs) 112 lb 12.8 oz 112 lb 1.6 oz 110 lb 9.6 oz  Weight (kg) 51.166 kg 50.848 kg 50.168 kg      Telemetry     Sinus rhythm no adverse arrhythmias- Personally Reviewed  ECG    Sinus rhythm, QTC 498 prolonged- Personally Reviewed  Physical Exam   GEN: No acute distress.   Neck: No JVD, dry lips Cardiac: RRR, no murmurs, rubs, or gallops.  Respiratory: Clear to auscultation bilaterally. GI: Soft, nontender, non-distended  MS: No edema; No deformity. Neuro:  Nonfocal  Psych: Normal affect   Labs    High Sensitivity Troponin:   Recent Labs  Lab 11/19/18 1728 11/19/18 2130 11/19/18 2355 11/20/18 0651 11/20/18 0957  TROPONINIHS 85* 81* 197* 246* 237*      Chemistry Recent Labs  Lab 11/20/18 0651 11/21/18 0247 11/22/18 0326  NA 132* 132* 131*  K 4.7 3.6 3.8  CL 101 100 101  CO2 19* 21* 22  GLUCOSE 77 80 84  BUN 11 11 17   CREATININE 1.09* 1.01* 1.13*  CALCIUM 9.2 8.6* 8.9  GFRNONAA 48* 53* 46*  GFRAA 56* >60 53*  ANIONGAP 12 11 8      Hematology Recent Labs  Lab 11/19/18 1528 11/21/18 0247 11/22/18 0326  WBC 5.5 5.1 5.8  RBC 4.01 3.66* 3.55*  HGB 11.2* 10.1* 9.9*  HCT 35.3* 30.7* 30.0*  MCV 88.0 83.9 84.5  MCH 27.9 27.6 27.9  MCHC 31.7 32.9 33.0  RDW 15.4 15.1 15.1  PLT 202 207 194    BNP Recent Labs  Lab 11/19/18 2130  BNP 794.7*     DDimer  Recent Labs  Lab 11/19/18 1528  DDIMER 0.57*     Radiology    Nm Myocar Multi W/spect W/wall Motion / Ef  Result Date: 11/20/2018  There was no ST segment deviation noted during stress.  Findings consistent with ischemia.  This is an intermediate risk study.  The left ventricular ejection fraction is moderately decreased (30-44%).  Moderate area of severe ischemia in the inferior to inferior lateal wall at mid and basal level Inferior wall hypokinesis EF estimated at 39% Intermediate risk study base on size/severity of ischemia Area and depressed EF   Vas Us Lower Extremity Venous (dvt)  Result Date: 11/22/2018  Lower Venous Study Indications: Elevated D-dimer, edema.  Comparison Study: Prior negative left  LEV study done 08/22/15 is available for                   comparison Performing Technologist: Candace Kanady RVS  Examination Guidelines: A complete evaluation includes B-mode imaging, spectral Doppler, color Doppler, and power Doppler as needed of all accessible portions of each vessel. Bilateral testing is considered an integral part of a complete examination. Limited examinations for reoccurring indications may be performed as noted.  +---------+---------------+---------+-----------+----------+--------------+ RIGHT    CompressibilityPhasicitySpontaneityPropertiesThrombus Aging +---------+---------------+---------+-----------+----------+--------------+ CFV      Full           Yes      Yes                                 +---------+---------------+---------+-----------+----------+--------------+ SFJ      Full                                                        +---------+---------------+---------+-----------+----------+--------------+ FV Prox  Full                                                        +---------+---------------+---------+-----------+----------+--------------+ FV Mid   Full                                                        +---------+---------------+---------+-----------+----------+--------------+ FV DistalFull                                                        +---------+---------------+---------+-----------+----------+--------------+ PFV      Full                                                        +---------+---------------+---------+-----------+----------+--------------+   POP      Full           Yes      Yes                                 +---------+---------------+---------+-----------+----------+--------------+ PTV      Full                                                        +---------+---------------+---------+-----------+----------+--------------+ PERO     Full                                                         +---------+---------------+---------+-----------+----------+--------------+   +---------+---------------+---------+-----------+----------+--------------+ LEFT     CompressibilityPhasicitySpontaneityPropertiesThrombus Aging +---------+---------------+---------+-----------+----------+--------------+ CFV      Full           Yes      Yes                                 +---------+---------------+---------+-----------+----------+--------------+ SFJ      Full                                                        +---------+---------------+---------+-----------+----------+--------------+ FV Prox  Full                                                        +---------+---------------+---------+-----------+----------+--------------+ FV Mid   Full                                                        +---------+---------------+---------+-----------+----------+--------------+ FV DistalFull                                                        +---------+---------------+---------+-----------+----------+--------------+ PFV      Full                                                        +---------+---------------+---------+-----------+----------+--------------+ POP      Full           Yes      Yes                                 +---------+---------------+---------+-----------+----------+--------------+  PTV      Full                                                        +---------+---------------+---------+-----------+----------+--------------+ PERO     None                                         Acute          +---------+---------------+---------+-----------+----------+--------------+     Summary: Right: There is no evidence of deep vein thrombosis in the lower extremity. Left: Findings consistent with acute deep vein thrombosis involving the left peroneal veins.  *See table(s) above for measurements and observations. Electronically signed by  Coral Else MD on 11/22/2018 at 1:11:43 AM.    Final     Cardiac Studies   ECHO 04/19/2018   1. The left ventricle has normal systolic function of 55-60%. The cavity size is normal. There is normal left ventricular wall thickness. Echo evidence of pseudonormal diastolic filling patterns. Elevated left ventricular end-diastolic pressure.  2. The right ventricle is mildly enlarged in size. There is normal systolic function. Right ventricular systolic pressure is mildly elevated with an estimated pressure of 36.9 mmHg.  3. Normal left atrial size.  4. Mildly dilated right atrial size.  5. The mitral valve normal in structure. There is mild thickening and mildly calcified of the anterior leaflet. There is severe mitral annular calcification present. Regurgitation is mild by color flow Doppler.  6. Tricuspid regurgitation is mild.  7. The aortic valve tricuspid. There is severe thickening and severely calcified of the aortic valve with moderately decreased cusp excursion. Aortic valve regurgitation is mild to moderate by color flow Doppler. The jet is eccentric towards the  anterior MV leaflet. The calculated aortic valve area is 1.46 cm consistent with mild stenosis.  8. The inferior vena cava was dilated in size with <50% respiratory variablity.  9. Compared to prior echo the PASP has decreased from to .  Cardiac catheterization 05/24/2015: Diagnostic Dominance: Right  Intervention      Patient Profile     83 y.o. female with coronary artery disease as described above awaiting cardiac catheterization Tuesday.  Assessment & Plan    Non-ST elevation myocardial infarction/CAD/angina - Mildly elevated high-sensitivity troponin. -On IV heparin aspirin, Plavix (not a candidate for CABG), beta-blocker, nitrates. - Orders are in for cardiac catheterization on Tuesday. - Inferior lateral/inferior wall ischemia noted on stress test. -Note CTA for chest was negative for PE -Once  again failed right PDA PCI in 2016 and 2017.  Left arm numbness -Evaluated by neurology on 11/21/2018      For questions or updates, please contact [facility] Please consult [email or web address] for contact info under        Signed, Donato Schultz, MD  11/22/2018, 8:56 AM

## 2018-11-23 ENCOUNTER — Inpatient Hospital Stay (HOSPITAL_COMMUNITY)
Admission: EM | Disposition: A | Discharge status: Home / Self Care | Payer: Self-pay | Source: Home / Self Care | Attending: Family Medicine

## 2018-11-23 DIAGNOSIS — I5033 Acute on chronic diastolic (congestive) heart failure: Secondary | ICD-10-CM

## 2018-11-23 HISTORY — PX: LEFT HEART CATH AND CORONARY ANGIOGRAPHY: CATH118249

## 2018-11-23 HISTORY — PX: CORONARY BALLOON ANGIOPLASTY: CATH118233

## 2018-11-23 LAB — CBC
HCT: 29.4 % — ABNORMAL LOW (ref 36.0–46.0)
Hemoglobin: 9.7 g/dL — ABNORMAL LOW (ref 12.0–15.0)
MCH: 28.4 pg (ref 26.0–34.0)
MCHC: 33 g/dL (ref 30.0–36.0)
MCV: 86 fL (ref 80.0–100.0)
Platelets: 176 10*3/uL (ref 150–400)
RBC: 3.42 MIL/uL — ABNORMAL LOW (ref 3.87–5.11)
RDW: 15.4 % (ref 11.5–15.5)
WBC: 5.3 10*3/uL (ref 4.0–10.5)
nRBC: 0 % (ref 0.0–0.2)

## 2018-11-23 LAB — POCT ACTIVATED CLOTTING TIME: Activated Clotting Time: 296 seconds

## 2018-11-23 LAB — HEPARIN LEVEL (UNFRACTIONATED): Heparin Unfractionated: 0.38 IU/mL (ref 0.30–0.70)

## 2018-11-23 SURGERY — LEFT HEART CATH AND CORONARY ANGIOGRAPHY
Anesthesia: LOCAL

## 2018-11-23 MED ORDER — BIVALIRUDIN TRIFLUOROACETATE 250 MG IV SOLR
INTRAVENOUS | Status: AC
  Filled 2018-11-23: qty 250

## 2018-11-23 MED ORDER — SODIUM CHLORIDE 0.9% FLUSH: 3.0000 mL | INTRAVENOUS | Status: DC | PRN

## 2018-11-23 MED ORDER — SODIUM CHLORIDE 0.9 % IV SOLN: 250.0000 mL | INTRAVENOUS | Status: DC | PRN

## 2018-11-23 MED ORDER — VERAPAMIL HCL 2.5 MG/ML IV SOLN
INTRAVENOUS | Status: AC
  Filled 2018-11-23: qty 2

## 2018-11-23 MED ORDER — SODIUM CHLORIDE 0.9% FLUSH: 3.0000 mL | Freq: Two times a day (BID) | INTRAVENOUS | Status: DC

## 2018-11-23 MED ORDER — LIDOCAINE HCL (PF) 1 % IJ SOLN
INTRAMUSCULAR | Status: AC
  Filled 2018-11-23: qty 30

## 2018-11-23 MED ORDER — SODIUM CHLORIDE 0.9 % IV SOLN
INTRAVENOUS | Status: DC | PRN
  Administered 2018-11-23 (×2): 1.75 mg/kg/h via INTRAVENOUS

## 2018-11-23 MED ORDER — HEPARIN (PORCINE) 25000 UT/250ML-% IV SOLN
700.0000 [IU]/h | INTRAVENOUS | Status: DC
  Administered 2018-11-23: 23:00:00 700 [IU]/h via INTRAVENOUS
  Filled 2018-11-23: qty 250

## 2018-11-23 MED ORDER — ENOXAPARIN SODIUM 40 MG/0.4ML ~~LOC~~ SOLN: 40.0000 mg | SUBCUTANEOUS | Status: DC

## 2018-11-23 MED ORDER — IOHEXOL 350 MG/ML SOLN
INTRAVENOUS | Status: DC | PRN
  Administered 2018-11-23: 12:00:00 140 mL via INTRA_ARTERIAL

## 2018-11-23 MED ORDER — MIDAZOLAM HCL 2 MG/2ML IJ SOLN
INTRAMUSCULAR | Status: AC
  Filled 2018-11-23: qty 2

## 2018-11-23 MED ORDER — SODIUM CHLORIDE 0.9 % WEIGHT BASED INFUSION: 1.0000 mL/kg/h | INTRAVENOUS | Status: AC

## 2018-11-23 MED ORDER — BIVALIRUDIN BOLUS VIA INFUSION - CUPID
INTRAVENOUS | Status: DC | PRN
  Administered 2018-11-23: 11:00:00 38.925 mg via INTRAVENOUS

## 2018-11-23 MED ORDER — HEPARIN (PORCINE) IN NACL 1000-0.9 UT/500ML-% IV SOLN
INTRAVENOUS | Status: AC
  Filled 2018-11-23: qty 1000

## 2018-11-23 MED ORDER — MIDAZOLAM HCL 2 MG/2ML IJ SOLN
INTRAMUSCULAR | Status: DC | PRN
  Administered 2018-11-23: 1 mg via INTRAVENOUS
  Administered 2018-11-23: 0.5 mg via INTRAVENOUS

## 2018-11-23 MED ORDER — HEPARIN (PORCINE) 25000 UT/250ML-% IV SOLN: 700.0000 [IU]/h | INTRAVENOUS | Status: DC

## 2018-11-23 MED ORDER — LIDOCAINE HCL (PF) 1 % IJ SOLN
INTRAMUSCULAR | Status: DC | PRN
  Administered 2018-11-23: 15 mL

## 2018-11-23 MED ORDER — HEPARIN (PORCINE) IN NACL 1000-0.9 UT/500ML-% IV SOLN
INTRAVENOUS | Status: DC | PRN
  Administered 2018-11-23 (×2): 500 mL

## 2018-11-23 SURGICAL SUPPLY — 27 items
BALLN EMERGE MR PUSH 1.5X12 (BALLOONS) ×2
BALLN SPRINT LEG OTW 1.25X10 (BALLOONS) ×2
BALLOON EMERGE MR PUSH 1.5X12 (BALLOONS) IMPLANT
BALLOON SPRINT LEG OTW 1.25X10 (BALLOONS) IMPLANT
CATH INFINITI 5FR MULTPACK ANG (CATHETERS) ×1 IMPLANT
CATH TELEPORT (CATHETERS) ×1 IMPLANT
CATH TURNPIKE LP 150CM (CATHETERS) ×1 IMPLANT
CATH VISTA GUIDE 6FR JR4 (CATHETERS) ×1 IMPLANT
GLIDESHEATH SLEND SS 6F .021 (SHEATH) ×1 IMPLANT
GUIDELINER 6F (CATHETERS) ×1 IMPLANT
KIT ENCORE 26 ADVANTAGE (KITS) ×1 IMPLANT
KIT HEART LEFT (KITS) ×2 IMPLANT
PACK CARDIAC CATHETERIZATION (CUSTOM PROCEDURE TRAY) ×2 IMPLANT
SHEATH PINNACLE 5F 10CM (SHEATH) ×1 IMPLANT
SHEATH PINNACLE 6F 10CM (SHEATH) ×1 IMPLANT
SHEATH PROBE COVER 6X72 (BAG) ×1 IMPLANT
TRANSDUCER W/MONITORING KIT (MISCELLANEOUS) ×1 IMPLANT
TRANSDUCER W/STOPCOCK (MISCELLANEOUS) ×3 IMPLANT
TUBING ART PRESS 72  MALE/FEM (TUBING) ×1
TUBING ART PRESS 72 MALE/FEM (TUBING) IMPLANT
TUBING CIL FLEX 10 FLL-RA (TUBING) ×2 IMPLANT
WIRE ASAHI FIELDER XT 300CM (WIRE) ×1 IMPLANT
WIRE ASAHI PROWATER 180CM (WIRE) ×1 IMPLANT
WIRE EMERALD 3MM-J .035X150CM (WIRE) ×1 IMPLANT
WIRE GUIDE ASAHI EXTENSION 165 (WIRE) ×1 IMPLANT
WIRE HI TORQ VERSACORE-J 145CM (WIRE) ×1 IMPLANT
WIRE MAILMAN 300CM (WIRE) ×1 IMPLANT

## 2018-11-23 NOTE — Progress Notes (Signed)
ANTICOAGULATION CONSULT NOTE - [place] for heparin Indication: chest pain/ACS + DVT  Allergies  Allergen Reactions  . Ezetimibe Other (See Comments)    Muscle pain= zetia  . Ezetimibe-Simvastatin Other (See Comments)    Muscle pain= vytorin  . Morphine Other (See Comments)    nightmares  . Rosuvastatin Other (See Comments)    Muscle pain  . Ambien [Zolpidem Tartrate] Other (See Comments)    Fell down  . Plaquenil [Hydroxychloroquine Sulfate] Other (See Comments)    Doesn't recall reaction  . Polytrim [Polymyxin B-Trimethoprim] Other (See Comments)    unknown  . Prevacid [Lansoprazole] Other (See Comments)    dizziness    Patient Measurements: Height: 5\' 2"  (157.5 cm) Weight: 114 lb 6.4 oz (51.9 kg)(scale c) IBW/kg (Calculated) : 50.1 Heparin Dosing Weight: 49.9 kg  Vital Signs: Temp: 98.2 F (36.8 C) (09/08 0509) Temp Source: Oral (09/08 0509) BP: 112/63 (09/08 0509) Pulse Rate: 71 (09/08 0509)  Labs: Recent Labs    11/20/18 0957  11/21/18 0247 11/21/18 0946 11/22/18 0326 11/23/18 0534  HGB  --    < > 10.1*  --  9.9* 9.7*  HCT  --   --  30.7*  --  30.0* 29.4*  PLT  --   --  207  --  194 176  HEPARINUNFRC  --    < > 0.42 0.52 0.48 0.38  CREATININE  --   --  1.01*  --  1.13*  --   TROPONINIHS 237*  --   --   --   --   --    < > = values in this interval not displayed.    Estimated Creatinine Clearance: 31.4 mL/min (A) (by C-G formula based on SCr of 1.13 mg/dL (H)).   Medical History: Past Medical History:  Diagnosis Date  . Abnormal nuclear stress test 05/24/2015  . Atypical ductal hyperplasia of right breast 02/22/2014  . C. difficile colitis - recurrent 06/23/2016  . C. difficile diarrhea on po vanc 10/04/2015  . CAD (coronary artery disease) 2006, 2016   a. 08/2014 NSTEMI: Attempted PCI of 85% RPDA - unable to wire, complicated by dissection, case aborted;  b. 08/2014 Relook Cath: LM nl, LAD 60p, SP1 90ost (small), D1 65ost, RI  45/50, RCA nl, RPDA 90 - healed dissection;  b. 08/2014 Echo: EF 55%, mild/mon midapical inferior HK, mild AI, PASP 63mmHg. c. Cath 05/2015 with CTO of the right-PDA and unsuccessful PCI  . CAD S/P unsuccessful PCI 08/18/14 04/20/2007  . chronic back   . CKD (chronic kidney disease), stage II   . COLONIC POLYPS, ADENOMATOUS, HX OF   . Diverticulitis   . Dyslipidemia    a. statin intolerant.  [name] Dyspnea    with exertion  . Elevated troponin level 10/04/2015  . ENDOMETRIOSIS   . Essential hypertension   . Fibromyalgia   . GASTRIC ANTRAL VASCULAR ECTASIA   . GERD   . History of hiatal hernia   . Hypertension, uncontrolled 10/04/2015  . Insomnia   . IRON DEFICIENCY ANEMIA SECONDARY TO BLOOD LOSS   . Myocardial infarction ([place]) 08/2014  . Neuropathy   . NSTEMI (non-ST elevated myocardial infarction) ([place]) 08/18/2014  . Osteoarthritis    "back, hips, basically all over" (05/24/2015)  . Palpitations 03/06/2011  . Paresthesia 08/01/2014  . Raynaud's syndrome   . Rosacea, acne   . Sicca syndrome ([place])   . SJOGREN'S SYNDROME   . SUPRAVENTRICULAR TACHYCARDIA   . Vitamin D  deficiency     Medications:  Medications Prior to Admission  Medication Sig Dispense Refill Last Dose  . acetaminophen (TYLENOL) 500 MG tablet Take 1,000 mg by mouth 3 (three) times daily.   11/19/2018 at Unknown time  . amLODipine (NORVASC) 2.5 MG tablet Take 2.5 mg by mouth daily.   11/19/2018 at Unknown time  . aspirin 81 MG tablet Take 81 mg by mouth daily.   11/19/2018 at 0830  . clopidogrel (PLAVIX) 75 MG tablet TAKE 1 TABLET BY MOUTH ONCE DAILY WITH BREAKFAST (Patient taking differently: Take 75 mg by mouth daily. ) 90 tablet 3 11/19/2018 at 0830  . famotidine (PEPCID) 20 MG tablet Take 1 tablet (20 mg total) by mouth daily. 30 tablet 1 11/18/2018 at Unknown time  . feeding supplement, ENSURE ENLIVE, (ENSURE ENLIVE) LIQD Take 237 mLs by mouth 2 (two) times daily between meals.   unknown  . ferrous sulfate 325 (65 FE) MG tablet Take  325 mg by mouth as needed. Stopped 11/08/16   11/19/2018 at Unknown time  . fluticasone (FLONASE ALLERGY RELIEF) 50 MCG/ACT nasal spray Place 1 spray into the nose daily as needed for allergies.   unknown  . furosemide (LASIX) 20 MG tablet Take 1 tablet (20 mg total) by mouth daily. 90 tablet 3 unknown  . isosorbide mononitrate (IMDUR) 30 MG 24 hr tablet Take 1 tablet by mouth once daily 90 tablet 3 11/19/2018 at Unknown time  . loratadine (CLARITIN) 10 MG tablet Take 10 mg by mouth daily.   unknown  . Multiple Vitamins-Minerals (EMERGEN-C VITAMIN C PO) Take 1 tablet by mouth daily.   11/19/2018 at Unknown time  . nitroGLYCERIN (NITROSTAT) 0.4 MG SL tablet Place 1 tablet (0.4 mg total) under the tongue every 5 (five) minutes as needed for chest pain. 25 tablet 1 11/19/2018 at Unknown time  . Polyvinyl Alcohol-Povidone (REFRESH OP) Place 1 drop into both eyes 2 (two) times daily.    11/19/2018 at Unknown time    Assessment: 82 yoF on heparin for ACS and acute DVT. Cath planned for today 9/8. Heparin level remains therapeutic, CBC stable.   Goal of Therapy:  Heparin level 0.3-0.7 units/ml Monitor platelets by anticoagulation protocol: Yes   Plan:  -Continue heparin at 700 units/hr -Daily heparin level and CBC -F/U longterm OAC plam   Fredonia Highland, PharmD, BCPS Clinical Pharmacist [phone] Please check AMION for all [facility] numbers 11/23/2018

## 2018-11-23 NOTE — Progress Notes (Signed)
Pt offers not complaints; monitoring  For sheath removal. Rle Neurovascular status intact.

## 2018-11-23 NOTE — Progress Notes (Signed)
Progress Note  Patient Name: [name] Date of Encounter: 11/23/2018  Primary Cardiologist: Tonny Bollman, MD   Subjective   Post-cath. Evaluated in holding area, sheath still in place. No complaints. No CP or dyspnea at present. Filling me in on multiple significant issues in her life since I last saw her in January.   Inpatient Medications    Scheduled Meds: . [MAR Hold] aspirin  81 mg Oral Daily  . [MAR Hold] clopidogrel  75 mg Oral Daily  . [MAR Hold] famotidine  20 mg Oral Daily  . [MAR Hold] feeding supplement (ENSURE ENLIVE)  237 mL Oral BID BM  . [MAR Hold] ferrous sulfate  325 mg Oral Q breakfast  . [MAR Hold] isosorbide mononitrate  60 mg Oral Daily  . [MAR Hold] loratadine  10 mg Oral Daily  . [MAR Hold] metoprolol tartrate  12.5 mg Oral BID  . [MAR Hold] multivitamin with minerals  1 tablet Oral Daily  . [MAR Hold] polyvinyl alcohol  1 drop Both Eyes BID  . [MAR Hold] regadenoson  0.4 mg Intravenous Once  . [MAR Hold] vitamin C  250 mg Oral Daily   Continuous Infusions: . sodium chloride 1 mL/kg/hr (11/23/18 0520)  . heparin Stopped (11/23/18 1011)   PRN Meds: [MAR Hold] acetaminophen, [MAR Hold] fluticasone, [MAR Hold] hydrALAZINE, [MAR Hold] nitroGLYCERIN, [MAR Hold] ondansetron (ZOFRAN) IV, [MAR Hold] oxyCODONE-acetaminophen, sodium chloride flush   Vital Signs    Vitals:   11/23/18 1315 11/23/18 1320 11/23/18 1325 11/23/18 1400  BP: (!) 111/47 (!) 121/40 (!) 106/53 (!) 113/52  Pulse: (!) 58 62 (!) 58 (!) 59  Resp: 12 19 17 15   Temp:      TempSrc:      SpO2: 98% 98% 97% 98%  Weight:      Height:        Intake/Output Summary (Last 24 hours) at 11/23/2018 1439 Last data filed at 11/23/2018 0911 Gross per 24 hour  Intake 1132.07 ml  Output 600 ml  Net 532.07 ml   Last 3 Weights 11/23/2018 11/22/2018 11/21/2018  Weight (lbs) 114 lb 6.4 oz 112 lb 12.8 oz 112 lb 1.6 oz  Weight (kg) 51.891 kg 51.166 kg 50.848 kg      Telemetry    Sinus rhythm -  Personally Reviewed   Physical Exam  Alert, oriented woman in NAD, elderly GEN: No acute distress.   Neck: No JVD Cardiac: RRR, 2/6 SEM at the RUSB, no diastolic murmur  Respiratory: Clear to auscultation bilaterally. GI: Soft, nontender, non-distended  MS: trace bilateral pretibial edema; No deformity. Neuro:  Nonfocal  Psych: Normal affect   Labs    High Sensitivity Troponin:   Recent Labs  Lab 11/19/18 1728 11/19/18 2130 11/19/18 2355 11/20/18 0651 11/20/18 0957  TROPONINIHS 85* 81* 197* 246* 237*      Chemistry Recent Labs  Lab 11/20/18 0651 11/21/18 0247 11/22/18 0326  NA 132* 132* 131*  K 4.7 3.6 3.8  CL 101 100 101  CO2 19* 21* 22  GLUCOSE 77 80 84  BUN 11 11 17   CREATININE 1.09* 1.01* 1.13*  CALCIUM 9.2 8.6* 8.9  GFRNONAA 48* 53* 46*  GFRAA 56* >60 53*  ANIONGAP 12 11 8      Hematology Recent Labs  Lab 11/21/18 0247 11/22/18 0326 11/23/18 0534  WBC 5.1 5.8 5.3  RBC 3.66* 3.55* 3.42*  HGB 10.1* 9.9* 9.7*  HCT 30.7* 30.0* 29.4*  MCV 83.9 84.5 86.0  MCH 27.6 27.9 28.4  MCHC 32.9 33.0 33.0  RDW 15.1 15.1 15.4  PLT 207 194 176    BNP Recent Labs  Lab 11/19/18 2130  BNP 794.7*     DDimer  Recent Labs  Lab 11/19/18 1528  DDIMER 0.57*     Radiology    Mr Brain Wo Contrast  Result Date: 11/22/2018 CLINICAL DATA:  Left arm numbness EXAM: MRI HEAD WITHOUT CONTRAST TECHNIQUE: Diffusion only imaging was requested for follow-up purposes COMPARISON:  Two days ago FINDINGS: Diffusion imaging is negative for acute infarct. No hydrocephalus, collection, or mass effect. No new finding. IMPRESSION: Diffusion only brain MRI which is negative for acute infarct or other interval change. Electronically Signed   By: Marnee SpringJonathon  Watts M.D.   On: 11/22/2018 13:57    Cardiac Studies   Cardiac Cath 11/23/2018: Conclusion    Prox LAD lesion is 50% stenosed.  Dist LAD lesion is 25% stenosed.  Prox Cx to Mid Cx lesion is 50% stenosed.  Prox RCA  lesion is 25% stenosed.  RPDA lesion is 100% stenosed.  Dist RCA lesion is 25% stenosed.  RPAV lesion is 99% stenosed.  Post intervention, there is a 99% residual stenosis.  The left ventricular systolic function is normal.  LV end diastolic pressure is normal.  The left ventricular ejection fraction is 55-65% by visual estimate.   1. Single vessel obstructive CAD with CTO of the PDA with collaterals. Compared to prior study there is a new 99% stenosis in the PLOM 2. Good LV function 3. Normal LVEDP 4. Unsuccessful PCI of the PLOM do to inability to cross the lesion with a balloon despite aggressive support.   Plan: medical management.    Recommendations  Antiplatelet/Anticoag Recommend dual antiplatelet therapy with Aspirin 81mg  daily and Clopidogrel 75mg  daily long-term (beyond 12 months) because of advanced CAD.  Indications  Non-ST elevation (NSTEMI) myocardial infarction (HCC) [I21.4 (ICD-10-CM)]  Procedural Details  Technical Details Indication: 83 yo WF with NSTEMI. She has known occlusion of the PDA and 2 prior attempts at PCI of this vessel were unsuccessful  Diagnostic Procedure Details: The right radial artery was examined by US and was tiny- unsuitable for catheter placement. The right groin was prepped, draped, and anesthetized with 1% lidocaine. Ultrasound was used to guide access. Image was obtained and stored in the record. Using the modified Seldinger technique, a 5 French sheath was introduced into the right femoral artery. Standard Judkins catheters were used for selective coronary angiography and left ventriculography. Catheter exchanges were performed over a wire.  The diagnostic procedure was well-tolerated without immediate complications.  PCI Procedure Note:  Following the diagnostic procedure, the decision was made to proceed with PCI of the right  PLOM branch. This lesion was severely calcified and angulated after the PDA. The sheath was upsized to a 6  [place]. Weight-based bivalirudin was given for anticoagulation. Once a therapeutic ACT was achieved, a 6 [place] FR4 guide catheter was inserted.  We were unable to cross with a prowater wire. Given the difficult anatomy a Teleport catheter was placed and a  Fielder XT coronary guidewire was used to cross the lesion. We were unable to cross the lesion with the Teleport. We were also unable to cross the lesion with a 1.5 mm balloon. We used a Turnpike LP and were able to nose this into the lesion. The [place] XT was exchanged for a Mailman wire. A Guideliner support catheter was advanced into the distal RCA. Even with this added support we were unable to  cross the lesion with a 1.25 mm OTW balloon. I discussed the situation with Dr Katrinka BlazingSmith and Dr Excell Seltzerooper. The only other option would be to do atherectomy of this lesion but we all felt this would be risky given the acute angulation at the lesion.    Following PCI, there was persistent 99% residual stenosis and TIMI-3 flow. Femoral hemostasis will be achieved with manual compression. The patient tolerated the PCI procedure well. There were no immediate procedural complications.  The patient was transferred to the post catheterization recovery area for further monitoring.  Contrast: 140 cc      Estimated blood loss <50 mL.    Coronary Findings  Diagnostic Dominance: Right Left Anterior Descending  Prox LAD lesion 50% stenosed  Prox LAD lesion is 50% stenosed. The lesion is calcified. There is a heavily calcified lesion in the proximal LAD at the origin of the first diagonal. There is no significant change from the previous study last year. This does not appear to be flow obstructive and I would estimated at 50%. The remaining portions of the LAD are heavily calcified with mild nonobstructive disease noted.  Dist LAD lesion 25% stenosed  Dist LAD lesion is 25% stenosed. The lesion is segmental. The lesion is calcified.  Left Circumflex  Prox Cx to Mid Cx  lesion 50% stenosed  Prox Cx to Mid Cx lesion is 50% stenosed. The lesion is segmental. There is diffuse, calcified nonobstructive stenosis in the left circumflex up to 50%  Right Coronary Artery  This is a large, dominant vessel. The entire vessel is heavily calcified. There are no high-grade lesions throughout the proximal, mid, or distal RCA. The PLA branches are patent, but the PDA branch is totally occluded.  Prox RCA lesion 25% stenosed  Prox RCA lesion is 25% stenosed. The lesion is segmental. The lesion is calcified.  Dist RCA lesion 25% stenosed  Dist RCA lesion is 25% stenosed. The lesion is segmental. The lesion is calcified.  Right Posterior Descending Artery  RPDA lesion 100% stenosed  RPDA lesion is 100% stenosed. The lesion is calcified. The lesion was previously treated.  Right Posterior Atrioventricular Artery  RPAV lesion 99% stenosed  RPAV lesion is 99% stenosed. The lesion is severely calcified.  Intervention  RPAV lesion  Angioplasty  Post-Intervention Lesion Assessment  The intervention was unsuccessful due to inability to cross the lesion with balloon. Pre-interventional TIMI flow is 3. Post-intervention TIMI flow is 3. No complications occurred at this lesion.  There is a 99% residual stenosis post intervention.  Wall Motion  Resting               Left Heart  Left Ventricle The left ventricular size is normal. The left ventricular systolic function is normal. LV end diastolic pressure is normal. The left ventricular ejection fraction is 55-65% by visual estimate. No regional wall motion abnormalities.  Coronary Diagrams  Diagnostic Dominance: Right  Intervention      Patient Profile     83 y.o. female with known coronary artery disease presenting with shortness of breath and left arm numbness, noted to have mildly elevated troponin and treated for acute coronary syndrome with cardiac catheterization this morning  Assessment & Plan    1.   Non-STEMI: Status post cardiac catheterization and attempted PTCA/stenting today.  She had a new subtotal occlusion of the right posterolateral branch and the posterior AV segment.  Despite aggressive efforts at guide support and support catheters with stiff wires, unable to pass even small  balloon across the lesion.  There is a moderate amount of myocardium involved.  Recommend continued medical therapy.  No other safe PCI options available.  The area is very distal and angulated, would avoid atherectomy.  The patient is on clopidogrel and aspirin for antiplatelet therapy as well as isosorbide and metoprolol for antianginal therapy. 2.  Acute on chronic diastolic heart failure: Elevated BNP noted.  Now with normal LVEDP by cath.  We will continue with medical therapy.  Does not appear acutely volume overloaded.  She has received IV Lasix prior to my evaluation.  Disposition: Likely home tomorrow pending lab work after attempted PCI today.  Continue with medical therapy for her coronary artery disease.  For questions or updates, please contact [place] Please consult [email or web address] for contact info under    Signed, Sherren Mocha, MD  11/23/2018, 2:39 PM

## 2018-11-23 NOTE — Progress Notes (Signed)
51fr sheath aspirated and removed from rfa, manual pressure applied for 20 minutes. Site level 0, no S+S of hematoma. Tegaderm dressing applied, bedrest instructions given.  Bilateral dp and dp pulses present with doppler.  Dp pulses were very faint with doppler.   Bedrest begins at 14:45:00

## 2018-11-23 NOTE — Interval H&P Note (Signed)
History and Physical Interval Note:  11/23/2018 10:04 AM  [name]  has presented today for surgery, with the diagnosis of NSTEMI.  The various methods of treatment have been discussed with the patient and family. After consideration of risks, benefits and other options for treatment, the patient has consented to  Procedure(s): LEFT HEART CATH AND CORONARY ANGIOGRAPHY (N/A) as a surgical intervention.  The patient's history has been reviewed, patient examined, no change in status, stable for surgery.  I have reviewed the patient's chart and labs.  Questions were answered to the patient's satisfaction.     Collier [facility] 11/23/2018 10:04 AM Cath Lab Visit (complete for each Cath Lab visit)  Clinical Evaluation Leading to the Procedure:   ACS: Yes.    Non-ACS:    Anginal Classification: CCS IV  Anti-ischemic medical therapy: Maximal Therapy (2 or more classes of medications)  Non-Invasive Test Results: Intermediate-risk stress test findings: cardiac mortality 1-3%/year  Prior CABG: No previous CABG

## 2018-11-23 NOTE — Progress Notes (Signed)
  PT Cancellation Note  Patient Details Name: [name] MRN: [id number] DOB: 03/29/1935   Cancelled Treatment:    Reason Eval/Treat Not Completed: Patient at procedure or test/unavailable(leaving for cath)   Sandy Salaam Kathyleen Radice 11/23/2018, 9:42 AM  Elwyn Reach, PT Acute Rehabilitation Services Pager: [phone] Office: [phone]

## 2018-11-23 NOTE — Progress Notes (Signed)
PROGRESS NOTE    [name]  XBL:[id number] DOB: [birth year] DOA: 11/19/2018 PCP: Laurann Montana, MD    Brief Narrative:  83 year old female with history of coronary artery disease, presents to the hospital with complaints of shortness of breath and left arm numbness.  She has mildly elevated troponin.  She was seen by cardiology who performed a stress test which showed depressed EF of 39% and areas of ischemia.  Plans are for cardiac cath on 9/8.  She is anticoagulated with heparin.  For her left arm numbness, MRI imaging of brain did not show any evidence of stroke.  MRI C-spine did show degenerative changes and spinal stenosis. - previously failed failed PCI/PDA in 2016 and 2017 with wire dissection,  LHC in 2017 with moderate circumflex and LAD disease with occluded PDA, nuclear stress test 11/20/2018 with moderate area of severe inferior/inferior lateral wall ischemia with mildly elevated troponin -LHC showed- 1. Single vessel obstructive CAD with CTO of the PDA with collaterals. Compared to prior study there is a new 99% stenosis in the PLOM 2. Good LV function 3. Normal LVEDP 4. Unsuccessful PCI of the PLOM do to inability to cross the lesion with a balloon despite aggressive support.  -Medical management advised   Assessment & Plan:   Principal Problem:   SOB (shortness of breath) Active Problems:   Dyslipidemia-statin and Zetia intol   Iron deficiency anemia   CAD S/P unsuccessful PCI 08/18/14   GERD   Non-ST elevation (NSTEMI) myocardial infarction [facility])   Essential hypertension   CKD (chronic kidney disease), stage II   Elevated troponin   Hyponatremia   Hyperkalemia   Chronic diastolic (congestive) heart failure (HCC)   Left arm numbness   Leg DVT (deep venous thromboembolism), acute, left (HCC)   1)NSTEMI -She has known history of coronary artery disease.  Cardiology following.  She had Myoview on 9/5 that showed moderate area of severe inferior/inferolateral wall  ischemia and decline in EF to 39% EF by gated images was normal on echo from 04/2018.  [name]  She is continued on aspirin and Plavix, beta-blockers and nitrates.  She is anticoagulated with intravenous heparin. -LHC showed- 1. Single vessel obstructive CAD with CTO of the PDA with collaterals. Compared to prior study there is a new 99% stenosis in the PLOM 2. Good LV function 3. Normal LVEDP 4. Unsuccessful PCI of the PLOM do to inability to cross the lesion with a balloon despite aggressive support.  -Medical management advised   2)Acute left lower extremity DVT.  Noted on venous Dopplers.  Patient had elevated d-dimer on admission.  CT angiogram of the chest was negative for PE.  Continue on intravenous heparin.   3)Acute on chronic diastolic congestive heart failure.  She received a dose of intravenous Lasix .  -Close to being euvolemic on Lasix at this time   4)Left sided numbness.  Patient initially presented with left arm numbness, but feels that this has progressed to her entire left side.  Initial MRI of the brain did not show any evidence of stroke.  MRI of the C-spine showed cervical disc disease and spinal stenosis.  Neurology input appreciated, neurology signed off as of 11/22/2018   5)Hyponatremia.  Asymptomatic.  Stable.  6)Chronic iron deficiency anemia.  Continue oral iron.   DVT prophylaxis: heparin infusion Code Status: full code Family Communication: Previously discussed with with son, [name] over the phone Disposition Plan: discharge home after cardiac catheterization if stable   Consultants:  Cardiology  Neurology  Procedures:   [facility] 11/23/2018 LHC showed- 1. Single vessel obstructive CAD with CTO of the PDA with collaterals. Compared to prior study there is a new 99% stenosis in the PLOM 2. Good LV function 3. Normal LVEDP 4. Unsuccessful PCI of the PLOM do to inability to cross the lesion with a balloon despite aggressive support.  -Medical management  advised    Antimicrobials:       Subjective:  --Resting comfortably, no chest pains no shortness of breath, eagerly awaiting.. LHC   Objective: Vitals:   11/23/18 1320 11/23/18 1325 11/23/18 1400 11/23/18 1518  BP: (!) 121/40 (!) 106/53 (!) 113/52 111/88  Pulse: 62 (!) 58 (!) 59 67  Resp: 19 17 15    Temp:    98.2 F (36.8 C)  TempSrc:    Oral  SpO2: 98% 97% 98% 100%  Weight:      Height:        Intake/Output Summary (Last 24 hours) at 11/23/2018 1727 Last data filed at 11/23/2018 0911 Gross per 24 hour  Intake 822.27 ml  Output 400 ml  Net 422.27 ml   Filed Weights   11/21/18 0625 11/22/18 0513 11/23/18 0509  Weight: 50.8 kg 51.2 kg 51.9 kg    Examination:  General exam: Appears calm and comfortable  Respiratory system: Clear to auscultation. Respiratory effort normal. Cardiovascular system: S1 & S2 heard, RRR. No JVD, murmurs, rubs, gallops or clicks. No pedal edema. Gastrointestinal system: Abdomen is nondistended, soft and nontender. No organomegaly or masses felt. Normal bowel sounds heard. Central nervous system: Alert and oriented. Strength is equal bilaterally. Extremities: Symmetric 5 x 5 power. Skin: No rashes, lesions or ulcers Psychiatry: Judgement and insight appear normal. Mood & affect appropriate.    Data Reviewed:   CBC: Recent Labs  Lab 11/19/18 1528 11/21/18 0247 11/22/18 0326 11/23/18 0534  WBC 5.5 5.1 5.8 5.3  HGB 11.2* 10.1* 9.9* 9.7*  HCT 35.3* 30.7* 30.0* 29.4*  MCV 88.0 83.9 84.5 86.0  PLT 202 207 194 176   Basic Metabolic Panel: Recent Labs  Lab 11/19/18 1528 11/20/18 0651 11/21/18 0247 11/22/18 0326  NA 129* 132* 132* 131*  K 5.4* 4.7 3.6 3.8  CL 99 101 100 101  CO2 21* 19* 21* 22  GLUCOSE 92 77 80 84  BUN 11 11 11 17   CREATININE 1.09* 1.09* 1.01* 1.13*  CALCIUM 9.6 9.2 8.6* 8.9   GFR: Estimated Creatinine Clearance: 31.4 mL/min (A) (by C-G formula based on SCr of 1.13 mg/dL (H)). Liver Function Tests: No  results for input(s): AST, ALT, ALKPHOS, BILITOT, PROT, ALBUMIN in the last 168 hours. No results for input(s): LIPASE, AMYLASE in the last 168 hours. No results for input(s): AMMONIA in the last 168 hours. Coagulation Profile: No results for input(s): INR, PROTIME in the last 168 hours. Cardiac Enzymes: No results for input(s): CKTOTAL, CKMB, CKMBINDEX, TROPONINI in the last 168 hours. BNP (last 3 results) Recent Labs    04/14/18 1517  PROBNP 982*   HbA1C: No results for input(s): HGBA1C in the last 72 hours. CBG: No results for input(s): GLUCAP in the last 168 hours. Lipid Profile: No results for input(s): CHOL, HDL, LDLCALC, TRIG, CHOLHDL, LDLDIRECT in the last 72 hours. Thyroid Function Tests: No results for input(s): TSH, T4TOTAL, FREET4, T3FREE, THYROIDAB in the last 72 hours. Anemia Panel: No results for input(s): VITAMINB12, FOLATE, FERRITIN, TIBC, IRON, RETICCTPCT in the last 72 hours. Sepsis Labs: No results for input(s): PROCALCITON, LATICACIDVEN in  the last 168 hours.  Recent Results (from the past 240 hour(s))  SARS CORONAVIRUS 2 (TAT 6-24 HRS) Nasopharyngeal Nasopharyngeal Swab     Status: None   Collection Time: 11/19/18  5:40 PM   Specimen: Nasopharyngeal Swab  Result Value Ref Range Status   SARS Coronavirus 2 NEGATIVE NEGATIVE Final    Comment: (NOTE) SARS-CoV-2 target nucleic acids are NOT DETECTED. The SARS-CoV-2 RNA is generally detectable in upper and lower respiratory specimens during the acute phase of infection. Negative results do not preclude SARS-CoV-2 infection, do not rule out co-infections with other pathogens, and should not be used as the sole basis for treatment or other patient management decisions. Negative results must be combined with clinical observations, patient history, and epidemiological information. The expected result is Negative. Fact Sheet for Patients: SugarRoll.be Fact Sheet for Healthcare  Providers: https://www.woods-mathews.com/ This test is not yet approved or cleared by the [place] FDA and  has been authorized for detection and/or diagnosis of SARS-CoV-2 by FDA under an Emergency Use Authorization (EUA). This EUA will remain  in effect (meaning this test can be used) for the duration of the COVID-19 declaration under Section 56 4(b)(1) of the Act, 21 U.S.C. section 360bbb-3(b)(1), unless the authorization is terminated or revoked sooner. Performed at [facility], [address] [place], [place] [address]          Radiology Studies: Mr Brain Wo Contrast  Result Date: 11/22/2018 CLINICAL DATA:  Left arm numbness EXAM: MRI HEAD WITHOUT CONTRAST TECHNIQUE: Diffusion only imaging was requested for follow-up purposes COMPARISON:  Two days ago FINDINGS: Diffusion imaging is negative for acute infarct. No hydrocephalus, collection, or mass effect. No new finding. IMPRESSION: Diffusion only brain MRI which is negative for acute infarct or other interval change. Electronically Signed   By: Monte Fantasia M.D.   On: 11/22/2018 13:57        Scheduled Meds:  aspirin  81 mg Oral Daily   clopidogrel  75 mg Oral Daily   famotidine  20 mg Oral Daily   feeding supplement (ENSURE ENLIVE)  237 mL Oral BID BM   ferrous sulfate  325 mg Oral Q breakfast   isosorbide mononitrate  60 mg Oral Daily   loratadine  10 mg Oral Daily   metoprolol tartrate  12.5 mg Oral BID   multivitamin with minerals  1 tablet Oral Daily   polyvinyl alcohol  1 drop Both Eyes BID   regadenoson  0.4 mg Intravenous Once   sodium chloride flush  3 mL Intravenous Q12H   vitamin C  250 mg Oral Daily   Continuous Infusions:  sodium chloride     sodium chloride 1 mL/kg/hr (11/23/18 1518)   heparin       LOS: 3 days   Roxan Hockey, MD [facility]   If 7PM-7AM, please contact night-coverage [email or web address]  11/23/2018, 5:27 PM

## 2018-11-23 NOTE — Progress Notes (Signed)
ANTICOAGULATION CONSULT NOTE - [place] for heparin Indication:  DVT  Allergies  Allergen Reactions  . Ezetimibe Other (See Comments)    Muscle pain= zetia  . Ezetimibe-Simvastatin Other (See Comments)    Muscle pain= vytorin  . Morphine Other (See Comments)    nightmares  . Rosuvastatin Other (See Comments)    Muscle pain  . Ambien [Zolpidem Tartrate] Other (See Comments)    Fell down  . Plaquenil [Hydroxychloroquine Sulfate] Other (See Comments)    Doesn't recall reaction  . Polytrim [Polymyxin B-Trimethoprim] Other (See Comments)    unknown  . Prevacid [Lansoprazole] Other (See Comments)    dizziness    Patient Measurements: Height: 5\' 2"  (157.5 cm) Weight: 114 lb 6.4 oz (51.9 kg)(scale c) IBW/kg (Calculated) : 50.1 Heparin Dosing Weight: 49.9 kg  Vital Signs: Temp: 98.2 F (36.8 C) (09/08 1518) Temp Source: Oral (09/08 1518) BP: 111/88 (09/08 1518) Pulse Rate: 67 (09/08 1518)  Labs: Recent Labs    11/21/18 0247 11/21/18 0946 11/22/18 0326 11/23/18 0534  HGB 10.1*  --  9.9* 9.7*  HCT 30.7*  --  30.0* 29.4*  PLT 207  --  194 176  HEPARINUNFRC 0.42 0.52 0.48 0.38  CREATININE 1.01*  --  1.13*  --     Estimated Creatinine Clearance: 31.4 mL/min (A) (by C-G formula based on SCr of 1.13 mg/dL (H)).   Medical History: Past Medical History:  Diagnosis Date  . Abnormal nuclear stress test 05/24/2015  . Atypical ductal hyperplasia of right breast 02/22/2014  . C. difficile colitis - recurrent 06/23/2016  . C. difficile diarrhea on po vanc 10/04/2015  . CAD (coronary artery disease) 2006, 2016   a. 08/2014 NSTEMI: Attempted PCI of 85% RPDA - unable to wire, complicated by dissection, case aborted;  b. 08/2014 Relook Cath: LM nl, LAD 60p, SP1 90ost (small), D1 65ost, RI 45/50, RCA nl, RPDA 90 - healed dissection;  b. 08/2014 Echo: EF 55%, mild/mon midapical inferior HK, mild AI, PASP 3mmHg. c. Cath 05/2015 with CTO of the right-PDA and  unsuccessful PCI  . CAD S/P unsuccessful PCI 08/18/14 04/20/2007  . chronic back   . CKD (chronic kidney disease), stage II   . COLONIC POLYPS, ADENOMATOUS, HX OF   . Diverticulitis   . Dyslipidemia    a. statin intolerant.  [name] Dyspnea    with exertion  . Elevated troponin level 10/04/2015  . ENDOMETRIOSIS   . Essential hypertension   . Fibromyalgia   . GASTRIC ANTRAL VASCULAR ECTASIA   . GERD   . History of hiatal hernia   . Hypertension, uncontrolled 10/04/2015  . Insomnia   . IRON DEFICIENCY ANEMIA SECONDARY TO BLOOD LOSS   . Myocardial infarction ([place]) 08/2014  . Neuropathy   . NSTEMI (non-ST elevated myocardial infarction) ([place]) 08/18/2014  . Osteoarthritis    "back, hips, basically all over" (05/24/2015)  . Palpitations 03/06/2011  . Paresthesia 08/01/2014  . Raynaud's syndrome   . Rosacea, acne   . Sicca syndrome ([place])   . SJOGREN'S SYNDROME   . SUPRAVENTRICULAR TACHYCARDIA   . Vitamin D deficiency     Medications:  Medications Prior to Admission  Medication Sig Dispense Refill Last Dose  . acetaminophen (TYLENOL) 500 MG tablet Take 1,000 mg by mouth 3 (three) times daily.   11/19/2018 at Unknown time  . amLODipine (NORVASC) 2.5 MG tablet Take 2.5 mg by mouth daily.   11/19/2018 at Unknown time  . aspirin 81 MG tablet Take  81 mg by mouth daily.   11/19/2018 at 0830  . clopidogrel (PLAVIX) 75 MG tablet TAKE 1 TABLET BY MOUTH ONCE DAILY WITH BREAKFAST (Patient taking differently: Take 75 mg by mouth daily. ) 90 tablet 3 11/19/2018 at 0830  . famotidine (PEPCID) 20 MG tablet Take 1 tablet (20 mg total) by mouth daily. 30 tablet 1 11/18/2018 at Unknown time  . feeding supplement, ENSURE ENLIVE, (ENSURE ENLIVE) LIQD Take 237 mLs by mouth 2 (two) times daily between meals.   unknown  . ferrous sulfate 325 (65 FE) MG tablet Take 325 mg by mouth as needed. Stopped 11/08/16   11/19/2018 at Unknown time  . fluticasone (FLONASE ALLERGY RELIEF) 50 MCG/ACT nasal spray Place 1 spray into the nose daily  as needed for allergies.   unknown  . furosemide (LASIX) 20 MG tablet Take 1 tablet (20 mg total) by mouth daily. 90 tablet 3 unknown  . isosorbide mononitrate (IMDUR) 30 MG 24 hr tablet Take 1 tablet by mouth once daily 90 tablet 3 11/19/2018 at Unknown time  . loratadine (CLARITIN) 10 MG tablet Take 10 mg by mouth daily.   unknown  . Multiple Vitamins-Minerals (EMERGEN-C VITAMIN C PO) Take 1 tablet by mouth daily.   11/19/2018 at Unknown time  . nitroGLYCERIN (NITROSTAT) 0.4 MG SL tablet Place 1 tablet (0.4 mg total) under the tongue every 5 (five) minutes as needed for chest pain. 25 tablet 1 11/19/2018 at Unknown time  . Polyvinyl Alcohol-Povidone (REFRESH OP) Place 1 drop into both eyes 2 (two) times daily.    11/19/2018 at Unknown time    Assessment: 82 yoF on heparin for ACS and acute DVT. She is s/p cath with single vessel CAD and unsuccessful PCI. Plans are to restart heparin in 8 hours post sheath and start oral anticoagulation on 9/9. -prior to cath heparin level= 0.38 on 700 units/hr (sheath out at 2:45)   Goal of Therapy:  Heparin level 0.3-0.7 units/ml Monitor platelets by anticoagulation protocol: Yes   Plan:  -Resume heparin at 700 units/hr 8 hours post sheath (~ 11pm) -Daily heparin level and CBC -Likely DOAC on 9/9  Harland German, PharmD Clinical Pharmacist **Pharmacist phone directory can now be found on [email or web address] ([email or web address]).  Listed under [facility].

## 2018-11-24 ENCOUNTER — Encounter (HOSPITAL_COMMUNITY): Payer: Self-pay | Admitting: Cardiology

## 2018-11-24 LAB — CBC
HCT: 28.9 % — ABNORMAL LOW (ref 36.0–46.0)
Hemoglobin: 9.3 g/dL — ABNORMAL LOW (ref 12.0–15.0)
MCH: 28.4 pg (ref 26.0–34.0)
MCHC: 32.2 g/dL (ref 30.0–36.0)
MCV: 88.1 fL (ref 80.0–100.0)
Platelets: 176 10*3/uL (ref 150–400)
RBC: 3.28 MIL/uL — ABNORMAL LOW (ref 3.87–5.11)
RDW: 15.8 % — ABNORMAL HIGH (ref 11.5–15.5)
WBC: 5.5 10*3/uL (ref 4.0–10.5)
nRBC: 0 % (ref 0.0–0.2)

## 2018-11-24 LAB — BASIC METABOLIC PANEL
Anion gap: 11 (ref 5–15)
BUN: 18 mg/dL (ref 8–23)
CO2: 20 mmol/L — ABNORMAL LOW (ref 22–32)
Calcium: 9.2 mg/dL (ref 8.9–10.3)
Chloride: 103 mmol/L (ref 98–111)
Creatinine, Ser: 0.98 mg/dL (ref 0.44–1.00)
GFR calc Af Amer: >60 mL/min (ref >60)
GFR calc non Af Amer: 54 mL/min — ABNORMAL LOW (ref >60)
Glucose, Bld: 81 mg/dL (ref 70–99)
Potassium: 4.8 mmol/L (ref 3.5–5.1)
Sodium: 134 mmol/L — ABNORMAL LOW (ref 135–145)

## 2018-11-24 LAB — HEPARIN LEVEL (UNFRACTIONATED): Heparin Unfractionated: 0.28 IU/mL — ABNORMAL LOW (ref 0.30–0.70)

## 2018-11-24 MED ORDER — ROSUVASTATIN CALCIUM 5 MG PO TABS: 10.0000 mg | ORAL_TABLET | Freq: Every day | ORAL | Status: DC

## 2018-11-24 MED ORDER — ELIQUIS 5 MG VTE STARTER PACK: ORAL_TABLET | ORAL | 0 refills | Status: DC

## 2018-11-24 MED ORDER — APIXABAN 5 MG PO TABS
10.0000 mg | ORAL_TABLET | Freq: Two times a day (BID) | ORAL | Status: DC
  Administered 2018-11-24: 10 mg via ORAL
  Filled 2018-11-24: qty 2

## 2018-11-24 MED ORDER — ISOSORBIDE MONONITRATE ER 60 MG PO TB24: 60.0000 mg | ORAL_TABLET | Freq: Every day | ORAL | 2 refills | Status: DC

## 2018-11-24 MED ORDER — APIXABAN 5 MG PO TABS: 5.0000 mg | ORAL_TABLET | Freq: Two times a day (BID) | ORAL | 5 refills | Status: DC

## 2018-11-24 MED ORDER — APIXABAN 5 MG PO TABS: 5.0000 mg | ORAL_TABLET | Freq: Two times a day (BID) | ORAL | Status: DC

## 2018-11-24 MED ORDER — CLOPIDOGREL BISULFATE 75 MG PO TABS: 75.0000 mg | ORAL_TABLET | Freq: Every day | ORAL | 3 refills | Status: DC

## 2018-11-24 MED ORDER — ROSUVASTATIN CALCIUM 10 MG PO TABS: 10.0000 mg | ORAL_TABLET | Freq: Every evening | ORAL | 2 refills | Status: DC

## 2018-11-24 MED ORDER — PANTOPRAZOLE SODIUM 40 MG PO TBEC: 40.0000 mg | DELAYED_RELEASE_TABLET | Freq: Every day | ORAL | Status: DC

## 2018-11-24 MED ORDER — PANTOPRAZOLE SODIUM 40 MG PO TBEC: 40.0000 mg | DELAYED_RELEASE_TABLET | Freq: Every day | ORAL | 2 refills | Status: DC

## 2018-11-24 MED ORDER — METOPROLOL TARTRATE 25 MG PO TABS: 12.5000 mg | ORAL_TABLET | Freq: Two times a day (BID) | ORAL | 2 refills | Status: DC

## 2018-11-24 MED FILL — Verapamil HCl IV Soln 2.5 MG/ML: INTRAVENOUS | Qty: 2 | Status: AC

## 2018-11-24 MED FILL — ISOSORBIDE MN ER 60 MG TAB: 60 | 30 days supply | Qty: 30 | Fill #0

## 2018-11-24 MED FILL — ELIQUIS STARTER PACK 5 MG T: 5 | 30 days supply | Qty: 74 | Fill #0

## 2018-11-24 MED FILL — METOPROLOL TARTRATE 25 MG T: 25 | 30 days supply | Qty: 30 | Fill #0

## 2018-11-24 MED FILL — ROSUVASTATIN CALCIUM 10 MG: 10 | 30 days supply | Qty: 30 | Fill #0

## 2018-11-24 MED FILL — PANTOPRAZOLE SOD DR 40 MG T: 40 | 30 days supply | Qty: 30 | Fill #0

## 2018-11-24 MED FILL — ELIQUIS 5 MG TABLET: 5 | 30 days supply | Qty: 60 | Fill #0

## 2018-11-24 MED FILL — CLOPIDOGREL 75 MG TABLET: 75 | 30 days supply | Qty: 30 | Fill #0

## 2018-11-24 NOTE — Progress Notes (Addendum)
Progress Note  Patient Name: [name] LimaBetty S [name] Date of Encounter: 11/24/2018  Primary Cardiologist: Tonny BollmanMichael Yomar Mejorado, MD   Subjective   No complaints this morning. Sitting up in bed eating breakfast.   Inpatient Medications    Scheduled Meds: . clopidogrel  75 mg Oral Daily  . feeding supplement (ENSURE ENLIVE)  237 mL Oral BID BM  . ferrous sulfate  325 mg Oral Q breakfast  . isosorbide mononitrate  60 mg Oral Daily  . loratadine  10 mg Oral Daily  . metoprolol tartrate  12.5 mg Oral BID  . multivitamin with minerals  1 tablet Oral Daily  . pantoprazole  40 mg Oral QHS  . polyvinyl alcohol  1 drop Both Eyes BID  . regadenoson  0.4 mg Intravenous Once  . sodium chloride flush  3 mL Intravenous Q12H  . vitamin C  250 mg Oral Daily   Continuous Infusions: . sodium chloride    . heparin 700 Units/hr (11/23/18 2310)   PRN Meds: sodium chloride, acetaminophen, fluticasone, hydrALAZINE, nitroGLYCERIN, ondansetron (ZOFRAN) IV, oxyCODONE-acetaminophen, sodium chloride flush   Vital Signs    Vitals:   11/23/18 1736 11/23/18 2100 11/24/18 0539 11/24/18 0942  BP: 125/65 (!) 107/51 122/76 124/60  Pulse:  74 67   Resp:  16 16   Temp:  98.3 F (36.8 C) 98.5 F (36.9 C)   TempSrc:  Oral Oral   SpO2:  98% 98%   Weight:      Height:        Intake/Output Summary (Last 24 hours) at 11/24/2018 0947 Last data filed at 11/24/2018 0500 Gross per 24 hour  Intake 592.85 ml  Output -  Net 592.85 ml   Last 3 Weights 11/23/2018 11/22/2018 11/21/2018  Weight (lbs) 114 lb 6.4 oz 112 lb 12.8 oz 112 lb 1.6 oz  Weight (kg) 51.891 kg 51.166 kg 50.848 kg      Telemetry    SR - Personally Reviewed  ECG    SR with nonspecific ST changes - Personally Reviewed  Physical Exam  Pleasant older WF, sitting up in bed eating.  GEN: No acute distress.   Neck: No JVD Cardiac: RRR, 3/6 systolic murmur, no rubs, or gallops.  Respiratory: Clear to auscultation bilaterally. GI: Soft, nontender,  non-distended  MS: No edema; No deformity. Right femoral cath site stable.  Neuro:  Nonfocal  Psych: Normal affect   Labs    High Sensitivity Troponin:   Recent Labs  Lab 11/19/18 1728 11/19/18 2130 11/19/18 2355 11/20/18 0651 11/20/18 0957  TROPONINIHS 85* 81* 197* 246* 237*      Chemistry Recent Labs  Lab 11/21/18 0247 11/22/18 0326 11/24/18 0408  NA 132* 131* 134*  K 3.6 3.8 4.8  CL 100 101 103  CO2 21* 22 20*  GLUCOSE 80 84 81  BUN 11 17 18   CREATININE 1.01* 1.13* 0.98  CALCIUM 8.6* 8.9 9.2  GFRNONAA 53* 46* 54*  GFRAA >60 53* >60  ANIONGAP 11 8 11      Hematology Recent Labs  Lab 11/22/18 0326 11/23/18 0534 11/24/18 0408  WBC 5.8 5.3 5.5  RBC 3.55* 3.42* 3.28*  HGB 9.9* 9.7* 9.3*  HCT 30.0* 29.4* 28.9*  MCV 84.5 86.0 88.1  MCH 27.9 28.4 28.4  MCHC 33.0 33.0 32.2  RDW 15.1 15.4 15.8*  PLT 194 176 176    BNP Recent Labs  Lab 11/19/18 2130  BNP 794.7*     DDimer  Recent Labs  Lab 11/19/18 1528  DDIMER  0.57*     Radiology    Mr Brain Wo Contrast  Result Date: 11/22/2018 CLINICAL DATA:  Left arm numbness EXAM: MRI HEAD WITHOUT CONTRAST TECHNIQUE: Diffusion only imaging was requested for follow-up purposes COMPARISON:  Two days ago FINDINGS: Diffusion imaging is negative for acute infarct. No hydrocephalus, collection, or mass effect. No new finding. IMPRESSION: Diffusion only brain MRI which is negative for acute infarct or other interval change. Electronically Signed   By: Marnee Spring M.D.   On: 11/22/2018 13:57    Cardiac Studies   Cath: 11/23/18   Prox LAD lesion is 50% stenosed.  Dist LAD lesion is 25% stenosed.  Prox Cx to Mid Cx lesion is 50% stenosed.  Prox RCA lesion is 25% stenosed.  RPDA lesion is 100% stenosed.  Dist RCA lesion is 25% stenosed.  RPAV lesion is 99% stenosed.  Post intervention, there is a 99% residual stenosis.  The left ventricular systolic function is normal.  LV end diastolic pressure is  normal.  The left ventricular ejection fraction is 55-65% by visual estimate.   1. Single vessel obstructive CAD with CTO of the PDA with collaterals. Compared to prior study there is a new 99% stenosis in the PLOM 2. Good LV function 3. Normal LVEDP 4. Unsuccessful PCI of the PLOM do to inability to cross the lesion with a balloon despite aggressive support.   Plan: medical management.   Diagnostic Dominance: Right  Intervention     Patient Profile     83 y.o. female with known coronary artery disease presenting with shortness of breath and left arm numbness, noted to have mildly elevated troponin and treated for acute coronary syndrome with cardiac catheterization yesterday.   Assessment & Plan    1. NSTEMI: HsT peaked at 246. Underwent cath yesterday with attempted PTCA/stenting to PLOM. She had a new subtotal occlusion of the right posterolateral branch and the posterior AV segment. Attempted efforts with guide support and support catheters with stiff wires, unable to pass even small balloon across the lesion. Recommend continued medical therapy.  No other safe PCI options available. Will continue on plavix, stop ASA with the need for DOAC with newly dx DVT.   2.  Acute on chronic diastolic heart failure: Elevated BNP noted.  Now with normal LVEDP by cath.  Given IV lasix yesterday. No volume overload on exam today. On BB therapy  3. Acute LLE DVT: has been on IV heparin. From cardiology stand point, ok to transition to DOAC today.   4. HL: notes indicate issues with statin, Zetia in the past. She does not recall an intolerance. Discussed with her and agreeable to retrial low dose Crestor.   5. Anemia: Hgb 9.3 today. As above, stop ASA with the need for DOAC and increased risk of bleeding.   Will arrange outpt follow up.  For questions or updates, please contact [facility] Please consult [email or web address] for contact info under        Signed, Laverda Page, NP   11/24/2018, 9:47 AM    Patient seen, examined. Available data reviewed. Agree with findings, assessment, and plan as outlined by Laverda Page, NP.  On my exam, the patient is alert, oriented in no distress.  JVP is normal, lungs are clear, heart is regular rate and rhythm with a 2/6 systolic murmur at the right upper sternal border.  There are no diastolic murmurs.  Abdomen is soft and nontender.  The right groin site is clear.  There is no pretibial  edema.  The patient has been transitioned from IV heparin to a direct oral anticoagulant drug per the primary service.  Aspirin has been discontinued.  She should remain on clopidogrel as tolerated.  I reviewed the results of her cardiac catheterization and attempted PCI with her.  Her coronary anatomy is very challenging with angulation and calcification of the distal RCA branches.  Medical therapy is recommended.  She has had no recurrence of angina overnight.  She is stable for hospital discharge with outpatient cardiology follow-up.  Sherren Mocha, M.D. 11/24/2018 2:59 PM

## 2018-11-24 NOTE — Care Management Important Message (Signed)
Important Message  Patient Details  Name: KIMLEY APSEY MRN: [id number] Date of Birth: Mar 23, 1935   Medicare Important Message Given:  Yes     Shelda Altes 11/24/2018, 12:15 PM

## 2018-11-24 NOTE — Discharge Summary (Signed)
[name], is a 83 y.o. female  DOB 1935/09/20  MRN [id number].  Admission date:  11/19/2018  Admitting Physician  Lorretta Harp, MD  Discharge Date:  11/24/2018   Primary MD  Laurann Montana, MD  Recommendations for primary care physician for things to follow:   1)Very low-salt diet advised 2)Weigh yourself daily, call if you gain more than 3 pounds in 1 day or more than 5 pounds in 1 week as your diuretic medications may need to be adjusted 3)Limit your Fluid  intake to no more than 60 ounces (1.8 Liters) per day 4) you are taking Eliquis/apixaban and Plavix which are blood thinners so Avoid ibuprofen/Advil/Aleve/Motrin/Goody Powders/Naproxen/BC powders/Meloxicam/Diclofenac/Indomethacin and other Nonsteroidal anti-inflammatory medications as these will make you more likely to bleed and can cause stomach ulcers, can also cause Kidney problems.  5) please follow-up with Dr. Excell Seltzer your general cardiologist as advised by the cardiology team 6) you need repeat CBC/complete blood count to primary care physician within a week  Admission Diagnosis  fall   Discharge Diagnosis  fall    Principal Problem:   SOB (shortness of breath) Active Problems:   Dyslipidemia-statin and Zetia intol   Iron deficiency anemia   CAD S/P unsuccessful PCI 08/18/14   GERD   Non-ST elevation (NSTEMI) myocardial infarction [facility])   Essential hypertension   CKD (chronic kidney disease), stage II   Elevated troponin   Hyponatremia   Hyperkalemia   Chronic diastolic (congestive) heart failure (HCC)   Left arm numbness   Leg DVT (deep venous thromboembolism), acute, left (HCC)     Past Medical History:  Diagnosis Date   Abnormal nuclear stress test 05/24/2015   Atypical ductal hyperplasia of right breast 02/22/2014   C. difficile colitis - recurrent 06/23/2016   C. difficile diarrhea on po vanc 10/04/2015   CAD (coronary artery  disease) 2006, 2016   a. 08/2014 NSTEMI: Attempted PCI of 85% RPDA - unable to wire, complicated by dissection, case aborted;  b. 08/2014 Relook Cath: LM nl, LAD 60p, SP1 90ost (small), D1 65ost, RI 45/50, RCA nl, RPDA 90 - healed dissection;  b. 08/2014 Echo: EF 55%, mild/mon midapical inferior HK, mild AI, PASP . c. Cath 05/2015 with CTO of the right-PDA and unsuccessful PCI   CAD S/P unsuccessful PCI 08/18/14 04/20/2007   chronic back    CKD (chronic kidney disease), stage II    COLONIC POLYPS, ADENOMATOUS, HX OF    Diverticulitis    Dyslipidemia    a. statin intolerant.   Dyspnea    with exertion   Elevated troponin level 10/04/2015   ENDOMETRIOSIS    Essential hypertension    Fibromyalgia    GASTRIC ANTRAL VASCULAR ECTASIA    GERD    History of hiatal hernia    Hypertension, uncontrolled 10/04/2015   Insomnia    IRON DEFICIENCY ANEMIA SECONDARY TO BLOOD LOSS    Myocardial infarction [facility]) 08/2014   Neuropathy    NSTEMI (non-ST elevated myocardial infarction) (HCC) 08/18/2014   Osteoarthritis    "  back, hips, basically all over" (05/24/2015)   Palpitations 03/06/2011   Paresthesia 08/01/2014   Raynaud's syndrome    Rosacea, acne    Sicca syndrome (HCC)    SJOGREN'S SYNDROME    SUPRAVENTRICULAR TACHYCARDIA    Vitamin D deficiency     Past Surgical History:  Procedure Laterality Date   ABDOMINAL HYSTERECTOMY     left right ovary   APPENDECTOMY     BREAST EXCISIONAL BIOPSY Right    BREAST EXCISIONAL BIOPSY Right    BREAST LUMPECTOMY WITH RADIOACTIVE SEED LOCALIZATION Right 01/24/2014   Procedure: BREAST LUMPECTOMY WITH RADIOACTIVE SEED LOCALIZATION;  Surgeon: Avel Peaceodd Rosenbower, MD;  Location: [facility];  Service: General;  Laterality: Right;   CARDIAC CATHETERIZATION N/A 08/18/2014   Procedure: Left Heart Cath and Coronary Angiography;  Surgeon: Marykay Lexavid W Harding, MD;  [facility];  Service: Cardiovascular;   Laterality: N/A;   CARDIAC CATHETERIZATION  08/18/2014   Procedure: Coronary Balloon Angioplasty;  Surgeon: Marykay Lexavid W Harding, MD;  [facility];  Service: Cardiovascular;;   CARDIAC CATHETERIZATION N/A 08/21/2014   Procedure: Left Heart Cath and Coronary Angiography;  Surgeon: Marykay Lexavid W Harding, MD;  [facility];  Service: Cardiovascular;  Laterality: N/A;   CARDIAC CATHETERIZATION  2006   CARDIAC CATHETERIZATION N/A 05/24/2015   Procedure: Left Heart Cath and Coronary Angiography;  Surgeon: Tonny BollmanMichael Cooper, MD;  [facility];  Service: Cardiovascular;  Laterality: N/A;   COLONOSCOPY W/ BIOPSIES AND POLYPECTOMY  12/17/2006   adenomatous polyps, external hemorrhoids   CORONARY BALLOON ANGIOPLASTY N/A 11/23/2018   Procedure: CORONARY BALLOON ANGIOPLASTY;  Surgeon: SwazilandJordan, Peter M, MD;  [facility];  Service: Cardiovascular;  Laterality: N/A;   ESOPHAGOGASTRODUODENOSCOPY  11/27/2009   AP[facility] tretment of lesions   EYE SURGERY     HERNIA REPAIR     HIATAL HERNIA REPAIR  2005   and paraesophageal   LAPAROSCOPIC CHOLECYSTECTOMY     LEFT HEART CATH AND CORONARY ANGIOGRAPHY N/A 11/23/2018   Procedure: LEFT HEART CATH AND CORONARY ANGIOGRAPHY;  Surgeon: SwazilandJordan, Peter M, MD;  [facility];  Service: Cardiovascular;  Laterality: N/A;   TEAR DUCT PROBING Left    "no plugs or anything"     HPI  from the history and physical done on the day of admission:    -PCP: Laurann MontanaWhite, Cynthia, MD   Patient coming from:  The patient is coming from home.  At baseline, pt is independent for most of ADL.        Chief Complaint: SOB, left arm numbness, weakness   HPI: [name] LimaBetty S [name] is a 83 y.o. female with medical history significant of hypertension, hyperlipidemia, GERD, SVT, Sjogren's disease, dCHF, CAD, iron deficiency anemia, CKD 2, right bundle blockage, who presents with shortness of breath, left arm numbness and  weakness  Patient states that she has chronic mild shortness of breath, which has worsened today.  She does not have cough, chest pain, fever or chills.  No tenderness in the calf areas.  She also reports left arm numbness and weakness which started earlier today.  Initially states that she also has pain in left arm, but then she states that she is not very sure whether she has left arm pain or not.  Patient states that she has mild intermittent diarrhea, one episode today.  No nausea vomiting or abdominal pain.  No symptoms of UTI.  No facial droop or slurred speech.  ED Course: pt was found to have troponin 80, 85, positive d-dimer 0.57, pending COVID-19 test, potassium 5.4, sodium 129, renal function close to baseline, temperature normal, blood pressure 152/86, heart rate 69, oxygen saturation 99% on room air.  Chest x-ray negative.  Patient is placed on telemetry bed for observation.     Hospital Course:    Brief Narrative:  83 year old female with history of coronary artery disease, presents to the hospital with complaints of shortness of breath and left arm numbness.  She has mildly elevated troponin.  She was seen by cardiology who performed a stress test which showed depressed EF of 39% and areas of ischemia.  Plans are for cardiac cath on 9/8.  She is anticoagulated with heparin.  For her left arm numbness, MRI imaging of brain did not show any evidence of stroke.  MRI C-spine did show degenerative changes and spinal stenosis. - previously failed failed PCI/PDA in 2016 and 2017 with wire dissection,  LHC in 2017 with moderate circumflex and LAD disease with occluded PDA, nuclear stress test 11/20/2018 with moderate area of severe inferior/inferior lateral wall ischemia with mildly elevated troponin -LHC showed- 1. Single vessel obstructive CAD with CTO of the PDA with collaterals. Compared to prior study there is a new 99% stenosis in the PLOM 2. Good LV function 3. Normal LVEDP 4.  Unsuccessful PCI of the PLOM do to inability to cross the lesion with a balloon despite aggressive support.  -Medical management advised   Assessment & Plan:   Principal Problem:   SOB (shortness of breath) Active Problems:   Dyslipidemia-statin and Zetia intol   Iron deficiency anemia   CAD S/P unsuccessful PCI 08/18/14   GERD   Non-ST elevation (NSTEMI) myocardial infarction [facility])   Essential hypertension   CKD (chronic kidney disease), stage II   Elevated troponin   Hyponatremia   Hyperkalemia   Chronic diastolic (congestive) heart failure (HCC)   Left arm numbness   Leg DVT (deep venous thromboembolism), acute, left (HCC)   1)NSTEMI -She has known history of coronary artery disease.  Cardiology following.  She had Myoview on 9/5 that showed moderate area of severe inferior/inferolateral wall ischemia and decline in EF to 39% EF by gated images was normal on echo from 04/2018.  [name]  She is continued on aspirin and Plavix, beta-blockers and nitrates.  She is anticoagulated with intravenous heparin. -LHC showed- 1. Single vessel obstructive CAD with CTO of the PDA with collaterals. Compared to prior study there is a new 99% stenosis in the PLOM 2. Good LV function 3. Normal LVEDP 4. Unsuccessful PCI of the PLOM do to inability to cross the lesion with a balloon despite aggressive support.  -Medical management advised -Cardiology recommends Plavix without aspirin since patient is also on Eliquis -Trial of Crestor -Metoprolol 12.5 mg twice daily   2)Acute left lower extremity DVT.  Noted on venous Dopplers.  Patient had elevated d-dimer on admission.  CT angiogram of the chest was negative for PE.  Patient was treated with intravenous heparin. -= Discharge on Eliquis, avoid NSAIDs   3)Acute on chronic diastolic congestive heart failure.    Clinically appears euvolemic on Lasix at this time  -Continue Lasix  4)Left sided numbness.  Patient initially presented with left arm  numbness, but feels that this has progressed to her entire left side.  Initial MRI of the brain did not show any evidence of stroke.  MRI of the C-spine showed cervical disc disease and spinal stenosis.  Neurology input appreciated, neurology signed off as of 11/22/2018   5)Hyponatremia. -Sodium is 134, Asymptomatic.  Stable.  6)Chronic iron deficiency anemia.  Continue oral iron, hemoglobin is 9.3 after cardiac cath baseline hemoglobin usually around 10  Code Status: full code Family Communication: Previously discussed with with son, Dr. Abel Prestoom Heyboer over the phone Disposition Plan: discharge home    Consultants:   Cardiology  Neurology  Procedures:   [facility] showed- 1. Single vessel obstructive CAD with CTO of the PDA with collaterals. Compared to prior study there is a new 99% stenosis in the PLOM 2. Good LV function 3. Normal LVEDP 4. Unsuccessful PCI of the PLOM do to inability to cross the lesion with a balloon despite aggressive support.  -Medical management advised  Discharge Condition: Stable  Follow UP--outpatient follow-up with cardiology as advised  Diet and Activity recommendation:  As advised  Discharge Instructions    Discharge Instructions    Call MD for:  difficulty breathing, headache or visual disturbances   Complete by: As directed    Call MD for:  persistant dizziness or light-headedness   Complete by: As directed    Call MD for:  persistant nausea and vomiting   Complete by: As directed    Call MD for:  severe uncontrolled pain   Complete by: As directed    Call MD for:  temperature >100.4   Complete by: As directed    Diet - low sodium heart healthy   Complete by: As directed    Discharge instructions   Complete by: As directed    1)Very low-salt diet advised 2)Weigh yourself daily, call if you gain more than 3 pounds in 1 day or more than 5 pounds in 1 week as your diuretic medications may need to be adjusted 3)Limit your Fluid   intake to no more than 60 ounces (1.8 Liters) per day 4) you are taking Eliquis/apixaban and Plavix which are blood thinners so Avoid ibuprofen/Advil/Aleve/Motrin/Goody Powders/Naproxen/BC powders/Meloxicam/Diclofenac/Indomethacin and other Nonsteroidal anti-inflammatory medications as these will make you more likely to bleed and can cause stomach ulcers, can also cause Kidney problems.  5) please follow-up with Dr. Excell Seltzerooper your general cardiologist as advised by the cardiology team 6) you need repeat CBC/complete blood count to primary care physician within a week   Increase activity slowly   Complete by: As directed         Discharge Medications     Allergies as of 11/24/2018      Reactions   Ezetimibe Other (See Comments)   Muscle pain= zetia   Ezetimibe-simvastatin Other (See Comments)   Muscle pain= vytorin   Morphine Other (See Comments)   nightmares   Rosuvastatin Other (See Comments)   Muscle pain   Ambien [zolpidem Tartrate] Other (See Comments)   Fell down   Plaquenil [hydroxychloroquine Sulfate] Other (See Comments)   Doesn't recall reaction   Polytrim [polymyxin B-trimethoprim] Other (See Comments)   unknown   Prevacid [lansoprazole] Other (See Comments)   dizziness      Medication List    STOP taking these medications   amLODipine 2.5 MG tablet Commonly known as: NORVASC   aspirin 81 MG tablet   ferrous sulfate 325 (65 FE) MG tablet     TAKE these medications   acetaminophen 500 MG tablet Commonly known as: TYLENOL Take 1,000 mg by mouth 3 (three) times daily.   clopidogrel 75 MG tablet Commonly known as: PLAVIX Take 1 tablet (75 mg total) by mouth daily.  What changed: See the new instructions.   Eliquis DVT/PE Starter Pack 5 MG Tabs Take as directed on package: start with two-5mg  tablets twice daily for 7 days. On day 8, switch to one-5mg  tablet twice daily.   apixaban 5 MG Tabs tablet Commonly known as: Eliquis Take 1 tablet (5 mg total) by mouth  2 (two) times daily. Start 12/22/2018 after completing Eliquis/apixaban starter pack Start taking on: December 22, 2018   EMERGEN-C VITAMIN C PO Take 1 tablet by mouth daily.   famotidine 20 MG tablet Commonly known as: PEPCID Take 1 tablet (20 mg total) by mouth daily.   feeding supplement (ENSURE ENLIVE) Liqd Take 237 mLs by mouth 2 (two) times daily between meals.   Flonase Allergy Relief 50 MCG/ACT nasal spray Generic drug: fluticasone Place 1 spray into the nose daily as needed for allergies.   furosemide 20 MG tablet Commonly known as: LASIX Take 1 tablet (20 mg total) by mouth daily.   isosorbide mononitrate 60 MG 24 hr tablet Commonly known as: IMDUR Take 1 tablet (60 mg total) by mouth daily. Start taking on: November 25, 2018 What changed:   medication strength  how much to take   loratadine 10 MG tablet Commonly known as: CLARITIN Take 10 mg by mouth daily.   metoprolol tartrate 25 MG tablet Commonly known as: LOPRESSOR Take 0.5 tablets (12.5 mg total) by mouth 2 (two) times daily.   nitroGLYCERIN 0.4 MG SL tablet Commonly known as: NITROSTAT Place 1 tablet (0.4 mg total) under the tongue every 5 (five) minutes as needed for chest pain.   pantoprazole 40 MG tablet Commonly known as: PROTONIX Take 1 tablet (40 mg total) by mouth at bedtime.   REFRESH OP Place 1 drop into both eyes 2 (two) times daily.   rosuvastatin 10 MG tablet Commonly known as: CRESTOR Take 1 tablet (10 mg total) by mouth every evening.       Major procedures and Radiology Reports - PLEASE review detailed and final reports for all details, in brief -   Ct Angio Chest Pe W Or Wo Contrast  Result Date: 11/19/2018 CLINICAL DATA:  Shortness of breath for 1 year EXAM: CT ANGIOGRAPHY CHEST WITH CONTRAST TECHNIQUE: Multidetector CT imaging of the chest was performed using the standard protocol during bolus administration of intravenous contrast. Multiplanar CT image reconstructions and  MIPs were obtained to evaluate the vascular anatomy. CONTRAST:  OMNIPAQUE IOHEXOL 350 MG/ML SOLN COMPARISON:  CT chest February 17, 2017 FINDINGS: Cardiovascular: There is a optimal opacification of the pulmonary arteries. There is no central,segmental, or subsegmental filling defects within the pulmonary arteries. There is mild cardiomegaly. No evidence of right ventricular heart strain. There is mitral and aortic valve calcifications. Aortic atherosclerotic calcifications are seen as on the prior exam. Calcifications seen at the origin of the great vessels. There is normal three-vessel brachiocephalic anatomy without proximal stenosis. There is an ectatic ascending thoracic aorta as on prior. Mediastinum/Nodes: No hilar, mediastinal, or axillary adenopathy. Thyroid gland, trachea, and esophagus demonstrate no significant findings. Lungs/Pleura: There is a mild amount of bibasilar dependent atelectasis. No large airspace consolidation or pleural effusion. Upper Abdomen: No acute abnormalities present in the visualized portions of the upper abdomen. Musculoskeletal: No chest wall abnormality. There is worsening compression deformity of the T6 vertebral body with 80% loss of vertebral body height, progressed from the prior radiograph of September 18, 2017. There is unchanged slight superior compression deformities of T11 and T12 with approximately 25% loss  in height. Review of the MIP images confirms the above findings. IMPRESSION: 1. No central, segmental, or subsegmental pulmonary embolism. 2. Mild cardiomegaly 3.  Aortic Atherosclerosis (ICD10-I70.0). 4. Worsening in compression deformity of the T6 vertebral body with 80% loss of vertebral body height compared to prior radiograph of September 18, 2017. Electronically Signed   By: Jonna Clark M.D.   On: 11/19/2018 20:49   Mr Brain Wo Contrast  Result Date: 11/22/2018 CLINICAL DATA:  Left arm numbness EXAM: MRI HEAD WITHOUT CONTRAST TECHNIQUE: Diffusion only imaging  was requested for follow-up purposes COMPARISON:  Two days ago FINDINGS: Diffusion imaging is negative for acute infarct. No hydrocephalus, collection, or mass effect. No new finding. IMPRESSION: Diffusion only brain MRI which is negative for acute infarct or other interval change. Electronically Signed   By: Marnee Spring M.D.   On: 11/22/2018 13:57   Mr Brain Wo Contrast  Result Date: 11/20/2018 CLINICAL DATA:  Initial evaluation for focal neural deficit, stroke suspected. EXAM: MRI HEAD WITHOUT CONTRAST TECHNIQUE: Multiplanar, multiecho pulse sequences of the brain and surrounding structures were obtained without intravenous contrast. COMPARISON:  Prior MRI from 01/30/2017. FINDINGS: Brain: Mild diffuse prominence of the CSF containing spaces compatible with generalized age-related cerebral atrophy. Patchy and confluent T2/FLAIR hyperintensity within the periventricular deep white matter both cerebral hemispheres most consistent with chronic small vessel ischemic disease, moderate nature. Superimposed small remote cortical infarct present at the right parietal lobe. Few small remote bilateral cerebellar infarcts. Tiny remote lacunar infarcts noted within the thalami. No abnormal foci of restricted diffusion to suggest acute or subacute ischemia. Gray-white matter differentiation maintained. No other areas of remote cortical infarction. No foci of susceptibility artifact to suggest acute or chronic intracranial hemorrhage. No mass lesion, midline shift or mass effect. No hydrocephalus. No extra-axial fluid collection. Pituitary gland suprasellar region normal. Midline structures intact. Vascular: Major intracranial vascular flow voids maintained. Skull and upper cervical spine: Craniocervical junction within normal limits. Prominent degenerative spondylolysis noted at C5-6 without significant stenosis. Bone marrow signal intensity normal. No scalp soft tissue abnormality. Sinuses/Orbits: Patient status post  ocular lens replacement on the right. Globes and orbital soft tissues demonstrate no acute finding. Paranasal sinuses are clear. No mastoid effusion. Inner ear structures normal. Other: None. IMPRESSION: 1. No acute intracranial abnormality. 2. Age-related cerebral atrophy with moderate chronic microvascular ischemic disease, with superimposed remote infarcts involving the right parietal lobe, bilateral thalami, and cerebellum. Electronically Signed   By: Rise Mu M.D.   On: 11/20/2018 03:26   Mr Cervical Spine Wo Contrast  Result Date: 11/20/2018 CLINICAL DATA:  Initial evaluation for radiculopathy. Left arm numbness. EXAM: MRI CERVICAL SPINE WITHOUT CONTRAST TECHNIQUE: Multiplanar, multisequence MR imaging of the cervical spine was performed. No intravenous contrast was administered. COMPARISON:  Prior radiograph from 08/19/2017 FINDINGS: Alignment: Mild exaggeration of the normal cervical lordosis. Trace grade 1 anterolisthesis of C6 on C7, C7 on T1 and T1 on T2, chronic and facet mediated. Vertebrae: Vertebral body height maintained without evidence for acute or chronic fracture. Bone marrow signal intensity within normal limits. No discrete or worrisome osseous lesions. Prominent reactive endplate changes noted about the C5-6 interspace. No other abnormal marrow edema. Cord: Signal intensity within the cervical spinal cord is normal. Posterior Fossa, vertebral arteries, paraspinal tissues: Visualized brain and posterior fossa within normal limits. Craniocervical junction normal. Paraspinous and prevertebral soft tissues within normal limits. Normal intravascular flow voids seen within the vertebral arteries bilaterally. Disc levels: C2-C3: Chronic intervertebral disc space narrowing  without significant disc bulge. No significant canal or foraminal stenosis. C3-C4: Chronic intervertebral disc space narrowing. Left paracentral disc osteophyte indents the ventral thecal sac, contacting the left  ventral cord. Mild spinal stenosis without cord deformity. Left C3-4 facet is ankylosed. No significant foraminal narrowing. C4-C5: Chronic intervertebral disc space narrowing with diffuse degenerative disc osteophyte, asymmetric to the left. Flattening and partial effacement of the ventral thecal sac with resultant mild spinal stenosis. No cord deformity. Left greater than right facet hypertrophy. Moderate left worse than right C5 foraminal narrowing. C5-C6: Chronic intervertebral disc space narrowing with diffuse degenerative disc osteophyte. Associated prominent reactive endplate changes. Associated broad posterior component partially effaces the ventral thecal sac, contacting the ventral spinal cord. Mild spinal stenosis without cord deformity. Moderate bilateral C6 foraminal narrowing. C6-C7: Trace anterolisthesis. Chronic intervertebral disc space narrowing with mild disc bulge. Right worse than left facet hypertrophy. No significant spinal stenosis. Foramina remain patent. C7-T1: Mild anterolisthesis. Associated broad posterior pseudo disc bulge/uncovering. Bilateral facet hypertrophy. No canal or foraminal stenosis. Visualized upper thoracic spine demonstrates no significant finding. IMPRESSION: 1. Left paracentral disc osteophyte at C3-4, contacting the left ventral spinal cord and resulting in mild spinal stenosis. The ventral left C4 nerve root could be affected. 2. Degenerative disc osteophyte with facet degeneration at C4-5 and C5-6 with resultant mild canal with moderate bilateral C5 and C6 foraminal stenosis, worse on the left. Either of the C5 or C6 nerve roots could be affected. 3. Prominent discogenic reactive endplate changes about the C5-6 interspace, which could contribute to underlying neck pain. Electronically Signed   By: Rise MuBenjamin  McClintock M.D.   On: 11/20/2018 04:55   Nm Myocar Multi W/spect W/wall Motion / Ef  Result Date: 11/20/2018  There was no ST segment deviation noted during  stress.  Findings consistent with ischemia.  This is an intermediate risk study.  The left ventricular ejection fraction is moderately decreased (30-44%).  Moderate area of severe ischemia in the inferior to inferior lateal wall at mid and basal level Inferior wall hypokinesis EF estimated at 39% Intermediate risk study base on size/severity of ischemia Area and depressed EF   Dg Chest Port 1 View  Result Date: 11/19/2018 CLINICAL DATA:  Pt complains of SOB x1030yr but states that the reason she came in what because her family "made her" because of her left arm numbness 2-3 days ago EXAM: PORTABLE CHEST 1 VIEW COMPARISON:  02/17/2017 FINDINGS: Heart size is normal. Stable mild elevation of the RIGHT hemidiaphragm. The lungs are free of focal consolidations and pleural effusions. No pulmonary edema. Visualized osseous structures have a normal appearance. IMPRESSION: No active disease. Electronically Signed   By: Norva PavlovElizabeth  Brown M.D.   On: 11/19/2018 15:45   Vas [place] Lower Extremity Venous (dvt)  Result Date: 11/22/2018  Lower Venous Study Indications: Elevated D-dimer, edema.  Comparison Study: Prior negative left LEV study done 08/22/15 is available for                   comparison Performing Technologist: Sherren Kernsandace Kanady RVS  Examination Guidelines: A complete evaluation includes B-mode imaging, spectral Doppler, color Doppler, and power Doppler as needed of all accessible portions of each vessel. Bilateral testing is considered an integral part of a complete examination. Limited examinations for reoccurring indications may be performed as noted.  +---------+---------------+---------+-----------+----------+--------------+  RIGHT     Compressibility Phasicity Spontaneity Properties Thrombus Aging  +---------+---------------+---------+-----------+----------+--------------+  CFV       Full  Yes       Yes                                     +---------+---------------+---------+-----------+----------+--------------+  SFJ       Full                                                             +---------+---------------+---------+-----------+----------+--------------+  FV Prox   Full                                                             +---------+---------------+---------+-----------+----------+--------------+  FV Mid    Full                                                             +---------+---------------+---------+-----------+----------+--------------+  FV Distal Full                                                             +---------+---------------+---------+-----------+----------+--------------+  PFV       Full                                                             +---------+---------------+---------+-----------+----------+--------------+  POP       Full            Yes       Yes                                    +---------+---------------+---------+-----------+----------+--------------+  PTV       Full                                                             +---------+---------------+---------+-----------+----------+--------------+  PERO      Full                                                             +---------+---------------+---------+-----------+----------+--------------+   +---------+---------------+---------+-----------+----------+--------------+  LEFT      Compressibility Phasicity  Spontaneity Properties Thrombus Aging  +---------+---------------+---------+-----------+----------+--------------+  CFV       Full            Yes       Yes                                    +---------+---------------+---------+-----------+----------+--------------+  SFJ       Full                                                             +---------+---------------+---------+-----------+----------+--------------+  FV Prox   Full                                                              +---------+---------------+---------+-----------+----------+--------------+  FV Mid    Full                                                             +---------+---------------+---------+-----------+----------+--------------+  FV Distal Full                                                             +---------+---------------+---------+-----------+----------+--------------+  PFV       Full                                                             +---------+---------------+---------+-----------+----------+--------------+  POP       Full            Yes       Yes                                    +---------+---------------+---------+-----------+----------+--------------+  PTV       Full                                                             +---------+---------------+---------+-----------+----------+--------------+  PERO      None                                             Acute           +---------+---------------+---------+-----------+----------+--------------+  Summary: Right: There is no evidence of deep vein thrombosis in the lower extremity. Left: Findings consistent with acute deep vein thrombosis involving the left peroneal veins.  *See table(s) above for measurements and observations. Electronically signed by Coral Else MD on 11/22/2018 at 1:11:43 AM.    Final    Micro Results   Recent Results (from the past 240 hour(s))  SARS CORONAVIRUS 2 (TAT 6-24 HRS) Nasopharyngeal Nasopharyngeal Swab     Status: None   Collection Time: 11/19/18  5:40 PM   Specimen: Nasopharyngeal Swab  Result Value Ref Range Status   SARS Coronavirus 2 NEGATIVE NEGATIVE Final    Comment: (NOTE) SARS-CoV-2 target nucleic acids are NOT DETECTED. The SARS-CoV-2 RNA is generally detectable in upper and lower respiratory specimens during the acute phase of infection. Negative results do not preclude SARS-CoV-2 infection, do not rule out co-infections with other pathogens, and should not be used as the sole  basis for treatment or other patient management decisions. Negative results must be combined with clinical observations, patient history, and epidemiological information. The expected result is Negative. Fact Sheet for Patients: HairSlick.no Fact Sheet for Healthcare Providers: quierodirigir.com This test is not yet approved or cleared by the [place] FDA and  has been authorized for detection and/or diagnosis of SARS-CoV-2 by FDA under an Emergency Use Authorization (EUA). This EUA will remain  in effect (meaning this test can be used) for the duration of the COVID-19 declaration under Section 56 4(b)(1) of the Act, 21 U.S.C. section 360bbb-3(b)(1), unless the authorization is terminated or revoked sooner. Performed at [facility], [address], [place], [place] [address]        Today   Subjective    [name] today has no new complaints -Chest pain-free, no dyspnea -Eating okay -Eager to go home          Patient has been seen and examined prior to discharge   Objective   Blood pressure 124/60, pulse 67, temperature 98.5 F (36.9 C), temperature source Oral, resp. rate 16, height  (1.575 m), weight 51.9 kg, SpO2 98 %.   Intake/Output Summary (Last 24 hours) at 11/24/2018 1012 Last data filed at 11/24/2018 0500 Gross per 24 hour  Intake 592.85 ml  Output --  Net 592.85 ml    Exam Gen:- Awake Alert, no acute distress  HEENT:- [place].AT, No sclera icterus Neck-Supple Neck,No JVD,.  Lungs-  CTAB , good air movement bilaterally  CV- S1, S2 normal, regular Abd-  +ve B.Sounds, Abd Soft, No tenderness,    Extremity/Skin:- No  edema,   good pulses Psych-affect is appropriate, oriented x3 Neuro-subjective numbness on left upper extremity, no new focal deficits, no tremors    Data Review   CBC w Diff:  Lab Results  Component Value Date   WBC 5.5 11/24/2018   HGB 9.3 (L) 11/24/2018   HGB 10.6  (L) 04/14/2018   HGB 11.8 02/22/2014   HCT 28.9 (L) 11/24/2018   HCT 33.2 (L) 04/14/2018   HCT 38.3 02/22/2014   PLT 176 11/24/2018   PLT 193 04/14/2018   LYMPHOPCT 21 01/30/2017   LYMPHOPCT 22.7 02/22/2014   MONOPCT 8 01/30/2017   MONOPCT 11.8 02/22/2014   EOSPCT 0 01/30/2017   EOSPCT 3.3 02/22/2014   BASOPCT 0 01/30/2017   BASOPCT 0.6 02/22/2014    CMP:  Lab Results  Component Value Date   NA 134 (L) 11/24/2018   NA 133 (L) 04/14/2018   NA 139 02/22/2014  K 4.8 11/24/2018   K 4.1 02/22/2014   CL 103 11/24/2018   CO2 20 (L) 11/24/2018   CO2 24 02/22/2014   BUN 18 11/24/2018   BUN 10 04/14/2018   BUN 18.1 02/22/2014   CREATININE 0.98 11/24/2018   CREATININE 1.0 02/22/2014   PROT 6.5 04/14/2018   PROT 6.6 02/22/2014   ALBUMIN 3.9 04/14/2018   ALBUMIN 3.9 02/22/2014   BILITOT 0.6 04/14/2018   BILITOT 0.40 02/22/2014   ALKPHOS 50 04/14/2018   ALKPHOS 62 02/22/2014   AST 17 04/14/2018   AST 21 02/22/2014   ALT 6 04/14/2018   ALT 15 02/22/2014  .   Total Discharge time is about 33 minutes  Shon Hale M.D on 11/24/2018 at 10:12 AM  Go to [email or web address] -  for contact info  [facility] - Office  [phone]

## 2018-11-24 NOTE — Progress Notes (Signed)
Came to ambulate. Pt sts she still feels numb on entire left side, "like it is lazy". Able to move appropriately in bed. She wants to walk after breakfast. I will f/u later. She also has PT today. Yves Dill CES, ACSM 8:38 AM 11/24/2018

## 2018-11-24 NOTE — Progress Notes (Signed)
Pt just walked with PT, apparently tolerated well. She sts she has a cane at home if she needs it. Discussed MI, restrictions, walking as tolerated for exercise, NTG, and CRPII. Will refer to [facility]. She understands the importance of Plavix. [phone] [place], ACSM 12:01 PM 11/24/2018

## 2018-11-24 NOTE — Progress Notes (Signed)
Physical Therapy Treatment Patient Details Name: [name] MRN: [id number] DOB: 24-Jun-1935 Today's Date: 11/24/2018    History of Present Illness 83 yo admitted with SOB, CP, and LUE with NSTEMI vs demand ischemia secondary to acute on chronic diastolic CHF. PMhx: CAD, CHF, anemia, HTN    PT Comments    Pt is looking forward to d/c'ing home today and is willing to ambulate with therapy. Pt is mod I for bed mobility and transfers and min guard for ambulation of 400 feet. Pt with mild instability, but no overt LoB. Pt educated on need for mobility every hour to build her endurance. PT continues to feel pt does not need additional services at discharge.     Follow Up Recommendations  No PT follow up     Equipment Recommendations  None recommended by PT    Recommendations for Other Services       Precautions / Restrictions Precautions Precautions: Fall Restrictions Weight Bearing Restrictions: No    Mobility  Bed Mobility Overal bed mobility: Modified Independent                Transfers Overall transfer level: Modified independent                  Ambulation/Gait Ambulation/Gait assistance: Min guard Gait Distance (Feet): 400 Feet Assistive device: None Gait Pattern/deviations: Step-through pattern;Decreased stride length Gait velocity: slowed Gait velocity interpretation: <1.8 ft/sec, indicate of risk for recurrent falls General Gait Details: min guard for safety, pt reports use of cane at baseline and that she feels more comfortable to walk with someone, slow, mildly unsteady gait with no overt LoB       Balance Overall balance assessment: Mild deficits observed, not formally tested                                          Cognition Arousal/Alertness: Awake/alert Behavior During Therapy: WFL for tasks assessed/performed Overall Cognitive Status: Within Functional Limits for tasks assessed                                         Exercises      General Comments General comments (skin integrity, edema, etc.): VSS, 2/4 DoE at end of ambulation       Pertinent Vitals/Pain Pain Assessment: No/denies pain       Prior Function            PT Goals (current goals can now be found in the care plan section) Acute Rehab PT Goals Patient Stated Goal: return home PT Goal Formulation: With patient Time For Goal Achievement: 12/05/18 Potential to Achieve Goals: Good Progress towards PT goals: Progressing toward goals    Frequency    Min 3X/week      PT Plan Current plan remains appropriate       AM-PAC PT "6 Clicks" Mobility   Outcome Measure  Help needed turning from your back to your side while in a flat bed without using bedrails?: None Help needed moving from lying on your back to sitting on the side of a flat bed without using bedrails?: None Help needed moving to and from a bed to a chair (including a wheelchair)?: None Help needed standing up from a chair using your arms (e.g., wheelchair or bedside chair)?: None  Help needed to walk in hospital room?: A Little Help needed climbing 3-5 steps with a railing? : A Little 6 Click Score: 22    End of Session Equipment Utilized During Treatment: Gait belt Activity Tolerance: Patient tolerated treatment well Patient left: in chair;with call bell/phone within reach;with nursing/sitter in room Nurse Communication: Mobility status(report of numbness progressing from LUE to also include LLE) PT Visit Diagnosis: Other abnormalities of gait and mobility (R26.89)     Time: 1123-1140 PT Time Calculation (min) (ACUTE ONLY): 17 min  Charges:  $Gait Training: 8-22 mins                     Brooks Stotz B. Migdalia Dk PT, DPT Acute Rehabilitation Services Pager [phone] Office [phone]    [place] 11/24/2018, 12:53 PM

## 2018-11-24 NOTE — Plan of Care (Signed)
  Problem: Acute Rehab PT Goals(only PT should resolve) Goal: Patient Will Transfer Sit To/From Stand Outcome: Adequate for Discharge Goal: Pt Will Ambulate Outcome: Adequate for Discharge Goal: Pt Will Go Up/Down Stairs Outcome: Adequate for Discharge   

## 2018-11-29 ENCOUNTER — Encounter: Payer: Self-pay | Admitting: Cardiovascular Disease

## 2018-11-30 ENCOUNTER — Telehealth (HOSPITAL_COMMUNITY): Payer: Self-pay

## 2018-11-30 NOTE — Telephone Encounter (Signed)
Called patient to see if she is interested in the Cardiac Rehab Program. Patient expressed interest. Explained scheduling process and went over insurance, patient verbalized understanding. Will contact patient for scheduling once f/u has been completed. 

## 2018-11-30 NOTE — Telephone Encounter (Signed)
Pt insurance is active and benefits verified through [facility]. Co-pay $20.00, DED $0.00/$0.00 met, out of pocket $3,600.00/$392.71 met, co-insurance 0%. No pre-authorization required. Passport, 11/30/2018 @ 1208PM, XAJ#[id number]  Will contact patient to see if she is interested in the Cardiac Rehab Program. If interested, patient will need to complete follow up appt. Once completed, patient will be contacted for scheduling upon review by the RN Navigator.

## 2018-12-05 NOTE — Progress Notes (Signed)
Cardiology Office Note    Date:  12/06/2018   ID:  [name] LimaBetty S [name], DOB 12/05/1935, MRN [id number]  PCP:  Laurann MontanaWhite, Cynthia, MD  Cardiologist:  Dr. Excell Seltzerooper   Chief Complaint: Hospital follow up   History of Present Illness:   [name] is a 83 y.o. female with hx of CAD, chronic diastolic CHF, HLD seen for hospital follow up.  In June 2016 she had an NSTEMI treated with an attempted PCI of a right PDA.  This was complicated by wire dissection and the case was aborted.  Relook catheterization showed normal left main, 60% proximal LAD, 90% septal perforator, 65% diagonal, 50% ramus intermedius, normal RCA with a 90% right PDA and heel dissection.  Her ejection fraction was 55%.  Catheterization was done again in March 2017 with an attempted PCI which was unsuccessful to the PDA.  She has had problems over the years with chest pain and shortness of breath.   Admitted 11/2018 with NSTEM and acute LLE DVT.HsT peaked at 246. [facility]went [facility] She had a new subtotal occlusion of the right posterolateral branch and the posterior AV segment.Attempted efforts with guide support and support catheters with stiff wires, unable to pass even small balloon across the lesion.Recommend continued medical therapy. No other safe PCI options available. Will continue on plavix, stoped ASA with the need for DOAC with newly dx DVT. Patient initially presented with left arm numbness, but feels that this has progressed to her entire left side. Initial MRI of the brain did not show any evidence of stroke. MRI of the C-spine showed cervical disc disease and spinal stenosis.   Here today for follow up by herself.  Son is a Insurance account managerneurologist in [place] area.  She continues to have left-sided numbness.  His son does not think she had a stroke and she already ruled out in the hospital.  She denies chest pain, palpitation, orthopnea, PND, syncope, or melena.   Past Medical History:  Diagnosis  Date  . Abnormal nuclear stress test 05/24/2015  . Atypical ductal hyperplasia of right breast 02/22/2014  . C. difficile colitis - recurrent 06/23/2016  . C. difficile diarrhea on po vanc 10/04/2015  . CAD (coronary artery disease) 2006, 2016   a. 08/2014 NSTEMI: Attempted PCI of 85% RPDA - unable to wire, complicated by dissection, case aborted;  b. 08/2014 Relook Cath: LM nl, LAD 60p, SP1 90ost (small), D1 65ost, RI 45/50, RCA nl, RPDA 90 - healed dissection;  b. 08/2014 Echo: EF 55%, mild/mon midapical inferior HK c. Cath 05/2015 with CTO of the right-PDA and unsuccessful PCI  d.11/2018 uncess PTCA/stenting to PLOM.   [name]. CAD S/P unsuccessful PCI 08/18/14 04/20/2007  . chronic back   . CKD (chronic kidney disease), stage II   . COLONIC POLYPS, ADENOMATOUS, HX OF   . Diverticulitis   . Dyslipidemia    a. statin intolerant.  [name]. Dyspnea    with exertion  . Elevated troponin level 10/04/2015  . ENDOMETRIOSIS   . Essential hypertension   . Fibromyalgia   . GASTRIC ANTRAL VASCULAR ECTASIA   . GERD   . History of hiatal hernia   . Hypertension, uncontrolled 10/04/2015  . Insomnia   . IRON DEFICIENCY ANEMIA SECONDARY TO BLOOD LOSS   . Myocardial infarction (HCC) 08/2014  . Neuropathy   . NSTEMI (non-ST elevated myocardial infarction) (HCC) 08/18/2014  . Osteoarthritis    "back, hips, basically all over" (05/24/2015)  . Palpitations 03/06/2011  .  Paresthesia 08/01/2014  . Raynaud's syndrome   . Rosacea, acne   . Sicca syndrome (HCC)   . SJOGREN'S SYNDROME   . SUPRAVENTRICULAR TACHYCARDIA   . Vitamin D deficiency     Past Surgical History:  Procedure Laterality Date  . ABDOMINAL HYSTERECTOMY     left right ovary  . APPENDECTOMY    . BREAST EXCISIONAL BIOPSY Right   . BREAST EXCISIONAL BIOPSY Right   . BREAST LUMPECTOMY WITH RADIOACTIVE SEED LOCALIZATION Right 01/24/2014   Procedure: BREAST LUMPECTOMY WITH RADIOACTIVE SEED LOCALIZATION;  Surgeon: Avel Peace, MD;  Location: [facility];  Service: General;  Laterality: Right;  . CARDIAC CATHETERIZATION N/A 08/18/2014   Procedure: Left Heart Cath and Coronary Angiography;  Surgeon: Marykay Lex, MD;  Location: [facility] INVASIVE CV LAB;  Service: Cardiovascular;  Laterality: N/A;  . CARDIAC CATHETERIZATION  08/18/2014   Procedure: Coronary Balloon Angioplasty;  Surgeon: Marykay Lex, MD;  Location: [facility] INVASIVE CV LAB;  Service: Cardiovascular;;  . CARDIAC CATHETERIZATION N/A 08/21/2014   Procedure: Left Heart Cath and Coronary Angiography;  Surgeon: Marykay Lex, MD;  Location: [facility] INVASIVE CV LAB;  Service: Cardiovascular;  Laterality: N/A;  . CARDIAC CATHETERIZATION  2006  . CARDIAC CATHETERIZATION N/A 05/24/2015   Procedure: Left Heart Cath and Coronary Angiography;  Surgeon: Tonny Bollman, MD;  Location: [facility] INVASIVE CV LAB;  Service: Cardiovascular;  Laterality: N/A;  . COLONOSCOPY W/ BIOPSIES AND POLYPECTOMY  12/17/2006   adenomatous polyps, external hemorrhoids  . CORONARY BALLOON ANGIOPLASTY N/A 11/23/2018   Procedure: CORONARY BALLOON ANGIOPLASTY;  Surgeon: Swaziland, Peter M, MD;  Location: [facility] INVASIVE CV LAB;  Service: Cardiovascular;  Laterality: N/A;  . ESOPHAGOGASTRODUODENOSCOPY  11/27/2009   APC tretment of lesions  . EYE SURGERY    . HERNIA REPAIR    . HIATAL HERNIA REPAIR  2005   and paraesophageal  . LAPAROSCOPIC CHOLECYSTECTOMY    . LEFT HEART CATH AND CORONARY ANGIOGRAPHY N/A 11/23/2018   Procedure: LEFT HEART CATH AND CORONARY ANGIOGRAPHY;  Surgeon: Swaziland, Peter M, MD;  Location[facility] INVAS[facility] CV LAB;  Service: Cardiovascular;  Laterality: N/A;  . TEAR DUCT PROBING Left    "no plugs or anything"    Current Medications: Prior to Admission medications   Medication Sig Start Date End Date Taking? Authorizing Provider  acetaminophen (TYLENOL) 500 MG tablet Take 1,000 mg by mouth 3 (three) times daily.    [provider]  apixaban (ELIQUIS) 5 MG TABS tablet Take 1 tablet (5 mg total) by mouth 2 (two)  times daily. Start 12/22/2018 after completing Eliquis/apixaban starter pack 12/22/18   Shon Hale, MD  clopidogrel (PLAVIX) 75 MG tablet Take 1 tablet (75 mg total) by mouth daily. 11/24/18   Shon Hale, MD  Eliquis DVT/PE Starter Pack (ELIQUIS STARTER PACK) 5 MG TABS Take as directed on package: start with two-5mg  tablets twice daily for 7 days. On day 8, switch to one-5mg  tablet twice daily. 11/24/18   Shon Hale, MD  famotidine (PEPCID) 20 MG tablet Take 1 tablet (20 mg total) by mouth daily. 06/25/16   Vassie Loll, MD  feeding supplement, ENSURE ENLIVE, (ENSURE ENLIVE) LIQD Take 237 mLs by mouth 2 (two) times daily between meals. 06/24/16   Vassie Loll, MD  fluticasone [facility] ALLERGY RELIEF) 50 MCG/ACT nasal spray Place 1 spray into the nose daily as needed for allergies.    [provider]  furosemide (LASIX) 20 MG tablet Take 1 tablet (20 mg total) by mouth daily.  04/21/18 04/16/19  Tonny Bollmanooper, Michael, MD  isosorbide mononitrate (IMDUR) 60 MG 24 hr tablet Take 1 tablet (60 mg total) by mouth daily. 11/25/18   Shon HaleEmokpae, Courage, MD  loratadine (CLARITIN) 10 MG tablet Take 10 mg by mouth daily.    [provider]  metoprolol tartrate (LOPRESSOR) 25 MG tablet Take 0.5 tablets (12.5 mg total) by mouth 2 (two) times daily. 11/24/18   Shon HaleEmokpae, Courage, MD  Multiple Vitamins-Minerals (EMERGEN-C VITAMIN C PO) Take 1 tablet by mouth daily.    [provider]  nitroGLYCERIN (NITROSTAT) 0.4 MG SL tablet Place 1 tablet (0.4 mg total) under the tongue every 5 (five) minutes as needed for chest pain. 06/12/15   Tonny Bollmanooper, Michael, MD  pantoprazole (PROTONIX) 40 MG tablet Take 1 tablet (40 mg total) by mouth at bedtime. 11/24/18   Shon HaleEmokpae, Courage, MD  Polyvinyl Alcohol-Povidone (REFRESH OP) Place 1 drop into both eyes 2 (two) times daily.     [provider]  rosuvastatin (CRESTOR) 10 MG tablet Take 1 tablet (10 mg total) by mouth every evening. 11/24/18   Shon HaleEmokpae, Courage,  MD    Allergies:   Ezetimibe, Ezetimibe-simvastatin, Morphine, Rosuvastatin, Ambien [zolpidem tartrate], Plaquenil [hydroxychloroquine sulfate], Polytrim [polymyxin b-trimethoprim], and Prevacid [lansoprazole]   Social History   Socioeconomic History  . Marital status: Married    Spouse name: Not on file  . Number of children: 3  . Years of education: college  . Highest education level: Not on file  Occupational History  . Occupation: retired  Engineer, productionocial Needs  . Financial resource strain: Not on file  . Food insecurity    Worry: Not on file    Inability: Not on file  . Transportation needs    Medical: Not on file    Non-medical: Not on file  Tobacco Use  . Smoking status: Never Smoker  . Smokeless tobacco: Never Used  Substance and Sexual Activity  . Alcohol use: Yes    Comment: occasional wine  . Drug use: No  . Sexual activity: Not Currently  Lifestyle  . Physical activity    Days per week: Not on file    Minutes per session: Not on file  . Stress: Not on file  Relationships  . Social Musicianconnections    Talks on phone: Not on file    Gets together: Not on file    Attends religious service: Not on file    Active member of club or organization: Not on file    Attends meetings of clubs or organizations: Not on file    Relationship status: Not on file  Other Topics Concern  . Not on file  Social History Narrative   Patient drinks caffeine occasionally.   Patient is right handed.     Family History:  The patient's family history includes Breast cancer in her daughter, sister, sister, and sister; Cancer in her daughter and sister; Coronary artery disease in her brother, brother, brother, father, and mother; Diabetes in her mother; Heart attack in her father; Heart disease in her brother, father, and mother; Heart failure in her mother; Hyperlipidemia in her mother; Hypertension in her father and mother; Kidney failure in her mother; Peripheral vascular disease in her mother.    ROS:   Please see the history of present illness.    ROS All other systems reviewed and are negative.   PHYSICAL EXAM:   VS:  BP 128/64   Pulse 66   Ht 5\' 2"  (1.575 m)   Wt 120 lb 12.8  oz (54.8 kg)   BMI 22.09 kg/m    GEN: Thin frail elderly female in no acute distress  HEENT: normal  Neck: no JVD, carotid bruits, or masses Cardiac: RRR; soft systolic murmurs, rubs, or gallops,no edema  Respiratory:  clear to auscultation bilaterally, normal work of breathing GI: soft, nontender, nondistended, + BS MS: no deformity or atrophy  Skin: warm and dry, no rash Neuro:  Alert and Oriented x 3, Strength and sensation are intact Psych: euthymic mood, full affect  Wt Readings from Last 3 Encounters:  12/06/18 120 lb 12.8 oz (54.8 kg)  11/23/18 114 lb 6.4 oz (51.9 kg)  04/14/18 121 lb 6.4 oz (55.1 kg)      Studies/Labs Reviewed:  S EKG:  EKG is not ordered today.    Recent Labs: 04/14/2018: ALT 6; NT-Pro BNP 982 11/19/2018: B Natriuretic Peptide 794.7 11/20/2018: TSH 2.290 11/24/2018: BUN 18; Creatinine, Ser 0.98; Hemoglobin 9.3; Platelets 176; Potassium 4.8; Sodium 134   Lipid Panel    Component Value Date/Time   CHOL 192 11/20/2018 0651   TRIG 53 11/20/2018 0651   HDL 76 11/20/2018 0651   CHOLHDL 2.5 11/20/2018 0651   VLDL 11 11/20/2018 0651   LDLCALC 105 (H) 11/20/2018 0651    Additional studies/ records that were reviewed today include:   Cath: 11/23/18   Prox LAD lesion is 50% stenosed.  Dist LAD lesion is 25% stenosed.  Prox Cx to Mid Cx lesion is 50% stenosed.  Prox RCA lesion is 25% stenosed.  RPDA lesion is 100% stenosed.  Dist RCA lesion is 25% stenosed.  RPAV lesion is 99% stenosed.  Post intervention, there is a 99% residual stenosis.  The left ventricular systolic function is normal.  LV end diastolic pressure is normal.  The left ventricular ejection fraction is 55-65% by visual estimate.  1. Single vessel obstructive CAD with CTO of the PDA  with collaterals. Compared to prior study there is a new 99% stenosis in the PLOM 2. Good LV function 3. Normal LVEDP 4. Unsuccessful PCI of the PLOM do to inability to cross the lesion with a balloon despite aggressive support.   Plan: medical management.   Diagnostic Dominance: Right  Intervention      Echo 04/2017 Left ventricle: The cavity size was normal. Wall thickness was   normal. Systolic function was normal. The estimated ejection   fraction was in the range of 55% to 60%. Wall motion was normal;   there were no regional wall motion abnormalities. Features are   consistent with a pseudonormal left ventricular filling pattern,   with concomitant abnormal relaxation and increased filling   pressure (grade 2 diastolic dysfunction). - Aortic valve: There was mild stenosis. There was mild to moderate   regurgitation. Valve area (VTI): 1.6 cm^2. Valve area (Vmax):   1.32 cm^2. Valve area (Vmean): 1.23 cm^2. - Pulmonary arteries: Systolic pressure was moderately increased.   PA peak pressure: 56 mm Hg (S).  ASSESSMENT & PLAN:    1. CAD Last cardiac catheterization as above.  Medical management.  Continue Plavix, Imdur, Lopressor and Crestor.  2.  Left lower extremity DVT -Continue Eliquis  3.  Left arm numbness -Already ruled out for stroke.  Good strength on exam.  Advised to follow-up with PCP for further evaluation.  4.  Hyperlipidemia -Continue statin.  Medication Adjustments/Labs and Tests Ordered: Current medicines are reviewed at length with the patient today.  Concerns regarding medicines are outlined above.  Medication changes, Labs and Tests  ordered today are listed in the Patient Instructions below. Patient Instructions  Medication Instructions:   Your physician recommends that you continue on your current medications as directed. Please refer to the Current Medication list given to you today.  If you need a refill on your cardiac medications  before your next appointment, please call your pharmacy.   Lab work:  None ordered today  If you have labs (blood work) drawn today and your tests are completely normal, you will receive your results only by: [name] MyChart Message (if you have MyChart) OR . A paper copy in the mail If you have any lab test that is abnormal or we need to change your treatment, we will call you to review the results.  Testing/Procedures:  None ordered today  Follow-Up:  On 02/21/19 at 11AM with Sherren Mocha, MD     Signed, Leanor Kail, [place]  12/06/2018 11:44 AM    [facility] [address], [place], [place]  [address] Phone: [phone]; Fax: [phone]

## 2018-12-06 ENCOUNTER — Encounter: Payer: Self-pay | Admitting: Physician Assistant

## 2018-12-06 ENCOUNTER — Other Ambulatory Visit: Payer: Self-pay

## 2018-12-06 ENCOUNTER — Ambulatory Visit (INDEPENDENT_AMBULATORY_CARE_PROVIDER_SITE_OTHER): Payer: Medicare Other | Admitting: Physician Assistant

## 2018-12-06 VITALS — BP 128/64 | HR 66 | Ht 62.0 in | Wt 120.8 lb

## 2018-12-06 DIAGNOSIS — I82402 Acute embolism and thrombosis of unspecified deep veins of left lower extremity: Secondary | ICD-10-CM | POA: Diagnosis not present

## 2018-12-06 DIAGNOSIS — E785 Hyperlipidemia, unspecified: Secondary | ICD-10-CM

## 2018-12-06 DIAGNOSIS — I25119 Atherosclerotic heart disease of native coronary artery with unspecified angina pectoris: Secondary | ICD-10-CM

## 2018-12-06 DIAGNOSIS — I5032 Chronic diastolic (congestive) heart failure: Secondary | ICD-10-CM | POA: Diagnosis not present

## 2018-12-06 NOTE — Patient Instructions (Signed)
Medication Instructions:   Your physician recommends that you continue on your current medications as directed. Please refer to the Current Medication list given to you today.  If you need a refill on your cardiac medications before your next appointment, please call your pharmacy.   Lab work:  None ordered today  If you have labs (blood work) drawn today and your tests are completely normal, you will receive your results only by: [name] MyChart Message (if you have MyChart) OR . A paper copy in the mail If you have any lab test that is abnormal or we need to change your treatment, we will call you to review the results.  Testing/Procedures:  None ordered today  Follow-Up:  On 02/21/19 at 11AM with Sherren Mocha, MD

## 2018-12-10 ENCOUNTER — Telehealth: Payer: Self-pay | Admitting: *Deleted

## 2018-12-10 NOTE — Telephone Encounter (Signed)
Patient with diagnosis of DVT on Eliquis for anticoagulation.    Procedure: EXTRACTION OF MULTIPLE TEETH  Date of procedure: TBD  Patient was diagnosed with DVT on 11/19/2018. She is still undergoing active treatment for this. Patient should be on uninterrupted anticoagulation for at least 6 months (until 05/19/2019).  If patient were to only have 1-2 teeth pulled, she would not need to hold anticoagulation.  I would advise if she needs to have more than 2 teeth pulled on the same occassion, that it be delayed until after 05/19/2019.

## 2018-12-10 NOTE — Telephone Encounter (Signed)
   [facility] Pre-operative Risk Assessment    Request for surgical clearance:  1. What type of surgery is being performed? EXTRACTION OF MULTIPLE TEETH   2. When is this surgery scheduled? TBD   3. What type of clearance is required (medical clearance vs. Pharmacy clearance to hold med vs. Both)? BOTH  4. Are there any medications that need to be held prior to surgery and how long? MED LIST REFLECTS BOTH ELIQUIS AND PLAVIX    5. Practice name and name of physician performing surgery? [facility]; DR. Sheppard Coil [facility]    6. What is your office phone number [phone]    7.   What is your office fax number [phone]  8.   Anesthesia type (None, local, MAC, general) ? IV SEDATION   Julaine Hua 12/10/2018, 9:41 AM  _________________________________________________________________   (provider comments below)

## 2018-12-10 NOTE — Telephone Encounter (Signed)
Called the requesting office and the office closes at [place] on Friday's. Will need to give the office a call on Monday to get the needed information.

## 2018-12-10 NOTE — Telephone Encounter (Signed)
Callback pool to contact requesting provider and identify the number of teeth being pulled. If unable to give a exact number, at least give a estimate. Also check to see if the procedure is urgent or can it be delayed until after March 2021.

## 2018-12-13 NOTE — Telephone Encounter (Signed)
Spoke with requesting office asking how many teeth will be pulled and if procedure is urgent or can be delayed until after March as requested by Almyra Deforest, PA. Person who answered the phone stated all of the providers are in surgery all morning and she will make a note in the chart which they will answer as soon as they are able today. I gave our office phone number and said she can either speak with triage RN or they can direct a message to me and I will call her back.

## 2018-12-15 NOTE — Telephone Encounter (Signed)
I have sent a fax to requesting office successfully via [place] fax. I will call requesting office again if we have not heard back by this afternoon.

## 2018-12-15 NOTE — Telephone Encounter (Signed)
Spoke with [name] from [facility]. She stated the treatment plan for pt is to have 2 teeth pulled, tooth number 11 and 14. Informed I will pass information along and will send them clearance recommendations for pt. She verbalized thanks.

## 2018-12-16 NOTE — Telephone Encounter (Signed)
   Primary Cardiologist: Sherren Mocha, MD  Chart reviewed as part of pre-operative protocol coverage. Simple dental extractions (1-2 teeth) are considered low risk procedures per guidelines and generally do not require any specific cardiac clearance. It is also generally accepted that for simple extractions and dental cleanings, there is no need to interrupt blood thinner therapy.   SBE prophylaxis is not required for the patient.   Per our clinical pharmacist: Patient with diagnosis of DVT on Eliquis for anticoagulation.    Procedure: EXTRACTION OF MULTIPLE TEETH Date of procedure: TBD  Patient was diagnosed with DVT on 11/19/2018. She is still undergoing active treatment for this. Patient should be on uninterrupted anticoagulation for at least 6 months (until 05/19/2019).  If patient were to only have 1-2 teeth pulled, she would not need to hold anticoagulation.  I would advise if she needs to have more than 2 teeth pulled on the same occassion, that it be delayed until after 05/19/2019.  I will route this recommendation to the requesting party via Epic fax function and remove from pre-op pool.  Please call with questions.  Hood, PA 12/16/2018, 8:04 AM

## 2019-01-12 ENCOUNTER — Other Ambulatory Visit: Payer: Self-pay | Admitting: Cardiovascular Disease

## 2019-02-21 ENCOUNTER — Ambulatory Visit: Payer: Medicare Other | Admitting: Cardiovascular Disease

## 2019-03-03 ENCOUNTER — Telehealth: Payer: Self-pay | Admitting: *Deleted

## 2019-03-03 NOTE — Telephone Encounter (Signed)
A message was left, re: her new patient appointment. 

## 2019-03-25 ENCOUNTER — Ambulatory Visit: Payer: Medicare Other | Admitting: Cardiovascular Disease

## 2019-03-29 ENCOUNTER — Telehealth: Payer: Self-pay | Admitting: Cardiovascular Disease

## 2019-03-29 ENCOUNTER — Encounter: Payer: Self-pay | Admitting: Interventional Cardiology

## 2019-03-29 NOTE — Telephone Encounter (Signed)
Fine with me too thanks

## 2019-03-29 NOTE — Telephone Encounter (Signed)
That is fine with me.

## 2019-03-29 NOTE — Telephone Encounter (Signed)
  Patient would like to switch from Dr Excell Seltzer to see Dr Allyson Sabal

## 2019-04-05 ENCOUNTER — Ambulatory Visit: Payer: Medicare Other | Admitting: Cardiovascular Disease

## 2019-04-08 ENCOUNTER — Encounter: Payer: Self-pay | Admitting: Cardiovascular Disease

## 2019-04-08 ENCOUNTER — Other Ambulatory Visit: Payer: Self-pay | Admitting: Cardiovascular Disease

## 2019-04-08 NOTE — Telephone Encounter (Signed)
*  STAT* If patient is at the pharmacy, call can be transferred to refill team.   1. Which medications need to be refilled? (please list name of each medication and dose if known) apixaban (ELIQUIS) 5 MG TABS tablet  2. Which pharmacy/location (including street and city if local pharmacy) is medication to be sent to? [facility] [address] [place], [place] - [address]  3. Do they need a 30 day or 90 day supply? 90 day

## 2019-04-08 NOTE — Telephone Encounter (Signed)
error 

## 2019-04-08 NOTE — Telephone Encounter (Addendum)
Pt has requested an Eliquis refill. Pt is on the Eliquis for DVT and this will be deferred to her PCP as this is not a cardiac indication.  Spoke with the pharmacy and they state that the pt has a proscription on hold there that they can fill and states it is not under Cardiology. Called the pt and left a message on the primary number and tried the alternate number and the person picks up but doesn't say anything, then the phone hangs up. Will try back later to update the pt.  At 1057am, returned a call back to the pt but had to leave a message to call [phone]. At 151pm, spoke with pt and she states she has been busy and she got the message. Explained to her that she has a refill there at [facility] per the pharmacy and that further refills need to be sent to her PCP and she verbalized understanding. Pt begin to tell me about her recent losses and crying, expressed sympathy and listened to her. She stated she was sorry explained it is okay and I'm sorry for her losses. She stated thank you and stated she would follow up with [facility] to see if prescription is ready.

## 2019-04-19 ENCOUNTER — Ambulatory Visit: Payer: Medicare Other | Admitting: Cardiovascular Disease

## 2019-07-01 ENCOUNTER — Encounter: Payer: Self-pay | Admitting: Internal Medicine

## 2019-07-01 ENCOUNTER — Other Ambulatory Visit: Payer: Self-pay

## 2019-07-01 ENCOUNTER — Ambulatory Visit: Payer: Medicare Other | Admitting: Internal Medicine

## 2019-07-01 VITALS — BP 108/64 | HR 91 | Ht 62.0 in | Wt 122.2 lb

## 2019-07-01 DIAGNOSIS — I5032 Chronic diastolic (congestive) heart failure: Secondary | ICD-10-CM | POA: Diagnosis not present

## 2019-07-01 DIAGNOSIS — I10 Essential (primary) hypertension: Secondary | ICD-10-CM

## 2019-07-01 DIAGNOSIS — I25119 Atherosclerotic heart disease of native coronary artery with unspecified angina pectoris: Secondary | ICD-10-CM | POA: Diagnosis not present

## 2019-07-01 DIAGNOSIS — E785 Hyperlipidemia, unspecified: Secondary | ICD-10-CM | POA: Diagnosis not present

## 2019-07-01 DIAGNOSIS — I214 Non-ST elevation (NSTEMI) myocardial infarction: Secondary | ICD-10-CM

## 2019-07-01 DIAGNOSIS — I82402 Acute embolism and thrombosis of unspecified deep veins of left lower extremity: Secondary | ICD-10-CM | POA: Diagnosis not present

## 2019-07-01 DIAGNOSIS — R0602 Shortness of breath: Secondary | ICD-10-CM

## 2019-07-01 NOTE — Patient Instructions (Signed)
Medication Instructions:  No changes  See instruction below   *If you need a refill on your cardiac medications before your next appointment, please call your pharmacy*   Lab Work: Not needed  If you have labs (blood work) drawn today and your tests are completely normal, you will receive your results only by: [name] MyChart Message (if you have MyChart) OR . A paper copy in the mail If you have any lab test that is abnormal or we need to change your treatment, we will call you to review the results.   Testing/Procedures: Not needed   Follow-Up: At [facility], you and your health needs are our priority.  As part of our continuing mission to provide you with exceptional heart care, we have created designated Provider Care Teams.  These Care Teams include your primary Cardiologist (physician) and Advanced Practice Providers (APPs -  Physician Assistants and Nurse Practitioners) who all work together to provide you with the care you need, when you need it.  We recommend signing up for the patient portal called "MyChart".  Sign up information is provided on this After Visit Summary.  MyChart is used to connect with patients for Virtual Visits (Telemedicine).  Patients are able to view lab/test results, encounter notes, upcoming appointments, etc.  Non-urgent messages can be sent to your provider as well.   To learn more about what you can do with MyChart, go to [email or web address].    Your next appointment:   6 month(s)  The format for your next appointment:   In Person  Provider:   Weston Brass, MD   Other Instructions clearance for dental procedure  -  Okay for dental procedure  Please hold Clopidogrel /Plavix 75 mg  5 days prior to procedure, and Eliquis 5 mg  2 days prior to procedure. Restart both the day after procedure if okay per dentist.

## 2019-07-01 NOTE — Progress Notes (Signed)
Cardiology Office Note:    Date:  07/01/2019   ID:  [name], DOB 1935-12-29, MRN [id number]  PCP:  Laurann Montana, MD  Cardiologist:  Tonny Bollman, MD  Electrophysiologist:  None   Referring MD: Laurann Montana, MD   Chief Complaint: Cardiovascular evaluation prior to dental procedure  History of Present Illness:    [name] is a 84 y.o. female with a history of coronary artery disease, chronic diastolic heart failure, hyperlipidemia. Patient was last seen on 12/06/2018 by my colleague Manson Passey PA.   In June 2016 she had an NSTEMI treated with an attempted PCI of the right PDA, this was complicated by wire dissection and the case was aborted. Repeat catheterization showed normal left main, 60% proximal LAD, 90% septal perforator, 65% diagonal, 50% ramus intermedius, normal RCA with 90% right PDA and healed dissection. Preserved ejection fraction was noted. 1 year later she had repeat attempt at PCI of the PDA which was unsuccessful.   In September 2020 had an NSTEMI with acute left lower extremity DVT. High-sensitivity troponin peaked at 246. She underwent coronary angiography with attempts at stenting to the posterior lateral branch which was noted to be nearly subtotally occluded, it was unable to be crossed with a balloon., And medical therapy was recommended. She was continued on Plavix and Eliquis for coronary artery disease, NSTEMI, and newly recognized DVT. She had some neurologic symptoms with left arm numbness during hospitalization and brain MRI did not show evidence of stroke. MRI of the C-spine showed cervical disc disease and spinal stenosis.  She has several loose teeth which need to be pulled per her report and she presents today for guidance on management of her antiplatelet and anticoagulant therapy prior to this procedure.  The patient has memory issues and is unsure who her dentist as and where their office is located. She provided me with her son's  contact information-[name] a neurologist in the [place] area. Details of our conversation below.  She feels she is short of breath with activities. She has had no recurrent chest pain, no bleeding incidents. No syncope or palpitations.  Past Medical History:  Diagnosis Date  . Abnormal nuclear stress test 05/24/2015  . Atypical ductal hyperplasia of right breast 02/22/2014  . C. difficile colitis - recurrent 06/23/2016  . C. difficile diarrhea on po vanc 10/04/2015  . CAD (coronary artery disease) 2006, 2016   a. 08/2014 NSTEMI: Attempted PCI of 85% RPDA - unable to wire, complicated by dissection, case aborted;  b. 08/2014 Relook Cath: LM nl, LAD 60p, SP1 90ost (small), D1 65ost, RI 45/50, RCA nl, RPDA 90 - healed dissection;  b. 08/2014 Echo: EF 55%, mild/mon midapical inferior HK c. Cath 05/2015 with CTO of the right-PDA and unsuccessful PCI  d.11/2018 uncess PTCA/stenting to PLOM.   [name] CAD S/P unsuccessful PCI 08/18/14 04/20/2007  . chronic back   . CKD (chronic kidney disease), stage II   . COLONIC POLYPS, ADENOMATOUS, HX OF   . Diverticulitis   . Dyslipidemia    a. statin intolerant.  [name] Dyspnea    with exertion  . Elevated troponin level 10/04/2015  . ENDOMETRIOSIS   . Essential hypertension   . Fibromyalgia   . GASTRIC ANTRAL VASCULAR ECTASIA   . GERD   . History of hiatal hernia   . Hypertension, uncontrolled 10/04/2015  . Insomnia   . IRON DEFICIENCY ANEMIA SECONDARY TO BLOOD LOSS   . Myocardial infarction (HCC) 08/2014  . Neuropathy   .  NSTEMI (non-ST elevated myocardial infarction) ([place]) 08/18/2014  . Osteoarthritis    "back, hips, basically all over" (05/24/2015)  . Palpitations 03/06/2011  . Paresthesia 08/01/2014  . Raynaud's syndrome   . Rosacea, acne   . Sicca syndrome ([place])   . SJOGREN'S SYNDROME   . SUPRAVENTRICULAR TACHYCARDIA   . Vitamin D deficiency     Past Surgical History:  Procedure Laterality Date  . ABDOMINAL HYSTERECTOMY     left right ovary  .  APPENDECTOMY    . BREAST EXCISIONAL BIOPSY Right   . BREAST EXCISIONAL BIOPSY Right   . BREAST LUMPECTOMY WITH RADIOACTIVE SEED LOCALIZATION Right 01/24/2014   Procedure: BREAST LUMPECTOMY WITH RADIOACTIVE SEED LOCALIZATION;  Surgeon: Jackolyn Confer, MD;  Location: [place];  Service: General;  Laterality: Right;  . CARDIAC CATHETERIZATION N/A 08/18/2014   Procedure: Left Heart Cath and Coronary Angiography;  Surgeon: Leonie Man, MD;  Location: [facility] [facility];  Service: Cardiovascular;  Laterality: N/A;  . CARDIAC CATHETERIZATION  08/18/2014   Procedure: Coronary Balloon Angioplasty;  Surgeon: Leonie Man, MD;  Location: [place] [facility];  Service: Cardiovascular;;  . CARDIAC CATHETERIZATION N/A 08/21/2014   Procedure: Left Heart Cath and Coronary Angiography;  Surgeon: Leonie Man, MD;  Location: [place];  Service: Cardiovascular;  Laterality: N/A;  . CARDIAC CATHETERIZATION  2006  . CARDIAC CATHETERIZATION N/A 05/24/2015   Procedure: Left Heart Cath and Coronary Angiography;  Surgeon: Sherren Mocha, MD;  Location: [facility];  Service: Cardiovascular;  Laterality: N/A;  . COLONOSCOPY W/ BIOPSIES AND POLYPECTOMY  12/17/2006   adenomatous polyps, external hemorrhoids  . CORONARY BALLOON ANGIOPLASTY N/A 11/23/2018   Procedure: CORONARY BALLOON ANGIOPLASTY;  Surgeon: Martinique, Peter M, MD;  Location: [facility];  Service: Cardiovascular;  Laterality: N/A;  . ESOPHAGOGASTRODUODENOSCOPY  11/27/2009   APC tretment of lesions  . EYE SURGERY    . HERNIA REPAIR    . HIATAL HERNIA REPAIR  2005   and paraesophageal  . LAPAROSCOPIC CHOLECYSTECTOMY    . LEFT HEART CATH AND CORONARY ANGIOGRAPHY N/A 11/23/2018   Procedure: LEFT HEART CATH AND CORONARY ANGIOGRAPHY;  Surgeon: Martinique, Peter M, MD;  Location: [facility];  Service: Cardiovascular;  Laterality: N/A;  . TEAR DUCT PROBING Left    "no plugs or anything"    Current Medications: Current  Meds  Medication Sig  . acetaminophen (TYLENOL) 500 MG tablet Take 1,000 mg by mouth as needed.   [name] amLODipine (NORVASC) 2.5 MG tablet TAKE 1 2 (ONE HALF) TABLET BY MOUTH ONCE DAILY AND 1 AS NEEDED FOR SYSTOLIC BLOOD PRESSURE GREATER THAN 160  . apixaban (ELIQUIS) 5 MG TABS tablet Take 1 tablet (5 mg total) by mouth 2 (two) times daily. Start 12/22/2018 after completing Eliquis/apixaban starter pack  . clopidogrel (PLAVIX) 75 MG tablet Take 1 tablet (75 mg total) by mouth daily.  . feeding supplement, ENSURE ENLIVE, (ENSURE ENLIVE) LIQD Take 237 mLs by mouth 2 (two) times daily between meals.  . fluticasone (FLONASE ALLERGY RELIEF) 50 MCG/ACT nasal spray Place 1 spray into the nose daily as needed for allergies.  . furosemide (LASIX) 20 MG tablet Take 1 tablet (20 mg total) by mouth daily.  [name] loratadine (CLARITIN) 10 MG tablet Take 10 mg by mouth daily.  . metoprolol tartrate (LOPRESSOR) 25 MG tablet Take 0.5 tablets (12.5 mg total) by mouth 2 (two) times daily.  . nitroGLYCERIN (NITROSTAT) 0.4 MG SL tablet Place 1  tablet (0.4 mg total) under the tongue every 5 (five) minutes as needed for chest pain.  . Polyvinyl Alcohol-Povidone (REFRESH OP) Place 1 drop into both eyes 2 (two) times daily.   . rosuvastatin (CRESTOR) 10 MG tablet Take 1 tablet (10 mg total) by mouth every evening.     Allergies:   Ezetimibe, Ezetimibe-simvastatin, Morphine, Rosuvastatin, Ambien [zolpidem tartrate], Plaquenil [hydroxychloroquine sulfate], Polytrim [polymyxin b-trimethoprim], and Prevacid [lansoprazole]   Social History   Socioeconomic History  . Marital status: Married    Spouse name: Not on file  . Number of children: 3  . Years of education: college  . Highest education level: Not on file  Occupational History  . Occupation: retired  Tobacco Use  . Smoking status: Never Smoker  . Smokeless tobacco: Never Used  Substance and Sexual Activity  . Alcohol use: Yes    Comment: occasional wine  . Drug  use: No  . Sexual activity: Not Currently  Other Topics Concern  . Not on file  Social History Narrative   Patient drinks caffeine occasionally.   Patient is right handed.   Social Determinants of Health   Financial Resource Strain:   . Difficulty of Paying Living Expenses:   Food Insecurity:   . Worried About Programme researcher, broadcasting/film/video in the Last Year:   . Barista in the Last Year:   Transportation Needs:   . Freight forwarder (Medical):   [name] Lack of Transportation (Non-Medical):   Physical Activity:   . Days of Exercise per Week:   . Minutes of Exercise per Session:   Stress:   . Feeling of Stress :   Social Connections:   . Frequency of Communication with Friends and Family:   . Frequency of Social Gatherings with Friends and Family:   . Attends Religious Services:   . Active Member of Clubs or Organizations:   . Attends Banker Meetings:   [name] Marital Status:      Family History: The patient's family history includes Breast cancer in her daughter, sister, sister, and sister; Cancer in her daughter and sister; Coronary artery disease in her brother, brother, brother, father, and mother; Diabetes in her mother; Heart attack in her father; Heart disease in her brother, father, and mother; Heart failure in her mother; Hyperlipidemia in her mother; Hypertension in her father and mother; Kidney failure in her mother; Peripheral vascular disease in her mother. There is no history of Colon cancer, Esophageal cancer, Stomach cancer, or Rectal cancer.  ROS:   Please see the history of present illness.    All other systems reviewed and are negative.  EKGs/Labs/Other Studies Reviewed:    The following studies were reviewed today:  EKG:  NSR  Recent Labs: 11/19/2018: B Natriuretic Peptide 794.7 11/20/2018: TSH 2.290 11/24/2018: BUN 18; Creatinine, Ser 0.98; Hemoglobin 9.3; Platelets 176; Potassium 4.8; Sodium 134  Recent Lipid Panel    Component Value Date/Time    CHOL 192 11/20/2018 0651   TRIG 53 11/20/2018 0651   HDL 76 11/20/2018 0651   CHOLHDL 2.5 11/20/2018 0651   VLDL 11 11/20/2018 0651   LDLCALC 105 (H) 11/20/2018 0651    Physical Exam:    VS:  BP 108/64   Pulse 91   Ht 5\' 2"  (1.575 m)   Wt 122 lb 3.2 oz (55.4 kg)   BMI 22.35 kg/m     Wt Readings from Last 5 Encounters:  07/01/19 122 lb 3.2 oz (55.4 kg)  12/06/18  120 lb 12.8 oz (54.8 kg)  11/23/18 114 lb 6.4 oz (51.9 kg)  04/14/18 121 lb 6.4 oz (55.1 kg)  08/31/17 131 lb 12.8 oz (59.8 kg)     Constitutional: No acute distress Eyes: sclera non-icteric, normal conjunctiva and lids ENMT: Poor dentition, several loose teeth Cardiovascular: regular rhythm, normal rate, no murmurs. S1 and S2 normal. Radial pulses normal bilaterally. No jugular venous distention.  Respiratory: clear to auscultation bilaterally GI : normal bowel sounds, soft and nontender. No distention.   MSK: extremities warm, well perfused. No edema.  NEURO: grossly nonfocal exam, moves all extremities. PSYCH: alert and oriented x 3, normal mood and affect.   ASSESSMENT:    1. Coronary artery disease involving native coronary artery of native heart with angina pectoris (HCC)   2. Chronic diastolic heart failure (HCC)   3. Deep vein thrombosis (DVT) of left lower extremity, unspecified chronicity, unspecified vein (HCC)   4. Hyperlipidemia LDL goal <70   5. Shortness of breath   6. Essential hypertension   7. Non-ST elevation (NSTEMI) myocardial infarction [facility])    PLAN:    Coronary artery disease involving native coronary artery of native heart with angina pectoris (HCC) - Plan: EKG 12-Lead  Chronic diastolic heart failure (HCC) - Plan: EKG 12-Lead  Deep vein thrombosis (DVT) of left lower extremity, unspecified chronicity, unspecified vein (HCC)  Hyperlipidemia LDL goal <70  Shortness of breath  Essential hypertension  Non-ST elevation (NSTEMI) myocardial infarction [facility])   The nature of the  patient's DVT is unclear, however after greater than 6 months of therapy, risk-benefit analysis would suggest that stopping for 2 days prior to dental procedure involving tooth extraction would be reasonable. Given the patient's memory issues, I discussed the risk-benefit analysis with her son. I called her son on the day of the visit and received a return call on 07/09/2019.  She is being medically treated with Plavix for NSTEMI. She is within the 1 year timeframe of her NSTEMI, therefore there is a potential risk of cardiovascular complication stopping Plavix for procedure, however again risk-benefit analysis was discussed with her son Dr. Valentina Lucks and we participated in shared decision making. We determined that since it has been greater than 6 months since her NSTEMI and she has chronic CAD, it would be reasonable to hold Plavix for 5 days prior to dental work and resume as soon as safe from her dentist perspective. She should also resume Eliquis after dental work when safe, however we should review indications for continued Eliquis therapy as her DVT may have been provoked but it is unclear at this time.  Discussed with Son Dr. Valentina Lucks 07/09/19  - ok to hold eliquis 2 days prior, ok to hold plavix 5 days prior to procedure for dental care. Discussed risks and benefits by phone , dental work coming up this week.   Total time of encounter: 45 minutes total time of encounter, including 30 minutes spent in face-to-face patient care on the date of this encounter. This time includes coordination of care and counseling regarding above mentioned problem list. Remainder of non-face-to-face time involved reviewing chart documents/testing relevant to the patient encounter and documentation in the medical record. I have independently reviewed documentation from referring provider.   Weston Brass, MD [place]  [facility]    Medication Adjustments/Labs and Tests Ordered: Current medicines are reviewed at  length with the patient today.  Concerns regarding medicines are outlined above.  Orders Placed This Encounter  Procedures  .  EKG 12-Lead   No orders of the defined types were placed in this encounter.   Patient Instructions  Medication Instructions:  No changes  See instruction below   *If you need a refill on your cardiac medications before your next appointment, please call your pharmacy*   Lab Work: Not needed  If you have labs (blood work) drawn today and your tests are completely normal, you will receive your results only by: [name] MyChart Message (if you have MyChart) OR . A paper copy in the mail If you have any lab test that is abnormal or we need to change your treatment, we will call you to review the results.   Testing/Procedures: Not needed   Follow-Up: At [facility], you and your health needs are our priority.  As part of our continuing mission to provide you with exceptional heart care, we have created designated Provider Care Teams.  These Care Teams include your primary Cardiologist (physician) and Advanced Practice Providers (APPs -  Physician Assistants and Nurse Practitioners) who all work together to provide you with the care you need, when you need it.  We recommend signing up for the patient portal called "MyChart".  Sign up information is provided on this After Visit Summary.  MyChart is used to connect with patients for Virtual Visits (Telemedicine).  Patients are able to view lab/test results, encounter notes, upcoming appointments, etc.  Non-urgent messages can be sent to your provider as well.   To learn more about what you can do with MyChart, go to [email or web address].    Your next appointment:   6 month(s)  The format for your next appointment:   In Person  Provider:   Weston Brass, MD   Other Instructions clearance for dental procedure  -  Okay for dental procedure  Please hold Clopidogrel /Plavix 75 mg  5 days prior to procedure,  and Eliquis 5 mg  2 days prior to procedure. Restart both the day after procedure if okay per dentist.

## 2019-08-05 ENCOUNTER — Other Ambulatory Visit: Payer: Self-pay | Admitting: Internal Medicine

## 2019-08-05 MED ORDER — ROSUVASTATIN CALCIUM 10 MG PO TABS: 10.0000 mg | ORAL_TABLET | Freq: Every evening | ORAL | 2 refills | Status: DC

## 2019-08-05 MED ORDER — CLOPIDOGREL BISULFATE 75 MG PO TABS: 75.0000 mg | ORAL_TABLET | Freq: Every day | ORAL | 3 refills | Status: AC

## 2019-08-05 MED ORDER — AMLODIPINE BESYLATE 2.5 MG PO TABS: ORAL_TABLET | ORAL | 0 refills | Status: DC

## 2019-08-05 MED ORDER — NITROGLYCERIN 0.4 MG SL SUBL: 0.4000 mg | SUBLINGUAL_TABLET | SUBLINGUAL | 1 refills | Status: AC | PRN

## 2019-08-05 MED ORDER — FUROSEMIDE 20 MG PO TABS: 20.0000 mg | ORAL_TABLET | Freq: Every day | ORAL | 3 refills | Status: AC

## 2019-08-05 MED ORDER — APIXABAN 5 MG PO TABS: 5.0000 mg | ORAL_TABLET | Freq: Two times a day (BID) | ORAL | 0 refills | Status: DC

## 2019-08-05 MED ORDER — METOPROLOL TARTRATE 25 MG PO TABS: 12.5000 mg | ORAL_TABLET | Freq: Two times a day (BID) | ORAL | 2 refills | Status: DC

## 2019-08-05 NOTE — Telephone Encounter (Signed)
New Message      *STAT* If patient is at the pharmacy, call can be transferred to refill team.   1. Which medications need to be refilled? (please list name of each medication and dose if known) clopidogrel (PLAVIX) 75 MG tablet amLODipine (NORVASC) 2.5 MG tablet metoprolol tartrate (LOPRESSOR) 25 MG tablet furosemide (LASIX) 20 MG tablet(Expired) rosuvastatin (CRESTOR) 10 MG tablet apixaban (ELIQUIS) 5 MG TABS tablet nitroGLYCERIN (NITROSTAT) 0.4 MG SL tablet  2. Which pharmacy/location (including street and city if local pharmacy) is medication to be sent to? [facility]   3. Do they need a 30 day or 90 day supply? 90   Please add pharmacy to preferred pharmacy

## 2019-08-05 NOTE — Telephone Encounter (Signed)
Patient on Eliquis for > 6 months after acute DVT. Will refill for 1 months and send message to Dr Jacques Navy to re-assess Eliquis therapy.

## 2019-08-05 NOTE — Telephone Encounter (Signed)
Discussed this with her son who is an MD on a phone call in April given patient's memory issues. Agree it is reasonable to stop eliquis now since DVT is >6 mo. I do not know the nature of her DVT (provoked vs unprovoked). Given age and other antiplatelet therapy, risk benefit would suggest that stopping Eliquis is the best option.

## 2019-08-08 ENCOUNTER — Other Ambulatory Visit: Payer: Self-pay | Admitting: Internal Medicine

## 2019-08-08 NOTE — Telephone Encounter (Signed)
*  STAT* If patient is at the pharmacy, call can be transferred to refill team.   1. Which medications need to be refilled? (please list name of each medication and dose if known) rosuvastatin (CRESTOR) 10 MG tablet, nitroGLYCERIN (NITROSTAT) 0.4 MG SL tablet, metoprolol tartrate (LOPRESSOR) 25 MG tablet, amLODipine (NORVASC) 2.5 MG tablet, clopidogrel (PLAVIX) 75 MG tablet, Isosorbide and Eliquis (not on med list)    2. Which pharmacy/location (including street and city if local pharmacy) is medication to be sent to? [facility] - [place], [place] - [place] [id number]  3. Do they need a 30 day or 90 day supply? 90   Patient is out of Metoprolol and Isosorbide.

## 2019-12-09 ENCOUNTER — Telehealth: Payer: Self-pay | Admitting: Internal Medicine

## 2019-12-09 ENCOUNTER — Other Ambulatory Visit: Payer: Self-pay

## 2019-12-09 ENCOUNTER — Encounter: Payer: Self-pay | Admitting: Cardiovascular Disease

## 2019-12-09 MED ORDER — APIXABAN 2.5 MG PO TABS: 2.5000 mg | ORAL_TABLET | Freq: Two times a day (BID) | ORAL | 5 refills | Status: DC

## 2019-12-09 MED ORDER — METOPROLOL TARTRATE 25 MG PO TABS: 12.5000 mg | ORAL_TABLET | Freq: Two times a day (BID) | ORAL | 2 refills | Status: AC

## 2019-12-09 NOTE — Telephone Encounter (Signed)
Please review for refill on Eliquis.   metoprolol tartrate (LOPRESSOR) 25 MG tablet 30 tablet 2 12/09/2019    Sig - Route: Take 0.5 tablets (12.5 mg total) by mouth 2 (two) times daily. - Oral   Sent to pharmacy as: metoprolol tartrate (LOPRESSOR) 25 MG tablet   E-Prescribing Status: Sent to pharmacy (12/09/2019  1:05 PM EDT)   Pharmacy  [facility] - [place], [place] - [address]

## 2019-12-09 NOTE — Telephone Encounter (Deleted)
Prescription refill request for Eliquis received. Indication:  DVT Last office visit:  06/2019 Jacques Navy Scr: 0.98 11/2018 Age: 84 Weight:  55.4 kg  Prescription refilled

## 2019-12-09 NOTE — Telephone Encounter (Signed)
OK to refill Eliquis, but she needs to be seen on a regular basis so we can continue to prescribe ongoing.   Candise Bowens - please arrange follow up with me for [name] in October or November in office.

## 2019-12-09 NOTE — Telephone Encounter (Signed)
Prescription refill request for Eliquis received. Indication: DVT Last office visit: 06/2019 Jacques Navy Scr: 11/2018 0.98 Age: 84 Weight:  55.4 kg  Prescription refilled

## 2019-12-09 NOTE — Telephone Encounter (Signed)
error 

## 2019-12-09 NOTE — Telephone Encounter (Signed)
*  STAT* If patient is at the pharmacy, call can be transferred to refill team.   1. Which medications need to be refilled? (please list name of each medication and dose if known) metoprolol tartrate (LOPRESSOR) 25 MG tablet and Eliquis  2. Which pharmacy/location (including street and city if local pharmacy) is medication to be sent to? [facility] - [place], [place] - [place] [id number]  3. Do they need a 30 day or 90 day supply? 90  Eliquis is no longer on the patients med list, not sure if Dr. Jacques Navy wants to refill.

## 2019-12-09 NOTE — Addendum Note (Signed)
Addended by: Sigurd Sos on: 12/09/2019 02:35 PM   Modules accepted: Orders

## 2019-12-09 NOTE — Telephone Encounter (Signed)
Please review for Eliquis refill ° °

## 2019-12-13 ENCOUNTER — Other Ambulatory Visit: Payer: Self-pay

## 2019-12-19 ENCOUNTER — Telehealth: Payer: Self-pay | Admitting: Internal Medicine

## 2019-12-19 NOTE — Telephone Encounter (Signed)
Spoke with patient about scheduling follow up with Jacques Navy from recall lists, patient wanted to be called back at later time

## 2019-12-26 ENCOUNTER — Encounter (HOSPITAL_COMMUNITY): Payer: Self-pay | Admitting: Emergency Medicine

## 2019-12-26 ENCOUNTER — Emergency Department (HOSPITAL_COMMUNITY)
Admission: EM | Admit: 2019-12-26 | Discharge: 2019-12-27 | Disposition: A | Discharge status: Home / Self Care | Payer: Medicare Other | Attending: Emergency Medicine | Admitting: Emergency Medicine

## 2019-12-26 DIAGNOSIS — I130 Hypertensive heart and chronic kidney disease with heart failure and stage 1 through stage 4 chronic kidney disease, or unspecified chronic kidney disease: Secondary | ICD-10-CM | POA: Diagnosis not present

## 2019-12-26 DIAGNOSIS — Z79899 Other long term (current) drug therapy: Secondary | ICD-10-CM | POA: Insufficient documentation

## 2019-12-26 DIAGNOSIS — I2510 Atherosclerotic heart disease of native coronary artery without angina pectoris: Secondary | ICD-10-CM | POA: Insufficient documentation

## 2019-12-26 DIAGNOSIS — I5032 Chronic diastolic (congestive) heart failure: Secondary | ICD-10-CM | POA: Diagnosis not present

## 2019-12-26 DIAGNOSIS — R202 Paresthesia of skin: Secondary | ICD-10-CM | POA: Diagnosis present

## 2019-12-26 DIAGNOSIS — N179 Acute kidney failure, unspecified: Secondary | ICD-10-CM | POA: Diagnosis not present

## 2019-12-26 DIAGNOSIS — N182 Chronic kidney disease, stage 2 (mild): Secondary | ICD-10-CM | POA: Diagnosis not present

## 2019-12-26 LAB — BASIC METABOLIC PANEL
Anion gap: 9 (ref 5–15)
BUN: 16 mg/dL (ref 8–23)
CO2: 21 mmol/L — ABNORMAL LOW (ref 22–32)
Calcium: 9.9 mg/dL (ref 8.9–10.3)
Chloride: 103 mmol/L (ref 98–111)
Creatinine, Ser: 1.38 mg/dL — ABNORMAL HIGH (ref 0.44–1.00)
GFR, Estimated: 36 mL/min — ABNORMAL LOW (ref >60)
Glucose, Bld: 97 mg/dL (ref 70–99)
Potassium: 5 mmol/L (ref 3.5–5.1)
Sodium: 133 mmol/L — ABNORMAL LOW (ref 135–145)

## 2019-12-26 LAB — TROPONIN I (HIGH SENSITIVITY)
Troponin I (High Sensitivity): 18 ng/L — ABNORMAL HIGH (ref <18)
Troponin I (High Sensitivity): 21 ng/L — ABNORMAL HIGH (ref <18)

## 2019-12-26 LAB — CBC
HCT: 35.8 % — ABNORMAL LOW (ref 36.0–46.0)
Hemoglobin: 11 g/dL — ABNORMAL LOW (ref 12.0–15.0)
MCH: 26.6 pg (ref 26.0–34.0)
MCHC: 30.7 g/dL (ref 30.0–36.0)
MCV: 86.5 fL (ref 80.0–100.0)
Platelets: 211 10*3/uL (ref 150–400)
RBC: 4.14 MIL/uL (ref 3.87–5.11)
RDW: 17.3 % — ABNORMAL HIGH (ref 11.5–15.5)
WBC: 5.7 10*3/uL (ref 4.0–10.5)
nRBC: 0 % (ref 0.0–0.2)

## 2019-12-26 NOTE — ED Triage Notes (Signed)
Pt transported from home by EMS c/o generalized fatigue, pt states she went to bed @1900  woke up 2000, not feeling well. Denies n/v/d, denies syncope. Denies pain.

## 2019-12-27 ENCOUNTER — Emergency Department (HOSPITAL_COMMUNITY): Payer: Medicare Other

## 2019-12-27 LAB — TROPONIN I (HIGH SENSITIVITY): Troponin I (High Sensitivity): 16 ng/L (ref <18)

## 2019-12-27 MED ORDER — SODIUM CHLORIDE 0.9 % IV BOLUS
500.0000 mL | Freq: Once | INTRAVENOUS | Status: AC
  Administered 2019-12-27: 500 mL via INTRAVENOUS

## 2019-12-27 NOTE — Discharge Instructions (Signed)
You were seen today in the emergency department for your left-sided arm numbness and tingling.  The work-up of your heart was very reassuring, there is no evidence of your heart attack or any acute problem with your heart.  We did find that your kidney function was a bit slow, we gave you some fluids by IV to help with this.  We have provided contact information above for a psychiatrist.  After discussion with you and your family, feel it is important that you call them to schedule an appointment for evaluation.  Please return the emergency department she develop any new sudden or severe symptoms.

## 2019-12-27 NOTE — ED Provider Notes (Addendum)
[facility] EMERGENCY DEPARTMENT Provider Note   CSN: [id number] Arrival date & time: 12/26/19  2007     History Chief Complaint  Patient presents with  . Fatigue    [name] is a 84 y.o. female who presented via EMS around 8 PM last night.  She states that her husband recently had a stroke, last night she went to check on him and noticed that her left arm felt really heavy and numb, she states "I did not really want to move it".  She says that because of her cardiac history she wanted to come into the emergency department.  She states she called EMS at the time.  At the time of my initial interview the patient appears to be a bit confused, she thinks it is dinnertime. She states that no one served her lunch while she is waiting in the waiting room.  Patient arrived via EMS around 8 PM last night.  The patient is concerned regarding her husband who recently had a stroke, states he is at home alone.  She states her son is the "head neurologist in [place]", he says he is coming here, tried to "set me straight".  I reviewed the patient's medical record.  She has a history of NSTEMI 2016 unsuccessful PCI.  Left heart cath 11/2018 with significant global CAD, unsuccessful PCI PLM, as unable to cross lesion with balloon despite aggressive support.  She also had lower extremity DVT at that time, now on Eliquis + plavix.  Patient is being treated for hypertension, hyperlipidemia, hypertension, Sjogren's disease, chronic diastolic congestive heart failure.  According to primary care note from 06/2019 the patient has some history with difficulty with her memory. Initially patient been seen multiple times in this emergency department for concerns of similar left arm numbness and heaviness.  She has had 2 negative MRIs of the brain.   HPI     Past Medical History:  Diagnosis Date  . Abnormal nuclear stress test 05/24/2015  . Atypical ductal hyperplasia of right breast  02/22/2014  . C. difficile colitis - recurrent 06/23/2016  . C. difficile diarrhea on po vanc 10/04/2015  . CAD (coronary artery disease) 2006, 2016   a. 08/2014 NSTEMI: Attempted PCI of 85% RPDA - unable to wire, complicated by dissection, case aborted;  b. 08/2014 Relook Cath: LM nl, LAD 60p, SP1 90ost (small), D1 65ost, RI 45/50, RCA nl, RPDA 90 - healed dissection;  b. 08/2014 Echo: EF 55%, mild/mon midapical inferior HK c. Cath 05/2015 with CTO of the right-PDA and unsuccessful PCI  d.11/2018 uncess PTCA/stenting to PLOM.   [name] CAD S/P unsuccessful PCI 08/18/14 04/20/2007  . chronic back   . CKD (chronic kidney disease), stage II   . COLONIC POLYPS, ADENOMATOUS, HX OF   . Diverticulitis   . Dyslipidemia    a. statin intolerant.  [name] Dyspnea    with exertion  . Elevated troponin level 10/04/2015  . ENDOMETRIOSIS   . Essential hypertension   . Fibromyalgia   . GASTRIC ANTRAL VASCULAR ECTASIA   . GERD   . History of hiatal hernia   . Hypertension, uncontrolled 10/04/2015  . Insomnia   . IRON DEFICIENCY ANEMIA SECONDARY TO BLOOD LOSS   . Myocardial infarction (HCC) 08/2014  . Neuropathy   . NSTEMI (non-ST elevated myocardial infarction) (HCC) 08/18/2014  . Osteoarthritis    "back, hips, basically all over" (05/24/2015)  . Palpitations 03/06/2011  . Paresthesia 08/01/2014  . Raynaud's syndrome   .  Rosacea, acne   . Sicca syndrome (HCC)   . SJOGREN'S SYNDROME   . SUPRAVENTRICULAR TACHYCARDIA   . Vitamin D deficiency     Patient Active Problem List   Diagnosis Date Noted  . Leg DVT (deep venous thromboembolism), acute, left (HCC) 11/21/2018  . Left arm numbness 11/19/2018  . CKD (chronic kidney disease), stage II   . SOB (shortness of breath)   . Elevated troponin   . Hyponatremia   . Hyperkalemia   . Chronic diastolic (congestive) heart failure (HCC)   . C. difficile colitis - recurrent 06/23/2016  . Hypertension, uncontrolled 10/04/2015  . Essential hypertension   . CAD (coronary artery  disease)   . Non-ST elevation (NSTEMI) myocardial infarction (HCC) 08/18/2014  . Paresthesia 08/01/2014  . Atypical ductal hyperplasia of right breast 02/22/2014  . Dizziness and giddiness 05/24/2013  . Palpitations 03/06/2011  . SJOGREN'S SYNDROME 04/16/2009  . SUPRAVENTRICULAR TACHYCARDIA 06/24/2008  . Dyslipidemia-statin and Zetia intol 04/20/2007  . Iron deficiency anemia 04/20/2007  . CAD S/P unsuccessful PCI 08/18/14 04/20/2007  . Right bundle branch block 04/20/2007  . Raynaud's syndrome 04/20/2007  . GERD 04/20/2007  . GASTRIC ANTRAL VASCULAR ECTASIA 04/20/2007  . ENDOMETRIOSIS 04/20/2007  . ACNE ROSACEA 04/20/2007  . OSTEOARTHRITIS 04/20/2007  . COLONIC POLYPS, ADENOMATOUS, HX OF 04/20/2007  . HEADACHE, CHRONIC, HX OF 04/20/2007    Past Surgical History:  Procedure Laterality Date  . ABDOMINAL HYSTERECTOMY     left right ovary  . APPENDECTOMY    . BREAST EXCISIONAL BIOPSY Right   . BREAST EXCISIONAL BIOPSY Right   . BREAST LUMPECTOMY WITH RADIOACTIVE SEED LOCALIZATION Right 01/24/2014   Procedure: BREAST LUMPECTOMY WITH RADIOACTIVE SEED LOCALIZATION;  Surgeon: Avel Peace, MD;  Location: [facility];  Service: General;  Laterality: Right;  . CARDIAC CATHETERIZATION N/A 08/18/2014   Procedure: Left Heart Cath and Coronary Angiography;  Surgeon: Marykay Lex, MD;  Location: [facility] INVASIVE CV LAB;  Service: Cardiovascular;  Laterality: N/A;  . CARDIAC CATHETERIZATION  08/18/2014   Procedure: Coronary Balloon Angioplasty;  Surgeon: Marykay Lex, MD;  Location: [facility] INVASIVE CV LAB;  Service: Cardiovascular;;  . CARDIAC CATHETERIZATION N/A 08/21/2014   Procedure: Left Heart Cath and Coronary Angiography;  Surgeon: Marykay Lex, MD;  Location: [facility] INVASIVE CV LAB;  Service: Cardiovascular;  Laterality: N/A;  . CARDIAC CATHETERIZATION  2006  . CARDIAC CATHETERIZATION N/A 05/24/2015   Procedure: Left Heart Cath and Coronary Angiography;  Surgeon: Tonny Bollman, MD;   Location: [facility] INVASIVE CV LAB;  Service: Cardiovascular;  Laterality: N/A;  . COLONOSCOPY W/ BIOPSIES AND POLYPECTOMY  12/17/2006   adenomatous polyps, external hemorrhoids  . CORONARY BALLOON ANGIOPLASTY N/A 11/23/2018   Procedure: CORONARY BALLOON ANGIOPLASTY;  Surgeon: Swaziland, Peter M, MD;  Location: [facility] INVASIVE CV LAB;  Service: Cardiovascular;  Laterality: N/A;  . ESOPHAGOGASTRODUODENOSCOPY  11/27/2009   AP[facility] tretment of lesions  . EYE SURGERY    . HERNIA REPAIR    . HIATAL HERNIA REPAIR  2005   and paraesophageal  . LAPAROSCOPIC CHOLECYSTECTOMY    . LEFT HEART CATH AND CORONARY ANGIOGRAPHY N/A 11/23/2018   Procedure: LEFT HEART CATH AND CORONARY ANGIOGRAPHY;  Surgeon: Swaziland, Peter M, MD;  Location: [facility] INVASIVE CV LAB;  Service: Cardiovascular;  Laterality: N/A;  . TEAR DUCT PROBING Left    "no plugs or anything"     OB History   No obstetric history on file.     Family History  Problem Relation Age of  Onset  . Heart failure Mother   . Diabetes Mother   . Kidney failure Mother   . Hypertension Mother   . Coronary artery disease Mother   . Heart disease Mother   . Hyperlipidemia Mother   . Peripheral vascular disease Mother        amputation  . Breast cancer Sister        x 3 sisters  . Cancer Sister   . Heart disease Father   . Hypertension Father   . Coronary artery disease Father   . Heart attack Father   . Coronary artery disease Brother   . Heart disease Brother   . Coronary artery disease Brother   . Coronary artery disease Brother   . Breast cancer Sister   . Breast cancer Sister   . Breast cancer Daughter   . Cancer Daughter   . Colon cancer Neg Hx   . Esophageal cancer Neg Hx   . Stomach cancer Neg Hx   . Rectal cancer Neg Hx     Social History   Tobacco Use  . Smoking status: Never Smoker  . Smokeless tobacco: Never Used  Vaping Use  . Vaping Use: Never used  Substance Use Topics  . Alcohol use: Yes    Comment: occasional wine  . Drug use: No      Home Medications Prior to Admission medications   Medication Sig Start Date End Date Taking? Authorizing Provider  amLODipine (NORVASC) 2.5 MG tablet TAKE 1 2 (ONE HALF) TABLET BY MOUTH ONCE DAILY AND 1 AS NEEDED FOR SYSTOLIC BLOOD PRESSURE GREATER THAN 160 Patient taking differently: Take 1.25 mg by mouth See admin instructions. Take 1/2 tablet by mouth once daily as needed for systolic blood pressure greater than 160. LF - 12/09/19 #45 08/05/19  Yes Parke Poisson, MD  apixaban (ELIQUIS) 2.5 MG TABS tablet Take 1 tablet (2.5 mg total) by mouth 2 (two) times daily. Patient taking differently: Take 5 mg by mouth 2 (two) times daily. LF - 12/12/19 #60 12/09/19  Yes Parke Poisson, MD  clopidogrel (PLAVIX) 75 MG tablet Take 1 tablet (75 mg total) by mouth daily. Patient taking differently: Take 75 mg by mouth in the morning. LF - 12/09/19 #30 08/05/19  Yes Parke Poisson, MD  furosemide (LASIX) 20 MG tablet Take 1 tablet (20 mg total) by mouth daily. Patient taking differently: Take 20 mg by mouth every evening. LF - 12/09/19 #30 08/05/19 07/30/20 Yes Parke Poisson, MD  isosorbide mononitrate (IMDUR) 60 MG 24 hr tablet Take 60 mg by mouth every morning. LF - 12/09/19 #30 12/09/19  Yes [provider]  metoprolol tartrate (LOPRESSOR) 25 MG tablet Take 0.5 tablets (12.5 mg total) by mouth 2 (two) times daily. Patient taking differently: Take 12.5 mg by mouth 2 (two) times daily. LF - 9/24/221 #30 12/09/19  Yes Parke Poisson, MD  rosuvastatin (CRESTOR) 10 MG tablet Take 1 tablet (10 mg total) by mouth every evening. Patient taking differently: Take 10 mg by mouth every evening. LF - 12/09/19 #30 08/05/19  Yes Parke Poisson, MD  acetaminophen (TYLENOL) 500 MG tablet Take 1,000 mg by mouth as needed.     [provider]  diclofenac Sodium (VOLTAREN) 1 % GEL Apply 4 g topically 4 (four) times daily. 09/15/19   [provider]  feeding supplement, ENSURE ENLIVE,  (ENSURE ENLIVE) LIQD Take 237 mLs by mouth 2 (two) times daily between meals. 06/24/16   Vassie Loll,  MD  fluticasone (FLONASE ALLERGY RELIEF) 50 MCG/ACT nasal spray Place 1 spray into the nose daily as needed for allergies.    [provider]  loratadine (CLARITIN) 10 MG tablet Take 10 mg by mouth daily.    [provider]  nitroGLYCERIN (NITROSTAT) 0.4 MG SL tablet Place 1 tablet (0.4 mg total) under the tongue every 5 (five) minutes as needed for chest pain. 08/05/19   Parke Poisson, MD  Polyvinyl Alcohol-Povidone (REFRESH OP) Place 1 drop into both eyes 2 (two) times daily.     [provider]    Allergies    Ezetimibe, Ezetimibe-simvastatin, Morphine, Rosuvastatin, Ambien [zolpidem tartrate], Plaquenil [hydroxychloroquine sulfate], Polytrim [polymyxin b-trimethoprim], and Prevacid [lansoprazole]  Review of Systems   Review of Systems  Constitutional: Negative.   HENT: Negative.   Respiratory: Negative for cough, chest tightness and shortness of breath.   Cardiovascular: Negative for chest pain, palpitations and leg swelling.  Gastrointestinal: Negative for nausea and vomiting.  Genitourinary: Negative for dysuria and hematuria.  Musculoskeletal: Positive for myalgias. Negative for neck pain.       Heaviness and numbness in her left arm  Skin: Negative.   Neurological: Negative for facial asymmetry, weakness, light-headedness and headaches.    Physical Exam Updated Vital Signs BP 128/88   Pulse 76   Temp 97.8 F (36.6 C) (Oral)   Resp 19   Ht  (1.575 m)   Wt 55 kg   SpO2 98%   BMI 22.18 kg/m   Physical Exam Vitals and nursing note reviewed.  HENT:     Head: Normocephalic and atraumatic.     Mouth/Throat:     Mouth: Mucous membranes are moist.     Pharynx: No oropharyngeal exudate or posterior oropharyngeal erythema.  Eyes:     General:        Right eye: No discharge.        Left eye: Discharge present.    Extraocular Movements:  Extraocular movements intact.     Conjunctiva/sclera: Conjunctivae normal.     Pupils: Pupils are equal, round, and reactive to light.     Visual Fields: Right eye visual fields normal and left eye visual fields normal.     Comments: Clear discharge from left eye   Cardiovascular:     Rate and Rhythm: Normal rate and regular rhythm.     Pulses: Normal pulses.     Heart sounds: Murmur heard.  Systolic murmur is present with a grade of 2/6.   Pulmonary:     Effort: Pulmonary effort is normal. No respiratory distress.     Breath sounds: Normal breath sounds. No wheezing or rales.  Abdominal:     General: Bowel sounds are normal. There is no distension.     Tenderness: There is no abdominal tenderness.  Musculoskeletal:        General: No deformity.     Cervical back: Neck supple.     Right lower leg: No edema.     Left lower leg: No edema.  Skin:    General: Skin is warm and dry.     Capillary Refill: Capillary refill takes less than 2 seconds.  Neurological:     General: No focal deficit present.     Mental Status: She is alert. Mental status is at baseline.     Cranial Nerves: Cranial nerves are intact.     Sensory: Sensation is intact.     Motor: Motor function is intact.     Coordination:  Coordination is intact.     Comments: Sensation and motor function intact in the left arm  Psychiatric:        Mood and Affect: Mood normal.     ED Results / Procedures / Treatments   Labs (all labs ordered are listed, but only abnormal results are displayed) Labs Reviewed  BASIC METABOLIC PANEL - Abnormal; Notable for the following components:      Result Value   Sodium 133 (*)    CO2 21 (*)    Creatinine, Ser 1.38 (*)    GFR, Estimated 36 (*)    All other components within normal limits  CBC - Abnormal; Notable for the following components:   Hemoglobin 11.0 (*)    HCT 35.8 (*)    RDW 17.3 (*)    All other components within normal limits  TROPONIN I (HIGH SENSITIVITY) -  Abnormal; Notable for the following components:   Troponin I (High Sensitivity) 18 (*)    All other components within normal limits  TROPONIN I (HIGH SENSITIVITY) - Abnormal; Notable for the following components:   Troponin I (High Sensitivity) 21 (*)    All other components within normal limits  CBG MONITORING, ED  TROPONIN I (HIGH SENSITIVITY)    EKG EKG Interpretation  Date/Time:  Tuesday December 27 2019 09:26:40 EDT Ventricular Rate:  68 PR Interval:    QRS Duration: 95 QT Interval:  426 QTC Calculation: 454 R Axis:   72 Text Interpretation: Sinus rhythm Probable left atrial enlargement RSR' in V1 or V2, probably normal variant No significant change since last tracing Confirmed by Melene Plan [phone]) on 12/27/2019 9:40:03 AM   Radiology DG Chest 2 View  Result Date: 12/27/2019 CLINICAL DATA:  Chest pain EXAM: CHEST - 2 VIEW COMPARISON:  11/19/2018 chest radiograph and prior. FINDINGS: Cardiomediastinal silhouette is unchanged. Both lungs are clear. No pneumothorax or pleural effusion. No acute osseous abnormality. Right hemidiaphragm elevation is unchanged. IMPRESSION: No focal airspace disease. Electronically Signed   By: Stana Bunting M.D.   On: 12/27/2019 10:01    Procedures Procedures (including critical care time)  Medications Ordered in ED Medications  sodium chloride 0.9 % bolus 500 mL (500 mLs Intravenous New Bag/Given 12/27/19 1100)    ED Course  I have reviewed the triage vital signs and the nursing notes.  Pertinent labs & imaging results that were available during my care of the patient were reviewed by me and considered in my medical decision making (see chart for details).  Clinical Course as of Dec 26 1152  Tue Dec 27, 2019  0945 Creatinine(!): 1.38 [RS]    Clinical Course User Index [RS] Raney Antwine, Eugene Gavia, PA-C   MDM Rules/Calculators/A&P                          Concern for cardiac etiology of left arm numbness and tingling, in context  of significant CAD and cardiac history.  Patient is compliant with Eliquis, Plavix.   Troponin negative x3, 18, 21, 16 (third troponin obtained as patient's initial 2 troponins were drawn over 10 hours ago)  CBC with mild anemia 11.0, previously 9.3  BMP with AKI creatinine 1.38. - 500 mL bolus in context of reassuring cardiac workup.  Chest x-ray negative for acute cardiopulmonary disease.  EKG normal sinus rhythm, no acute change since prior.  Collateral history obtained over the phone from patient's son, Genise Strack (MD, neurologist in [place]).  According to the patient's son  the patient has been very depressed for about the last year.  He states the patient's daughter died last year of cancer, and then this year her "favorite brother "died.  Additionally her husband recently had a stroke, which has been stressful for her, her husband has mild left hemiparesis but is able to care for himself.  According to the patient's son the patient's confusion, forgetfulness has only started since her depression began over the last year.  He feels that if her depression were managed her confusion would significantly improve.  He states he is coming up to [facility] this afternoon.  He states that he tried to convince the patient to see psychiatry in the past, however she adamantly declined.  He states that this morning when he spoke to her she was agreeable to a psychiatry consult for the first time.  He states that she "loves to play the sick role" and is "making it all up".  At time of reevaluation of the patient, she is resting comfortably in her bed.  She is refusing to eat the food that we brought despite stating that she is hungry.  At this time she does endorse feeling very low, sad. She denies thoughts of harming herself, SI, HI.  At this time I do not feel an acute emergency department psychiatry consult is appropriate.  I discussed with patient's son, who voiced understanding.  Will provide them with  behavioral health contact information.  Patient's vital signs have remained stable throughout stay in the emergency department, she endorses persistent numbness in her left arm, however cardiac work-up is negative and her neurologic exam is completely normal.  Additionally patient has been evaluated multiple times for this exact same complaint, each time with negative work-up. At this time I do feel any further work-up is necessary in the emergency department.  Patient voiced understanding of her work-up and treatment plan.  Each of her questions were answered to her expressed infection.  Patient stable for discharge at this time.  Final Clinical Impression(s) / ED Diagnoses Final diagnoses:  AKI (acute kidney injury) [facility])    Rx / DC Orders ED Discharge Orders    None       Paris Lore, PA-C 12/27/19 1154    Melene Plan, DO 12/27/19 1158    Detravion Tester, Eugene Gavia, PA-C 12/27/19 1211    Alonte Wulff, Eugene Gavia, PA-C 12/27/19 1211    Melene Plan, DO 12/27/19 1214

## 2019-12-27 NOTE — ED Notes (Signed)
Can't get a good read on saturation. Hands are cold and pleth is not good. Will attempt to try another location. Pt A&O x 3 and states that she feels fine.

## 2019-12-27 NOTE — ED Notes (Signed)
Off floor to Xray

## 2019-12-28 ENCOUNTER — Other Ambulatory Visit: Payer: Self-pay | Admitting: Internal Medicine

## 2019-12-28 MED ORDER — AMLODIPINE BESYLATE 2.5 MG PO TABS: ORAL_TABLET | ORAL | 0 refills | Status: AC

## 2019-12-28 MED ORDER — APIXABAN 2.5 MG PO TABS: 5.0000 mg | ORAL_TABLET | Freq: Two times a day (BID) | ORAL | 3 refills | Status: AC

## 2019-12-28 NOTE — Telephone Encounter (Signed)
*  STAT* If patient is at the pharmacy, call can be transferred to refill team.   1. Which medications need to be refilled? (please list name of each medication and dose if known) apixaban (ELIQUIS) 5mg  amLODipine (NORVASC) 2.5 MG tablet  2. Which pharmacy/location (including street and city if local pharmacy) is medication to be sent to? [facility] - [place], [place] - [address]  3. Do they need a 30 day or 90 day supply? 30 day

## 2019-12-28 NOTE — Telephone Encounter (Signed)
Pt called requesting refills for eliquis and amlodipine. However, on medication list, it has been reported that pt is taking Eliquis 5 mg bid instead of 2.5 mg. I do not see in pt chart when this was increased.   Please review and advise.

## 2019-12-29 ENCOUNTER — Other Ambulatory Visit: Payer: Self-pay

## 2019-12-29 MED ORDER — ROSUVASTATIN CALCIUM 10 MG PO TABS: 10.0000 mg | ORAL_TABLET | Freq: Every evening | ORAL | 2 refills | Status: AC

## 2020-01-05 ENCOUNTER — Other Ambulatory Visit: Payer: Self-pay

## 2020-01-05 ENCOUNTER — Ambulatory Visit (INDEPENDENT_AMBULATORY_CARE_PROVIDER_SITE_OTHER): Payer: Medicare Other | Admitting: Internal Medicine

## 2020-01-05 VITALS — BP 108/72 | HR 60 | Ht 65.0 in | Wt 121.2 lb

## 2020-01-05 DIAGNOSIS — I10 Essential (primary) hypertension: Secondary | ICD-10-CM | POA: Diagnosis not present

## 2020-01-05 DIAGNOSIS — I214 Non-ST elevation (NSTEMI) myocardial infarction: Secondary | ICD-10-CM

## 2020-01-05 DIAGNOSIS — E785 Hyperlipidemia, unspecified: Secondary | ICD-10-CM

## 2020-01-05 DIAGNOSIS — I82402 Acute embolism and thrombosis of unspecified deep veins of left lower extremity: Secondary | ICD-10-CM

## 2020-01-05 DIAGNOSIS — I25119 Atherosclerotic heart disease of native coronary artery with unspecified angina pectoris: Secondary | ICD-10-CM

## 2020-01-05 DIAGNOSIS — I5032 Chronic diastolic (congestive) heart failure: Secondary | ICD-10-CM | POA: Diagnosis not present

## 2020-01-05 NOTE — Patient Instructions (Signed)
Medication Instructions:  No Changes In Medications at this time.  *If you need a refill on your cardiac medications before your next appointment, please call your pharmacy*  Lab Work: None Ordered At This Time.  If you have labs (blood work) drawn today and your tests are completely normal, you will receive your results only by: . MyChart Message (if you have MyChart) OR . A paper copy in the mail If you have any lab test that is abnormal or we need to change your treatment, we will call you to review the results.  Testing/Procedures: None Ordered At This Time.   Follow-Up: At [facility], you and your health needs are our priority.  As part of our continuing mission to provide you with exceptional heart care, we have created designated Provider Care Teams.  These Care Teams include your primary Cardiologist (physician) and Advanced Practice Providers (APPs -  Physician Assistants and Nurse Practitioners) who all work together to provide you with the care you need, when you need it.  We recommend signing up for the patient portal called "MyChart".  Sign up information is provided on this After Visit Summary.  MyChart is used to connect with patients for Virtual Visits (Telemedicine).  Patients are able to view lab/test results, encounter notes, upcoming appointments, etc.  Non-urgent messages can be sent to your provider as well.   To learn more about what you can do with MyChart, go to [email or web address].    Your next appointment:   1 year(s)  The format for your next appointment:   In Person  Provider:   Gayatri Acharya, MD   

## 2020-01-05 NOTE — Progress Notes (Signed)
Cardiology Office Note:    Date:  01/05/2020   ID:  [name], DOB 1935/12/15, MRN [id number]  PCP:  Laurann Montana, MD  Cardiologist:  Tonny Bollman, MD  Electrophysiologist:  None   Referring MD: Laurann Montana, MD   Chief Complaint/Reason for Referral: Coronary artery disease, chronic diastolic heart failure, hyperlipidemia.   History of Present Illness:    [name] is a 84 y.o. female with a history of coronary artery disease, chronic diastolic heart failure, hyperlipidemia. Presents for follow up and medication refills.  History: In June 2016 she had an NSTEMI treated with an attempted PCI of the right PDA, this was complicated by wire dissection and the case was aborted. Repeat catheterization showed normal left main, 60% proximal LAD, 90% septal perforator, 65% diagonal, 50% ramus intermedius, normal RCA with 90% right PDA and healed dissection. Preserved ejection fraction was noted. 1 year later she had repeat attempt at PCI of the PDA which was unsuccessful.    In September 2020 had an NSTEMI with acute left lower extremity DVT. High-sensitivity troponin peaked at 246. She underwent coronary angiography with attempts at stenting to the posterior lateral branch which was noted to be nearly subtotally occluded, it was unable to be crossed with a balloon, and medical therapy was recommended. She was continued on Plavix and Eliquis for coronary artery disease, NSTEMI, and newly recognized DVT. She had some neurologic symptoms with left arm numbness during hospitalization and brain MRI did not show evidence of stroke. MRI of the C-spine showed cervical disc disease and spinal stenosis.  Very unclear what prompted DVT, and therefore duration of therapy is challenging. Discussed this with her son [name] (neurologist in [place]) after the initial consultation, and will review ongoing, however at that time, continuation of therapy was agreed upon. She has had no bleeding  events. Long term therapy with DAPT was recommended due to advanced CAD. She is currently on Plavix and Eliquis (reduced dose).   She has memory issues and presents alone today. Her main concern is the temperature of the exam room - I agreed with her that it was quite cold. The temperature of the room made her tearful. She denies chest pain or SOB. Recently seen in ED for fatigue and L arm discomfort, without evidence of ACS. We discussed med titration however her BP is low and this may be difficult to do.  Past Medical History:  Diagnosis Date  . Abnormal nuclear stress test 05/24/2015  . Atypical ductal hyperplasia of right breast 02/22/2014  . C. difficile colitis - recurrent 06/23/2016  . C. difficile diarrhea on po vanc 10/04/2015  . CAD (coronary artery disease) 2006, 2016   a. 08/2014 NSTEMI: Attempted PCI of 85% RPDA - unable to wire, complicated by dissection, case aborted;  b. 08/2014 Relook Cath: LM nl, LAD 60p, SP1 90ost (small), D1 65ost, RI 45/50, RCA nl, RPDA 90 - healed dissection;  b. 08/2014 Echo: EF 55%, mild/mon midapical inferior HK c. Cath 05/2015 with CTO of the right-PDA and unsuccessful PCI  d.11/2018 uncess PTCA/stenting to PLOM.   [name] CAD S/P unsuccessful PCI 08/18/14 04/20/2007  . chronic back   . CKD (chronic kidney disease), stage II   . COLONIC POLYPS, ADENOMATOUS, HX OF   . Diverticulitis   . Dyslipidemia    a. statin intolerant.  [name] Dyspnea    with exertion  . Elevated troponin level 10/04/2015  . ENDOMETRIOSIS   . Essential hypertension   . Fibromyalgia   .  GASTRIC ANTRAL VASCULAR ECTASIA   . GERD   . History of hiatal hernia   . Hypertension, uncontrolled 10/04/2015  . Insomnia   . IRON DEFICIENCY ANEMIA SECONDARY TO BLOOD LOSS   . Myocardial infarction (HCC) 08/2014  . Neuropathy   . NSTEMI (non-ST elevated myocardial infarction) (HCC) 08/18/2014  . Osteoarthritis    "back, hips, basically all over" (05/24/2015)  . Palpitations 03/06/2011  . Paresthesia 08/01/2014  .  Raynaud's syndrome   . Rosacea, acne   . Sicca syndrome (HCC)   . SJOGREN'S SYNDROME   . SUPRAVENTRICULAR TACHYCARDIA   . Vitamin D deficiency     Past Surgical History:  Procedure Laterality Date  . ABDOMINAL HYSTERECTOMY     left right ovary  . APPENDECTOMY    . BREAST EXCISIONAL BIOPSY Right   . BREAST EXCISIONAL BIOPSY Right   . BREAST LUMPECTOMY WITH RADIOACTIVE SEED LOCALIZATION Right 01/24/2014   Procedure: BREAST LUMPECTOMY WITH RADIOACTIVE SEED LOCALIZATION;  Surgeon: Avel Peace, MD;  Location: [facility];  Service: General;  Laterality: Right;  . CARDIAC CATHETERIZATION N/A 08/18/2014   Procedure: Left Heart Cath and Coronary Angiography;  Surgeon: Marykay Lex, MD;  Location: [facility] INVASIVE CV LAB;  Service: Cardiovascular;  Laterality: N/A;  . CARDIAC CATHETERIZATION  08/18/2014   Procedure: Coronary Balloon Angioplasty;  Surgeon: Marykay Lex, MD;  Location: [facility] INVASIVE CV LAB;  Service: Cardiovascular;;  . CARDIAC CATHETERIZATION N/A 08/21/2014   Procedure: Left Heart Cath and Coronary Angiography;  Surgeon: Marykay Lex, MD;  Location: [facility] INVASIVE CV LAB;  Service: Cardiovascular;  Laterality: N/A;  . CARDIAC CATHETERIZATION  2006  . CARDIAC CATHETERIZATION N/A 05/24/2015   Procedure: Left Heart Cath and Coronary Angiography;  Surgeon: Tonny Bollman, MD;  Location: [facility] INVASIVE CV LAB;  Service: Cardiovascular;  Laterality: N/A;  . COLONOSCOPY W/ BIOPSIES AND POLYPECTOMY  12/17/2006   adenomatous polyps, external hemorrhoids  . CORONARY BALLOON ANGIOPLASTY N/A 11/23/2018   Procedure: CORONARY BALLOON ANGIOPLASTY;  Surgeon: Swaziland, Peter M, MD;  Location: [facility] INVASIVE CV LAB;  Service: Cardiovascular;  Laterality: N/A;  . ESOPHAGOGASTRODUODENOSCOPY  11/27/2009   APC tretment of lesions  . EYE SURGERY    . HERNIA REPAIR    . HIATAL HERNIA REPAIR  2005   and paraesophageal  . LAPAROSCOPIC CHOLECYSTECTOMY    . LEFT HEART CATH AND CORONARY ANGIOGRAPHY N/A  11/23/2018   Procedure: LEFT HEART CATH AND CORONARY ANGIOGRAPHY;  Surgeon: Swaziland, Peter M, MD;  Location: [facility] INVASIVE CV LAB;  Service: Cardiovascular;  Laterality: N/A;  . TEAR DUCT PROBING Left    "no plugs or anything"    Current Medications: Current Meds  Medication Sig  . acetaminophen (TYLENOL) 500 MG tablet Take 1,000 mg by mouth as needed.   [name] amLODipine (NORVASC) 2.5 MG tablet TAKE 1 2 (ONE HALF) TABLET BY MOUTH ONCE DAILY AND 1 AS NEEDED FOR SYSTOLIC BLOOD PRESSURE GREATER THAN 160  . apixaban (ELIQUIS) 2.5 MG TABS tablet Take 2 tablets (5 mg total) by mouth 2 (two) times daily. LF - 12/12/19 #60  . clopidogrel (PLAVIX) 75 MG tablet Take 1 tablet (75 mg total) by mouth daily. (Patient taking differently: Take 75 mg by mouth in the morning. LF - 12/09/19 #30)  . diclofenac Sodium (VOLTAREN) 1 % GEL Apply 4 g topically 4 (four) times daily.  . fluticasone (FLONASE ALLERGY RELIEF) 50 MCG/ACT nasal spray Place 1 spray into the nose daily as needed for allergies.  . furosemide (LASIX)  20 MG tablet Take 1 tablet (20 mg total) by mouth daily. (Patient taking differently: Take 20 mg by mouth every evening. LF - 12/09/19 #30)  . isosorbide mononitrate (IMDUR) 60 MG 24 hr tablet Take 60 mg by mouth every morning. LF - 12/09/19 #30  . metoprolol tartrate (LOPRESSOR) 25 MG tablet Take 0.5 tablets (12.5 mg total) by mouth 2 (two) times daily. (Patient taking differently: Take 12.5 mg by mouth 2 (two) times daily. LF - 9/24/221 #30)  . nitroGLYCERIN (NITROSTAT) 0.4 MG SL tablet Place 1 tablet (0.4 mg total) under the tongue every 5 (five) minutes as needed for chest pain.  . Polyvinyl Alcohol-Povidone (REFRESH OP) Place 1 drop into both eyes 2 (two) times daily.   . rosuvastatin (CRESTOR) 10 MG tablet Take 1 tablet (10 mg total) by mouth every evening.     Allergies:   Ezetimibe, Ezetimibe-simvastatin, Morphine, Rosuvastatin, Ambien [zolpidem tartrate], Plaquenil [hydroxychloroquine sulfate], Polytrim  [polymyxin b-trimethoprim], and Prevacid [lansoprazole]   Social History   Tobacco Use  . Smoking status: Never Smoker  . Smokeless tobacco: Never Used  Vaping Use  . Vaping Use: Never used  Substance Use Topics  . Alcohol use: Yes    Comment: occasional wine  . Drug use: No     Family History: The patient's family history includes Breast cancer in her daughter, sister, sister, and sister; Cancer in her daughter and sister; Coronary artery disease in her brother, brother, brother, father, and mother; Diabetes in her mother; Heart attack in her father; Heart disease in her brother, father, and mother; Heart failure in her mother; Hyperlipidemia in her mother; Hypertension in her father and mother; Kidney failure in her mother; Peripheral vascular disease in her mother. There is no history of Colon cancer, Esophageal cancer, Stomach cancer, or Rectal cancer.  ROS:   Please see the history of present illness.    All other systems reviewed and are negative.  EKGs/Labs/Other Studies Reviewed:    The following studies were reviewed today:  EKG:  ECG from ED shows SR and baseline wander 12/27/19.  Recent Labs: 12/26/2019: BUN 16; Creatinine, Ser 1.38; Hemoglobin 11.0; Platelets 211; Potassium 5.0; Sodium 133  Recent Lipid Panel    Component Value Date/Time   CHOL 192 11/20/2018 0651   TRIG 53 11/20/2018 0651   HDL 76 11/20/2018 0651   CHOLHDL 2.5 11/20/2018 0651   VLDL 11 11/20/2018 0651   LDLCALC 105 (H) 11/20/2018 0651    Physical Exam:    VS:  BP 108/72   Pulse 60   Ht 5\' 5"  (1.651 m)   Wt 121 lb 3.2 oz (55 kg)   BMI 20.17 kg/m     Wt Readings from Last 5 Encounters:  12/26/19 121 lb 4.1 oz (55 kg)  07/01/19 122 lb 3.2 oz (55.4 kg)  12/06/18 120 lb 12.8 oz (54.8 kg)  11/23/18 114 lb 6.4 oz (51.9 kg)  04/14/18 121 lb 6.4 oz (55.1 kg)    Constitutional: No acute distress Eyes: sclera non-icteric, normal conjunctiva and lids ENMT: mask in place Cardiovascular:  regular rhythm, normal rate, soft systolic murmur. S1 and S2 normal. No jugular venous distention.  Respiratory: clear to auscultation bilaterally GI : normal bowel sounds, soft and nontender. No distention.   MSK: extremities warm, well perfused. No edema.  NEURO: grossly nonfocal exam, moves all extremities. PSYCH: alert and oriented x 3, tearful at times.  ASSESSMENT:    1. Coronary artery disease involving native coronary artery of native heart  with angina pectoris (HCC)   2. Non-ST elevation (NSTEMI) myocardial infarction (HCC)   3. Chronic diastolic heart failure (HCC)   4. Essential hypertension   5. Deep vein thrombosis (DVT) of left lower extremity, unspecified chronicity, unspecified vein (HCC)   6. Hyperlipidemia LDL goal <70    PLAN:    CAD with hx of NSTEMI - indefinite antiplatelet therapy recommended. Not on ASA because of Eliquis. Continue Plavix 75 mg daily.  DVT - unclear if unprovoked, continue Eliquis 2.5 mg BID and reassess at each PCP and CV visit for bleeding risk.   HTN - hypotensive today. No adjustment to antianginal therapy at this time.   Total time of encounter: 30 minutes total time of encounter, including 25 minutes spent in face-to-face patient care on the date of this encounter. This time includes coordination of care and counseling regarding above mentioned problem list. Remainder of non-face-to-face time involved reviewing chart documents/testing relevant to the patient encounter and documentation in the medical record. I have independently reviewed documentation from referring provider.   Weston Brass, MD [place]  [facility]    Medication Adjustments/Labs and Tests Ordered: Current medicines are reviewed at length with the patient today.  Concerns regarding medicines are outlined above.   No orders of the defined types were placed in this encounter.   No orders of the defined types were placed in this encounter.   Patient  Instructions  Medication Instructions:  No Changes In Medications at this time.  *If you need a refill on your cardiac medications before your next appointment, please call your pharmacy*  Lab Work: None Ordered At This Time.  If you have labs (blood work) drawn today and your tests are completely normal, you will receive your results only by: [name] MyChart Message (if you have MyChart) OR . A paper copy in the mail If you have any lab test that is abnormal or we need to change your treatment, we will call you to review the results.  Testing/Procedures: None Ordered At This Time.   Follow-Up: At [facility], you and your health needs are our priority.  As part of our continuing mission to provide you with exceptional heart care, we have created designated Provider Care Teams.  These Care Teams include your primary Cardiologist (physician) and Advanced Practice Providers (APPs -  Physician Assistants and Nurse Practitioners) who all work together to provide you with the care you need, when you need it.  We recommend signing up for the patient portal called "MyChart".  Sign up information is provided on this After Visit Summary.  MyChart is used to connect with patients for Virtual Visits (Telemedicine).  Patients are able to view lab/test results, encounter notes, upcoming appointments, etc.  Non-urgent messages can be sent to your provider as well.   To learn more about what you can do with MyChart, go to [email or web address].    Your next appointment:   1 year(s)  The format for your next appointment:   In Person  Provider:   Weston Brass, MD

## 2020-01-07 ENCOUNTER — Other Ambulatory Visit: Payer: Self-pay | Admitting: Internal Medicine

## 2020-01-20 ENCOUNTER — Telehealth: Payer: Self-pay | Admitting: Internal Medicine

## 2020-01-20 NOTE — Telephone Encounter (Signed)
   Pt c/o Shortness Of Breath: STAT if SOB developed within the last 24 hours or pt is noticeably SOB on the phone  1. Are you currently SOB (can you hear that pt is SOB on the phone)? yes  2. How long have you been experiencing SOB? Started yesterday   3. Are you SOB when sitting or when up moving around? Up and moving around   4. Are you currently experiencing any other symptoms? Pain.  Pt said she is in a tremendous amount of pain in her chest. She said the pain starts by her R Breast and goes through her back. Her Son advised her to come in to see the Doctor to get some relief. She said she tried to go to the ER but waitied in the waiting room for 10 hours. She does not want to do that again.

## 2020-01-27 ENCOUNTER — Other Ambulatory Visit: Payer: Self-pay | Admitting: Internal Medicine

## 2020-01-31 ENCOUNTER — Other Ambulatory Visit: Payer: Self-pay | Admitting: Internal Medicine

## 2020-02-20 ENCOUNTER — Other Ambulatory Visit: Payer: Self-pay | Admitting: Internal Medicine

## 2020-04-02 ENCOUNTER — Ambulatory Visit: Payer: Medicare Other | Admitting: Neurology

## 2020-04-04 DIAGNOSIS — I5032 Chronic diastolic (congestive) heart failure: Secondary | ICD-10-CM | POA: Diagnosis not present

## 2020-04-04 DIAGNOSIS — K219 Gastro-esophageal reflux disease without esophagitis: Secondary | ICD-10-CM | POA: Diagnosis not present

## 2020-04-04 DIAGNOSIS — I5033 Acute on chronic diastolic (congestive) heart failure: Secondary | ICD-10-CM | POA: Diagnosis not present

## 2020-04-04 DIAGNOSIS — E785 Hyperlipidemia, unspecified: Secondary | ICD-10-CM | POA: Diagnosis not present

## 2020-04-04 DIAGNOSIS — I10 Essential (primary) hypertension: Secondary | ICD-10-CM | POA: Diagnosis not present

## 2020-04-04 DIAGNOSIS — D509 Iron deficiency anemia, unspecified: Secondary | ICD-10-CM | POA: Diagnosis not present

## 2020-04-04 DIAGNOSIS — I129 Hypertensive chronic kidney disease with stage 1 through stage 4 chronic kidney disease, or unspecified chronic kidney disease: Secondary | ICD-10-CM | POA: Diagnosis not present

## 2020-04-04 DIAGNOSIS — I25119 Atherosclerotic heart disease of native coronary artery with unspecified angina pectoris: Secondary | ICD-10-CM | POA: Diagnosis not present

## 2020-04-04 DIAGNOSIS — G4700 Insomnia, unspecified: Secondary | ICD-10-CM | POA: Diagnosis not present

## 2020-04-04 DIAGNOSIS — I214 Non-ST elevation (NSTEMI) myocardial infarction: Secondary | ICD-10-CM | POA: Diagnosis not present

## 2020-05-01 DIAGNOSIS — S32810A Multiple fractures of pelvis with stable disruption of pelvic ring, initial encounter for closed fracture: Secondary | ICD-10-CM | POA: Diagnosis not present

## 2020-05-01 DIAGNOSIS — W1839XA Other fall on same level, initial encounter: Secondary | ICD-10-CM | POA: Diagnosis not present

## 2020-05-01 DIAGNOSIS — I2510 Atherosclerotic heart disease of native coronary artery without angina pectoris: Secondary | ICD-10-CM | POA: Diagnosis not present

## 2020-05-01 DIAGNOSIS — S0990XA Unspecified injury of head, initial encounter: Secondary | ICD-10-CM | POA: Diagnosis not present

## 2020-05-01 DIAGNOSIS — N1831 Chronic kidney disease, stage 3a: Secondary | ICD-10-CM | POA: Diagnosis not present

## 2020-05-01 DIAGNOSIS — Z20822 Contact with and (suspected) exposure to covid-19: Secondary | ICD-10-CM | POA: Diagnosis not present

## 2020-05-01 DIAGNOSIS — I130 Hypertensive heart and chronic kidney disease with heart failure and stage 1 through stage 4 chronic kidney disease, or unspecified chronic kidney disease: Secondary | ICD-10-CM | POA: Diagnosis not present

## 2020-05-01 DIAGNOSIS — E785 Hyperlipidemia, unspecified: Secondary | ICD-10-CM | POA: Diagnosis not present

## 2020-05-01 DIAGNOSIS — K047 Periapical abscess without sinus: Secondary | ICD-10-CM | POA: Diagnosis not present

## 2020-05-01 DIAGNOSIS — I5032 Chronic diastolic (congestive) heart failure: Secondary | ICD-10-CM | POA: Diagnosis not present

## 2020-05-01 DIAGNOSIS — Z043 Encounter for examination and observation following other accident: Secondary | ICD-10-CM | POA: Diagnosis not present

## 2020-05-01 DIAGNOSIS — S73102A Unspecified sprain of left hip, initial encounter: Secondary | ICD-10-CM | POA: Diagnosis not present

## 2020-05-01 DIAGNOSIS — Z79899 Other long term (current) drug therapy: Secondary | ICD-10-CM | POA: Diagnosis not present

## 2020-05-01 DIAGNOSIS — M25552 Pain in left hip: Secondary | ICD-10-CM | POA: Diagnosis not present

## 2020-05-01 DIAGNOSIS — Z888 Allergy status to other drugs, medicaments and biological substances status: Secondary | ICD-10-CM | POA: Diagnosis not present

## 2020-05-01 DIAGNOSIS — S32592A Other specified fracture of left pubis, initial encounter for closed fracture: Secondary | ICD-10-CM | POA: Diagnosis not present

## 2020-05-01 DIAGNOSIS — R2689 Other abnormalities of gait and mobility: Secondary | ICD-10-CM | POA: Diagnosis not present

## 2020-05-01 DIAGNOSIS — E876 Hypokalemia: Secondary | ICD-10-CM | POA: Diagnosis not present

## 2020-05-01 DIAGNOSIS — Z885 Allergy status to narcotic agent status: Secondary | ICD-10-CM | POA: Diagnosis not present

## 2020-05-01 DIAGNOSIS — Z8679 Personal history of other diseases of the circulatory system: Secondary | ICD-10-CM | POA: Diagnosis not present

## 2020-05-01 DIAGNOSIS — Z7902 Long term (current) use of antithrombotics/antiplatelets: Secondary | ICD-10-CM | POA: Diagnosis not present

## 2020-05-01 DIAGNOSIS — Z86718 Personal history of other venous thrombosis and embolism: Secondary | ICD-10-CM | POA: Diagnosis not present

## 2020-05-01 DIAGNOSIS — R0989 Other specified symptoms and signs involving the circulatory and respiratory systems: Secondary | ICD-10-CM | POA: Diagnosis not present

## 2020-05-01 DIAGNOSIS — E860 Dehydration: Secondary | ICD-10-CM | POA: Diagnosis not present

## 2020-05-02 DIAGNOSIS — S32592A Other specified fracture of left pubis, initial encounter for closed fracture: Secondary | ICD-10-CM | POA: Diagnosis not present

## 2020-05-03 DIAGNOSIS — S3289XA Fracture of other parts of pelvis, initial encounter for closed fracture: Secondary | ICD-10-CM | POA: Diagnosis not present

## 2020-05-03 DIAGNOSIS — R0989 Other specified symptoms and signs involving the circulatory and respiratory systems: Secondary | ICD-10-CM | POA: Diagnosis not present

## 2020-05-03 DIAGNOSIS — I2510 Atherosclerotic heart disease of native coronary artery without angina pectoris: Secondary | ICD-10-CM | POA: Diagnosis not present

## 2020-05-03 DIAGNOSIS — I10 Essential (primary) hypertension: Secondary | ICD-10-CM | POA: Diagnosis not present

## 2020-05-03 DIAGNOSIS — S025XXA Fracture of tooth (traumatic), initial encounter for closed fracture: Secondary | ICD-10-CM | POA: Diagnosis not present

## 2020-05-04 DIAGNOSIS — S025XXA Fracture of tooth (traumatic), initial encounter for closed fracture: Secondary | ICD-10-CM | POA: Diagnosis not present

## 2020-05-04 DIAGNOSIS — I10 Essential (primary) hypertension: Secondary | ICD-10-CM | POA: Diagnosis not present

## 2020-05-04 DIAGNOSIS — S3289XA Fracture of other parts of pelvis, initial encounter for closed fracture: Secondary | ICD-10-CM | POA: Diagnosis not present

## 2020-05-04 DIAGNOSIS — I2510 Atherosclerotic heart disease of native coronary artery without angina pectoris: Secondary | ICD-10-CM | POA: Diagnosis not present

## 2020-05-04 DIAGNOSIS — N1831 Chronic kidney disease, stage 3a: Secondary | ICD-10-CM | POA: Diagnosis not present

## 2020-05-08 DIAGNOSIS — S329XXA Fracture of unspecified parts of lumbosacral spine and pelvis, initial encounter for closed fracture: Secondary | ICD-10-CM | POA: Diagnosis not present

## 2020-05-08 DIAGNOSIS — K047 Periapical abscess without sinus: Secondary | ICD-10-CM | POA: Diagnosis not present

## 2020-05-08 DIAGNOSIS — Z7901 Long term (current) use of anticoagulants: Secondary | ICD-10-CM | POA: Diagnosis not present

## 2020-05-08 DIAGNOSIS — I10 Essential (primary) hypertension: Secondary | ICD-10-CM | POA: Diagnosis not present

## 2020-05-08 DIAGNOSIS — I2510 Atherosclerotic heart disease of native coronary artery without angina pectoris: Secondary | ICD-10-CM | POA: Diagnosis not present

## 2020-05-14 DIAGNOSIS — K219 Gastro-esophageal reflux disease without esophagitis: Secondary | ICD-10-CM | POA: Diagnosis not present

## 2020-05-14 DIAGNOSIS — I5032 Chronic diastolic (congestive) heart failure: Secondary | ICD-10-CM | POA: Diagnosis not present

## 2020-05-14 DIAGNOSIS — I129 Hypertensive chronic kidney disease with stage 1 through stage 4 chronic kidney disease, or unspecified chronic kidney disease: Secondary | ICD-10-CM | POA: Diagnosis not present

## 2020-05-14 DIAGNOSIS — G4700 Insomnia, unspecified: Secondary | ICD-10-CM | POA: Diagnosis not present

## 2020-05-14 DIAGNOSIS — I214 Non-ST elevation (NSTEMI) myocardial infarction: Secondary | ICD-10-CM | POA: Diagnosis not present

## 2020-05-14 DIAGNOSIS — I10 Essential (primary) hypertension: Secondary | ICD-10-CM | POA: Diagnosis not present

## 2020-05-14 DIAGNOSIS — I25119 Atherosclerotic heart disease of native coronary artery with unspecified angina pectoris: Secondary | ICD-10-CM | POA: Diagnosis not present

## 2020-05-14 DIAGNOSIS — E785 Hyperlipidemia, unspecified: Secondary | ICD-10-CM | POA: Diagnosis not present

## 2020-05-14 DIAGNOSIS — N183 Chronic kidney disease, stage 3 unspecified: Secondary | ICD-10-CM | POA: Diagnosis not present

## 2020-05-14 DIAGNOSIS — D509 Iron deficiency anemia, unspecified: Secondary | ICD-10-CM | POA: Diagnosis not present

## 2020-05-16 DIAGNOSIS — M6281 Muscle weakness (generalized): Secondary | ICD-10-CM | POA: Diagnosis not present

## 2020-05-16 DIAGNOSIS — R2689 Other abnormalities of gait and mobility: Secondary | ICD-10-CM | POA: Diagnosis not present

## 2020-05-16 DIAGNOSIS — Z9181 History of falling: Secondary | ICD-10-CM | POA: Diagnosis not present

## 2020-05-17 DIAGNOSIS — R2689 Other abnormalities of gait and mobility: Secondary | ICD-10-CM | POA: Diagnosis not present

## 2020-05-17 DIAGNOSIS — Z9181 History of falling: Secondary | ICD-10-CM | POA: Diagnosis not present

## 2020-05-17 DIAGNOSIS — M6281 Muscle weakness (generalized): Secondary | ICD-10-CM | POA: Diagnosis not present

## 2020-05-21 DIAGNOSIS — R2689 Other abnormalities of gait and mobility: Secondary | ICD-10-CM | POA: Diagnosis not present

## 2020-05-21 DIAGNOSIS — Z9181 History of falling: Secondary | ICD-10-CM | POA: Diagnosis not present

## 2020-05-21 DIAGNOSIS — M6281 Muscle weakness (generalized): Secondary | ICD-10-CM | POA: Diagnosis not present

## 2020-05-22 DIAGNOSIS — K047 Periapical abscess without sinus: Secondary | ICD-10-CM | POA: Diagnosis not present

## 2020-05-22 DIAGNOSIS — I2510 Atherosclerotic heart disease of native coronary artery without angina pectoris: Secondary | ICD-10-CM | POA: Diagnosis not present

## 2020-05-22 DIAGNOSIS — S32810D Multiple fractures of pelvis with stable disruption of pelvic ring, subsequent encounter for fracture with routine healing: Secondary | ICD-10-CM | POA: Diagnosis not present

## 2020-05-23 DIAGNOSIS — Z9181 History of falling: Secondary | ICD-10-CM | POA: Diagnosis not present

## 2020-05-23 DIAGNOSIS — R2689 Other abnormalities of gait and mobility: Secondary | ICD-10-CM | POA: Diagnosis not present

## 2020-05-23 DIAGNOSIS — M6281 Muscle weakness (generalized): Secondary | ICD-10-CM | POA: Diagnosis not present

## 2020-05-25 DIAGNOSIS — M6281 Muscle weakness (generalized): Secondary | ICD-10-CM | POA: Diagnosis not present

## 2020-05-25 DIAGNOSIS — Z9181 History of falling: Secondary | ICD-10-CM | POA: Diagnosis not present

## 2020-05-25 DIAGNOSIS — R2689 Other abnormalities of gait and mobility: Secondary | ICD-10-CM | POA: Diagnosis not present

## 2020-05-29 DIAGNOSIS — R2689 Other abnormalities of gait and mobility: Secondary | ICD-10-CM | POA: Diagnosis not present

## 2020-05-29 DIAGNOSIS — Z9181 History of falling: Secondary | ICD-10-CM | POA: Diagnosis not present

## 2020-05-29 DIAGNOSIS — M6281 Muscle weakness (generalized): Secondary | ICD-10-CM | POA: Diagnosis not present

## 2020-05-30 DIAGNOSIS — I10 Essential (primary) hypertension: Secondary | ICD-10-CM | POA: Diagnosis not present

## 2020-05-31 DIAGNOSIS — R2689 Other abnormalities of gait and mobility: Secondary | ICD-10-CM | POA: Diagnosis not present

## 2020-05-31 DIAGNOSIS — M6281 Muscle weakness (generalized): Secondary | ICD-10-CM | POA: Diagnosis not present

## 2020-05-31 DIAGNOSIS — Z9181 History of falling: Secondary | ICD-10-CM | POA: Diagnosis not present

## 2020-06-01 DIAGNOSIS — M6281 Muscle weakness (generalized): Secondary | ICD-10-CM | POA: Diagnosis not present

## 2020-06-01 DIAGNOSIS — R2689 Other abnormalities of gait and mobility: Secondary | ICD-10-CM | POA: Diagnosis not present

## 2020-06-01 DIAGNOSIS — Z9181 History of falling: Secondary | ICD-10-CM | POA: Diagnosis not present

## 2020-06-05 DIAGNOSIS — Z9181 History of falling: Secondary | ICD-10-CM | POA: Diagnosis not present

## 2020-06-05 DIAGNOSIS — M6281 Muscle weakness (generalized): Secondary | ICD-10-CM | POA: Diagnosis not present

## 2020-06-05 DIAGNOSIS — R2689 Other abnormalities of gait and mobility: Secondary | ICD-10-CM | POA: Diagnosis not present

## 2020-06-05 DIAGNOSIS — R220 Localized swelling, mass and lump, head: Secondary | ICD-10-CM | POA: Diagnosis not present

## 2020-06-06 DIAGNOSIS — R2689 Other abnormalities of gait and mobility: Secondary | ICD-10-CM | POA: Diagnosis not present

## 2020-06-06 DIAGNOSIS — Z9181 History of falling: Secondary | ICD-10-CM | POA: Diagnosis not present

## 2020-06-06 DIAGNOSIS — M6281 Muscle weakness (generalized): Secondary | ICD-10-CM | POA: Diagnosis not present

## 2020-06-08 DIAGNOSIS — M6281 Muscle weakness (generalized): Secondary | ICD-10-CM | POA: Diagnosis not present

## 2020-06-08 DIAGNOSIS — R2689 Other abnormalities of gait and mobility: Secondary | ICD-10-CM | POA: Diagnosis not present

## 2020-06-08 DIAGNOSIS — Z9181 History of falling: Secondary | ICD-10-CM | POA: Diagnosis not present

## 2020-06-11 DIAGNOSIS — Z9181 History of falling: Secondary | ICD-10-CM | POA: Diagnosis not present

## 2020-06-11 DIAGNOSIS — M2041 Other hammer toe(s) (acquired), right foot: Secondary | ICD-10-CM | POA: Diagnosis not present

## 2020-06-11 DIAGNOSIS — B351 Tinea unguium: Secondary | ICD-10-CM | POA: Diagnosis not present

## 2020-06-11 DIAGNOSIS — M79674 Pain in right toe(s): Secondary | ICD-10-CM | POA: Diagnosis not present

## 2020-06-11 DIAGNOSIS — R262 Difficulty in walking, not elsewhere classified: Secondary | ICD-10-CM | POA: Diagnosis not present

## 2020-06-11 DIAGNOSIS — R2689 Other abnormalities of gait and mobility: Secondary | ICD-10-CM | POA: Diagnosis not present

## 2020-06-11 DIAGNOSIS — M2042 Other hammer toe(s) (acquired), left foot: Secondary | ICD-10-CM | POA: Diagnosis not present

## 2020-06-11 DIAGNOSIS — M79675 Pain in left toe(s): Secondary | ICD-10-CM | POA: Diagnosis not present

## 2020-06-11 DIAGNOSIS — I739 Peripheral vascular disease, unspecified: Secondary | ICD-10-CM | POA: Diagnosis not present

## 2020-06-11 DIAGNOSIS — M6281 Muscle weakness (generalized): Secondary | ICD-10-CM | POA: Diagnosis not present

## 2020-06-13 DIAGNOSIS — R2689 Other abnormalities of gait and mobility: Secondary | ICD-10-CM | POA: Diagnosis not present

## 2020-06-13 DIAGNOSIS — R5381 Other malaise: Secondary | ICD-10-CM | POA: Diagnosis not present

## 2020-06-13 DIAGNOSIS — Z9181 History of falling: Secondary | ICD-10-CM | POA: Diagnosis not present

## 2020-06-13 DIAGNOSIS — I10 Essential (primary) hypertension: Secondary | ICD-10-CM | POA: Diagnosis not present

## 2020-06-13 DIAGNOSIS — M6281 Muscle weakness (generalized): Secondary | ICD-10-CM | POA: Diagnosis not present

## 2020-06-14 DIAGNOSIS — R2689 Other abnormalities of gait and mobility: Secondary | ICD-10-CM | POA: Diagnosis not present

## 2020-06-14 DIAGNOSIS — M6281 Muscle weakness (generalized): Secondary | ICD-10-CM | POA: Diagnosis not present

## 2020-06-14 DIAGNOSIS — Z9181 History of falling: Secondary | ICD-10-CM | POA: Diagnosis not present

## 2020-06-15 DIAGNOSIS — Z9181 History of falling: Secondary | ICD-10-CM | POA: Diagnosis not present

## 2020-06-15 DIAGNOSIS — M6281 Muscle weakness (generalized): Secondary | ICD-10-CM | POA: Diagnosis not present

## 2020-06-15 DIAGNOSIS — R2689 Other abnormalities of gait and mobility: Secondary | ICD-10-CM | POA: Diagnosis not present

## 2020-06-19 DIAGNOSIS — R1112 Projectile vomiting: Secondary | ICD-10-CM | POA: Diagnosis not present

## 2020-06-19 DIAGNOSIS — K5909 Other constipation: Secondary | ICD-10-CM | POA: Diagnosis not present

## 2020-06-19 DIAGNOSIS — R198 Other specified symptoms and signs involving the digestive system and abdomen: Secondary | ICD-10-CM | POA: Diagnosis not present

## 2020-06-19 DIAGNOSIS — Z7189 Other specified counseling: Secondary | ICD-10-CM | POA: Diagnosis not present

## 2020-06-21 DIAGNOSIS — N390 Urinary tract infection, site not specified: Secondary | ICD-10-CM | POA: Diagnosis not present

## 2020-07-03 DIAGNOSIS — R634 Abnormal weight loss: Secondary | ICD-10-CM | POA: Diagnosis not present

## 2020-07-03 DIAGNOSIS — Q8789 Other specified congenital malformation syndromes, not elsewhere classified: Secondary | ICD-10-CM | POA: Diagnosis not present

## 2020-07-15 DEATH — deceased

## 2020-07-26 ENCOUNTER — Ambulatory Visit: Payer: Medicare Other | Admitting: Neurology
